# Patient Record
Sex: Female | Born: 1975 | Race: Black or African American | Hispanic: No | Smoking: Former smoker
Health system: Southern US, Community
[De-identification: ages and names within clinical notes are randomized; demographics above are authoritative.]

## PROBLEM LIST (undated history)

## (undated) ENCOUNTER — Inpatient Hospital Stay (HOSPITAL_COMMUNITY): Payer: Medicare Other

## (undated) ENCOUNTER — Emergency Department (HOSPITAL_COMMUNITY): Admission: EM | Payer: Medicare Other | Source: Home / Self Care

## (undated) DIAGNOSIS — I2510 Atherosclerotic heart disease of native coronary artery without angina pectoris: Secondary | ICD-10-CM

## (undated) DIAGNOSIS — M2550 Pain in unspecified joint: Secondary | ICD-10-CM

## (undated) DIAGNOSIS — E876 Hypokalemia: Secondary | ICD-10-CM

## (undated) DIAGNOSIS — K5900 Constipation, unspecified: Secondary | ICD-10-CM

## (undated) DIAGNOSIS — K829 Disease of gallbladder, unspecified: Secondary | ICD-10-CM

## (undated) DIAGNOSIS — M549 Dorsalgia, unspecified: Secondary | ICD-10-CM

## (undated) DIAGNOSIS — F1010 Alcohol abuse, uncomplicated: Secondary | ICD-10-CM

## (undated) DIAGNOSIS — R51 Headache: Secondary | ICD-10-CM

## (undated) DIAGNOSIS — F329 Major depressive disorder, single episode, unspecified: Secondary | ICD-10-CM

## (undated) DIAGNOSIS — G709 Myoneural disorder, unspecified: Secondary | ICD-10-CM

## (undated) DIAGNOSIS — R0602 Shortness of breath: Secondary | ICD-10-CM

## (undated) DIAGNOSIS — F32A Depression, unspecified: Secondary | ICD-10-CM

## (undated) DIAGNOSIS — R600 Localized edema: Secondary | ICD-10-CM

## (undated) DIAGNOSIS — F419 Anxiety disorder, unspecified: Secondary | ICD-10-CM

## (undated) DIAGNOSIS — Z9049 Acquired absence of other specified parts of digestive tract: Secondary | ICD-10-CM

## (undated) DIAGNOSIS — D649 Anemia, unspecified: Secondary | ICD-10-CM

## (undated) DIAGNOSIS — Z90721 Acquired absence of ovaries, unilateral: Secondary | ICD-10-CM

## (undated) HISTORY — DX: Atherosclerotic heart disease of native coronary artery without angina pectoris: I25.10

## (undated) HISTORY — DX: Constipation, unspecified: K59.00

## (undated) HISTORY — DX: Shortness of breath: R06.02

## (undated) HISTORY — DX: Pain in unspecified joint: M25.50

## (undated) HISTORY — DX: Disease of gallbladder, unspecified: K82.9

## (undated) HISTORY — DX: Localized edema: R60.0

## (undated) HISTORY — DX: Hypokalemia: E87.6

## (undated) HISTORY — DX: Dorsalgia, unspecified: M54.9

---
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
## 2001-02-02 ENCOUNTER — Encounter: Payer: Self-pay | Admitting: Internal Medicine

## 2001-02-03 ENCOUNTER — Emergency Department (HOSPITAL_COMMUNITY): Admission: EM | Admit: 2001-02-03 | Discharge: 2001-02-03 | Payer: Self-pay | Admitting: Emergency Medicine

## 2001-02-04 ENCOUNTER — Emergency Department (HOSPITAL_COMMUNITY): Admission: EM | Admit: 2001-02-04 | Discharge: 2001-02-04 | Payer: Self-pay | Admitting: Emergency Medicine

## 2001-02-20 ENCOUNTER — Emergency Department (HOSPITAL_COMMUNITY): Admission: EM | Admit: 2001-02-20 | Discharge: 2001-02-20 | Payer: Self-pay | Admitting: Emergency Medicine

## 2001-03-03 ENCOUNTER — Emergency Department (HOSPITAL_COMMUNITY): Admission: EM | Admit: 2001-03-03 | Discharge: 2001-03-03 | Payer: Self-pay | Admitting: *Deleted

## 2001-03-18 ENCOUNTER — Emergency Department (HOSPITAL_COMMUNITY): Admission: EM | Admit: 2001-03-18 | Discharge: 2001-03-18 | Payer: Self-pay | Admitting: Emergency Medicine

## 2001-03-18 ENCOUNTER — Encounter: Payer: Self-pay | Admitting: Emergency Medicine

## 2001-05-25 ENCOUNTER — Emergency Department (HOSPITAL_COMMUNITY): Admission: EM | Admit: 2001-05-25 | Discharge: 2001-05-25 | Payer: Self-pay | Admitting: Emergency Medicine

## 2001-09-15 ENCOUNTER — Emergency Department (HOSPITAL_COMMUNITY): Admission: EM | Admit: 2001-09-15 | Discharge: 2001-09-16 | Payer: Self-pay | Admitting: Unknown Physician Specialty

## 2002-07-30 ENCOUNTER — Emergency Department (HOSPITAL_COMMUNITY): Admission: EM | Admit: 2002-07-30 | Discharge: 2002-07-30 | Payer: Self-pay | Admitting: Emergency Medicine

## 2006-04-28 ENCOUNTER — Inpatient Hospital Stay (HOSPITAL_COMMUNITY): Admission: AD | Admit: 2006-04-28 | Discharge: 2006-04-28 | Payer: Self-pay | Admitting: Family Medicine

## 2006-05-01 ENCOUNTER — Inpatient Hospital Stay (HOSPITAL_COMMUNITY): Admission: AD | Admit: 2006-05-01 | Discharge: 2006-05-01 | Payer: Self-pay | Admitting: Obstetrics and Gynecology

## 2006-05-27 ENCOUNTER — Ambulatory Visit (HOSPITAL_COMMUNITY): Admission: RE | Admit: 2006-05-27 | Discharge: 2006-05-27 | Payer: Self-pay | Admitting: Obstetrics and Gynecology

## 2006-10-16 ENCOUNTER — Inpatient Hospital Stay (HOSPITAL_COMMUNITY): Admission: AD | Admit: 2006-10-16 | Discharge: 2006-10-16 | Payer: Self-pay | Admitting: Obstetrics and Gynecology

## 2006-12-15 ENCOUNTER — Inpatient Hospital Stay (HOSPITAL_COMMUNITY): Admission: AD | Admit: 2006-12-15 | Discharge: 2006-12-15 | Payer: Self-pay | Admitting: Obstetrics and Gynecology

## 2006-12-31 ENCOUNTER — Inpatient Hospital Stay (HOSPITAL_COMMUNITY): Admission: AD | Admit: 2006-12-31 | Discharge: 2007-01-03 | Payer: Self-pay | Admitting: Obstetrics and Gynecology

## 2007-01-01 ENCOUNTER — Encounter (INDEPENDENT_AMBULATORY_CARE_PROVIDER_SITE_OTHER): Payer: Self-pay | Admitting: Obstetrics and Gynecology

## 2007-05-26 ENCOUNTER — Emergency Department (HOSPITAL_COMMUNITY): Admission: EM | Admit: 2007-05-26 | Discharge: 2007-05-26 | Payer: Self-pay | Admitting: Emergency Medicine

## 2010-05-22 NOTE — Discharge Summary (Signed)
[name], Freeman         ACCOUNT NO.:  [id number]   MEDICAL RECORD NO.:  [id number]          PATIENT TYPE:  INP   LOCATION:  9148                          [facility]ILITY:  [facility]   PHYSICIAN:  Hal Morales, M.D.DATE OF BIRTH:  Apr 06, 1975   DATE OF ADMISSION:  12/31/2006  DATE OF DISCHARGE:  01/03/2007                               DISCHARGE SUMMARY   ATTENDING PHYSICIAN:  Hal Morales, M.D.   ADMITTING DIAGNOSES:  1. Intrauterine pregnancy at term.  2. Premature rupture of membranes at term.  3. Early labor.  4. Previous cesarean with desire for VBAC (vaginal birth after      cesarean).   DISCHARGE DIAGNOSES:  1. Intrauterine pregnancy at term.  2. Premature rupture of membranes at term.  3. Early labor.  4. Previous cesarean with desire for VBAC (vaginal birth after      cesarean).  5. Recurrent decelerations.   PROCEDURE:  Repeat cesarean section.   SURGEON:  Osborn Coho, M.D.   FIRST ASSISTANT:  Cam Hai, C.N.M.   HOSPITAL COURSE:  Ms. Reichenbach is a 35 year old gravida 3, para 2-0-  0-2 who was admitted at 39-2/7 weeks for evaluation of leaking vaginal  fluid.  She reported some irregular painful contractions.  She reports  positive fetal movement.  Her pregnancy has been followed by the [facility] OB/GY[facility] MD service and has been remarkable for  1. PENICILLIN allergy, 2) group B strep negative, 3) smoker, 4) father      of the baby with hemophilia, 5) Rh negative, 6) previous C-section      with a single layer closure and subsequent VBAC as well as desire      for VBAC with this pregnancy.   Upon admission her cervix was 2 cm, 75% effaced, vertex -2, speculum  exam shows positive pooling, positive Nitrazine and positive ferning.  She was afebrile.  Her vital signs were stable and fetal heart rate was  reactive upon admission.  An IUPC was placed at 9:30 p.m.  Pitocin was  started.  The patient was requesting an epidural at 10 p.m.   Vaginal  exam was unchanged at 53 cm, 80%, vertex -2.  At 11:45 p.m. the patient  was having recurrent decelerations.  Pitocin was stopped, at that point  vaginal exam was unchanged.  C-section was recommended to the patient  due to minimal cervical change combined with recurrent fetal  decelerations.  The patient agreed to cesarean section and she was  prepared for the operating room.  The infant was a viable female with  Apgars of 8 at one minute and 9 at five minutes, weight was 7 pounds 4  ounces.  Anesthesia was failed epidural and then a spinal.  Infant was  doing well and taken to the full-term nursery.  The patient tolerated  the remainder of the cesarean section well and was taken to the recovery  room.  By postop day #1 her vital signs were stable.  She was afebrile.  Hemoglobin was 10.0 and had been 12.0 preoperatively.  Her physical exam  was within normal limits.  She had a CT scan  on December 25 to evaluate  for ureteral injury which was negative so no further radiology studies  were needed in that area.  By postop day #2 she continued to do well.  Her vital signs were stable.  She was breast and bottle feeding and that  was going well.  She was desiring early discharge.  She was deemed to  have received full benefit of her hospital stay and was discharged home.   DISCHARGE INSTRUCTIONS:  Per [facility] handout.   DISCHARGE MEDICATIONS:  Motrin 600 mg one p.o. q.6 h p.r.n. pain, Tylox  one to two p.o. q.3-4 h p.r.n. pain, prenatal vitamin one p.o. daily.   DISCHARGE FOLLOWUP:  Will occur at [facility] in 6 weeks or  as needed.      Cam Hai, C.N.M.      Hal Morales, M.D.  Electronically Signed    KS/MEDQ  D:  01/03/2007  T:  01/04/2007  Job:  [id number]

## 2010-05-22 NOTE — Op Note (Signed)
Haley Freeman, Haley Freeman         ACCOUNT NO.:  [id number]   MEDICAL RECORD NO.:  [id number]          PATIENT TYPE:  INP   LOCATION:  9148                          [facility]   PHYSICIAN:  Osborn Coho, M.D.   DATE OF BIRTH:  Nov 22, 1975   DATE OF PROCEDURE:  DATE OF DISCHARGE:                               OPERATIVE REPORT   PREOPERATIVE DIAGNOSES:  1. 39-3/7 weeks.  2. Spontaneous rupture of membranes.  3. Early labor.  4. Vaginal birth after cesarean section (VBAC).  5. Recurrent decelerations.   POSTOPERATIVE DIAGNOSES:  1. 39-3/7 weeks.  2. Spontaneous rupture of membranes.  3. Early labor.  4. Vaginal birth after cesarean section (VBAC).  5. Recurrent decelerations.   PROCEDURE:  Repeat cesarean section.   ANESTHESIA:  Failed epidural with subsequent placement of a spinal.   SURGEON:  Osborn Coho, M.D.   ASSISTANT:  Cam Hai, C.N.M.   FLUIDS:  2300 mL.   URINE OUTPUT:  200 mL.   ESTIMATED BLOOD LOSS:  700 mL.   COMPLICATIONS:  None.   FINDINGS:  Live female infant with Apgars of 8 at one minute and 9 at  five minutes, weighing 7 pounds 4 ounces.   PROCEDURE:  The patient was taken to the operating room, after risks,  benefits, and alternatives were reviewed with the patient.  The patient  verbalized understanding, consent signed and witnessed.  The patient was  attempted to achieve a surgical level via the epidural which was  unsuccessful on the patient's left and anesthesiologist decided to place  a spinal with good success.   The patient was then prepped and draped in the normal sterile fashion in  the supine position.  A Pfannenstiel skin incision was made at the site  of the prior scar and carried down to the underlying layer of fascia  with the scalpel.  The fascia was excised bilaterally in the midline  with the Bovie and extended bilaterally with the Mayo scissors.  Kocher  clamps were placed on the superior aspect of the fascial  incision and  the rectus muscle excised from the fascia.  The same was done on the  inferior aspect of the fascial incision.  The rectus muscle was  separated in the midline and the peritoneum entered bluntly and extended  manually.  The bladder blade was placed and bladder flap created with  the Metzenbaum scissors.  However, multiple adhesions were noted of the  bladder to the right aspect of the uterine incision, which was then made  and extended bluntly.  The infant was delivered without difficulty.  Cord clamped and cut and clindamycin administered.  The placenta was  removed via fundal massage and uterus cleared of all clots and debris.  The uterine incision was repaired with #0 Vicryl in a running locked  fashion.  A second imbricating layer was performed.  Of note, the right  aspect of the uterine incision was noted to extend deeply down beneath  the bladder which was repaired trying to avoid the bladder as much as  possible, but secondary to the scar tissue, it was somewhat difficult.  I plan  to obtain an IVP or CT scan to evaluate ureteral patency.   The intra-abdominal cavity was copiously irrigated and uterine incision  noted to be hemostatic.  The peritoneum was then repaired with 2-0  chromic in a running fashion.  The fascia was repaired with #0 Vicryl in  a running fashion.  The subcutaneous tissue was irrigated and made  hemostatic with the Bovie.  Three interrupted stitches of 2-0 plain were  used in subcutaneous tissue to reapproximate the tissue.  The skin was  closed with 3-0 Monocryl via a subcuticular stitch..  Half-inch Steri-  Strips were applied with Benzoin.  Sponge, lap and needle counts were  correct.   The patient tolerated the procedure well and was awaiting transfer to  the recovery room in good condition.      Osborn Coho, M.D.  Electronically Signed     AR/MEDQ  D:  01/01/2007  T:  01/01/2007  Job:  [id number]

## 2010-05-22 NOTE — H&P (Signed)
Haley Freeman, AHART         ACCOUNT NO.:  [id number]   MEDICAL RECORD NO.:  [id number]          PATIENT TYPE:  INP   LOCATION:  9148                          [facility]   PHYSICIAN:  Osborn Coho, M.D.   DATE OF BIRTH:  March 13, 1975   DATE OF ADMISSION:  12/31/2006  DATE OF DISCHARGE:                              HISTORY & PHYSICAL   This is a 35 year old gravida 3, para 2-0-2 at 41 and 2/7 weeks who  presents for evaluation of leaking since 3:00 p.m.  She has some  irregular contractions that are painful.  There is positive fetal  movement.  Pregnancy has been followed by Dr. Su Hilt and remarkable  for:  1. Penicillin allergy.  2. Group B strep negative.  3. Smoker.  4. Father of the baby with hemophilia.  5. Rh negative.  6. Previous C-section with a single layer closure with subsequent      VBAC.   ALLERGIES:  PENICILLIN CAUSES HER THROAT CLOSE UP.   OBSTETRIC HISTORY:  She had a C-section in 1999 of a female infant at 57  weeks' gestation weighing 7 pounds 11 ounces for nonreassuring fetal  heart rate.  She had a VBAC in 2001 of a female infant at 4 weeks'  gestation weighing 7 pounds 9 ounces with no complications.   MEDICAL HISTORY:  Remarkable for history of anemia, Rh negative,  childhood varicella, and smoking cigarettes.   SURGICAL HISTORY:  Remarkable for C-section times 1.   FAMILY HISTORY:  Remarkable for heart disease and hypertension in  multiple members and aunt with diabetes.  Mother with thyroid  dysfunction.   GENETIC HISTORY:  Remarkable for daughters born with heart murmurs that  went away.  Father of the baby with hemophilia and family history of  twins.   SOCIAL HISTORY:  The patient is married to [employer] who is involved  and supportive.  She is of the [employer].  She denies any alcohol or  drug use, but does smoke cigarettes, half pack per day.   PRENATAL LABORATORY:  Hemoglobin 11.8, platelets 262.  Hepatitis  negative,  rubella immune, RPR nonreactive.  Blood type O negative,  antibody screen negative, sickle cell negative, HIV negative.  Pap test  normal.  Gonorrhea, Chlamydia negative.   HISTORY OF CURRENT PREGNANCY:  The patient entered care at 7 weeks.  She  had a first trimester ultrasound to confirm dates.  She had genetic  counseling done.  She declined first trimester screen and cystic  fibrosis testing.  She had an 18-week ultrasound that was normal and  other one at 22 weeks to complete the anatomy screen that was normal.  She had a death in the family and was treated with some Xanax for a  short while.  She signed a VBAC consent form.  She had an ultrasound at  33 weeks showing 67 percentile growth and she presents today in early  labor.   OBJECTIVE DATA:  VITAL SIGNS:  Stable, afebrile.  HEENT: Within normal limits.  Thyroid normal, not enlarged.  CHEST: Clear to auscultation.  HEART: Regular rate and rhythm.  ABDOMEN:  Gravid, vertex.  Leopold's, EFM shows reactive fetal heart  rate with irregular contractions.  Cervix is 2, 75 and -2, vertex.  Speculum exam was positive for ruptured membranes.  EXTREMITIES:  Within normal limits.   ASSESSMENT:  1. Intrauterine pregnancy at 69 and 2/7 weeks.  2. Premature rupture of membranes at term.  3. Early labor.   PLAN:  1. Admit per Dr. Su Hilt.  2. The patient elects to proceed with trial of labor and VBAC.      Marie L. Williams, C.N.M.      Osborn Coho, M.D.  Electronically Signed    MLW/MEDQ  D:  12/31/2006  T:  01/01/2007  Job:  [id number]

## 2010-07-03 ENCOUNTER — Emergency Department (HOSPITAL_COMMUNITY)
Admission: EM | Admit: 2010-07-03 | Discharge: 2010-07-03 | Disposition: A | Discharge status: Home / Self Care | Payer: Self-pay | Attending: Emergency Medicine | Admitting: Emergency Medicine

## 2010-07-03 DIAGNOSIS — T148 Other injury of unspecified body region: Secondary | ICD-10-CM | POA: Insufficient documentation

## 2010-07-03 DIAGNOSIS — W57XXXA Bitten or stung by nonvenomous insect and other nonvenomous arthropods, initial encounter: Secondary | ICD-10-CM | POA: Insufficient documentation

## 2010-07-03 DIAGNOSIS — Y92009 Unspecified place in unspecified non-institutional (private) residence as the place of occurrence of the external cause: Secondary | ICD-10-CM | POA: Insufficient documentation

## 2010-07-03 DIAGNOSIS — L299 Pruritus, unspecified: Secondary | ICD-10-CM | POA: Insufficient documentation

## 2010-07-03 DIAGNOSIS — Z79899 Other long term (current) drug therapy: Secondary | ICD-10-CM | POA: Insufficient documentation

## 2010-10-03 LAB — POCT URINALYSIS DIP (DEVICE)
Hgb urine dipstick: NEGATIVE
Ketones, ur: NEGATIVE
Protein, ur: NEGATIVE
Specific Gravity, Urine: 1.025
Urobilinogen, UA: 0.2
pH: 6

## 2010-10-12 LAB — RH IMMUNE GLOB WKUP(>/=20WKS)(NOT WOMEN'S HOSP): Fetal Screen: NEGATIVE

## 2010-10-12 LAB — CBC
HCT: 35.6 — ABNORMAL LOW
Hemoglobin: 10 — ABNORMAL LOW
MCHC: 33.6
MCHC: 33.7
MCV: 85.8
Platelets: 162
Platelets: 199
RDW: 14.5

## 2010-10-12 LAB — RPR: RPR Ser Ql: NONREACTIVE

## 2010-10-15 LAB — URINALYSIS, ROUTINE W REFLEX MICROSCOPIC
Bilirubin Urine: NEGATIVE
Glucose, UA: NEGATIVE
Ketones, ur: NEGATIVE
Nitrite: NEGATIVE
Specific Gravity, Urine: 1.01
pH: 6

## 2010-10-18 LAB — RH IMMUNE GLOBULIN WORKUP (NOT WOMEN'S HOSP): Antibody Screen: NEGATIVE

## 2010-11-18 ENCOUNTER — Ambulatory Visit (HOSPITAL_COMMUNITY)
Admission: EM | Admit: 2010-11-18 | Discharge: 2010-11-18 | Disposition: A | Discharge status: Home / Self Care | Payer: No Typology Code available for payment source | Source: Home / Self Care | Attending: Psychiatry | Admitting: Psychiatry

## 2010-11-18 ENCOUNTER — Encounter: Payer: Self-pay | Admitting: Emergency Medicine

## 2010-11-18 ENCOUNTER — Emergency Department (HOSPITAL_COMMUNITY)
Admission: EM | Admit: 2010-11-18 | Discharge: 2010-11-19 | Disposition: A | Discharge status: Other Acute Inpatient Hospital | Payer: No Typology Code available for payment source | Source: Home / Self Care | Attending: Emergency Medicine | Admitting: Emergency Medicine

## 2010-11-18 DIAGNOSIS — F1020 Alcohol dependence, uncomplicated: Secondary | ICD-10-CM

## 2010-11-18 HISTORY — DX: Anxiety disorder, unspecified: F41.9

## 2010-11-18 HISTORY — DX: Alcohol abuse, uncomplicated: F10.10

## 2010-11-18 LAB — DIFFERENTIAL
Basophils Absolute: 0.1 10*3/uL (ref 0.0–0.1)
Basophils Relative: 1 % (ref 0–1)
Eosinophils Absolute: 0.4 10*3/uL (ref 0.0–0.7)
Eosinophils Relative: 4 % (ref 0–5)
Lymphs Abs: 5.2 10*3/uL — ABNORMAL HIGH (ref 0.7–4.0)
Neutrophils Relative %: 44 % (ref 43–77)

## 2010-11-18 LAB — CBC
MCH: 24.9 pg — ABNORMAL LOW (ref 26.0–34.0)
MCHC: 32.7 g/dL (ref 30.0–36.0)
MCV: 76.3 fL — ABNORMAL LOW (ref 78.0–100.0)
Platelets: 301 10*3/uL (ref 150–400)
RDW: 20.1 % — ABNORMAL HIGH (ref 11.5–15.5)

## 2010-11-18 LAB — BASIC METABOLIC PANEL
CO2: 22 mEq/L (ref 19–32)
Calcium: 9.3 mg/dL (ref 8.4–10.5)
Creatinine, Ser: 0.82 mg/dL (ref 0.50–1.10)
GFR calc non Af Amer: >90 mL/min (ref >90)
Glucose, Bld: 89 mg/dL (ref 70–99)

## 2010-11-18 LAB — RAPID URINE DRUG SCREEN, HOSP PERFORMED: Amphetamines: NOT DETECTED

## 2010-11-18 LAB — ETHANOL: Alcohol, Ethyl (B): 157 mg/dL — ABNORMAL HIGH (ref 0–11)

## 2010-11-18 MED ORDER — THERA M PLUS PO TABS: 1.0000 | ORAL_TABLET | Freq: Every day | ORAL | Status: DC

## 2010-11-18 MED ORDER — THIAMINE HCL 100 MG/ML IJ SOLN: 100.0000 mg | Freq: Every day | INTRAMUSCULAR | Status: DC

## 2010-11-18 MED ORDER — FOLIC ACID 1 MG PO TABS: 1.0000 mg | ORAL_TABLET | Freq: Every day | ORAL | Status: DC

## 2010-11-18 MED ORDER — VITAMIN B-1 100 MG PO TABS: 100.0000 mg | ORAL_TABLET | Freq: Every day | ORAL | Status: DC

## 2010-11-18 MED ORDER — SODIUM CHLORIDE 0.9 % IV BOLUS (SEPSIS)
500.0000 mL | Freq: Once | INTRAVENOUS | Status: AC
  Administered 2010-11-18: 500 mL via INTRAVENOUS

## 2010-11-18 MED ORDER — LORAZEPAM 1 MG PO TABS
1.0000 mg | ORAL_TABLET | Freq: Four times a day (QID) | ORAL | Status: DC | PRN
  Administered 2010-11-18: 1 mg via ORAL
  Filled 2010-11-18: qty 1

## 2010-11-18 MED ORDER — LORAZEPAM 2 MG/ML IJ SOLN: 1.0000 mg | Freq: Four times a day (QID) | INTRAMUSCULAR | Status: DC | PRN

## 2010-11-18 MED ORDER — THIAMINE HCL 100 MG/ML IJ SOLN
100.0000 mg | Freq: Once | INTRAMUSCULAR | Status: AC
  Administered 2010-11-18: 200 mg via INTRAVENOUS
  Filled 2010-11-18 (×2): qty 2

## 2010-11-18 MED ORDER — LORAZEPAM 2 MG/ML IJ SOLN
1.0000 mg | Freq: Once | INTRAMUSCULAR | Status: AC
  Administered 2010-11-18: 1 mg via INTRAVENOUS
  Filled 2010-11-18: qty 1

## 2010-11-18 NOTE — ED Notes (Signed)
Pt provided a happy meal 
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
## 2010-11-18 NOTE — ED Provider Notes (Signed)
History     CSN: [id number] Arrival date & time: 11/18/2010  6:50 PM   First MD Initiated Contact with Patient 11/18/10 1910      Chief Complaint  Patient presents with  . Panic Attack    (Consider location/radiation/quality/duration/timing/severity/associated sxs/prior treatment) HPI  Past Medical History  Diagnosis Date  . Alcohol abuse   . Anxiety     History reviewed. No pertinent past surgical history.  History reviewed. No pertinent family history.  History  Substance Use Topics  . Smoking status: Current Everyday Smoker  . Smokeless tobacco: Not on file  . Alcohol Use: Yes     Here for detox    OB History    Grav Para Term Preterm Abortions TAB SAB Ect Mult Living                  Review of Systems  Allergies  Penicillins  Home Medications  No current outpatient prescriptions on file.  BP 125/84  Pulse 4  Temp(Src) 98 F (36.7 C) (Oral)  Resp 20  SpO2 97%  LMP 11/11/2010  Physical Exam  ED Course  Procedures (including critical care time)  Labs Reviewed  CBC - Abnormal; Notable for the following:    WBC 11.1 (*)    MCV 76.3 (*)    MCH 24.9 (*)    RDW 20.1 (*)    All other components within normal limits  DIFFERENTIAL - Abnormal; Notable for the following:    Lymphocytes Relative 47 (*)    Lymphs Abs 5.2 (*)    All other components within normal limits  ETHANOL - Abnormal; Notable for the following:    Alcohol, Ethyl (B) 157 (*)    All other components within normal limits  BASIC METABOLIC PANEL  URINE RAPID DRUG SCREEN (HOSP PERFORMED)   No results found.   No diagnosis found.    MDM  Care assumed from Dr. Radford Pax.  Labs returned and were okay.  ACT team was consulted for possible alcohol detox.  Patient resting comfortable.  Was given ativan.          Geoffery Lyons, MD 11/19/10 [phone]

## 2010-11-18 NOTE — ED Notes (Signed)
Last ETOH intake today unknown time.

## 2010-11-18 NOTE — ED Notes (Signed)
Pt with visible tremors, with hands by side. EDP Delo informed. Ativan and ciwa protocol ordered.

## 2010-11-18 NOTE — ED Provider Notes (Signed)
History     CSN: [id number] Arrival date & time: 11/18/2010  6:50 PM   First MD Initiated Contact with Patient 11/18/10 1910      Chief Complaint  Patient presents with  . Panic Attack    (Consider location/radiation/quality/duration/timing/severity/associated sxs/prior treatment) HPI Patient presents to emergency room with request for detox from alcohol.  Patient has never undergone detox before.  Patient is a heavy drinker and drinks daily.  Her last drink was this afternoon around 3 to 4:00.  Patient has no known history of DTs but has had shakes before.  Patient has underlying history of anxiety.  Patient been diagnosed in the past with depression and has had thoughts of suicide before but that's been going on for many years and there is nothing new her she has no definitive plan.  Patient has no specific physical complaints. Past Medical History  Diagnosis Date  . Alcohol abuse   . Anxiety     History reviewed. No pertinent past surgical history.  History reviewed. No pertinent family history.  History  Substance Use Topics  . Smoking status: Current Everyday Smoker  . Smokeless tobacco: Not on file  . Alcohol Use: Yes     Here for detox    OB History    Grav Para Term Preterm Abortions TAB SAB Ect Mult Living                  Review of Systems  All other systems reviewed and are negative.    Allergies  Penicillins  Home Medications  No current outpatient prescriptions on file.  BP 125/84  Pulse 4  Temp(Src) 98 F (36.7 C) (Oral)  Resp 20  SpO2 97%  LMP 11/11/2010  Physical Exam  Nursing note and vitals reviewed. Constitutional: She is oriented to person, place, and time. She appears well-developed and well-nourished. No distress.  HENT:  Head: Normocephalic and atraumatic.  Eyes: Pupils are equal, round, and reactive to light.  Neck: Normal range of motion.  Cardiovascular: Normal rate and intact distal pulses.   Pulmonary/Chest: No  respiratory distress.  Abdominal: Normal appearance. She exhibits no distension.  Musculoskeletal: Normal range of motion.  Neurological: She is alert and oriented to person, place, and time. No cranial nerve deficit.  Skin: Skin is warm and dry. No rash noted.  Psychiatric: She has a normal mood and affect. Her behavior is normal.    ED Course  Procedures (including critical care time)  Labs Reviewed  CBC - Abnormal; Notable for the following:    WBC 11.1 (*)    MCV 76.3 (*)    MCH 24.9 (*)    RDW 20.1 (*)    All other components within normal limits  DIFFERENTIAL - Abnormal; Notable for the following:    Lymphocytes Relative 47 (*)    Lymphs Abs 5.2 (*)    All other components within normal limits  ETHANOL - Abnormal; Notable for the following:    Alcohol, Ethyl (B) 157 (*)    All other components within normal limits  BASIC METABOLIC PANEL  URINE RAPID DRUG SCREEN (HOSP PERFORMED)   No results found.   1. Alcoholism /alcohol abuse       MDM       Plan is for IV fluids, Ativan, thiamine.  Will refer to behavioral health for admission for detox.  Nelia Shi, MD 11/18/10 [phone]

## 2010-11-18 NOTE — ED Notes (Signed)
Patient is requesting DETOX from ETOH, last drink was today.  Patient states that she did drink beer and liquor, amount "a lot."  Patient "states that she needs help because she has three kids at home that she needs to take care of."  Patient is restless, anxious, but very cooperative, makes eye contact.

## 2010-11-18 NOTE — ED Notes (Signed)
Pt reports here for detox from alcohol. Pt also reports having a panic attack that began the moment she decided to detox.

## 2010-11-18 NOTE — ED Notes (Signed)
Comfort measures, warm blanket provided

## 2010-11-19 ENCOUNTER — Inpatient Hospital Stay (HOSPITAL_COMMUNITY)
Admission: EM | Admit: 2010-11-19 | Discharge: 2010-11-22 | DRG: 897 | Disposition: A | Discharge status: Home / Self Care | Payer: No Typology Code available for payment source | Source: Ambulatory Visit | Attending: Psychiatry | Admitting: Psychiatry

## 2010-11-19 ENCOUNTER — Telehealth (HOSPITAL_COMMUNITY): Payer: Self-pay | Admitting: Licensed Clinical Social Worker

## 2010-11-19 ENCOUNTER — Encounter (HOSPITAL_COMMUNITY): Payer: Self-pay | Admitting: *Deleted

## 2010-11-19 DIAGNOSIS — F1994 Other psychoactive substance use, unspecified with psychoactive substance-induced mood disorder: Secondary | ICD-10-CM

## 2010-11-19 DIAGNOSIS — F410 Panic disorder [episodic paroxysmal anxiety] without agoraphobia: Secondary | ICD-10-CM

## 2010-11-19 DIAGNOSIS — G4700 Insomnia, unspecified: Secondary | ICD-10-CM

## 2010-11-19 DIAGNOSIS — R45851 Suicidal ideations: Secondary | ICD-10-CM

## 2010-11-19 DIAGNOSIS — F1020 Alcohol dependence, uncomplicated: Principal | ICD-10-CM

## 2010-11-19 DIAGNOSIS — F411 Generalized anxiety disorder: Secondary | ICD-10-CM

## 2010-11-19 DIAGNOSIS — F1014 Alcohol abuse with alcohol-induced mood disorder: Secondary | ICD-10-CM

## 2010-11-19 DIAGNOSIS — F4310 Post-traumatic stress disorder, unspecified: Secondary | ICD-10-CM

## 2010-11-19 DIAGNOSIS — G8929 Other chronic pain: Secondary | ICD-10-CM

## 2010-11-19 DIAGNOSIS — F329 Major depressive disorder, single episode, unspecified: Secondary | ICD-10-CM

## 2010-11-19 HISTORY — DX: Depression, unspecified: F32.A

## 2010-11-19 HISTORY — DX: Headache: R51

## 2010-11-19 HISTORY — DX: Myoneural disorder, unspecified: G70.9

## 2010-11-19 HISTORY — DX: Major depressive disorder, single episode, unspecified: F32.9

## 2010-11-19 MED ORDER — THERA M PLUS PO TABS
1.0000 | ORAL_TABLET | Freq: Every day | ORAL | Status: DC
  Administered 2010-11-19 – 2010-11-22 (×4): 1 via ORAL
  Filled 2010-11-19 (×5): qty 1

## 2010-11-19 MED ORDER — CHLORDIAZEPOXIDE HCL 25 MG PO CAPS
25.0000 mg | ORAL_CAPSULE | ORAL | Status: AC
  Administered 2010-11-21 (×2): 25 mg via ORAL
  Filled 2010-11-19 (×2): qty 1

## 2010-11-19 MED ORDER — MAGNESIUM HYDROXIDE 400 MG/5ML PO SUSP: 30.0000 mL | Freq: Every day | ORAL | Status: DC | PRN

## 2010-11-19 MED ORDER — ONDANSETRON 4 MG PO TBDP
4.0000 mg | ORAL_TABLET | Freq: Four times a day (QID) | ORAL | Status: AC | PRN
  Administered 2010-11-19: 4 mg via ORAL
  Filled 2010-11-19: qty 1

## 2010-11-19 MED ORDER — NICOTINE 21 MG/24HR TD PT24
21.0000 mg | MEDICATED_PATCH | Freq: Every day | TRANSDERMAL | Status: DC
  Administered 2010-11-19 – 2010-11-21 (×3): 21 mg via TRANSDERMAL
  Filled 2010-11-19 (×4): qty 1

## 2010-11-19 MED ORDER — LOPERAMIDE HCL 2 MG PO CAPS
2.0000 mg | ORAL_CAPSULE | ORAL | Status: AC | PRN
  Administered 2010-11-20: 4 mg via ORAL

## 2010-11-19 MED ORDER — CHLORDIAZEPOXIDE HCL 25 MG PO CAPS
25.0000 mg | ORAL_CAPSULE | Freq: Every day | ORAL | Status: AC
  Administered 2010-11-22: 25 mg via ORAL
  Filled 2010-11-19: qty 1

## 2010-11-19 MED ORDER — CHLORDIAZEPOXIDE HCL 25 MG PO CAPS
50.0000 mg | ORAL_CAPSULE | Freq: Once | ORAL | Status: AC
  Administered 2010-11-19: 50 mg via ORAL
  Filled 2010-11-19: qty 2

## 2010-11-19 MED ORDER — CHLORDIAZEPOXIDE HCL 25 MG PO CAPS: 25.0000 mg | ORAL_CAPSULE | Freq: Four times a day (QID) | ORAL | Status: AC | PRN

## 2010-11-19 MED ORDER — VITAMIN B-1 100 MG PO TABS
100.0000 mg | ORAL_TABLET | Freq: Every day | ORAL | Status: DC
  Administered 2010-11-20 – 2010-11-22 (×3): 100 mg via ORAL
  Filled 2010-11-19 (×5): qty 1

## 2010-11-19 MED ORDER — ALUM & MAG HYDROXIDE-SIMETH 200-200-20 MG/5ML PO SUSP
30.0000 mL | ORAL | Status: DC | PRN
  Administered 2010-11-21: 30 mL via ORAL

## 2010-11-19 MED ORDER — MIRTAZAPINE 15 MG PO TABS
7.5000 mg | ORAL_TABLET | Freq: Every day | ORAL | Status: DC
  Administered 2010-11-19 – 2010-11-20 (×2): 7.5 mg via ORAL
  Filled 2010-11-19: qty 0.5
  Filled 2010-11-19: qty 1
  Filled 2010-11-19: qty 0.5
  Filled 2010-11-19: qty 1

## 2010-11-19 MED ORDER — ACETAMINOPHEN 325 MG PO TABS: 650.0000 mg | ORAL_TABLET | Freq: Four times a day (QID) | ORAL | Status: DC | PRN

## 2010-11-19 MED ORDER — CHLORDIAZEPOXIDE HCL 25 MG PO CAPS
25.0000 mg | ORAL_CAPSULE | Freq: Four times a day (QID) | ORAL | Status: AC
  Administered 2010-11-19 (×4): 25 mg via ORAL
  Filled 2010-11-19 (×4): qty 1

## 2010-11-19 MED ORDER — CHLORPROMAZINE HCL 25 MG PO TABS
25.0000 mg | ORAL_TABLET | Freq: Three times a day (TID) | ORAL | Status: DC
  Administered 2010-11-19 – 2010-11-21 (×7): 25 mg via ORAL
  Filled 2010-11-19 (×8): qty 1

## 2010-11-19 MED ORDER — HYDROXYZINE PAMOATE 25 MG PO CAPS
25.0000 mg | ORAL_CAPSULE | Freq: Four times a day (QID) | ORAL | Status: AC | PRN
  Administered 2010-11-19: 25 mg via ORAL

## 2010-11-19 MED ORDER — CHLORDIAZEPOXIDE HCL 25 MG PO CAPS
25.0000 mg | ORAL_CAPSULE | Freq: Three times a day (TID) | ORAL | Status: AC
  Administered 2010-11-20 (×3): 25 mg via ORAL
  Filled 2010-11-19 (×3): qty 1

## 2010-11-19 NOTE — Progress Notes (Signed)
[facility] Group Notes:  (Counselor/Nursing/MHT/Case Management/Adjunct)  11/19/2010 2:26 PM  Type of Therapy:  Group Therapy  Participation Level:  Active  Participation Quality:  Appropriate, Attentive and Sharing  Affect:  Appropriate  Cognitive:  Appropriate  Insight:  Good  Engagement in Group:  Good  Engagement in Therapy:  Good  Modes of Intervention:  Problem-solving, Support and exploration   Summary of Progress/Problems: Pt was able to share that she came to the hospital for SI and alcohol abuse. Pt shared she is unhappy with life, pt stated her sister and the lifestyle in the home combined with unemployment are triggers. Pt stated she felt that the walls are coming in on her and that feeling causes her to drink, pt states she hates the cycle. Pt is fearful she will lose the love and respect of her children.   Purcell Nails 11/19/2010, 2:26 PM

## 2010-11-19 NOTE — Progress Notes (Signed)
[facility] Group Notes:  (Counselor/Nursing/MHT/Case Management/Adjunct)  11/19/2010 4:37 PM  Type of Therapy:  Group Therapy  Participation Level:  Active  Participation Quality:  Appropriate, Sharing and Supportive  Affect:  Appropriate  Cognitive:  Alert and Appropriate  Insight:  Good  Engagement in Group:  Good  Engagement in Therapy:  Good  Modes of Intervention:  Problem-solving, Role-play and Support  Summary of Progress/Problems: Haley Freeman was able to share that her motivations for change are out of the love she has for her children and how she wants them to grow up better then she did, pt feels guilt about her oldest daughter often taking care of her younger siblings because she is too drunk to do it herself. Pt expressed interest in moving out of sisters house with her three children 3,11, and 12 to a treatment center, pt says the place she lives now is full of drinking and drugs. Pt expressed motivation for turning her life around.    Purcell Nails 11/19/2010, 4:37 PM

## 2010-11-19 NOTE — Progress Notes (Signed)
SW met with pt individually on this date.  Pt was open with sharing reason for entering the hospital.  Pt states that she lives with a house full of alcoholics.  Pt states that she resides with her sister and her fiance and her three children, ages 9, 80 and 7.  Pt states that she recently moved from [place] to be closer to family and is getting unemployment for her source of income.  Pt states that she moved in with her sister from [place].  Pt states that she has depression, anxiety and insomnia and couldn't get her medication here so she resorted to coping with alcohol.  Pt states that her depression and anxiety are currently at a 9.  Pt states she is SI but contracts for safety.  Pt states that she doesn't want to drink anymore and can't go back to living at that home because she will drink again; pt states there is alcohol everywhere.  Pt requesting a shelter that will accept her and the children.  SW will assess for appropriate referrals.  Pt states that she is unable to do long term treatment because she needs to care for her children.  Pt was pleasant and appears motivated to stay sober and improve her mood.  Pt states she plans to attend all groups to get as much as she can while here.    Haley Freeman, LCSWA 11/19/2010  3:45 PM
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
## 2010-11-19 NOTE — Progress Notes (Signed)
Suicide Risk Assessment  Discharge Assessment     Demographic factors:    Current Mental Status:    Risk Reduction Factors:     CLINICAL FACTORS:   Severe Anxiety and/or Agitation Panic Attacks Depression:   Comorbid alcohol abuse/dependence Alcohol/Substance Abuse/Dependencies Chronic Pain Previous Psychiatric Diagnoses and Treatments  COGNITIVE FEATURES THAT CONTRIBUTE TO RISK:      SUICIDE RISK:   Mild:  Suicidal ideation of limited frequency, intensity, duration, and specificity.  There are no identifiable plans, no associated intent, mild dysphoria and related symptoms, good self-control (both objective and subjective assessment), few other risk factors, and identifiable protective factors, including available and accessible social support. Patient notes off and on suicidal thoughts and thoughts that everybody seems to be out to hurt her. Patient denies homicidal ideation, hallucinations, illusions, or delusions. Patient engages with good eye contact, is able to focus adequately in a one to one setting, and has clear goal directed thoughts. Patient speaks with a natural conversational volume, rate, and tone. Anxiety was reported at 7 on a scale of 1 the least and 10 the most. Depression was reported at 10 on the same scale. Patient is oriented times 4, recent and remote memory intact. Judgement: able to see that she needs help Insight: able to see that she needs help Plan: Medical detox from alcohol with low dose Librium, try Thorazine for anxiety management, and Remeron for insomnia ELOS: 3 to 5 days.    PLAN OF CARE:  Orson Aloe 11/19/2010, 4:09 PM

## 2010-11-19 NOTE — Progress Notes (Signed)
Patient is nauseated this am.  She was admitted for ETOH abuse.  Patient stated that she had been taking xanax and she was substituting the ETOH for same.  She is having withdrawal symptoms of stomach upset, shakiness, and anxiety.  Patient was given loading dose of librium 50 mg and scheduled 25 mg.  She was given prn zofran for nausea.  She denies any SI/HI/AVH.  Patient has been lying in bed this am due to her symptoms.  Continue to assess and maintain safety.  Patient is pleasant and cooperative.  She is alert and oriented; good eye contact.  Her thought process is intact.  She did report that she wanted to go home to be with her children, but she knew that she needed to be here.  Patient to remain on CIWA per librium protocol.

## 2010-11-19 NOTE — ED Notes (Signed)
Accepted by Dr. Dan Humphreys at [facility] - dispo set  Vida Roller, MD 11/19/10 [phone]

## 2010-11-19 NOTE — Progress Notes (Signed)
  Pt was in the hallway at the time of this assessment. Pt denies any SI/HI,AVH, or pain. Pt c/o having a big bowel prior to this assessment, that was diarrhea. Pt c/o insomnia often. Pt did not request any additional PRN's. Support was given, Q15 checks continue, pt remains safe on the unit.

## 2010-11-19 NOTE — ED Notes (Signed)
Pt A&Ox4, no neuro deficits

## 2010-11-20 ENCOUNTER — Encounter (HOSPITAL_COMMUNITY): Payer: Self-pay | Admitting: Psychiatry

## 2010-11-20 DIAGNOSIS — F1014 Alcohol abuse with alcohol-induced mood disorder: Secondary | ICD-10-CM | POA: Diagnosis present
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
## 2010-11-20 NOTE — Progress Notes (Signed)
Recreation Therapy Group Note  Date: 11/20/2010         Time: 1000       Group Topic/Focus: Patient invited to participate in animal assisted therapy. Pets as a coping skill and responsibility were discussed.   Participation Level: Did Not Attend  Participation Quality: Not Applicable  Affect: Not Applicable  Cognitive: Not Applicable   Additional Comments: Patient declined pet therapy.

## 2010-11-20 NOTE — Progress Notes (Signed)
[facility] Group Notes:  (Counselor/Nursing/MHT/Case Management/Adjunct)  11/20/2010 12:57 PM  Type of Therapy:  Group Therapy  Participation Level:  Active  Participation Quality:  Appropriate, Attentive, Sharing and Supportive  Affect:  Appropriate  Cognitive:  Appropriate  Insight:  Good  Engagement in Group:  Good  Engagement in Therapy:  Good  Modes of Intervention:  Problem-solving, Support and exploration  Summary of Progress/Problems: Dezire  Was able to share that she does not buy into labels as she is not just a dx but a person and feels she can beat her addiction. Pt also shared she was feeling conflicted as she wants to be here but she also worries about her children- however she feels her aunt will care for them. Pt able to provide support and positive feedback to others.   Purcell Nails 11/20/2010, 12:57 PM

## 2010-11-20 NOTE — Progress Notes (Signed)
Patient is attending groups.  She has good eye contact; thought processes intact.  Patient is interested in [facility] for support after discharge.  Patient has seven children in the home.  Patient's sister and her children live in the home also.  The patient's 35 yo daughter has been responsible for taking care of the younger children when the patient has been abusing substances.  The patient's home is not appropriate for the children as there are others living in the home that are using drugs.  Patient states that she is staying in her sister's home and she wants to move out and gain her sobriety.  Patient remains on librium protocol and has minimal withdrawal symptoms.  She rates her depression as a 7 and her hopelessness as a 5.  Continue to assess and maintain safety.

## 2010-11-20 NOTE — Tx Team (Signed)
Interdisciplinary Treatment Plan Update (Adult)  Date:  11/20/2010  Time Reviewed:  11:31 AM   Progress in Treatment: Attending groups: Yes Participating in groups:  Yes Taking medication as prescribed: Yes Tolerating medication:  Yes Family/Significant othe contact made:  Counselor assessing for appropriate contact Patient understands diagnosis:  Yes Discussing patient identified problems/goals with staff:  Yes Medical problems stabilized or resolved:  Yes Denies suicidal/homicidal ideation: Yes Issues/concerns per patient self-inventory:  None identified Other: N/A  New problem(s) identified: None Identified  Reason for Continuation of Hospitalization: Anxiety Depression Medication stabilization Suicidal ideation Withdrawal symptoms  Interventions implemented related to continuation of hospitalization: mood stabilization, medication monitoring and adjustment, group therapy and psycho education, safety checks q 15 mins  Additional comments: N/A  Estimated length of stay: 3-5 days  Discharge Plan: Pt will go to AA meetings and outpt for med management and therapy, pt requesting placement with kids due to unsafe environment  New goal(s): N/A  Review of initial/current patient goals per problem list:   1.  Goal(s): Substance Use  Met:  No  Target date: by discharge  As evidenced by: complete detox protocol and refer to treatment  2.  Goal (s): Depression  Met:  No  Target date: by discharge  As evidenced by: Reduce depression from a 10 to a 3  3.  Goal(s): Anxiety   Met:  No  Target date: by discharge  As evidenced by: Reduce anxiety from a 10 to a 3  4.  Goal(s):  Met:  No  Target date: by discharge  As evidenced by:   Attendees: Patient:     Family:     Physician:  Orson Aloe, MD 11/20/2010 11:31 AM   Nursing:   Joslyn Devon, RN 11/20/2010 11:31 AM   Case Manager:  Reyes Ivan, LCSWA 11/20/2010  11:31 AM   Counselor:  Ronda Fairly, LCSWA 11/20/2010  11:31 AM   Other:  Richardo Hanks, RN 11/20/2010 11:31 AM   Other:     Other:     Other:      Scribe for Treatment Team:   Carmina Miller, 11/20/2010 11:31 AM

## 2010-11-20 NOTE — Progress Notes (Signed)
  Pt is laying in bed resting, with eyes closed. Respirations are even and unlabored.

## 2010-11-20 NOTE — Progress Notes (Signed)
  Psychiatric Admission Assessment Adult  Patient Identification:  Haley Freeman Date of Evaluation: November 13th, 2012   History of Present Illness:: Here to get help with alcohol , drinking too much, self medicating. Having panic attacks. Began to have withdrawal symptoms, tremors, after drinking and felt she needed help. Suicidal thinking no intent. Wants to get well for her children. Drinking up to 1/2 gallon of Vodka.  Having poor sleep and no changes in appetite. Has been off Zoloft and Seroquel and Xanax off medications  since June.  Past Psychiatric History: Diagnosis: Post Partum Depression, Panic disorder  Hospitalizations:  Outpatient Care:      Suicidal Attempts: hx of cutting  Violent Behaviors: none   Past Medical History:     Past Medical History  Diagnosis Date  . Alcohol abuse   . Anxiety   . Headache   . Neuromuscular disorder   . Depression        Past Surgical History  Procedure Date  . Cesarean section '99 and '08    x 2    Allergies:  Allergies  Allergen Reactions  . Penicillins     Swells throat    Current Medications:  Prior to Admission medications   Not on File    Social History:    reports that she has been smoking.  She does not have any smokeless tobacco history on file. She reports that she drinks about 3 ounces of alcohol per week. She reports that she uses illicit drugs (Other-see comments). has 3 children ages 110, 79 and 3 that are with her sister. Currently separated and unemployed. Prior work was Lawyer.   Family History:    No family history on file.  Mental Status Examination/Evaluation: Objective:  Appearance: Disheveled  Eye Contact::  Fair  Speech:  Normal Rate  Volume:  Normal  Mood:    Affect:  Congruent  Thought Process:  Logical  Orientation:  Full    Suicidal Thoughts:  No  Homicidal Thoughts:  No  Judgement:  Intact  Insight:  Good  Psychomotor Activity:  Normal              AXIS I  Alcohol abuse. Depressive Disorder  AXIS II deferred  AXIS III Eczema, Bronchitis  AXIS IV Problems with work, financial   AXIS V 40     Treatment Plan Summary: Monitor withdrawal symptoms, assess comorbitites, assess motivation for rehab and sobriety AHLUWALIA,SHAMSHER S 11/6/201211:40 AM

## 2010-11-20 NOTE — Progress Notes (Signed)
[facility] MD Progress Note  11/20/2010 5:05 PM  Diagnosis:  Axis I: Substance Abuse  ADL's:  Intact  Sleep:  Yes,  AEB:  Appetite:  Yes,  AEB:  Suicidal Ideation:   Plan:  No  Intent:  No  Means:  No  Homicidal Ideation:   Plan:  No  Intent:  No  Means:  No  AEB (as evidenced by):  Mental Status: General Appearance Haley Freeman:  Casual Eye Contact:  Good Motor Behavior:  Normal Speech:  Normal Level of Consciousness:  Alert Mood:  Anxious Affect:  Tearful Anxiety Level:  Severe Thought Process:  Coherent Thought Content:  WNL Perception:  Normal Judgment:  Good Insight:  Present Cognition:  Orientation time, place and person Sleep:  Number of Hours: 6.25   Vital Signs:Blood pressure 103/71, pulse 93, temperature 98.3 F (36.8 C), temperature source Oral, resp. rate 16, height 5\' 8"  (1.727 m), weight 115.667 kg (255 lb), last menstrual period 11/11/2010.  Lab Results:  Results for orders placed during the hospital encounter of 11/18/10 (from the past 48 hour(s))  BASIC METABOLIC PANEL     Status: Normal   Collection Time   11/18/10  7:14 PM      Component Value Range Comment   Sodium 140  135 - 145 (mEq/L)    Potassium 3.8  3.5 - 5.1 (mEq/L)    Chloride 104  96 - 112 (mEq/L)    CO2 22  19 - 32 (mEq/L)    Glucose, Bld 89  70 - 99 (mg/dL)    BUN 9  6 - 23 (mg/dL)    Creatinine, Ser 9.56  0.50 - 1.10 (mg/dL)    Calcium 9.3  8.4 - 10.5 (mg/dL)    GFR calc non Af Amer >90  >90 (mL/min)    GFR calc Af Amer >90  >90 (mL/min)   CBC     Status: Abnormal   Collection Time   11/18/10  7:14 PM      Component Value Range Comment   WBC 11.1 (*) 4.0 - 10.5 (K/uL)    RBC 4.93  3.87 - 5.11 (MIL/uL)    Hemoglobin 12.3  12.0 - 15.0 (g/dL)    HCT 38.7  56.4 - 33.2 (%)    MCV 76.3 (*) 78.0 - 100.0 (fL)    MCH 24.9 (*) 26.0 - 34.0 (pg)    MCHC 32.7  30.0 - 36.0 (g/dL)    RDW 95.1 (*) 88.4 - 15.5 (%)    Platelets 301  150 - 400 (K/uL)   DIFFERENTIAL     Status: Abnormal     Collection Time   11/18/10  7:14 PM      Component Value Range Comment   Neutrophils Relative 44  43 - 77 (%)    Neutro Abs 4.8  1.7 - 7.7 (K/uL)    Lymphocytes Relative 47 (*) 12 - 46 (%)    Lymphs Abs 5.2 (*) 0.7 - 4.0 (K/uL)    Monocytes Relative 4  3 - 12 (%)    Monocytes Absolute 0.5  0.1 - 1.0 (K/uL)    Eosinophils Relative 4  0 - 5 (%)    Eosinophils Absolute 0.4  0.0 - 0.7 (K/uL)    Basophils Relative 1  0 - 1 (%)    Basophils Absolute 0.1  0.0 - 0.1 (K/uL)   ETHANOL     Status: Abnormal   Collection Time   11/18/10  7:14 PM      Component Value Range  Comment   Alcohol, Ethyl (B) 157 (*) 0 - 11 (mg/dL)   URINE RAPID DRUG SCREEN (HOSP PERFORMED)     Status: Normal   Collection Time   11/18/10  8:43 PM      Component Value Range Comment   Opiates NONE DETECTED  NONE DETECTED     Cocaine NONE DETECTED  NONE DETECTED     Benzodiazepines NONE DETECTED  NONE DETECTED     Amphetamines NONE DETECTED  NONE DETECTED     Tetrahydrocannabinol NONE DETECTED  NONE DETECTED     Barbiturates NONE DETECTED  NONE DETECTED      Physical Findings: AIMS:  , ,  ,  ,    CIWA:  CIWA-Ar Total: 0  COWS:     Treatment Plan Summary: Daily contact with patient to assess and evaluate symptoms and progress in treatment Medication management  Plan: Continue to monitor progress and encourage to enter residential tx.  Haley Freeman 11/20/2010, 5:05 PM

## 2010-11-21 MED ORDER — MIRTAZAPINE 15 MG PO TABS
15.0000 mg | ORAL_TABLET | Freq: Every day | ORAL | Status: DC
  Administered 2010-11-21: 15 mg via ORAL
  Filled 2010-11-21 (×2): qty 14
  Filled 2010-11-21: qty 1

## 2010-11-21 MED ORDER — QUETIAPINE FUMARATE 100 MG PO TABS
100.0000 mg | ORAL_TABLET | Freq: Every day | ORAL | Status: DC
  Administered 2010-11-21: 100 mg via ORAL
  Filled 2010-11-21 (×3): qty 1

## 2010-11-21 MED ORDER — QUETIAPINE FUMARATE 100 MG PO TABS
100.0000 mg | ORAL_TABLET | Freq: Every evening | ORAL | Status: DC | PRN
  Administered 2010-11-21: 100 mg via ORAL
  Filled 2010-11-21: qty 1

## 2010-11-21 NOTE — Progress Notes (Signed)
RN Shift 2300-0700 Pt is asleep at this time. Will interact with pt in morning prior to shift ending.
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
## 2010-11-21 NOTE — Progress Notes (Signed)
Pt attended discharge planning group this morning and actively participated. Pt stated that she was "doing better" today.  She was happy with the phone call with her brother yesterday, and reported him being a good support for her upon discharge.  Upon discharge she will live with her brother until she gets back on track.  PT rated her depression level at a two and her anxiety level at a zero.  Pt denies SI/HI.  SW will refer pt to mental health services to follow up with for treatment upon discharge. Haley Freeman Claudette Laws, [place] 11/21/2010  1:48 PM
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
## 2010-11-21 NOTE — Progress Notes (Signed)
  Patient polite and appropriate. Attended group this evening. Patient had concern this evening regarding remeron for sleep. Patient is scheduled remeron 7.5mg  PO at HS but states that night before, with taking medication, that she had a hard time falling asleep (though sleep record shows that patient slept 6.25hours). Nurse talked to patient and explained that she could call the MD on call but then patient said that she would rather take the scheduled medication tonight to see if she would have a better nights rest and then speak to the MD or NP tomorrow on the unit. Patient remains safe while on the unit.

## 2010-11-21 NOTE — Progress Notes (Signed)
[facility] Adult Inpatient Family/Significant Other Suicide Prevention Education  Suicide Prevention Education:  Education Completed; by Bekka's aunt Ms. Orpah Melter [phone] has been identified by the patient as the family member/significant other with whom the patient will be residing, and identified as the person(s) who will aid the patient in the event of a mental health crisis (suicidal ideations/suicide attempt).  With written consent from the patient, the family member/significant other has been provided the following suicide prevention education, prior to the and/or following the discharge of the patient.  The suicide prevention education provided includes the following:  Suicide risk factors  Suicide prevention and interventions  National Suicide Hotline telephone number  [facility] assessment telephone number  [facility] Emergency Assistance 911  [facility] and/or Residential Mobile Crisis Unit telephone number  Request made of family/significant other to:  Remove weapons (e.g., guns, rifles, knives), all items previously/currently identified as safety concern.    Remove drugs/medications (over-the-counter, prescriptions, illicit drugs), all items previously/currently identified as a safety concern.  The family member/significant other verbalizes understanding of the suicide prevention education information provided.  The family member/significant other agrees to remove the items of safety concern listed above. Aunt reports no weapons in home. Haley Freeman 11/21/2010, 4:20 PM

## 2010-11-21 NOTE — Progress Notes (Signed)
[facility] Group Notes:  (Counselor/Nursing/MHT/Case Management/Adjunct)  11/21/2010 5:39 PM  Type of Therapy:  Group Therapy  Participation Level:  Active  Participation Quality:  Appropriate  Affect:  Appropriate  Cognitive:  Appropriate  Insight:  Good  Engagement in Group:  Good  Engagement in Therapy:  Good  Modes of Intervention:  Problem-solving, Support and exploration  Summary of Progress/Problems: Pt states that when she is drinking she based her choices on emotions and is able to rational when sober. Pt able to provide support for others.   Haley Freeman 11/21/2010, 5:39 PM

## 2010-11-22 DIAGNOSIS — F10988 Alcohol use, unspecified with other alcohol-induced disorder: Secondary | ICD-10-CM

## 2010-11-22 MED ORDER — MIRTAZAPINE 15 MG PO TABS: 15.0000 mg | ORAL_TABLET | Freq: Every day | ORAL | Status: AC

## 2010-11-22 MED ORDER — NICOTINE 14 MG/24HR TD PT24
14.0000 mg | MEDICATED_PATCH | Freq: Every day | TRANSDERMAL | Status: DC
  Administered 2010-11-22: 14 mg via TRANSDERMAL
  Filled 2010-11-22 (×3): qty 1

## 2010-11-22 MED ORDER — QUETIAPINE FUMARATE 200 MG PO TABS: 200.0000 mg | ORAL_TABLET | Freq: Every day | ORAL | Status: AC

## 2010-11-22 NOTE — Progress Notes (Signed)
  See Discharge Suicide Assessment Note and Discharge Summary  Haley Freeman slept well last night on 200mg  of Seroquel.  She is completely detoxed.  She is in full contact with reality and plans to get home to get her 35yo daughter her immunizations so she can get her enrolled in preschool.    Feels medications are helping her.    Requests discharge today with followup as outpatient at [facility].
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
## 2010-11-22 NOTE — Progress Notes (Signed)
[facility] Group Notes:  (Counselor/Nursing/MHT/Case Management/Adjunct)  11/22/2010 3:22 PM  Type of Therapy:  Group therapy  Participation Level:  Active  Participation Quality:  Appropriate, Attentive, Sharing and Supportive  Affect:  Appropriate  Cognitive:  Appropriate  Insight:  Good  Engagement in Group:  Good  Engagement in Therapy:  Good  Modes of Intervention:  Problem-solving, Support and exploration  Summary of Progress/Problems: Pt was able to share what balance would look like in her life, stating it includes not allowing the bottom to fall out of things and taking charge of her life. Pt able to provide others with support and provide positive feedback.  Purcell Nails 11/22/2010, 3:22 PM

## 2010-11-22 NOTE — Progress Notes (Signed)
Suicide Risk Assessment  Discharge Assessment     Demographic factors:    Current Mental Status:    Risk Reduction Factors:     CLINICAL FACTORS:   Alcohol/Substance Abuse/Dependencies  COGNITIVE FEATURES THAT CONTRIBUTE TO RISK:  NONE    SUICIDE RISK:   Minimal: No identifiable suicidal ideation.  Patients presenting with no risk factors but with morbid ruminations; may be classified as minimal risk based on the severity of the depressive symptoms  Patient denies suicidal or homicidal ideation, hallucinations, illusions, or delusions.  Patient engages with good eye contact, is able to focus adequately in a one to one setting, and has clear goal directed thoughts.  Patient speaks with a natural conversational volume, rate, and tone.  Anxiety was reported at 1 on a scale of 1 the least and 10 the most.  Depression was reported also at 1 on the same scale.  Patient is oriented times 4, recent and remote memory intact.  Judgment: Improved from admission  Insight: Improved from admission  PLAN OF CARE: SEE DISCHARGE SUMMARY  Venkat Ankney S 11/22/2010, 11:11 AM
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
## 2010-11-22 NOTE — Discharge Summary (Signed)
Pt attended discharge planning group this morning and actively participated.  Pt stated that she felt great this morning and was ready to discharge today.  Pt does have transportation and will be living with her brother temporarily upon discharge.  Pt stated that she was not experiencing any symptoms related to depression or anxiety.  Pt denies SI/HI.  CM provided pt with information regarding her follow-up appt at [facility] as a walk-in tomorrow morning for medication management and the SAIOP program to help with the substance abuse issues.  CM ensured that pt was provided with a list of AA meetings in [place] and a AA book. Loraina Stauffer Claudette Laws, [place] 11/22/2010  2:39 PM

## 2010-11-22 NOTE — Progress Notes (Signed)
  Pt questioned the writer as to whether she was leaving tomorrow and whether or not she would be getting samples. Writer informed the pt that she would read the info. Informed the pt to ask the provider for needed meds. Support and encouragement  Were offered.

## 2010-11-23 NOTE — Progress Notes (Signed)
Patient Discharge Instructions:  Dictated admission note faxed, Date faxed:  11/23/2010 D/C instructions faxed, Date faxed:  11/23/2010 D/C Summary faxed, Date faxed:  11/23/2010 Med. Rec. Form faxed, Date faxed:  11/23/2010  Wandra Scot, 11/23/2010, 3:39 PM

## 2013-09-21 ENCOUNTER — Ambulatory Visit: Payer: Medicaid Other | Admitting: Obstetrics

## 2014-05-11 DIAGNOSIS — F319 Bipolar disorder, unspecified: Secondary | ICD-10-CM | POA: Diagnosis not present

## 2014-07-20 DIAGNOSIS — F319 Bipolar disorder, unspecified: Secondary | ICD-10-CM | POA: Diagnosis not present

## 2014-09-22 ENCOUNTER — Encounter (HOSPITAL_COMMUNITY): Payer: Self-pay | Admitting: General Practice

## 2014-09-22 ENCOUNTER — Emergency Department (HOSPITAL_COMMUNITY)
Admission: EM | Admit: 2014-09-22 | Discharge: 2014-09-22 | Disposition: A | Discharge status: Home / Self Care | Payer: Medicare Other | Attending: Emergency Medicine | Admitting: Emergency Medicine

## 2014-09-22 DIAGNOSIS — X58XXXA Exposure to other specified factors, initial encounter: Secondary | ICD-10-CM | POA: Insufficient documentation

## 2014-09-22 DIAGNOSIS — Z8669 Personal history of other diseases of the nervous system and sense organs: Secondary | ICD-10-CM | POA: Insufficient documentation

## 2014-09-22 DIAGNOSIS — Y929 Unspecified place or not applicable: Secondary | ICD-10-CM | POA: Diagnosis not present

## 2014-09-22 DIAGNOSIS — S39012A Strain of muscle, fascia and tendon of lower back, initial encounter: Secondary | ICD-10-CM | POA: Insufficient documentation

## 2014-09-22 DIAGNOSIS — Z8659 Personal history of other mental and behavioral disorders: Secondary | ICD-10-CM | POA: Diagnosis not present

## 2014-09-22 DIAGNOSIS — M549 Dorsalgia, unspecified: Secondary | ICD-10-CM | POA: Diagnosis present

## 2014-09-22 DIAGNOSIS — Y999 Unspecified external cause status: Secondary | ICD-10-CM | POA: Insufficient documentation

## 2014-09-22 DIAGNOSIS — M546 Pain in thoracic spine: Secondary | ICD-10-CM | POA: Diagnosis not present

## 2014-09-22 DIAGNOSIS — Y9389 Activity, other specified: Secondary | ICD-10-CM | POA: Insufficient documentation

## 2014-09-22 DIAGNOSIS — Z720 Tobacco use: Secondary | ICD-10-CM | POA: Diagnosis not present

## 2014-09-22 DIAGNOSIS — S29012A Strain of muscle and tendon of back wall of thorax, initial encounter: Secondary | ICD-10-CM | POA: Diagnosis not present

## 2014-09-22 DIAGNOSIS — Z880 Allergy status to penicillin: Secondary | ICD-10-CM | POA: Insufficient documentation

## 2014-09-22 DIAGNOSIS — Z3202 Encounter for pregnancy test, result negative: Secondary | ICD-10-CM | POA: Insufficient documentation

## 2014-09-22 DIAGNOSIS — T148XXA Other injury of unspecified body region, initial encounter: Secondary | ICD-10-CM

## 2014-09-22 LAB — URINALYSIS, ROUTINE W REFLEX MICROSCOPIC
BILIRUBIN URINE: NEGATIVE
Glucose, UA: NEGATIVE mg/dL
Hgb urine dipstick: NEGATIVE
KETONES UR: NEGATIVE mg/dL
NITRITE: NEGATIVE
Protein, ur: NEGATIVE mg/dL
SPECIFIC GRAVITY, URINE: 1.023 (ref 1.005–1.030)
UROBILINOGEN UA: 0.2 mg/dL (ref 0.0–1.0)
pH: 5 (ref 5.0–8.0)

## 2014-09-22 LAB — PREGNANCY, URINE: PREG TEST UR: NEGATIVE

## 2014-09-22 LAB — URINE MICROSCOPIC-ADD ON

## 2014-09-22 MED ORDER — CYCLOBENZAPRINE HCL 5 MG PO TABS: 5.0000 mg | ORAL_TABLET | Freq: Two times a day (BID) | ORAL | Status: DC | PRN

## 2014-09-22 MED ORDER — HYDROCODONE-ACETAMINOPHEN 5-325 MG PO TABS: 2.0000 | ORAL_TABLET | ORAL | Status: DC | PRN

## 2014-09-22 MED ORDER — CYCLOBENZAPRINE HCL 10 MG PO TABS
5.0000 mg | ORAL_TABLET | Freq: Once | ORAL | Status: AC
  Administered 2014-09-22: 5 mg via ORAL
  Filled 2014-09-22: qty 1

## 2014-09-22 NOTE — ED Notes (Signed)
Pt complaining of back pain that started this morning, after cleaning. Pt reporting that her "back locked up on the left side". Pt denies any urinary symptoms, N/V/D, fever and chills. Pt rating pain a 5/10.

## 2014-09-22 NOTE — ED Provider Notes (Signed)
CSN: [id number]     Arrival date & time 09/22/14  5364 History   First MD Initiated Contact with Patient 09/22/14 [phone]     Chief Complaint  Patient presents with  . Back Pain     (Consider location/radiation/quality/duration/timing/severity/associated sxs/prior Treatment) HPI Comments: Pt comes in with c/o left sided back pain that started this morning. She states that she was getting something out of the cabinet when the symptoms started. Denies numbness, incontinenc or weakness. No recent fall. Does have history of back injury about 4 years ago. States that it worse with movement. She took some hydrocodone with some relief.   The history is provided by the patient. No language interpreter was used.    Past Medical History  Diagnosis Date  . Alcohol abuse   . Anxiety   . Headache   . Neuromuscular disorder   . Depression    Past Surgical History  Procedure Laterality Date  . Cesarean section  '99 and '08    x 2   No family history on file. Social History  Substance Use Topics  . Smoking status: Current Every Day Smoker -- 0.50 packs/day  . Smokeless tobacco: Not on file  . Alcohol Use: 3.0 oz/week    5 Cans of beer per week     Comment: Here for detox, drinking up to 1/2 gal vodka and cognac/ day   OB History    No data available     Review of Systems  All other systems reviewed and are negative.     Allergies  Penicillins  Home Medications   Prior to Admission medications   Not on File   There were no vitals taken for this visit. Physical Exam  Constitutional: She is oriented to person, place, and time. She appears well-developed and well-nourished.  Cardiovascular: Normal rate and regular rhythm.   Pulmonary/Chest: Effort normal and breath sounds normal.  Abdominal: Soft. Bowel sounds are normal. There is no tenderness.  Musculoskeletal:  Left lumbar and thoracic paraspinal tenderness. Moving all extremities without any problem. Good sensation and  strength to all extremities  Neurological: She is alert and oriented to person, place, and time. Coordination normal.  Skin: Skin is warm and dry.  Psychiatric: She has a normal mood and affect.  Nursing note and vitals reviewed.   ED Course  Procedures (including critical care time) Labs Review Labs Reviewed  URINALYSIS, ROUTINE W REFLEX MICROSCOPIC (NOT AT [facility]) - Abnormal; Notable for the following:    APPearance CLOUDY (*)    Leukocytes, UA TRACE (*)    All other components within normal limits  URINE MICROSCOPIC-ADD ON - Abnormal; Notable for the following:    Squamous Epithelial / LPF FEW (*)    Bacteria, UA FEW (*)    All other components within normal limits  PREGNANCY, URINE    Imaging Review No results found. I have personally reviewed and evaluated these images and lab results as part of my medical decision-making.   EKG Interpretation None      MDM   Final diagnoses:  Muscle strain    No uti. No red flag symptoms neurologically intact. Will treat with hydrocodone and flexeril    Glendell Docker, NP 09/22/14 2122  Dorie Rank, MD 09/22/14 [phone]

## 2014-09-22 NOTE — Discharge Instructions (Signed)

## 2014-09-30 DIAGNOSIS — M545 Low back pain: Secondary | ICD-10-CM | POA: Diagnosis not present

## 2014-10-10 DIAGNOSIS — Z1389 Encounter for screening for other disorder: Secondary | ICD-10-CM | POA: Diagnosis not present

## 2014-10-10 DIAGNOSIS — S335XXA Sprain of ligaments of lumbar spine, initial encounter: Secondary | ICD-10-CM | POA: Diagnosis not present

## 2014-10-10 DIAGNOSIS — Z124 Encounter for screening for malignant neoplasm of cervix: Secondary | ICD-10-CM | POA: Diagnosis not present

## 2014-10-10 DIAGNOSIS — Z6841 Body Mass Index (BMI) 40.0 and over, adult: Secondary | ICD-10-CM | POA: Diagnosis not present

## 2014-10-10 DIAGNOSIS — Z131 Encounter for screening for diabetes mellitus: Secondary | ICD-10-CM | POA: Diagnosis not present

## 2014-10-10 DIAGNOSIS — Z13220 Encounter for screening for lipoid disorders: Secondary | ICD-10-CM | POA: Diagnosis not present

## 2014-10-10 DIAGNOSIS — F4312 Post-traumatic stress disorder, chronic: Secondary | ICD-10-CM | POA: Diagnosis not present

## 2014-10-11 DIAGNOSIS — F319 Bipolar disorder, unspecified: Secondary | ICD-10-CM | POA: Diagnosis not present

## 2014-10-26 DIAGNOSIS — M545 Low back pain: Secondary | ICD-10-CM | POA: Diagnosis not present

## 2014-12-15 DIAGNOSIS — F4312 Post-traumatic stress disorder, chronic: Secondary | ICD-10-CM | POA: Diagnosis not present

## 2014-12-15 DIAGNOSIS — T753XXA Motion sickness, initial encounter: Secondary | ICD-10-CM | POA: Diagnosis not present

## 2014-12-15 DIAGNOSIS — Z6841 Body Mass Index (BMI) 40.0 and over, adult: Secondary | ICD-10-CM | POA: Diagnosis not present

## 2014-12-21 DIAGNOSIS — F319 Bipolar disorder, unspecified: Secondary | ICD-10-CM | POA: Diagnosis not present

## 2015-04-12 DIAGNOSIS — F319 Bipolar disorder, unspecified: Secondary | ICD-10-CM | POA: Diagnosis not present

## 2015-06-10 DIAGNOSIS — Z0000 Encounter for general adult medical examination without abnormal findings: Secondary | ICD-10-CM | POA: Diagnosis not present

## 2015-06-10 DIAGNOSIS — E782 Mixed hyperlipidemia: Secondary | ICD-10-CM | POA: Diagnosis not present

## 2015-06-10 DIAGNOSIS — Z79899 Other long term (current) drug therapy: Secondary | ICD-10-CM | POA: Diagnosis not present

## 2015-06-10 DIAGNOSIS — R5383 Other fatigue: Secondary | ICD-10-CM | POA: Diagnosis not present

## 2015-06-10 DIAGNOSIS — R0602 Shortness of breath: Secondary | ICD-10-CM | POA: Diagnosis not present

## 2015-06-10 DIAGNOSIS — Z1389 Encounter for screening for other disorder: Secondary | ICD-10-CM | POA: Diagnosis not present

## 2015-06-10 DIAGNOSIS — E559 Vitamin D deficiency, unspecified: Secondary | ICD-10-CM | POA: Diagnosis not present

## 2015-07-12 DIAGNOSIS — R635 Abnormal weight gain: Secondary | ICD-10-CM | POA: Diagnosis not present

## 2015-07-12 DIAGNOSIS — K5909 Other constipation: Secondary | ICD-10-CM | POA: Diagnosis not present

## 2015-07-12 LAB — HM PAP SMEAR

## 2015-07-12 LAB — RESULTS CONSOLE HPV: CHL HPV: NEGATIVE

## 2015-07-26 DIAGNOSIS — F319 Bipolar disorder, unspecified: Secondary | ICD-10-CM | POA: Diagnosis not present

## 2015-07-26 DIAGNOSIS — F4310 Post-traumatic stress disorder, unspecified: Secondary | ICD-10-CM | POA: Diagnosis not present

## 2015-08-17 DIAGNOSIS — R635 Abnormal weight gain: Secondary | ICD-10-CM | POA: Diagnosis not present

## 2015-09-28 DIAGNOSIS — R635 Abnormal weight gain: Secondary | ICD-10-CM | POA: Diagnosis not present

## 2015-11-02 DIAGNOSIS — F319 Bipolar disorder, unspecified: Secondary | ICD-10-CM | POA: Diagnosis not present

## 2015-11-02 DIAGNOSIS — F4310 Post-traumatic stress disorder, unspecified: Secondary | ICD-10-CM | POA: Diagnosis not present

## 2015-12-30 ENCOUNTER — Emergency Department (HOSPITAL_COMMUNITY): Payer: Medicare Other

## 2015-12-30 ENCOUNTER — Emergency Department (HOSPITAL_COMMUNITY)
Admission: EM | Admit: 2015-12-30 | Discharge: 2015-12-30 | Disposition: A | Discharge status: Home / Self Care | Payer: Medicare Other | Attending: Emergency Medicine | Admitting: Emergency Medicine

## 2015-12-30 ENCOUNTER — Encounter (HOSPITAL_COMMUNITY): Payer: Self-pay | Admitting: Emergency Medicine

## 2015-12-30 DIAGNOSIS — Z87891 Personal history of nicotine dependence: Secondary | ICD-10-CM | POA: Diagnosis not present

## 2015-12-30 DIAGNOSIS — Y999 Unspecified external cause status: Secondary | ICD-10-CM | POA: Insufficient documentation

## 2015-12-30 DIAGNOSIS — X509XXA Other and unspecified overexertion or strenuous movements or postures, initial encounter: Secondary | ICD-10-CM | POA: Diagnosis not present

## 2015-12-30 DIAGNOSIS — Z79899 Other long term (current) drug therapy: Secondary | ICD-10-CM | POA: Diagnosis not present

## 2015-12-30 DIAGNOSIS — Y9339 Activity, other involving climbing, rappelling and jumping off: Secondary | ICD-10-CM | POA: Insufficient documentation

## 2015-12-30 DIAGNOSIS — S8991XA Unspecified injury of right lower leg, initial encounter: Secondary | ICD-10-CM

## 2015-12-30 DIAGNOSIS — Y929 Unspecified place or not applicable: Secondary | ICD-10-CM | POA: Insufficient documentation

## 2015-12-30 DIAGNOSIS — M25561 Pain in right knee: Secondary | ICD-10-CM | POA: Diagnosis not present

## 2015-12-30 MED ORDER — IBUPROFEN 800 MG PO TABS: 800.0000 mg | ORAL_TABLET | Freq: Three times a day (TID) | ORAL | 0 refills | Status: DC | PRN

## 2015-12-30 MED ORDER — TRAMADOL HCL 50 MG PO TABS: 50.0000 mg | ORAL_TABLET | Freq: Four times a day (QID) | ORAL | 0 refills | Status: DC | PRN

## 2015-12-30 NOTE — Discharge Instructions (Signed)
Follow-up with your orthopedist.  Return here as needed. 

## 2015-12-30 NOTE — ED Triage Notes (Signed)
Pt states "i was exercising on Monday and it started hurting, and Tuesday I jumped out of bed and landed on it funny and its really hurting now." C/o R knee pain and swelling, ambulatory. In NAD.

## 2015-12-30 NOTE — Progress Notes (Signed)
Orthopedic Tech Progress Note Patient Details:  Haley Freeman Aug 20, 1975 [id number]  Ortho Devices Type of Ortho Device: Knee Immobilizer, Crutches Ortho Device/Splint Interventions: Application   [place] Pink 12/30/2015, 5:10 PM

## 2015-12-30 NOTE — ED Provider Notes (Signed)
[place] [facility] DEPT Provider Note   CSN: [id number] Arrival date & time: 12/30/15  1158  By signing my name below, I, Julien Nordmann, attest that this documentation has been prepared under the direction and in the presence of [employer], PA-C.  Electronically Signed: Julien Nordmann, ED Scribe. 12/30/15. 3:46 PM.    History   Chief Complaint Chief Complaint  Patient presents with  . Knee Pain   The history is provided by the patient. No language interpreter was used.   HPI Comments: Haley Freeman is a 40 y.o. female who presents to the Emergency Department complaining of moderate, gradual worsening, right knee pain s/p an injury that occurred 5 days ago. She describes the pain as a dull, numbing sensation. Pt has mild swelling around the area. Pt notes she was exercising on Monday when he knee began to hurt and she notes jumping out of bed on Tuesday and landed on the area "funny". She reports applying biofreeze to the area to relieve her pain without any relief. Pt is ambulatory. She does not have any other complaints.  Past Medical History:  Diagnosis Date  . Alcohol abuse   . Anxiety   . Depression   . Headache(784.0)   . Neuromuscular disorder ([place])     There are no active problems to display for this patient.   Past Surgical History:  Procedure Laterality Date  . CESAREAN SECTION  '99 and '08   x 2    OB History    No data available       Home Medications    Prior to Admission medications   Medication Sig Start Date End Date Taking? Authorizing Provider  cyclobenzaprine (FLEXERIL) 5 MG tablet Take 1 tablet (5 mg total) by mouth 2 (two) times daily as needed for muscle spasms. 09/22/14   Glendell Docker, NP  HYDROcodone-acetaminophen (NORCO/VICODIN) 5-325 MG per tablet Take 2 tablets by mouth every 4 (four) hours as needed. 09/22/14   Glendell Docker, NP    Family History No family history on file.  Social History Social History  Substance  Use Topics  . Smoking status: Former Smoker    Packs/day: 0.50  . Smokeless tobacco: Not on file  . Alcohol use No     Allergies   Penicillins   Review of Systems Review of Systems  A complete 10 system review of systems was obtained and all systems are negative except as noted in the HPI and PMH.   Physical Exam Updated Vital Signs BP 116/84   Pulse 101   Temp 98.6 F (37 C) (Oral)   Resp 16   Ht 5\' 8"  (1.727 m)   Wt (!) 308 lb 7 oz (139.9 kg)   LMP 12/25/2015   SpO2 98%   BMI 46.90 kg/m   Physical Exam  Constitutional: She is oriented to person, place, and time. She appears well-developed and well-nourished.  HENT:  Head: Normocephalic.  Eyes: EOM are normal.  Neck: Normal range of motion.  Pulmonary/Chest: Effort normal.  Abdominal: She exhibits no distension.  Musculoskeletal: Normal range of motion. She exhibits tenderness.  Tenderness to anterior aspect of right knee are not immediately is difficult to assess based on size.  If she has any joint instability  Neurological: She is alert and oriented to person, place, and time.  Psychiatric: She has a normal mood and affect.  Nursing note and vitals reviewed.    ED Treatments / Results  DIAGNOSTIC STUDIES: Oxygen Saturation is 98% on RA, normal  by my interpretation.  COORDINATION OF CARE:  3:37 PM Discussed treatment plan with pt at bedside and pt agreed to plan.  Labs (all labs ordered are listed, but only abnormal results are displayed) Labs Reviewed - No data to display  EKG  EKG Interpretation None       Radiology Dg Knee Complete 4 Views Right  Result Date: 12/30/2015 CLINICAL DATA:  Landed wrong on right knee with pain. EXAM: RIGHT KNEE - COMPLETE 4+ VIEW COMPARISON:  None. FINDINGS: No evidence of fracture, dislocation, or joint effusion. No evidence of arthropathy or other focal bone abnormality. Soft tissues are unremarkable. IMPRESSION: Negative. Electronically Signed   By: Marin Olp M.D.   On: 12/30/2015 13:35    Procedures Procedures (including critical care time)  Medications Ordered in ED Medications - No data to display   Initial Impression / Assessment and Plan / ED Course  I have reviewed the triage vital signs and the nursing notes.  Pertinent labs & imaging results that were available during my care of the patient were reviewed by me and considered in my medical decision making (see chart for details).  Clinical Course     Patient will be treated for a knee injury.  She will be given orthopedic follow-up.  Crutches and knee immobilizer, ice and elevate  Final Clinical Impressions(s) / ED Diagnoses   Final diagnoses:  None   I personally performed the services described in this documentation, which was scribed in my presence. The recorded information has been reviewed and is accurate.  New Prescriptions New Prescriptions   No medications on file     Dalia Heading, PA-C 12/31/15 [place], MD 01/03/16 0001

## 2016-01-23 DIAGNOSIS — F319 Bipolar disorder, unspecified: Secondary | ICD-10-CM | POA: Diagnosis not present

## 2016-03-14 ENCOUNTER — Encounter (HOSPITAL_COMMUNITY): Payer: Self-pay | Admitting: Emergency Medicine

## 2016-03-14 ENCOUNTER — Ambulatory Visit (HOSPITAL_COMMUNITY)
Admission: EM | Admit: 2016-03-14 | Discharge: 2016-03-14 | Disposition: A | Discharge status: Home / Self Care | Payer: Medicare Other

## 2016-03-14 DIAGNOSIS — M25561 Pain in right knee: Secondary | ICD-10-CM

## 2016-03-14 DIAGNOSIS — M76899 Other specified enthesopathies of unspecified lower limb, excluding foot: Secondary | ICD-10-CM

## 2016-03-14 MED ORDER — NAPROXEN 500 MG PO TABS: 500.0000 mg | ORAL_TABLET | Freq: Two times a day (BID) | ORAL | 0 refills | Status: DC

## 2016-03-14 NOTE — ED Triage Notes (Signed)
Here for right knee pain onset 3 months +++   Reports she was seen at [facility] ED for similar sx... Xray = normal  Pain increases w/activity and at the end of the day  Reports around [place] of 2017 she jumped up and heard a pop of right knee and since then, she's been having this pain  Steady gait... A&O x4... NAD

## 2016-04-18 DIAGNOSIS — F319 Bipolar disorder, unspecified: Secondary | ICD-10-CM | POA: Diagnosis not present

## 2016-08-04 DIAGNOSIS — Z23 Encounter for immunization: Secondary | ICD-10-CM | POA: Diagnosis not present

## 2016-11-06 DIAGNOSIS — F4310 Post-traumatic stress disorder, unspecified: Secondary | ICD-10-CM | POA: Diagnosis not present

## 2016-11-06 DIAGNOSIS — F319 Bipolar disorder, unspecified: Secondary | ICD-10-CM | POA: Diagnosis not present

## 2017-01-07 DIAGNOSIS — Z9049 Acquired absence of other specified parts of digestive tract: Secondary | ICD-10-CM

## 2017-01-07 HISTORY — DX: Acquired absence of other specified parts of digestive tract: Z90.49

## 2017-01-30 ENCOUNTER — Ambulatory Visit (HOSPITAL_COMMUNITY)
Admission: EM | Admit: 2017-01-30 | Discharge: 2017-01-30 | Disposition: A | Discharge status: Home / Self Care | Payer: Medicare Other | Attending: Family Medicine | Admitting: Family Medicine

## 2017-01-30 ENCOUNTER — Encounter (HOSPITAL_COMMUNITY): Payer: Self-pay | Admitting: Family Medicine

## 2017-01-30 DIAGNOSIS — M25561 Pain in right knee: Secondary | ICD-10-CM

## 2017-01-30 DIAGNOSIS — H6691 Otitis media, unspecified, right ear: Secondary | ICD-10-CM

## 2017-01-30 MED ORDER — IBUPROFEN 800 MG PO TABS
800.0000 mg | ORAL_TABLET | Freq: Three times a day (TID) | ORAL | 0 refills | Status: DC
Start: 2017-01-30 — End: 2017-08-22

## 2017-01-30 MED ORDER — AZITHROMYCIN 250 MG PO TABS: 250.0000 mg | ORAL_TABLET | Freq: Every day | ORAL | 0 refills | Status: DC

## 2017-01-30 NOTE — ED Provider Notes (Addendum)
[place]   [id number] 01/30/17 Arrival Time: 1006   SUBJECTIVE:  Haley Freeman is a 42 y.o. female who presents to the urgent care with complaint of right ear pain and congestion for four days and right lateral knee pain for 2 days.  She has no hearing loss.  Her ear pain began after 6 days of cough and congestion.  She fell two days ago, twisting right knee.  Now having acute intense pain with weight bearing or twisting knee.  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 Alcohol abuse   . Anxiety   . Depression   . Headache(784.0)   . Neuromuscular disorder ([place])    History reviewed. No pertinent family history. Social History   Socioeconomic History  . Marital status: Married    Spouse name: Not on file  . Number of children: Not on file  . Years of education: Not on file  . Highest education level: Not on file  Social Needs  . Financial resource strain: Not on file  . Food insecurity - worry: Not on file  . Food insecurity - inability: Not on file  . Transportation needs - medical: Not on file  . Transportation needs - non-medical: Not on file  Occupational History  . Not on file  Tobacco Use  . Smoking status: Former Smoker    Packs/day: 0.50  . Smokeless tobacco: Never Used  Substance and Sexual Activity  . Alcohol use: No  . Drug use: Yes    Types: Other-see comments  . Sexual activity: Not Currently  Other Topics Concern  . Not on file  Social History Narrative  . Not on file   No outpatient medications have been marked as taking for the 01/30/17 encounter [facility] Encounter).   Allergies  Allergen Reactions  . Penicillins     Swells throat      ROS: As per HPI, remainder of ROS negative.   OBJECTIVE:   Vitals:   01/30/17 1034  BP: 123/87  Pulse: 89  Resp: 20  Temp: 98.4 F (36.9 C)  TempSrc: Oral  SpO2: 98%  Weight: (!) 305 lb (138.3 kg)  Height: 5\' 8"  (1.727 m)     General  appearance: alert; no distress Eyes: PERRL; EOMI; conjunctiva normal HENT: normocephalic; atraumatic; TMs show retraction and dullness on righ, canal normal, external ears normal without trauma; nasal mucosa normal; oral mucosa normal Neck: supple Lungs: clear to auscultation bilaterally Heart: regular rate and rhythm Back: no CVA tenderness Extremities: no cyanosis or edema; symmetrical with no gross deformities;  Tender lateral right knee joint line with palpable crepitus. Skin: warm and dry Neurologic: normal gait; grossly normal Psychological: alert and cooperative; normal mood and affect      Labs:  Results for orders placed or performed during the hospital encounter of 09/22/14  Pregnancy, urine  Result Value Ref Range   Preg Test, Ur NEGATIVE NEGATIVE  Urinalysis, Routine w reflex microscopic  Result Value Ref Range   Color, Urine YELLOW YELLOW   APPearance CLOUDY (A) CLEAR   Specific Gravity, Urine 1.023 1.005 - 1.030   pH 5.0 5.0 - 8.0   Glucose, UA NEGATIVE NEGATIVE mg/dL   Hgb urine dipstick NEGATIVE NEGATIVE   Bilirubin Urine NEGATIVE NEGATIVE   Ketones, ur NEGATIVE NEGATIVE mg/dL   Protein, ur NEGATIVE NEGATIVE mg/dL   Urobilinogen, UA 0.2 0.0 - 1.0 mg/dL   Nitrite NEGATIVE NEGATIVE   Leukocytes, UA TRACE (  A) NEGATIVE  Urine microscopic-add on  Result Value Ref Range   Squamous Epithelial / LPF FEW (A) RARE   WBC, UA 0-2 <3 WBC/hpf   RBC / HPF 0-2 <3 RBC/hpf   Bacteria, UA FEW (A) RARE   Urine-Other MUCOUS PRESENT     Labs Reviewed - No data to display  No results found.     ASSESSMENT & PLAN:  1. Acute right otitis media   2. Acute pain of right knee     Meds ordered this encounter  Medications  . ibuprofen (ADVIL,MOTRIN) 800 MG tablet    Sig: Take 1 tablet (800 mg total) by mouth 3 (three) times daily.    Dispense:  21 tablet    Refill:  0  . azithromycin (ZITHROMAX) 250 MG tablet    Sig: Take 1 tablet (250 mg total) by mouth daily. Take  first 2 tablets together, then 1 every day until finished.    Dispense:  6 tablet    Refill:  0    Reviewed expectations re: course of current medical issues. Questions answered. Outlined signs and symptoms indicating need for more acute intervention. Patient verbalized understanding. After Visit Summary given.  Referred to Dr. Mardelle Matte and given note.    Robyn Haber, MD 01/30/17 1107    Robyn Haber, MD 01/30/17 1122

## 2017-01-30 NOTE — ED Triage Notes (Signed)
Pt here for 2 days of URI symptoms. sts that she also "popped" her right knee and is having pain there.

## 2017-02-03 DIAGNOSIS — M25561 Pain in right knee: Secondary | ICD-10-CM | POA: Diagnosis not present

## 2017-02-05 DIAGNOSIS — F319 Bipolar disorder, unspecified: Secondary | ICD-10-CM | POA: Diagnosis not present

## 2017-03-03 DIAGNOSIS — F319 Bipolar disorder, unspecified: Secondary | ICD-10-CM | POA: Diagnosis not present

## 2017-04-09 DIAGNOSIS — Z0000 Encounter for general adult medical examination without abnormal findings: Secondary | ICD-10-CM | POA: Diagnosis not present

## 2017-04-09 DIAGNOSIS — M545 Low back pain: Secondary | ICD-10-CM | POA: Diagnosis not present

## 2017-04-09 DIAGNOSIS — M129 Arthropathy, unspecified: Secondary | ICD-10-CM | POA: Diagnosis not present

## 2017-04-09 DIAGNOSIS — Z23 Encounter for immunization: Secondary | ICD-10-CM | POA: Diagnosis not present

## 2017-04-09 DIAGNOSIS — E7800 Pure hypercholesterolemia, unspecified: Secondary | ICD-10-CM | POA: Diagnosis not present

## 2017-04-09 DIAGNOSIS — E559 Vitamin D deficiency, unspecified: Secondary | ICD-10-CM | POA: Diagnosis not present

## 2017-04-09 DIAGNOSIS — R5383 Other fatigue: Secondary | ICD-10-CM | POA: Diagnosis not present

## 2017-04-09 DIAGNOSIS — Z79899 Other long term (current) drug therapy: Secondary | ICD-10-CM | POA: Diagnosis not present

## 2017-05-20 DIAGNOSIS — E7800 Pure hypercholesterolemia, unspecified: Secondary | ICD-10-CM | POA: Diagnosis not present

## 2017-05-20 DIAGNOSIS — R7303 Prediabetes: Secondary | ICD-10-CM | POA: Diagnosis not present

## 2017-05-20 DIAGNOSIS — R635 Abnormal weight gain: Secondary | ICD-10-CM | POA: Diagnosis not present

## 2017-05-20 DIAGNOSIS — R0602 Shortness of breath: Secondary | ICD-10-CM | POA: Diagnosis not present

## 2017-06-04 DIAGNOSIS — F319 Bipolar disorder, unspecified: Secondary | ICD-10-CM | POA: Diagnosis not present

## 2017-06-04 DIAGNOSIS — F4310 Post-traumatic stress disorder, unspecified: Secondary | ICD-10-CM | POA: Diagnosis not present

## 2017-08-07 DIAGNOSIS — Z90721 Acquired absence of ovaries, unilateral: Secondary | ICD-10-CM

## 2017-08-07 HISTORY — DX: Acquired absence of ovaries, unilateral: Z90.721

## 2017-08-21 ENCOUNTER — Ambulatory Visit (HOSPITAL_COMMUNITY)
Admission: AD | Admit: 2017-08-21 | Discharge: 2017-08-22 | Disposition: A | Discharge status: Home / Self Care | Payer: Medicare Other | Source: Ambulatory Visit | Attending: Obstetrics & Gynecology | Admitting: Obstetrics & Gynecology

## 2017-08-21 ENCOUNTER — Emergency Department (HOSPITAL_COMMUNITY): Payer: Medicare Other

## 2017-08-21 ENCOUNTER — Encounter (HOSPITAL_COMMUNITY): Payer: Self-pay | Admitting: Emergency Medicine

## 2017-08-21 DIAGNOSIS — N83519 Torsion of ovary and ovarian pedicle, unspecified side: Secondary | ICD-10-CM

## 2017-08-21 DIAGNOSIS — Z791 Long term (current) use of non-steroidal anti-inflammatories (NSAID): Secondary | ICD-10-CM | POA: Insufficient documentation

## 2017-08-21 DIAGNOSIS — F419 Anxiety disorder, unspecified: Secondary | ICD-10-CM | POA: Diagnosis not present

## 2017-08-21 DIAGNOSIS — D270 Benign neoplasm of right ovary: Secondary | ICD-10-CM | POA: Insufficient documentation

## 2017-08-21 DIAGNOSIS — R0789 Other chest pain: Secondary | ICD-10-CM | POA: Diagnosis not present

## 2017-08-21 DIAGNOSIS — R079 Chest pain, unspecified: Secondary | ICD-10-CM | POA: Diagnosis not present

## 2017-08-21 DIAGNOSIS — D72829 Elevated white blood cell count, unspecified: Secondary | ICD-10-CM | POA: Insufficient documentation

## 2017-08-21 DIAGNOSIS — Z87891 Personal history of nicotine dependence: Secondary | ICD-10-CM | POA: Diagnosis not present

## 2017-08-21 DIAGNOSIS — R1031 Right lower quadrant pain: Secondary | ICD-10-CM

## 2017-08-21 DIAGNOSIS — Z79899 Other long term (current) drug therapy: Secondary | ICD-10-CM | POA: Diagnosis not present

## 2017-08-21 DIAGNOSIS — K7689 Other specified diseases of liver: Secondary | ICD-10-CM | POA: Diagnosis not present

## 2017-08-21 DIAGNOSIS — F329 Major depressive disorder, single episode, unspecified: Secondary | ICD-10-CM | POA: Diagnosis not present

## 2017-08-21 DIAGNOSIS — D259 Leiomyoma of uterus, unspecified: Secondary | ICD-10-CM | POA: Diagnosis not present

## 2017-08-21 DIAGNOSIS — N83511 Torsion of right ovary and ovarian pedicle: Secondary | ICD-10-CM | POA: Diagnosis not present

## 2017-08-21 DIAGNOSIS — D369 Benign neoplasm, unspecified site: Secondary | ICD-10-CM

## 2017-08-21 DIAGNOSIS — R1110 Vomiting, unspecified: Secondary | ICD-10-CM | POA: Diagnosis not present

## 2017-08-21 LAB — CBC
HEMATOCRIT: 37.4 % (ref 36.0–46.0)
Hemoglobin: 10.9 g/dL — ABNORMAL LOW (ref 12.0–15.0)
MCH: 21.5 pg — AB (ref 26.0–34.0)
MCHC: 29.1 g/dL — AB (ref 30.0–36.0)
MCV: 73.9 fL — AB (ref 78.0–100.0)
Platelets: 353 10*3/uL (ref 150–400)
RBC: 5.06 MIL/uL (ref 3.87–5.11)
RDW: 21.8 % — ABNORMAL HIGH (ref 11.5–15.5)
WBC: 15.8 10*3/uL — ABNORMAL HIGH (ref 4.0–10.5)

## 2017-08-21 LAB — I-STAT TROPONIN, ED
TROPONIN I, POC: 0 ng/mL (ref 0.00–0.08)
Troponin i, poc: 0.01 ng/mL (ref 0.00–0.08)

## 2017-08-21 LAB — URINALYSIS, ROUTINE W REFLEX MICROSCOPIC
Bilirubin Urine: NEGATIVE
Glucose, UA: NEGATIVE mg/dL
Hgb urine dipstick: NEGATIVE
Ketones, ur: 20 mg/dL — AB
Leukocytes, UA: NEGATIVE
Nitrite: NEGATIVE
Protein, ur: NEGATIVE mg/dL
Specific Gravity, Urine: 1.021 (ref 1.005–1.030)
pH: 6 (ref 5.0–8.0)

## 2017-08-21 LAB — BASIC METABOLIC PANEL WITH GFR
Anion gap: 9 (ref 5–15)
BUN: 9 mg/dL (ref 6–20)
CO2: 25 mmol/L (ref 22–32)
Calcium: 9 mg/dL (ref 8.9–10.3)
Chloride: 105 mmol/L (ref 98–111)
Creatinine, Ser: 0.93 mg/dL (ref 0.44–1.00)
GFR calc Af Amer: >60 mL/min
GFR calc non Af Amer: >60 mL/min
Glucose, Bld: 106 mg/dL — ABNORMAL HIGH (ref 70–99)
Potassium: 3.7 mmol/L (ref 3.5–5.1)
Sodium: 139 mmol/L (ref 135–145)

## 2017-08-21 LAB — HEPATIC FUNCTION PANEL
ALBUMIN: 3.5 g/dL (ref 3.5–5.0)
ALT: 19 U/L (ref 0–44)
AST: 27 U/L (ref 15–41)
Alkaline Phosphatase: 125 U/L (ref 38–126)
BILIRUBIN TOTAL: 0.2 mg/dL — AB (ref 0.3–1.2)
Bilirubin, Direct: <0.1 mg/dL (ref 0.0–0.2)
Total Protein: 7.6 g/dL (ref 6.5–8.1)

## 2017-08-21 LAB — I-STAT BETA HCG BLOOD, ED (MC, WL, AP ONLY): I-stat hCG, quantitative: <5 m[IU]/mL

## 2017-08-21 LAB — LIPASE, BLOOD: LIPASE: 21 U/L (ref 11–51)

## 2017-08-21 MED ORDER — ONDANSETRON HCL 4 MG/2ML IJ SOLN
4.0000 mg | Freq: Once | INTRAMUSCULAR | Status: AC
  Administered 2017-08-21: 4 mg via INTRAVENOUS
  Filled 2017-08-21: qty 2

## 2017-08-21 MED ORDER — IBUPROFEN 400 MG PO TABS
400.0000 mg | ORAL_TABLET | Freq: Once | ORAL | Status: AC
  Administered 2017-08-21: 400 mg via ORAL
  Filled 2017-08-21: qty 1

## 2017-08-21 MED ORDER — IOHEXOL 300 MG/ML  SOLN
100.0000 mL | Freq: Once | INTRAMUSCULAR | Status: AC | PRN
  Administered 2017-08-21: 100 mL via INTRAVENOUS

## 2017-08-21 MED ORDER — MORPHINE SULFATE (PF) 4 MG/ML IV SOLN
4.0000 mg | Freq: Once | INTRAVENOUS | Status: AC
  Administered 2017-08-21: 4 mg via INTRAVENOUS
  Filled 2017-08-21: qty 1

## 2017-08-21 NOTE — ED Provider Notes (Signed)
[facility] EMERGENCY DEPARTMENT Provider Note   CSN: [id number] Arrival date & time: 08/21/17  1239     History   Chief Complaint Chief Complaint  Patient presents with  . Chest Pain    HPI Haley Freeman is a 42 y.o. female.  HPI 42 year old female with history of obesity, anxiety and depression presents to the emergency department today for evaluation of abdominal pain and chest pain.  States that for the past 2 days she has been having abdominal pain that she describes as "something clawing my abdomen".  States that she has been unable to tolerate p.o. for 2 days.  Denies any fevers.  States that with these painful episodes, which occur randomly, she develops chest pain in the center of her chest which she describes as pressure.  States she does have some shortness of breath when the chest pain occurs.  Has had diaphoresis.  No history of cardiac disease.  No leg swelling or history of DVT or PE.  Does not take birth control or estrogen supplementation.  Denies any diarrhea.  Has had a couple of small bowel movements since symptoms began.  Emesis is nonbloody/nonbilious.  No hematochezia or melena.  Only abdominal surgeries include cesarean sections in the past.  Does not think she is pregnant now.  No hysterectomy.   Past Medical History:  Diagnosis Date  . Alcohol abuse   . Anxiety   . Depression   . Headache(784.0)   . Neuromuscular disorder ([place])     There are no active problems to display for this patient.   Past Surgical History:  Procedure Laterality Date  . CESAREAN SECTION  '99 and '08   x 2     OB History   None      Home Medications    Prior to Admission medications   Medication Sig Start Date End Date Taking? Authorizing Provider  mirtazapine (REMERON) 45 MG tablet Take 45 mg by mouth at bedtime.   Yes [provider]  traZODone (DESYREL) 100 MG tablet Take 100 mg by mouth at bedtime.   Yes [provider]    azithromycin (ZITHROMAX) 250 MG tablet Take 1 tablet (250 mg total) by mouth daily. Take first 2 tablets together, then 1 every day until finished. Patient not taking: Reported on 08/21/2017 01/30/17   Robyn Haber, MD  ibuprofen (ADVIL,MOTRIN) 800 MG tablet Take 1 tablet (800 mg total) by mouth 3 (three) times daily. Patient not taking: Reported on 08/21/2017 01/30/17   Robyn Haber, MD    Family History No family history on file.  Social History Social History   Tobacco Use  . Smoking status: Former Smoker    Packs/day: 0.50  . Smokeless tobacco: Never Used  Substance Use Topics  . Alcohol use: No  . Drug use: Yes    Types: Other-see comments     Allergies   Penicillins   Review of Systems Review of Systems  Constitutional: Positive for diaphoresis. Negative for chills and fever.  HENT: Negative for congestion and sore throat.   Eyes: Negative for visual disturbance.  Respiratory: Positive for shortness of breath. Negative for cough.   Cardiovascular: Positive for chest pain. Negative for leg swelling.  Gastrointestinal: Positive for abdominal pain, nausea and vomiting. Negative for diarrhea.  Genitourinary: Negative for dysuria and hematuria.  Musculoskeletal: Negative for back pain and neck pain.  Skin: Negative for color change and rash.  Neurological: Negative for weakness and headaches.  All other systems  reviewed and are negative.    Physical Exam Updated Vital Signs BP 104/72   Pulse 75   Temp 98.7 F (37.1 C) (Oral)   Resp 20   LMP 08/17/2017   SpO2 98%   Physical Exam  Constitutional: She appears well-developed and well-nourished. No distress.  HENT:  Head: Normocephalic and atraumatic.  Eyes: Conjunctivae are normal.  Neck: Neck supple.  Cardiovascular: Regular rhythm and intact distal pulses. Exam reveals no gallop.  No murmur heard. Pulses:      Radial pulses are 2+ on the right side, and 2+ on the left side.  Pulmonary/Chest: Effort  normal and breath sounds normal. No respiratory distress.  Abdominal: Soft. She exhibits no distension. There is tenderness (RLQ). There is no rebound and no guarding.  Musculoskeletal: She exhibits no edema.  Neurological: She is alert.  Skin: Skin is warm and dry.  Psychiatric: She has a normal mood and affect.  Nursing note and vitals reviewed.    ED Treatments / Results  Labs (all labs ordered are listed, but only abnormal results are displayed) Labs Reviewed  BASIC METABOLIC PANEL - Abnormal; Notable for the following components:      Result Value   Glucose, Bld 106 (*)    All other components within normal limits  CBC - Abnormal; Notable for the following components:   WBC 15.8 (*)    Hemoglobin 10.9 (*)    MCV 73.9 (*)    MCH 21.5 (*)    MCHC 29.1 (*)    RDW 21.8 (*)    All other components within normal limits  URINALYSIS, ROUTINE W REFLEX MICROSCOPIC - Abnormal; Notable for the following components:   APPearance HAZY (*)    Ketones, ur 20 (*)    All other components within normal limits  HEPATIC FUNCTION PANEL - Abnormal; Notable for the following components:   Total Bilirubin 0.2 (*)    All other components within normal limits  LIPASE, BLOOD  I-STAT TROPONIN, ED  I-STAT BETA HCG BLOOD, ED ([facility], [facility], [facility] ONLY)  I-STAT TROPONIN, ED    EKG EKG Interpretation  Date/Time:  Thursday August 21 2017 12:52:19 EDT Ventricular Rate:  81 PR Interval:  160 QRS Duration: 90 QT Interval:  384 QTC Calculation: 446 R Axis:   23 Text Interpretation:  Normal sinus rhythm Normal ECG Confirmed by Lacretia Leigh ([id number]) on 08/21/2017 7:22:05 PM   Radiology Dg Chest 2 View  Result Date: 08/21/2017 CLINICAL DATA:  Chest pain for 4 days EXAM: CHEST - 2 VIEW COMPARISON:  None. FINDINGS: Lungs are clear. Heart size and pulmonary vascularity are normal. No adenopathy. There is upper thoracic dextroscoliosis. No pneumothorax. No bone lesions. IMPRESSION: No edema or consolidation.  Electronically Signed   By: Lowella Grip III M.D.   On: 08/21/2017 13:18   US Transvaginal Non-ob  Result Date: 08/22/2017 CLINICAL DATA:  Abdominal pain for 2 days.  Dermoid cyst seen on CT. EXAM: TRANSABDOMINAL AND TRANSVAGINAL ULTRASOUND OF PELVIS DOPPLER ULTRASOUND OF OVARIES TECHNIQUE: Both transabdominal and transvaginal ultrasound examinations of the pelvis were performed. Transabdominal technique was performed for global imaging of the pelvis including uterus, ovaries, adnexal regions, and pelvic cul-de-sac. It was necessary to proceed with endovaginal exam following the transabdominal exam to visualize the endometrium and ovaries. Color and duplex Doppler ultrasound was utilized to evaluate blood flow to the ovaries. COMPARISON:  CT abdomen and pelvis 08/21/2017 FINDINGS: Uterus Measurements: 8.7 x 6.1 by 5.5 cm. Heterogeneous appearance of the myometrium consistent with  uterine fibroids. A posterior myometrial fibroid is measured at 1.6 cm diameter. A posterior lower uterine segment fibroid is measured at 1.1 cm diameter. Small nabothian cysts in the cervix. Endometrium Thickness: 5.7 mm.  No focal abnormality visualized. Right ovary Measurements: 7.4 x 6.9 x 6.2 cm. Hyperechoic appearance indicating fat content consistent with dermoid cyst as better seen on CT. Left ovary Measurements: 2.5 x 1.9 x 2.6 cm. Normal appearance/no adnexal mass. Pulsed Doppler evaluation of both ovaries demonstrates normal low-resistance arterial and venous waveforms. The technologist notes that it was difficult to visualize blood flow in the ovaries, likely due to patient's body habitus and pain. No definite evidence of torsion. Other findings No abnormal free fluid. IMPRESSION: 1. Several uterine fibroids. 2. Hyperechoic appearance of the right ovary consistent with dermoid cyst as better seen at CT. 3. Visualization of flow in the ovaries is limited likely due to patient's body habitus and pain during scanning.  Arterial and venous flow waveforms are identified in both ovaries, suggesting no evidence of torsion. Electronically Signed   By: Lucienne Capers M.D.   On: 08/22/2017 00:06   US Pelvis Complete  Result Date: 08/22/2017 CLINICAL DATA:  Abdominal pain for 2 days.  Dermoid cyst seen on CT. EXAM: TRANSABDOMINAL AND TRANSVAGINAL ULTRASOUND OF PELVIS DOPPLER ULTRASOUND OF OVARIES TECHNIQUE: Both transabdominal and transvaginal ultrasound examinations of the pelvis were performed. Transabdominal technique was performed for global imaging of the pelvis including uterus, ovaries, adnexal regions, and pelvic cul-de-sac. It was necessary to proceed with endovaginal exam following the transabdominal exam to visualize the endometrium and ovaries. Color and duplex Doppler ultrasound was utilized to evaluate blood flow to the ovaries. COMPARISON:  CT abdomen and pelvis 08/21/2017 FINDINGS: Uterus Measurements: 8.7 x 6.1 by 5.5 cm. Heterogeneous appearance of the myometrium consistent with uterine fibroids. A posterior myometrial fibroid is measured at 1.6 cm diameter. A posterior lower uterine segment fibroid is measured at 1.1 cm diameter. Small nabothian cysts in the cervix. Endometrium Thickness: 5.7 mm.  No focal abnormality visualized. Right ovary Measurements: 7.4 x 6.9 x 6.2 cm. Hyperechoic appearance indicating fat content consistent with dermoid cyst as better seen on CT. Left ovary Measurements: 2.5 x 1.9 x 2.6 cm. Normal appearance/no adnexal mass. Pulsed Doppler evaluation of both ovaries demonstrates normal low-resistance arterial and venous waveforms. The technologist notes that it was difficult to visualize blood flow in the ovaries, likely due to patient's body habitus and pain. No definite evidence of torsion. Other findings No abnormal free fluid. IMPRESSION: 1. Several uterine fibroids. 2. Hyperechoic appearance of the right ovary consistent with dermoid cyst as better seen at CT. 3. Visualization of flow  in the ovaries is limited likely due to patient's body habitus and pain during scanning. Arterial and venous flow waveforms are identified in both ovaries, suggesting no evidence of torsion. Electronically Signed   By: Lucienne Capers M.D.   On: 08/22/2017 00:06   Ct Abdomen Pelvis W Contrast  Addendum Date: 08/21/2017   ADDENDUM REPORT: 08/21/2017 21:08 ADDENDUM: Surgical consultation regarding the ovarian dermoid may be helpful given the potential risk for torsion and small risk of malignant degeneration. Electronically Signed   By: Inez Catalina M.D.   On: 08/21/2017 21:08   Result Date: 08/21/2017 CLINICAL DATA:  Epigastric pain and vomiting for 2 days EXAM: CT ABDOMEN AND PELVIS WITH CONTRAST TECHNIQUE: Multidetector CT imaging of the abdomen and pelvis was performed using the standard protocol following bolus administration of intravenous contrast. CONTRAST:  121mL  OMNIPAQUE IOHEXOL 300 MG/ML  SOLN COMPARISON:  Ultrasound from earlier in the same day, CT from 01/01/2007. FINDINGS: Lower chest: Lung bases are free of acute infiltrate or sizable effusion. Hepatobiliary: The gallbladder is within normal limits. The liver is well visualized and demonstrates a vague 1.6 cm hypodensity in the posterior aspect of the right lobe near the dome which corresponds to the hyperechoic focus on recent ultrasound examination. Some peripheral enhancement is seen. These changes are most consistent with a hemangioma. Pancreas: Unremarkable. No pancreatic ductal dilatation or surrounding inflammatory changes. Spleen: Normal in size without focal abnormality. Adrenals/Urinary Tract: Adrenal glands are within normal limits. The kidneys demonstrate no renal calculi or obstructive changes. The bladder is partially distended. Stomach/Bowel: The appendix is within normal limits. No obstructive or inflammatory changes of the bowel are seen. Stomach is within normal limits. Vascular/Lymphatic: No significant vascular findings are  present. No enlarged abdominal or pelvic lymph nodes. Reproductive: Uterus is within normal limits. The left ovary is unremarkable. The right ovary now demonstrates a 7 cm predominately fatty lesion with central soft tissue density and calcification consistent with an ovarian dermoid. Other: No abdominal wall hernia or abnormality. No abdominopelvic ascites. Musculoskeletal: No acute or significant osseous findings. IMPRESSION: No focal abnormality to correspond with the patient's given clinical history is noted. Hypodensity within the liver consistent with a small hemangioma. Follow-up ultrasound in 6 months is recommended for further evaluation given the patient's age in lack of neoplastic history. Predominately fatty lesion with central calcification and soft tissue density on the right consistent with an ovarian dermoid. This could be confirmed with nonemergent ultrasound. No other focal abnormality is noted. Electronically Signed: By: Inez Catalina M.D. On: 08/21/2017 21:04   US Abdomen Limited  Result Date: 08/21/2017 CLINICAL DATA:  Right upper quadrant pain for 2 days EXAM: ULTRASOUND ABDOMEN LIMITED RIGHT UPPER QUADRANT COMPARISON:  CT 01/01/2007 FINDINGS: Gallbladder: No gallstones or wall thickening visualized. No sonographic Murphy sign noted by sonographer. Common bile duct: Diameter: 3 mm Liver: Echogenicity within normal limits. Hyperechoic mass in the posterior right hepatic lobe measuring 1.9 x 1.8 x 1.6 cm. Portal vein is patent on color Doppler imaging with normal direction of blood flow towards the liver. IMPRESSION: 1. Negative for gallstones or biliary dilatation. 2. 1.9 cm hypoechoic mass in the posterior right hepatic lobe, possible hemangioma. Could be evaluated with MRI on a nonemergent basis. Electronically Signed   By: Donavan Foil M.D.   On: 08/21/2017 20:33   [name] Art/ven Flow Abd Pelv Doppler  Result Date: 08/22/2017 CLINICAL DATA:  Abdominal pain for 2 days.  Dermoid cyst seen  on CT. EXAM: TRANSABDOMINAL AND TRANSVAGINAL ULTRASOUND OF PELVIS DOPPLER ULTRASOUND OF OVARIES TECHNIQUE: Both transabdominal and transvaginal ultrasound examinations of the pelvis were performed. Transabdominal technique was performed for global imaging of the pelvis including uterus, ovaries, adnexal regions, and pelvic cul-de-sac. It was necessary to proceed with endovaginal exam following the transabdominal exam to visualize the endometrium and ovaries. Color and duplex Doppler ultrasound was utilized to evaluate blood flow to the ovaries. COMPARISON:  CT abdomen and pelvis 08/21/2017 FINDINGS: Uterus Measurements: 8.7 x 6.1 by 5.5 cm. Heterogeneous appearance of the myometrium consistent with uterine fibroids. A posterior myometrial fibroid is measured at 1.6 cm diameter. A posterior lower uterine segment fibroid is measured at 1.1 cm diameter. Small nabothian cysts in the cervix. Endometrium Thickness: 5.7 mm.  No focal abnormality visualized. Right ovary Measurements: 7.4 x 6.9 x 6.2 cm. Hyperechoic appearance indicating  fat content consistent with dermoid cyst as better seen on CT. Left ovary Measurements: 2.5 x 1.9 x 2.6 cm. Normal appearance/no adnexal mass. Pulsed Doppler evaluation of both ovaries demonstrates normal low-resistance arterial and venous waveforms. The technologist notes that it was difficult to visualize blood flow in the ovaries, likely due to patient's body habitus and pain. No definite evidence of torsion. Other findings No abnormal free fluid. IMPRESSION: 1. Several uterine fibroids. 2. Hyperechoic appearance of the right ovary consistent with dermoid cyst as better seen at CT. 3. Visualization of flow in the ovaries is limited likely due to patient's body habitus and pain during scanning. Arterial and venous flow waveforms are identified in both ovaries, suggesting no evidence of torsion. Electronically Signed   By: Lucienne Capers M.D.   On: 08/22/2017 00:06     Procedures Procedures (including critical care time)  Medications Ordered in ED Medications  metoCLOPramide (REGLAN) injection 10 mg (has no administration in time range)  diphenhydrAMINE (BENADRYL) injection 25 mg (has no administration in time range)  morphine 4 MG/ML injection 6 mg (has no administration in time range)  ibuprofen (ADVIL,MOTRIN) tablet 400 mg (400 mg Oral Given 08/21/17 1925)  ondansetron (ZOFRAN) injection 4 mg (4 mg Intravenous Given 08/21/17 1925)  iohexol (OMNIPAQUE) 300 MG/ML solution 100 mL (100 mLs Intravenous Contrast Given 08/21/17 2031)  ondansetron (ZOFRAN) injection 4 mg (4 mg Intravenous Given 08/21/17 2249)  morphine 4 MG/ML injection 4 mg (4 mg Intravenous Given 08/21/17 2249)     Initial Impression / Assessment and Plan / ED Course  I have reviewed the triage vital signs and the nursing notes.  Pertinent labs & imaging results that were available during my care of the patient were reviewed by me and considered in my medical decision making (see chart for details).    42 year old female presents for abdominal pain and chest pain for 2 days.  Patient is afebrile, hemodynamically stable, well-appearing.  No distress.  Nontoxic-appearing.  Exam as detailed above.  Pain appears to be more prominently in the abdomen.  Chest pain only started yesterday and only occurs with abdominal pain episodes.  She has no history of CAD.  No cardiac problems that she is aware of.  She is obese but carries no other risk factors.  She is low risk by Wells criteria and PERC negative for PE. ECG reviewed with rate approximately 80, NSR, normal axis, intervals within normal limits, no acute ST or T wave changes to suggest acute ischemia.  Otherwise unremarkable ECG with no prior ECGs available for comparison. HEART pathway score of 3.  Low risk for ACS.  Will obtain delta troponin. CXR with no acute cardiac or pulmonary normality.  No pneumothorax.  No widening of mediastinum.   History and exam not consistent with dissection.  No pneumonia.  No effusions. Trop x2 undetectable.   I am more concerned for her abdominal pain.  She has focal tenderness in the right lower quadrant.  Has had some anorexia and given her nausea and vomiting concern for appendicitis.  Will obtain CT abdomen/pelvis for further evaluation.  Less concerning for bowel obstruction.  Will obtain lipase for evaluation of pancreatitis as well. Pregnancy test negative. Motrin and Zofran given in ED. On reevaluation pt also with + murphy sign. [name] RUQ ordered.  Labs reviewed including CBC, CMP, Lipase, UA, and remarkable only for WBC of 16 of unclear etiology. Stable anemia.   CT revealed 7cm right ovarian dermoid cyst in area of tenderness.  RUQ [name] with no acute findings or cholecystis. Given persitsent pain and CT findings, pelvic US obtained that shows no active torsion but does reveal large dermoid cyst. Pt continues to have recurrent episodes of severe RLQ pain with diaphoresis and retching. These occur randomly and last several minutes. I am concerned for intermittent torsion. OBGYN consulted. Spoke with Dr. Ihor Dow who recommends transfer to [facility]. Pt is agreeable with this plan. She has received multiple doses IV morphine. Zofran and IV reglan given. Transportation being arranged for transport. Handed off to Dr. Zenia Resides at 0100 awaiting transport.   Case and plan of care discussed with Dr. Zenia Resides.  Final Clinical Impressions(s) / ED Diagnoses   Final diagnoses:  Atypical chest pain  RLQ abdominal pain  Dermoid cyst of right ovary    ED Discharge Orders    None       Corrie Dandy, MD 08/22/17 [id number]    Lacretia Leigh, MD 08/24/17 [phone]

## 2017-08-21 NOTE — ED Provider Notes (Signed)
I saw and evaluated the patient, reviewed the resident's note and I agree with the findings and plan.  EKG: EKG Interpretation  Date/Time:  Thursday August 21 2017 12:52:19 EDT Ventricular Rate:  81 PR Interval:  160 QRS Duration: 90 QT Interval:  384 QTC Calculation: 446 R Axis:   23 Text Interpretation:  Normal sinus rhythm Normal ECG Confirmed by Lacretia Leigh ([id number]) on 08/21/2017 7:22:05 PM   Complain of several days of epigastric abdominal pain radiating to her right upper quadrant.  Symptoms started after she ate fried fish.  She is tender in epigastric area without peritoneal signs.  Does have leukocytosis noted.  Will order right upper quadrant ultrasound to rule out gallbladder pathology.  Hepatic function panel and lipase are pending.   Lacretia Leigh, MD 08/21/17 [phone]

## 2017-08-21 NOTE — Discharge Instructions (Addendum)
The following finding was found incidentally on your ultrasound. Recommend showing this to your doctor and following up as needed. "1.9 cm hypoechoic mass in the posterior right hepatic lobe, possible hemangioma. Could be evaluated with MRI on a nonemergent basis." "Follow-up ultrasound in 6 months is recommended for further evaluation given the patient's age in lack of neoplastic history."    Diagnostic Laparoscopy, Care After Refer to this sheet in the next few weeks. These instructions provide you with information about caring for yourself after your procedure. Your health care provider may also give you more specific instructions. Your treatment has been planned according to current medical practices, but problems sometimes occur. Call your health care provider if you have any problems or questions after your procedure. What can I expect after the procedure? After your procedure, it is common to have mild discomfort in the throat and abdomen. Follow these instructions at home:  Take over-the-counter and prescription medicines only as told by your health care provider.  Do not drive for 24 hours if you received a sedative.  Return to your normal activities as told by your health care provider.  Do not take baths, swim, or use a hot tub until your health care provider approves. You may shower.  Follow instructions from your health care provider about how to take care of your incision. Make sure you: ? Wash your hands with soap and water before you change your bandage (dressing). If soap and water are not available, use hand sanitizer. ? Change your dressing as told by your health care provider. ? Leave stitches (sutures), skin glue, or adhesive strips in place. These skin closures may need to stay in place for 2 weeks or longer. If adhesive strip edges start to loosen and curl up, you may trim the loose edges. Do not remove adhesive strips completely unless your health care provider tells you  to do that.  Check your incision area every day for signs of infection. Check for: ? More redness, swelling, or pain. ? More fluid or blood. ? Warmth. ? Pus or a bad smell.  It is your responsibility to get the results of your procedure. Ask your health care provider or the department performing the procedure when your results will be ready. Contact a health care provider if:  There is new pain in your shoulders.  You feel light-headed or faint.  You are unable to pass gas or unable to have a bowel movement.  You feel nauseous or you vomit.  You develop a rash.  You have more redness, swelling, or pain around your incision.  You have more fluid or blood coming from your incision.  Your incision feels warm to the touch.  You have pus or a bad smell coming from your incision.  You have a fever or chills. Get help right away if:  Your pain is getting worse.  You have ongoing vomiting.  The edges of your incision open up.  You have trouble breathing.  You have chest pain. This information is not intended to replace advice given to you by your health care provider. Make sure you discuss any questions you have with your health care provider. Document Released: 12/05/2014 Document Revised: 06/01/2015 Document Reviewed: 09/06/2014 Elsevier Interactive Patient Education  2018 [place] [facility] Instructions  Activity: Get plenty of rest for the remainder of the day. A responsible individual must stay with you for 24 hours following the procedure.  For the next 24  hours, DO NOT: -Drive a car -Paediatric nurse -Drink alcoholic beverages -Take any medication unless instructed by your physician -Make any legal decisions or sign important papers.  Meals: Start with liquid foods such as gelatin or soup. Progress to regular foods as tolerated. Avoid greasy, spicy, heavy foods. If nausea and/or vomiting occur, drink only clear liquids until the nausea  and/or vomiting subsides. Call your physician if vomiting continues.  Special Instructions/Symptoms: Your throat may feel dry or sore from the anesthesia or the breathing tube placed in your throat during surgery. If this causes discomfort, gargle with warm salt water. The discomfort should disappear within 24 hours. Marland Kitchen

## 2017-08-21 NOTE — ED Triage Notes (Signed)
Patient complains of chest pressure x2 days. Patient thought initially it was indigestion and took alka seltzer without relief. Pressure got worse over night so came in for evaluation. Patient denies cardiac history, reports multiple family members with MI's and similar symptoms. Patient alert, oriented, and in no apparent distress at this time.

## 2017-08-21 NOTE — ED Notes (Signed)
Patient transported to [facility] 

## 2017-08-22 ENCOUNTER — Inpatient Hospital Stay (HOSPITAL_COMMUNITY): Payer: Medicare Other | Admitting: Anesthesiology

## 2017-08-22 ENCOUNTER — Encounter (HOSPITAL_COMMUNITY): Payer: Self-pay

## 2017-08-22 ENCOUNTER — Encounter (HOSPITAL_COMMUNITY)
Admission: AD | Disposition: A | Discharge status: Home / Self Care | Payer: Self-pay | Source: Ambulatory Visit | Attending: Emergency Medicine

## 2017-08-22 DIAGNOSIS — D72829 Elevated white blood cell count, unspecified: Secondary | ICD-10-CM | POA: Diagnosis not present

## 2017-08-22 DIAGNOSIS — Z87891 Personal history of nicotine dependence: Secondary | ICD-10-CM | POA: Diagnosis not present

## 2017-08-22 DIAGNOSIS — D369 Benign neoplasm, unspecified site: Secondary | ICD-10-CM

## 2017-08-22 DIAGNOSIS — D270 Benign neoplasm of right ovary: Secondary | ICD-10-CM | POA: Diagnosis not present

## 2017-08-22 DIAGNOSIS — F329 Major depressive disorder, single episode, unspecified: Secondary | ICD-10-CM | POA: Diagnosis not present

## 2017-08-22 DIAGNOSIS — Z79899 Other long term (current) drug therapy: Secondary | ICD-10-CM | POA: Diagnosis not present

## 2017-08-22 DIAGNOSIS — Z791 Long term (current) use of non-steroidal anti-inflammatories (NSAID): Secondary | ICD-10-CM | POA: Diagnosis not present

## 2017-08-22 DIAGNOSIS — F419 Anxiety disorder, unspecified: Secondary | ICD-10-CM | POA: Diagnosis not present

## 2017-08-22 HISTORY — PX: LAPAROSCOPY: SHX197

## 2017-08-22 LAB — CBC
HCT: 31.8 % — ABNORMAL LOW (ref 36.0–46.0)
Hemoglobin: 10 g/dL — ABNORMAL LOW (ref 12.0–15.0)
MCH: 22.5 pg — AB (ref 26.0–34.0)
MCHC: 31.4 g/dL (ref 30.0–36.0)
MCV: 71.6 fL — AB (ref 78.0–100.0)
PLATELETS: 318 10*3/uL (ref 150–400)
RBC: 4.44 MIL/uL (ref 3.87–5.11)
RDW: 21.1 % — AB (ref 11.5–15.5)
WBC: 18.6 10*3/uL — ABNORMAL HIGH (ref 4.0–10.5)

## 2017-08-22 LAB — TYPE AND SCREEN
ABO/RH(D): O NEG
Antibody Screen: NEGATIVE

## 2017-08-22 LAB — PREGNANCY, URINE: Preg Test, Ur: NEGATIVE

## 2017-08-22 LAB — POCT PREGNANCY, URINE: Preg Test, Ur: NEGATIVE

## 2017-08-22 SURGERY — LAPAROSCOPY OPERATIVE
Anesthesia: General | Site: Abdomen | Laterality: Right

## 2017-08-22 MED ORDER — ONDANSETRON HCL 4 MG/2ML IJ SOLN
4.0000 mg | Freq: Once | INTRAMUSCULAR | Status: AC
  Administered 2017-08-22: 4 mg via INTRAVENOUS
  Filled 2017-08-22: qty 2

## 2017-08-22 MED ORDER — SUGAMMADEX SODIUM 500 MG/5ML IV SOLN
INTRAVENOUS | Status: AC
  Filled 2017-08-22: qty 5

## 2017-08-22 MED ORDER — HYDROCODONE-ACETAMINOPHEN 7.5-325 MG PO TABS
ORAL_TABLET | ORAL | Status: AC
  Filled 2017-08-22: qty 1

## 2017-08-22 MED ORDER — HYDROMORPHONE HCL 1 MG/ML IJ SOLN: 0.2500 mg | INTRAMUSCULAR | Status: DC | PRN

## 2017-08-22 MED ORDER — PROPOFOL 10 MG/ML IV BOLUS
INTRAVENOUS | Status: DC | PRN
  Administered 2017-08-22: 250 mg via INTRAVENOUS

## 2017-08-22 MED ORDER — ROCURONIUM BROMIDE 100 MG/10ML IV SOLN
INTRAVENOUS | Status: DC | PRN
  Administered 2017-08-22: 20 mg via INTRAVENOUS
  Administered 2017-08-22: 50 mg via INTRAVENOUS

## 2017-08-22 MED ORDER — MORPHINE SULFATE (PF) 4 MG/ML IV SOLN
6.0000 mg | Freq: Once | INTRAVENOUS | Status: AC
  Administered 2017-08-22: 6 mg via INTRAVENOUS
  Filled 2017-08-22: qty 2

## 2017-08-22 MED ORDER — HYDROMORPHONE HCL 1 MG/ML IJ SOLN
2.0000 mg | INTRAMUSCULAR | Status: DC | PRN
  Administered 2017-08-22 (×2): 2 mg via INTRAVENOUS
  Filled 2017-08-22 (×2): qty 2

## 2017-08-22 MED ORDER — SODIUM CHLORIDE 0.9 % IR SOLN
Status: DC | PRN
  Administered 2017-08-22: 3000 mL

## 2017-08-22 MED ORDER — HYDROCODONE-ACETAMINOPHEN 7.5-325 MG PO TABS
1.0000 | ORAL_TABLET | Freq: Once | ORAL | Status: AC | PRN
  Administered 2017-08-22: 1 via ORAL

## 2017-08-22 MED ORDER — FENTANYL CITRATE (PF) 100 MCG/2ML IJ SOLN
INTRAMUSCULAR | Status: DC | PRN
  Administered 2017-08-22: 100 ug via INTRAVENOUS
  Administered 2017-08-22: 50 ug via INTRAVENOUS
  Administered 2017-08-22: 100 ug via INTRAVENOUS

## 2017-08-22 MED ORDER — ONDANSETRON HCL 4 MG/2ML IJ SOLN
INTRAMUSCULAR | Status: AC
  Filled 2017-08-22: qty 2

## 2017-08-22 MED ORDER — FENTANYL CITRATE (PF) 250 MCG/5ML IJ SOLN
INTRAMUSCULAR | Status: AC
  Filled 2017-08-22: qty 5

## 2017-08-22 MED ORDER — PROMETHAZINE HCL 25 MG/ML IJ SOLN
25.0000 mg | INTRAMUSCULAR | Status: DC | PRN
  Administered 2017-08-22 (×2): 25 mg via INTRAVENOUS
  Filled 2017-08-22 (×2): qty 1

## 2017-08-22 MED ORDER — SIMETHICONE 80 MG PO CHEW
80.0000 mg | CHEWABLE_TABLET | Freq: Once | ORAL | Status: AC
  Administered 2017-08-22: 80 mg via ORAL
  Filled 2017-08-22: qty 1

## 2017-08-22 MED ORDER — ONDANSETRON HCL 4 MG/2ML IJ SOLN
INTRAMUSCULAR | Status: DC | PRN
  Administered 2017-08-22: 4 mg via INTRAVENOUS

## 2017-08-22 MED ORDER — ROCURONIUM BROMIDE 100 MG/10ML IV SOLN
INTRAVENOUS | Status: AC
  Filled 2017-08-22: qty 1

## 2017-08-22 MED ORDER — METOCLOPRAMIDE HCL 5 MG/ML IJ SOLN: 10.0000 mg | Freq: Once | INTRAMUSCULAR | Status: DC | PRN

## 2017-08-22 MED ORDER — MIDAZOLAM HCL 2 MG/2ML IJ SOLN
INTRAMUSCULAR | Status: DC | PRN
  Administered 2017-08-22: 2 mg via INTRAVENOUS

## 2017-08-22 MED ORDER — DEXAMETHASONE SODIUM PHOSPHATE 10 MG/ML IJ SOLN
INTRAMUSCULAR | Status: DC | PRN
  Administered 2017-08-22: 4 mg via INTRAVENOUS

## 2017-08-22 MED ORDER — KETOROLAC TROMETHAMINE 60 MG/2ML IM SOLN
60.0000 mg | Freq: Once | INTRAMUSCULAR | Status: AC
  Administered 2017-08-22: 60 mg via INTRAMUSCULAR
  Filled 2017-08-22: qty 2

## 2017-08-22 MED ORDER — DIPHENHYDRAMINE HCL 50 MG/ML IJ SOLN
25.0000 mg | Freq: Once | INTRAMUSCULAR | Status: AC
  Administered 2017-08-22: 25 mg via INTRAVENOUS
  Filled 2017-08-22: qty 1

## 2017-08-22 MED ORDER — BUPIVACAINE HCL 0.25 % IJ SOLN
INTRAMUSCULAR | Status: DC | PRN
  Administered 2017-08-22: 15 mL

## 2017-08-22 MED ORDER — KETOROLAC TROMETHAMINE 30 MG/ML IJ SOLN
INTRAMUSCULAR | Status: AC
  Filled 2017-08-22: qty 1

## 2017-08-22 MED ORDER — OXYCODONE-ACETAMINOPHEN 5-325 MG PO TABS: 1.0000 | ORAL_TABLET | Freq: Four times a day (QID) | ORAL | 0 refills | Status: DC | PRN

## 2017-08-22 MED ORDER — LACTATED RINGERS IV SOLN: INTRAVENOUS | Status: DC

## 2017-08-22 MED ORDER — MIDAZOLAM HCL 2 MG/2ML IJ SOLN
INTRAMUSCULAR | Status: AC
  Filled 2017-08-22: qty 2

## 2017-08-22 MED ORDER — GLYCOPYRROLATE 0.2 MG/ML IJ SOLN
INTRAMUSCULAR | Status: AC
  Filled 2017-08-22: qty 3

## 2017-08-22 MED ORDER — LIDOCAINE HCL (CARDIAC) PF 100 MG/5ML IV SOSY
PREFILLED_SYRINGE | INTRAVENOUS | Status: AC
  Filled 2017-08-22: qty 5

## 2017-08-22 MED ORDER — LACTATED RINGERS IV SOLN
INTRAVENOUS | Status: DC
  Administered 2017-08-22 (×3): via INTRAVENOUS

## 2017-08-22 MED ORDER — MEPERIDINE HCL 25 MG/ML IJ SOLN: 6.2500 mg | INTRAMUSCULAR | Status: DC | PRN

## 2017-08-22 MED ORDER — LIDOCAINE HCL (CARDIAC) PF 100 MG/5ML IV SOSY
PREFILLED_SYRINGE | INTRAVENOUS | Status: DC | PRN
  Administered 2017-08-22: 100 mg via INTRAVENOUS

## 2017-08-22 MED ORDER — SUGAMMADEX SODIUM 200 MG/2ML IV SOLN
INTRAVENOUS | Status: DC | PRN
  Administered 2017-08-22: 356 mg via INTRAVENOUS

## 2017-08-22 MED ORDER — METOCLOPRAMIDE HCL 5 MG/ML IJ SOLN
10.0000 mg | Freq: Once | INTRAMUSCULAR | Status: AC
  Administered 2017-08-22: 10 mg via INTRAVENOUS
  Filled 2017-08-22: qty 2

## 2017-08-22 MED ORDER — PROPOFOL 10 MG/ML IV BOLUS
INTRAVENOUS | Status: AC
  Filled 2017-08-22: qty 20

## 2017-08-22 MED ORDER — DEXAMETHASONE SODIUM PHOSPHATE 4 MG/ML IJ SOLN
INTRAMUSCULAR | Status: AC
  Filled 2017-08-22: qty 1

## 2017-08-22 MED ORDER — BUPIVACAINE HCL (PF) 0.25 % IJ SOLN
INTRAMUSCULAR | Status: AC
  Filled 2017-08-22: qty 30

## 2017-08-22 SURGICAL SUPPLY — 39 items
APPLICATOR ARISTA FLEXITIP XL (MISCELLANEOUS) ×3 IMPLANT
DERMABOND ADVANCED (GAUZE/BANDAGES/DRESSINGS) ×2
DERMABOND ADVANCED .7 DNX12 (GAUZE/BANDAGES/DRESSINGS) ×1 IMPLANT
DRSG COVADERM PLUS 2X2 (GAUZE/BANDAGES/DRESSINGS) ×6 IMPLANT
DRSG OPSITE POSTOP 3X4 (GAUZE/BANDAGES/DRESSINGS) ×3 IMPLANT
DURAPREP 26ML APPLICATOR (WOUND CARE) ×3 IMPLANT
GLOVE BIOGEL PI IND STRL 6.5 (GLOVE) ×2 IMPLANT
GLOVE BIOGEL PI IND STRL 7.0 (GLOVE) ×2 IMPLANT
GLOVE BIOGEL PI INDICATOR 6.5 (GLOVE) ×4
GLOVE BIOGEL PI INDICATOR 7.0 (GLOVE) ×4
GLOVE ORTHOPEDIC STR SZ6.5 (GLOVE) ×3 IMPLANT
GLOVE SURG SS PI 7.0 STRL IVOR (GLOVE) ×9 IMPLANT
GOWN STRL REUS W/TWL LRG LVL3 (GOWN DISPOSABLE) ×9 IMPLANT
HEMOSTAT ARISTA ABSORB 3G PWDR (MISCELLANEOUS) ×3 IMPLANT
LIGASURE 5MM LAPAROSCOPIC (INSTRUMENTS) ×3 IMPLANT
LIGASURE VESSEL 5MM BLUNT TIP (ELECTROSURGICAL) IMPLANT
NDL INSUFF ACCESS 14 VERSASTEP (NEEDLE) IMPLANT
NS IRRIG 1000ML POUR BTL (IV SOLUTION) ×3 IMPLANT
PACK LAPAROSCOPY BASIN (CUSTOM PROCEDURE TRAY) ×3 IMPLANT
PACK TRENDGUARD 450 HYBRID PRO (MISCELLANEOUS) IMPLANT
PACK TRENDGUARD 600 HYBRD PROC (MISCELLANEOUS) ×1 IMPLANT
POUCH ENDO CATCH II 15MM (MISCELLANEOUS) ×3 IMPLANT
POUCH SPECIMEN RETRIEVAL 10MM (ENDOMECHANICALS) IMPLANT
PROTECTOR NERVE ULNAR (MISCELLANEOUS) ×6 IMPLANT
SET IRRIG TUBING LAPAROSCOPIC (IRRIGATION / IRRIGATOR) ×3 IMPLANT
SUT MNCRL AB 4-0 PS2 18 (SUTURE) ×3 IMPLANT
SUT VICRYL 0 UR6 27IN ABS (SUTURE) ×3 IMPLANT
SYSTEM CARTER THOMASON II (TROCAR) ×3 IMPLANT
TOWEL OR 17X24 6PK STRL BLUE (TOWEL DISPOSABLE) ×6 IMPLANT
TRAY FOLEY W/BAG SLVR 14FR (SET/KITS/TRAYS/PACK) ×3 IMPLANT
TRENDGUARD 450 HYBRID PRO PACK (MISCELLANEOUS)
TRENDGUARD 600 HYBRID PROC PK (MISCELLANEOUS) ×3
TROCAR 5M 150ML BLDLS (TROCAR) ×6 IMPLANT
TROCAR BLADELESS 15MM (ENDOMECHANICALS) ×3 IMPLANT
TROCAR VERSASTEP PLUS 12MM (TROCAR) IMPLANT
TROCAR VERSASTEP PLUS 5MM (TROCAR) IMPLANT
TROCAR XCEL DIL TIP R 11M (ENDOMECHANICALS) ×3 IMPLANT
TUBING INSUF HEATED (TUBING) ×3 IMPLANT
WARMER LAPAROSCOPE (MISCELLANEOUS) ×3 IMPLANT

## 2017-08-22 NOTE — Anesthesia Postprocedure Evaluation (Signed)
Anesthesia Post Note  Patient: Haley Freeman  Procedure(s) Performed: LAPAROSCOPY OPERATIVE removal of right ovary and right dermoid cyst (Right Abdomen)     Patient location during evaluation: PACU Anesthesia Type: General Level of consciousness: awake and alert Pain management: pain level controlled Vital Signs Assessment: post-procedure vital signs reviewed and stable Respiratory status: spontaneous breathing, nonlabored ventilation, respiratory function stable and patient connected to nasal cannula oxygen Cardiovascular status: blood pressure returned to baseline and stable Postop Assessment: no apparent nausea or vomiting Anesthetic complications: no    Last Vitals:  Vitals:   08/22/17 1430 08/22/17 1442  BP: 140/81 (!) 145/83  Pulse: 84 88  Resp: 20 (!) 21  Temp:    SpO2: 98% 95%    Last Pain:  Vitals:   08/22/17 1442  TempSrc:   PainSc: 2    Pain Goal:                 Chelsey L Woodrum

## 2017-08-22 NOTE — MAU Note (Signed)
Arrived from [facility] for further evaluation of abdominal pain related to cysts/fibroids.  Patient reports some spotting since her exam at [facility].  Pain started 2 days ago- comes on strong and has vomited 10-15X in the last 24 hours.

## 2017-08-22 NOTE — Anesthesia Preprocedure Evaluation (Signed)
Anesthesia Evaluation  Patient identified by MRN, date of birth, ID band Patient awake    Reviewed: Allergy & Precautions, Patient's Chart, lab work & pertinent test results  Airway Mallampati: III  TM Distance: >3 FB Neck ROM: Full    Dental no notable dental hx. (+) Teeth Intact   Pulmonary former smoker,    Pulmonary exam normal breath sounds clear to auscultation       Cardiovascular negative cardio ROS Normal cardiovascular exam Rhythm:Regular Rate:Normal     Neuro/Psych  Headaches, PSYCHIATRIC DISORDERS Anxiety Depression  Neuromuscular disease    GI/Hepatic negative GI ROS, Neg liver ROS,   Endo/Other  Morbid obesity  Renal/GU negative Renal ROS  negative genitourinary   Musculoskeletal   Abdominal (+) + obese,   Peds  Hematology   Anesthesia Other Findings   Reproductive/Obstetrics Right ovarian torsion                             Anesthesia Physical Anesthesia Plan  ASA: III and emergent  Anesthesia Plan: General   Post-op Pain Management:    Induction: Intravenous, Rapid sequence and Cricoid pressure planned  PONV Risk Score and Plan: 4 or greater and Ondansetron, Dexamethasone, Midazolam, Scopolamine patch - Pre-op and Treatment may vary due to age or medical condition  Airway Management Planned: Oral ETT  Additional Equipment:   Intra-op Plan:   Post-operative Plan: Extubation in OR  Informed Consent: I have reviewed the patients History and Physical, chart, labs and discussed the procedure including the risks, benefits and alternatives for the proposed anesthesia with the patient or authorized representative who has indicated his/her understanding and acceptance.   Dental advisory given  Plan Discussed with: CRNA and Surgeon  Anesthesia Plan Comments:         Anesthesia Quick Evaluation

## 2017-08-22 NOTE — Anesthesia Procedure Notes (Signed)
Procedure Name: Intubation Date/Time: 08/22/2017 12:10 PM Performed by: Hewitt Blade, CRNA Pre-anesthesia Checklist: Patient identified, Emergency Drugs available, Suction available and Patient being monitored Patient Re-evaluated:Patient Re-evaluated prior to induction Oxygen Delivery Method: Circle system utilized Preoxygenation: Pre-oxygenation with 100% oxygen Induction Type: IV induction Ventilation: Mask ventilation without difficulty and Oral airway inserted - appropriate to patient size Laryngoscope Size: Mac and 3 Grade View: Grade I Tube type: Oral Number of attempts: 1 Airway Equipment and Method: Stylet Placement Confirmation: ETT inserted through vocal cords under direct vision,  positive ETCO2 and breath sounds checked- equal and bilateral Secured at: 21 cm Tube secured with: Tape Dental Injury: Teeth and Oropharynx as per pre-operative assessment

## 2017-08-22 NOTE — H&P (Signed)
OB/GYN History and Physical  Haley Freeman is a 42 y.o. G3P3 presenting for several days of intermittent pain in right lower quadrant and right mid abdomen. She reports severe pain, with associated nausea. Sometimes pain improves but then gets bad again. Improves with IV dilaudid but is now constantly there. Noted to have 7 cm dermoid on CT.       Past Medical History:  Diagnosis Date  . Alcohol abuse   . Anxiety   . Depression   . Headache(784.0)   . Neuromuscular disorder [facility])     Past Surgical History:  Procedure Laterality Date  . CESAREAN SECTION  '99 and '08   x 2    OB History  Gravida Para Term Preterm AB Living  3 3          SAB TAB Ectopic Multiple Live Births               # Outcome Date GA Lbr Len/2nd Weight Sex Delivery Anes PTL Lv  3 Para      CS-Unspec     2 Para      Vag-Spont     1 Para      CS-Unspec       Social History   Socioeconomic History  . Marital status: Married    Spouse name: Not on file  . Number of children: Not on file  . Years of education: Not on file  . Highest education level: Not on file  Occupational History  . Not on file  Social Needs  . Financial resource strain: Not on file  . Food insecurity:    Worry: Not on file    Inability: Not on file  . Transportation needs:    Medical: Not on file    Non-medical: Not on file  Tobacco Use  . Smoking status: Former Smoker    Packs/day: 0.50  . Smokeless tobacco: Never Used  Substance and Sexual Activity  . Alcohol use: No  . Drug use: Yes    Types: Other-see comments, Marijuana    Comment: Occasionally smokes marijuana  . Sexual activity: Not Currently  Lifestyle  . Physical activity:    Days per week: Not on file    Minutes per session: Not on file  . Stress: Not on file  Relationships  . Social connections:    Talks on phone: Not on file    Gets together: Not on file    Attends religious service: Not on file    Active member of club or organization:  Not on file    Attends meetings of clubs or organizations: Not on file    Relationship status: Not on file  Other Topics Concern  . Not on file  Social History Narrative  . Not on file    No family history on file.  Medications Prior to Admission  Medication Sig Dispense Refill Last Dose  . aspirin-sod bicarb-citric acid (ALKA-SELTZER) 325 MG TBEF tablet Take 325 mg by mouth every 6 (six) hours as needed.   08/21/2017 at Unknown time  . ibuprofen (ADVIL,MOTRIN) 600 MG tablet Take 600 mg by mouth every 6 (six) hours as needed.   08/21/2017 at Unknown time  . mirtazapine (REMERON) 45 MG tablet Take 45 mg by mouth at bedtime.   08/20/2017 at Unknown time  . traZODone (DESYREL) 100 MG tablet Take 100 mg by mouth at bedtime.   08/20/2017 at Unknown time  . azithromycin (ZITHROMAX) 250 MG tablet Take 1 tablet (250  mg total) by mouth daily. Take first 2 tablets together, then 1 every day until finished. (Patient not taking: Reported on 08/21/2017) 6 tablet 0 Not Taking at Unknown time  . ibuprofen (ADVIL,MOTRIN) 800 MG tablet Take 1 tablet (800 mg total) by mouth 3 (three) times daily. (Patient not taking: Reported on 08/21/2017) 21 tablet 0 Not Taking at Unknown time    Allergies  Allergen Reactions  . Penicillins     Swells throat    Review of Systems: Negative except for what is mentioned in HPI.     Physical Exam: BP 119/74   Pulse 77   Temp 97.7 F (36.5 C)   Resp 18   LMP 08/10/2017   SpO2 100%  CONSTITUTIONAL: Well-developed, well-nourished female in mild distress.  HENT:  Normocephalic, atraumatic, External right and left ear normal. Oropharynx is clear and moist EYES: Conjunctivae and EOM are normal. Pupils are equal, round, and reactive to light. No scleral icterus.  NECK: Normal range of motion, supple, no masses SKIN: Skin is warm and dry. No rash noted. Not diaphoretic. No erythema. No pallor. [place]: Alert and oriented to person, place, and time. Normal reflexes,  muscle tone coordination. No cranial nerve deficit noted. PSYCHIATRIC: Normal mood and affect. Normal behavior. Normal judgment and thought content. CARDIOVASCULAR: Normal heart rate noted, regular rhythm RESPIRATORY: Effort and breath sounds normal, no problems with respiration noted ABDOMEN: Soft, significantly tender in RLQ, nondistended, gravid. well-healed Pfannenstiel incision. PELVIC: Deferred MUSCULOSKELETAL: Normal range of motion. No edema and no tenderness. 2+ distal pulses.   Pertinent Labs/Studies:   CLINICAL DATA:  Abdominal pain for 2 days.  Dermoid cyst seen on CT.  EXAM: TRANSABDOMINAL AND TRANSVAGINAL ULTRASOUND OF PELVIS  DOPPLER ULTRASOUND OF OVARIES  TECHNIQUE: Both transabdominal and transvaginal ultrasound examinations of the pelvis were performed. Transabdominal technique was performed for global imaging of the pelvis including uterus, ovaries, adnexal regions, and pelvic cul-de-sac.  It was necessary to proceed with endovaginal exam following the transabdominal exam to visualize the endometrium and ovaries. Color and duplex Doppler ultrasound was utilized to evaluate blood flow to the ovaries.  COMPARISON:  CT abdomen and pelvis 08/21/2017  FINDINGS: Uterus  Measurements: 8.7 x 6.1 by 5.5 cm. Heterogeneous appearance of the myometrium consistent with uterine fibroids. A posterior myometrial fibroid is measured at 1.6 cm diameter. A posterior lower uterine segment fibroid is measured at 1.1 cm diameter. Small nabothian cysts in the cervix.  Endometrium  Thickness: 5.7 mm.  No focal abnormality visualized.  Right ovary  Measurements: 7.4 x 6.9 x 6.2 cm. Hyperechoic appearance indicating fat content consistent with dermoid cyst as better seen on CT.  Left ovary  Measurements: 2.5 x 1.9 x 2.6 cm. Normal appearance/no adnexal mass.  Pulsed Doppler evaluation of both ovaries demonstrates normal low-resistance arterial and venous  waveforms. The technologist notes that it was difficult to visualize blood flow in the ovaries, likely due to patient's body habitus and pain. No definite evidence of torsion.  Other findings  No abnormal free fluid.  IMPRESSION: 1. Several uterine fibroids. 2. Hyperechoic appearance of the right ovary consistent with dermoid cyst as better seen at CT. 3. Visualization of flow in the ovaries is limited likely due to patient's body habitus and pain during scanning. Arterial and venous flow waveforms are identified in both ovaries, suggesting no evidence of torsion.   Electronically Signed   By: Lucienne Capers M.D.   On: 08/22/2017 00:06      Assessment and Plan :Haley Freeman is a 42 y.o. G3P3 admitted for right lower quadrant pain. Noted to have 7 cm dermoid on ultrasound. With intermittent, severe pain requiring IV pain meds and exam consistent with severe pain. CT with normal appearing appendix. TVUS suggests no evidence of torsion however patient is in significant pain and colicky nature of pain suggests intermittent torsion. Recommended for surgical management, laparoscopic right ovarian detorsion and cystectomy, possible oophorectomy, patient is agreeable. Reviewed that if cyst is encompassing ovary, may proceed with removal of ovary. Reviewed expected fertility outcomes,menopause outcomes with one ovary versus two. The risks of laparoscopic surgery were discussed with the patient including but not limited to: bleeding which may require transfusion or reoperation; infection which may require antibiotics; injury to bowel, bladder, ureters or other surrounding organs; need for additional procedures including laparotomy; thromboembolic phenomenon, incisional problems and other postoperative/anesthesia complications. Patient verbalized understanding of the above and consent signed. Answered all questions.  She is agreeable to blood transfusion in the event of emergency.     Plan for laparoscopic detorsion of ovary, right ovarian cystectomy, possible oophorectomy NPO Admission labs ordered VS Q4   K. Arvilla Meres, M.D. Attending [place], [facility] for [employer], [place]

## 2017-08-22 NOTE — ED Notes (Signed)
[facility] called for transport to [facility] 

## 2017-08-22 NOTE — Transfer of Care (Signed)
Immediate Anesthesia Transfer of Care Note  Patient: Haley Freeman  Procedure(s) Performed: LAPAROSCOPY OPERATIVE removal of right ovary and right dermoid cyst (Right Abdomen)  Patient Location: PACU  Anesthesia Type:General  Level of Consciousness: awake, alert  and oriented  Airway & Oxygen Therapy: Patient Spontanous Breathing and Patient connected to nasal cannula oxygen  Post-op Assessment: Report given to RN, Post -op Vital signs reviewed and stable and Patient moving all extremities  Post vital signs: Reviewed and stable  Last Vitals:  Vitals Value Taken Time  BP 132/86 08/22/2017  1:53 PM  Temp    Pulse 102 08/22/2017  1:54 PM  Resp 22 08/22/2017  1:54 PM  SpO2 99 % 08/22/2017  1:54 PM  Vitals shown include unvalidated device data.  Last Pain:  Vitals:   08/22/17 1022  TempSrc:   PainSc: 0-No pain         Complications: No apparent anesthesia complications

## 2017-08-22 NOTE — Op Note (Signed)
Haley Freeman PROCEDURE DATE: 08/22/2017  PREOPERATIVE DIAGNOSES: suspected right ovarian torsion with dermoid cyst POSTOPERATIVE DIAGNOSES: right dermoid cyst PROCEDURE: laparoscopic right oophorectomy with removal of dermoid cyst SURGEON:  Dr. Feliz Beam ASSISTANT: none ANESTHESIOLOGY TEAM: Anesthesiologist: Freddrick March, MD CRNA: Hewitt Blade, CRNA  INDICATIONS: 42 y.o. G3P3 with aforementioned preoperative diagnoses here today for definitive surgical management.   Risks of surgery were discussed with the patient including but not limited to: bleeding which may require transfusion or reoperation; infection which may require antibiotics; injury to bowel, bladder, ureters or other surrounding organs; need for additional procedures including laparotomy; thromboembolic phenomenon, incisional problems and other postoperative/anesthesia complications. Written informed consent was obtained.    FINDINGS:  Normal appearing abdomen with omental adhesions to anterior abdominal wall in midline below umbilicus, superior to pfannenstiel, normal appearing uterus, normal appearing left ovary and fallopian tube, normal appearing right fallopian tube, enlarged right ovary not torsed with no normal appearing ovarian stroma, dermoid cyst in right ovary present with oily tissue and hair, normal appearing appendix, normal appearing liver. Significant adipose tissue  ANESTHESIA:    General INTRAVENOUS FLUIDS: 1200 ml ESTIMATED BLOOD LOSS: 25 ml SPECIMENS:  Right ovary with dermoid cyst COMPLICATIONS: None immediate   PROCEDURE IN DETAIL:  The patient received intravenous antibiotics and had sequential compression devices applied to her lower extremities while in the preoperative area.  She was then taken to the operating room where general anesthesia was administered and was found to be adequate.  She was placed in the dorsal lithotomy position, and was prepped and draped in a sterile  manner.  A Foley catheter was inserted into her bladder and attached to constant drainage. A timeout was performed to verify patient and procedure. A uterine manipulator was then advanced into the uterus. Attention was then turned to the patient's abdomen where a 5-mm skin incision was made in the umbilical fold.    The 5 mm Optiview port was advanced under visualization with the endoscope until the peritoneum was entered. The introducer was removed and the camera was replaced within the port to confirm intraperitoneum placement. Once this was accomplished, the carbon dioxide gas was insufflated with an opening pressure of 7 mm Hg. The abdomen was visualized and survey of the abdomen showed atraumatic entry. Survey of the abdomen and pelvis were noted as above.   A 5 mm port was placed into the left lower abdomen 2 cm proximal and median from the left ASIS after incision with a scalpel. The port was advanced under direct visualization. A 15 mm port was placed under direct visualization in the right lower abdomen 2 cm proximal and medical to the right ASIS after incision with the scalpel. The patient was placed into trendelenburg and the dermoid/right ovary easily visible. No normal ovarian tissue was visualized with the dermoid appearing to envelope the entire ovary and decision made to perform oophorectomy. The 5 mm Ligasure device was introduced and the tissue just inferior to ovary/dermoid was ligated and transected in a stepwise fashion until the ovary was freed. Hemostasis noted at ligation site. The ovary/dermoid was placed into a 15 mm bag and removed from the abdomen, the dermoid had to be removed in pieces with oily tissue and hair noted. The bag appeared to be removed intact. The pelvis was irrigated and suctioned with hemostasis noted at site of transection. The left tube and ovary were inspected and noted to be normal. The abdomen was again inspected and no bleeding  or other abnormalities noted.  Arista was placed over the transection point of the right ovary for additional hemostasis. At this point the procedure was completed and the trocars were removed from the abdomen. The pneumoperitoneum was removed as much as possible. The fascia of the 15 mm port was closed using 0-Vicryl. The skin of all ports was closed using 4-0 Monocryl. The uterine manipulator was removed and minor amount of bleeding noted a tenaculum site improved with application of silver nitrate.   The patient was extubated without difficulty and taken to PACU in stable condition.   The patient will be discharged to home as per PACU criteria.  Routine postoperative instructions given.  She was prescribed Percocet, Ibuprofen.  She will follow up in the clinic in 2 weeks for postoperative evaluation.   Feliz Beam, M.D. Center for [employer]

## 2017-08-22 NOTE — MAU Provider Note (Signed)
History     CSN: [id number]  Arrival date and time: 08/21/17 1239   First Provider Initiated Contact with Patient 08/22/17 0158      Chief Complaint  Patient presents with  . Chest Pain   HPI Haley Freeman is a 42 y.o. G3P3 non pregnant female who presents from [facility] ER for further evaluation of abdominal pain. She had a full evaluation including labs, ultrasounds, and CT scan. Torsion was ruled out and a ovarian dermoid was seen. She was transferred because the pain persisted.   OB History    Gravida  3   Para  3   Term      Preterm      AB      Living        SAB      TAB      Ectopic      Multiple      Live Births              Past Medical History:  Diagnosis Date  . Alcohol abuse   . Anxiety   . Depression   . Headache(784.0)   . Neuromuscular disorder [facility])     Past Surgical History:  Procedure Laterality Date  . CESAREAN SECTION  '99 and '08   x 2    No family history on file.  Social History   Tobacco Use  . Smoking status: Former Smoker    Packs/day: 0.50  . Smokeless tobacco: Never Used  Substance Use Topics  . Alcohol use: No  . Drug use: Yes    Types: Other-see comments, Marijuana    Comment: Occasionally smokes marijuana    Allergies:  Allergies  Allergen Reactions  . Penicillins     Swells throat    Medications Prior to Admission  Medication Sig Dispense Refill Last Dose  . aspirin-sod bicarb-citric acid (ALKA-SELTZER) 325 MG TBEF tablet Take 325 mg by mouth every 6 (six) hours as needed.   08/21/2017 at Unknown time  . ibuprofen (ADVIL,MOTRIN) 600 MG tablet Take 600 mg by mouth every 6 (six) hours as needed.   08/21/2017 at Unknown time  . mirtazapine (REMERON) 45 MG tablet Take 45 mg by mouth at bedtime.   08/20/2017 at Unknown time  . traZODone (DESYREL) 100 MG tablet Take 100 mg by mouth at bedtime.   08/20/2017 at Unknown time  . azithromycin (ZITHROMAX) 250 MG tablet Take 1 tablet (250 mg total) by  mouth daily. Take first 2 tablets together, then 1 every day until finished. (Patient not taking: Reported on 08/21/2017) 6 tablet 0 Not Taking at Unknown time  . ibuprofen (ADVIL,MOTRIN) 800 MG tablet Take 1 tablet (800 mg total) by mouth 3 (three) times daily. (Patient not taking: Reported on 08/21/2017) 21 tablet 0 Not Taking at Unknown time    Review of Systems  Constitutional: Negative.  Negative for fatigue and fever.  HENT: Negative.   Respiratory: Negative.  Negative for shortness of breath.   Cardiovascular: Negative.  Negative for chest pain.  Gastrointestinal: Positive for abdominal pain. Negative for constipation, diarrhea, nausea and vomiting.  Genitourinary: Negative.  Negative for dysuria.  Neurological: Negative.  Negative for dizziness and headaches.   Physical Exam   Blood pressure (!) 155/90, pulse 77, temperature 98.7 F (37.1 C), resp. rate 20, last menstrual period 08/10/2017, SpO2 100 %.  Physical Exam  Nursing note and vitals reviewed. Constitutional: She is oriented to person, place, and time. She appears well-developed and well-nourished.  No distress.  HENT:  Head: Normocephalic.  Eyes: Pupils are equal, round, and reactive to light.  Cardiovascular: Normal rate, regular rhythm and normal heart sounds.  Respiratory: Effort normal and breath sounds normal. No respiratory distress.  GI: Soft. Bowel sounds are normal. She exhibits no distension. There is tenderness in the right upper quadrant, periumbilical area and suprapubic area. There is guarding. There is no rigidity and no rebound.  Neurological: She is alert and oriented to person, place, and time.  Skin: Skin is warm and dry.  Psychiatric: She has a normal mood and affect. Her behavior is normal. Judgment and thought content normal.    MAU Course  Procedures Results for orders placed or performed during the hospital encounter of 08/21/17 (from the past 24 hour(s))  Basic metabolic panel     Status:  Abnormal   Collection Time: 08/21/17  1:05 PM  Result Value Ref Range   Sodium 139 135 - 145 mmol/L   Potassium 3.7 3.5 - 5.1 mmol/L   Chloride 105 98 - 111 mmol/L   CO2 25 22 - 32 mmol/L   Glucose, Bld 106 (H) 70 - 99 mg/dL   BUN 9 6 - 20 mg/dL   Creatinine, Ser 0.93 0.44 - 1.00 mg/dL   Calcium 9.0 8.9 - 10.3 mg/dL   GFR calc non Af Amer >60 >60 mL/min   GFR calc Af Amer >60 >60 mL/min   Anion gap 9 5 - 15  CBC     Status: Abnormal   Collection Time: 08/21/17  1:05 PM  Result Value Ref Range   WBC 15.8 (H) 4.0 - 10.5 K/uL   RBC 5.06 3.87 - 5.11 MIL/uL   Hemoglobin 10.9 (L) 12.0 - 15.0 g/dL   HCT 37.4 36.0 - 46.0 %   MCV 73.9 (L) 78.0 - 100.0 fL   MCH 21.5 (L) 26.0 - 34.0 pg   MCHC 29.1 (L) 30.0 - 36.0 g/dL   RDW 21.8 (H) 11.5 - 15.5 %   Platelets 353 150 - 400 K/uL  I-stat troponin, ED     Status: None   Collection Time: 08/21/17  1:05 PM  Result Value Ref Range   Troponin i, poc 0.01 0.00 - 0.08 ng/mL   Comment 3          Hepatic function panel     Status: Abnormal   Collection Time: 08/21/17  1:05 PM  Result Value Ref Range   Total Protein 7.6 6.5 - 8.1 g/dL   Albumin 3.5 3.5 - 5.0 g/dL   AST 27 15 - 41 U/L   ALT 19 0 - 44 U/L   Alkaline Phosphatase 125 38 - 126 U/L   Total Bilirubin 0.2 (L) 0.3 - 1.2 mg/dL   Bilirubin, Direct <0.1 0.0 - 0.2 mg/dL   Indirect Bilirubin NOT CALCULATED 0.3 - 0.9 mg/dL  Lipase, blood     Status: None   Collection Time: 08/21/17  1:05 PM  Result Value Ref Range   Lipase 21 11 - 51 U/L  I-Stat beta hCG blood, ED     Status: None   Collection Time: 08/21/17  1:06 PM  Result Value Ref Range   I-stat hCG, quantitative <5.0 <5 mIU/mL   Comment 3          I-Stat Troponin, ED (not at [facility])     Status: None   Collection Time: 08/21/17  7:24 PM  Result Value Ref Range   Troponin i, poc 0.00 0.00 - 0.08 ng/mL  Comment 3          Urinalysis, Routine w reflex microscopic     Status: Abnormal   Collection Time: 08/21/17  7:28 PM  Result  Value Ref Range   Color, Urine YELLOW YELLOW   APPearance HAZY (A) CLEAR   Specific Gravity, Urine 1.021 1.005 - 1.030   pH 6.0 5.0 - 8.0   Glucose, UA NEGATIVE NEGATIVE mg/dL   Hgb urine dipstick NEGATIVE NEGATIVE   Bilirubin Urine NEGATIVE NEGATIVE   Ketones, ur 20 (A) NEGATIVE mg/dL   Protein, ur NEGATIVE NEGATIVE mg/dL   Nitrite NEGATIVE NEGATIVE   Leukocytes, UA NEGATIVE NEGATIVE   Dg Chest 2 View  Result Date: 08/21/2017 CLINICAL DATA:  Chest pain for 4 days EXAM: CHEST - 2 VIEW COMPARISON:  None. FINDINGS: Lungs are clear. Heart size and pulmonary vascularity are normal. No adenopathy. There is upper thoracic dextroscoliosis. No pneumothorax. No bone lesions. IMPRESSION: No edema or consolidation. Electronically Signed   By: Lowella Grip III M.D.   On: 08/21/2017 13:18   US Transvaginal Non-ob  Result Date: 08/22/2017 CLINICAL DATA:  Abdominal pain for 2 days.  Dermoid cyst seen on CT. EXAM: TRANSABDOMINAL AND TRANSVAGINAL ULTRASOUND OF PELVIS DOPPLER ULTRASOUND OF OVARIES TECHNIQUE: Both transabdominal and transvaginal ultrasound examinations of the pelvis were performed. Transabdominal technique was performed for global imaging of the pelvis including uterus, ovaries, adnexal regions, and pelvic cul-de-sac. It was necessary to proceed with endovaginal exam following the transabdominal exam to visualize the endometrium and ovaries. Color and duplex Doppler ultrasound was utilized to evaluate blood flow to the ovaries. COMPARISON:  CT abdomen and pelvis 08/21/2017 FINDINGS: Uterus Measurements: 8.7 x 6.1 by 5.5 cm. Heterogeneous appearance of the myometrium consistent with uterine fibroids. A posterior myometrial fibroid is measured at 1.6 cm diameter. A posterior lower uterine segment fibroid is measured at 1.1 cm diameter. Small nabothian cysts in the cervix. Endometrium Thickness: 5.7 mm.  No focal abnormality visualized. Right ovary Measurements: 7.4 x 6.9 x 6.2 cm. Hyperechoic  appearance indicating fat content consistent with dermoid cyst as better seen on CT. Left ovary Measurements: 2.5 x 1.9 x 2.6 cm. Normal appearance/no adnexal mass. Pulsed Doppler evaluation of both ovaries demonstrates normal low-resistance arterial and venous waveforms. The technologist notes that it was difficult to visualize blood flow in the ovaries, likely due to patient's body habitus and pain. No definite evidence of torsion. Other findings No abnormal free fluid. IMPRESSION: 1. Several uterine fibroids. 2. Hyperechoic appearance of the right ovary consistent with dermoid cyst as better seen at CT. 3. Visualization of flow in the ovaries is limited likely due to patient's body habitus and pain during scanning. Arterial and venous flow waveforms are identified in both ovaries, suggesting no evidence of torsion. Electronically Signed   By: Lucienne Capers M.D.   On: 08/22/2017 00:06   US Pelvis Complete  Result Date: 08/22/2017 CLINICAL DATA:  Abdominal pain for 2 days.  Dermoid cyst seen on CT. EXAM: TRANSABDOMINAL AND TRANSVAGINAL ULTRASOUND OF PELVIS DOPPLER ULTRASOUND OF OVARIES TECHNIQUE: Both transabdominal and transvaginal ultrasound examinations of the pelvis were performed. Transabdominal technique was performed for global imaging of the pelvis including uterus, ovaries, adnexal regions, and pelvic cul-de-sac. It was necessary to proceed with endovaginal exam following the transabdominal exam to visualize the endometrium and ovaries. Color and duplex Doppler ultrasound was utilized to evaluate blood flow to the ovaries. COMPARISON:  CT abdomen and pelvis 08/21/2017 FINDINGS: Uterus Measurements: 8.7 x 6.1 by 5.5  cm. Heterogeneous appearance of the myometrium consistent with uterine fibroids. A posterior myometrial fibroid is measured at 1.6 cm diameter. A posterior lower uterine segment fibroid is measured at 1.1 cm diameter. Small nabothian cysts in the cervix. Endometrium Thickness: 5.7 mm.  No  focal abnormality visualized. Right ovary Measurements: 7.4 x 6.9 x 6.2 cm. Hyperechoic appearance indicating fat content consistent with dermoid cyst as better seen on CT. Left ovary Measurements: 2.5 x 1.9 x 2.6 cm. Normal appearance/no adnexal mass. Pulsed Doppler evaluation of both ovaries demonstrates normal low-resistance arterial and venous waveforms. The technologist notes that it was difficult to visualize blood flow in the ovaries, likely due to patient's body habitus and pain. No definite evidence of torsion. Other findings No abnormal free fluid. IMPRESSION: 1. Several uterine fibroids. 2. Hyperechoic appearance of the right ovary consistent with dermoid cyst as better seen at CT. 3. Visualization of flow in the ovaries is limited likely due to patient's body habitus and pain during scanning. Arterial and venous flow waveforms are identified in both ovaries, suggesting no evidence of torsion. Electronically Signed   By: Lucienne Capers M.D.   On: 08/22/2017 00:06   Ct Abdomen Pelvis W Contrast  Addendum Date: 08/21/2017   ADDENDUM REPORT: 08/21/2017 21:08 ADDENDUM: Surgical consultation regarding the ovarian dermoid may be helpful given the potential risk for torsion and small risk of malignant degeneration. Electronically Signed   By: Inez Catalina M.D.   On: 08/21/2017 21:08   Result Date: 08/21/2017 CLINICAL DATA:  Epigastric pain and vomiting for 2 days EXAM: CT ABDOMEN AND PELVIS WITH CONTRAST TECHNIQUE: Multidetector CT imaging of the abdomen and pelvis was performed using the standard protocol following bolus administration of intravenous contrast. CONTRAST:  159mL OMNIPAQUE IOHEXOL 300 MG/ML  SOLN COMPARISON:  Ultrasound from earlier in the same day, CT from 01/01/2007. FINDINGS: Lower chest: Lung bases are free of acute infiltrate or sizable effusion. Hepatobiliary: The gallbladder is within normal limits. The liver is well visualized and demonstrates a vague 1.6 cm hypodensity in the  posterior aspect of the right lobe near the dome which corresponds to the hyperechoic focus on recent ultrasound examination. Some peripheral enhancement is seen. These changes are most consistent with a hemangioma. Pancreas: Unremarkable. No pancreatic ductal dilatation or surrounding inflammatory changes. Spleen: Normal in size without focal abnormality. Adrenals/Urinary Tract: Adrenal glands are within normal limits. The kidneys demonstrate no renal calculi or obstructive changes. The bladder is partially distended. Stomach/Bowel: The appendix is within normal limits. No obstructive or inflammatory changes of the bowel are seen. Stomach is within normal limits. Vascular/Lymphatic: No significant vascular findings are present. No enlarged abdominal or pelvic lymph nodes. Reproductive: Uterus is within normal limits. The left ovary is unremarkable. The right ovary now demonstrates a 7 cm predominately fatty lesion with central soft tissue density and calcification consistent with an ovarian dermoid. Other: No abdominal wall hernia or abnormality. No abdominopelvic ascites. Musculoskeletal: No acute or significant osseous findings. IMPRESSION: No focal abnormality to correspond with the patient's given clinical history is noted. Hypodensity within the liver consistent with a small hemangioma. Follow-up ultrasound in 6 months is recommended for further evaluation given the patient's age in lack of neoplastic history. Predominately fatty lesion with central calcification and soft tissue density on the right consistent with an ovarian dermoid. This could be confirmed with nonemergent ultrasound. No other focal abnormality is noted. Electronically Signed: By: Inez Catalina M.D. On: 08/21/2017 21:04   US Abdomen Limited  Result Date:  08/21/2017 CLINICAL DATA:  Right upper quadrant pain for 2 days EXAM: ULTRASOUND ABDOMEN LIMITED RIGHT UPPER QUADRANT COMPARISON:  CT 01/01/2007 FINDINGS: Gallbladder: No gallstones or  wall thickening visualized. No sonographic Murphy sign noted by sonographer. Common bile duct: Diameter: 3 mm Liver: Echogenicity within normal limits. Hyperechoic mass in the posterior right hepatic lobe measuring 1.9 x 1.8 x 1.6 cm. Portal vein is patent on color Doppler imaging with normal direction of blood flow towards the liver. IMPRESSION: 1. Negative for gallstones or biliary dilatation. 2. 1.9 cm hypoechoic mass in the posterior right hepatic lobe, possible hemangioma. Could be evaluated with MRI on a nonemergent basis. Electronically Signed   By: Donavan Foil M.D.   On: 08/21/2017 20:33   [name] Art/ven Flow Abd Pelv Doppler  Result Date: 08/22/2017 CLINICAL DATA:  Abdominal pain for 2 days.  Dermoid cyst seen on CT. EXAM: TRANSABDOMINAL AND TRANSVAGINAL ULTRASOUND OF PELVIS DOPPLER ULTRASOUND OF OVARIES TECHNIQUE: Both transabdominal and transvaginal ultrasound examinations of the pelvis were performed. Transabdominal technique was performed for global imaging of the pelvis including uterus, ovaries, adnexal regions, and pelvic cul-de-sac. It was necessary to proceed with endovaginal exam following the transabdominal exam to visualize the endometrium and ovaries. Color and duplex Doppler ultrasound was utilized to evaluate blood flow to the ovaries. COMPARISON:  CT abdomen and pelvis 08/21/2017 FINDINGS: Uterus Measurements: 8.7 x 6.1 by 5.5 cm. Heterogeneous appearance of the myometrium consistent with uterine fibroids. A posterior myometrial fibroid is measured at 1.6 cm diameter. A posterior lower uterine segment fibroid is measured at 1.1 cm diameter. Small nabothian cysts in the cervix. Endometrium Thickness: 5.7 mm.  No focal abnormality visualized. Right ovary Measurements: 7.4 x 6.9 x 6.2 cm. Hyperechoic appearance indicating fat content consistent with dermoid cyst as better seen on CT. Left ovary Measurements: 2.5 x 1.9 x 2.6 cm. Normal appearance/no adnexal mass. Pulsed Doppler evaluation of  both ovaries demonstrates normal low-resistance arterial and venous waveforms. The technologist notes that it was difficult to visualize blood flow in the ovaries, likely due to patient's body habitus and pain. No definite evidence of torsion. Other findings No abnormal free fluid. IMPRESSION: 1. Several uterine fibroids. 2. Hyperechoic appearance of the right ovary consistent with dermoid cyst as better seen at CT. 3. Visualization of flow in the ovaries is limited likely due to patient's body habitus and pain during scanning. Arterial and venous flow waveforms are identified in both ovaries, suggesting no evidence of torsion. Electronically Signed   By: Lucienne Capers M.D.   On: 08/22/2017 00:06    MDM Toradol 60mg  IM Zofran 4mg  IV LR infusion  Patient reports minimal relief from pain. After 1 hour, cyclic pain increasing causing vomiting returned.  Consulted with Dr. Ihor Dow- at bedside to see patient. Suspected ovarian torsion- will give dilaudid and phenergan PRN and plan for surgery in AM.   Dilaudid 2mg  IV q2hr Phenergan 25mg  IV q4hr  Assessment and Plan   1. Ovarian torsion   2. RLQ abdominal pain   3. Dermoid cyst of right ovary    -Surgical management of suspected ovarian torsion -NPO -Pain management PRN  Wende Mott CNM 08/22/2017, 1:58 AM

## 2017-08-23 ENCOUNTER — Encounter (HOSPITAL_COMMUNITY): Payer: Self-pay | Admitting: Obstetrics and Gynecology

## 2017-08-25 NOTE — Addendum Note (Signed)
Addendum  created 08/25/17 1842 by Asher Muir, CRNA   Charge Capture section accepted

## 2017-09-03 ENCOUNTER — Emergency Department (HOSPITAL_COMMUNITY): Payer: Medicare Other

## 2017-09-03 ENCOUNTER — Other Ambulatory Visit: Payer: Self-pay

## 2017-09-03 ENCOUNTER — Emergency Department (HOSPITAL_COMMUNITY)
Admission: EM | Admit: 2017-09-03 | Discharge: 2017-09-03 | Disposition: A | Discharge status: Home / Self Care | Payer: Medicare Other | Attending: Emergency Medicine | Admitting: Emergency Medicine

## 2017-09-03 ENCOUNTER — Encounter (HOSPITAL_COMMUNITY): Payer: Self-pay

## 2017-09-03 DIAGNOSIS — R102 Pelvic and perineal pain: Secondary | ICD-10-CM

## 2017-09-03 DIAGNOSIS — R1030 Lower abdominal pain, unspecified: Secondary | ICD-10-CM

## 2017-09-03 DIAGNOSIS — D509 Iron deficiency anemia, unspecified: Secondary | ICD-10-CM | POA: Diagnosis not present

## 2017-09-03 DIAGNOSIS — F12988 Cannabis use, unspecified with other cannabis-induced disorder: Secondary | ICD-10-CM

## 2017-09-03 DIAGNOSIS — Z87891 Personal history of nicotine dependence: Secondary | ICD-10-CM | POA: Diagnosis not present

## 2017-09-03 DIAGNOSIS — R109 Unspecified abdominal pain: Secondary | ICD-10-CM | POA: Diagnosis not present

## 2017-09-03 DIAGNOSIS — D259 Leiomyoma of uterus, unspecified: Secondary | ICD-10-CM | POA: Diagnosis not present

## 2017-09-03 DIAGNOSIS — R1013 Epigastric pain: Secondary | ICD-10-CM

## 2017-09-03 DIAGNOSIS — D72829 Elevated white blood cell count, unspecified: Secondary | ICD-10-CM | POA: Diagnosis not present

## 2017-09-03 DIAGNOSIS — Z79899 Other long term (current) drug therapy: Secondary | ICD-10-CM | POA: Insufficient documentation

## 2017-09-03 DIAGNOSIS — G8918 Other acute postprocedural pain: Secondary | ICD-10-CM | POA: Insufficient documentation

## 2017-09-03 DIAGNOSIS — R112 Nausea with vomiting, unspecified: Secondary | ICD-10-CM | POA: Insufficient documentation

## 2017-09-03 LAB — COMPREHENSIVE METABOLIC PANEL
ALT: 15 U/L (ref 0–44)
AST: 23 U/L (ref 15–41)
Albumin: 3.4 g/dL — ABNORMAL LOW (ref 3.5–5.0)
Alkaline Phosphatase: 116 U/L (ref 38–126)
Anion gap: 13 (ref 5–15)
BILIRUBIN TOTAL: 0.4 mg/dL (ref 0.3–1.2)
BUN: 8 mg/dL (ref 6–20)
CO2: 20 mmol/L — ABNORMAL LOW (ref 22–32)
Calcium: 8.9 mg/dL (ref 8.9–10.3)
Chloride: 105 mmol/L (ref 98–111)
Creatinine, Ser: 0.99 mg/dL (ref 0.44–1.00)
GFR calc Af Amer: >60 mL/min (ref >60)
GFR calc non Af Amer: >60 mL/min (ref >60)
Glucose, Bld: 146 mg/dL — ABNORMAL HIGH (ref 70–99)
POTASSIUM: 3.6 mmol/L (ref 3.5–5.1)
Sodium: 138 mmol/L (ref 135–145)
TOTAL PROTEIN: 7.2 g/dL (ref 6.5–8.1)

## 2017-09-03 LAB — RAPID URINE DRUG SCREEN, HOSP PERFORMED
Amphetamines: NOT DETECTED
Barbiturates: NOT DETECTED
Benzodiazepines: NOT DETECTED
COCAINE: NOT DETECTED
OPIATES: POSITIVE — AB
TETRAHYDROCANNABINOL: POSITIVE — AB

## 2017-09-03 LAB — CBC
HEMATOCRIT: 34.8 % — AB (ref 36.0–46.0)
Hemoglobin: 10.1 g/dL — ABNORMAL LOW (ref 12.0–15.0)
MCH: 21.4 pg — ABNORMAL LOW (ref 26.0–34.0)
MCHC: 29 g/dL — ABNORMAL LOW (ref 30.0–36.0)
MCV: 73.9 fL — ABNORMAL LOW (ref 78.0–100.0)
PLATELETS: 368 10*3/uL (ref 150–400)
RBC: 4.71 MIL/uL (ref 3.87–5.11)
RDW: 20.6 % — AB (ref 11.5–15.5)
WBC: 18.8 10*3/uL — AB (ref 4.0–10.5)

## 2017-09-03 LAB — URINALYSIS, ROUTINE W REFLEX MICROSCOPIC
Bilirubin Urine: NEGATIVE
Glucose, UA: NEGATIVE mg/dL
Hgb urine dipstick: NEGATIVE
KETONES UR: 5 mg/dL — AB
LEUKOCYTES UA: NEGATIVE
NITRITE: NEGATIVE
PH: 8 (ref 5.0–8.0)
PROTEIN: NEGATIVE mg/dL
Specific Gravity, Urine: 1.019 (ref 1.005–1.030)

## 2017-09-03 LAB — DIFFERENTIAL
Abs Immature Granulocytes: 0.1 10*3/uL (ref 0.0–0.1)
BASOS ABS: 0.1 10*3/uL (ref 0.0–0.1)
BASOS PCT: 1 %
EOS ABS: 0.3 10*3/uL (ref 0.0–0.7)
Eosinophils Relative: 1 %
Immature Granulocytes: 1 %
Lymphocytes Relative: 18 %
Lymphs Abs: 3.4 10*3/uL (ref 0.7–4.0)
MONO ABS: 0.6 10*3/uL (ref 0.1–1.0)
MONOS PCT: 3 %
NEUTROS PCT: 76 %
Neutro Abs: 14.3 10*3/uL — ABNORMAL HIGH (ref 1.7–7.7)

## 2017-09-03 LAB — HCG, QUANTITATIVE, PREGNANCY: hCG, Beta Chain, Quant, S: 1 m[IU]/mL (ref <5)

## 2017-09-03 LAB — I-STAT BETA HCG BLOOD, ED (MC, WL, AP ONLY): I-stat hCG, quantitative: 8.2 m[IU]/mL — ABNORMAL HIGH (ref <5)

## 2017-09-03 LAB — LIPASE, BLOOD: LIPASE: 34 U/L (ref 11–51)

## 2017-09-03 MED ORDER — LORAZEPAM 2 MG/ML IJ SOLN
1.0000 mg | Freq: Once | INTRAMUSCULAR | Status: AC
  Administered 2017-09-03: 1 mg via INTRAVENOUS
  Filled 2017-09-03: qty 1

## 2017-09-03 MED ORDER — SODIUM CHLORIDE 0.9 % IV BOLUS
1000.0000 mL | Freq: Once | INTRAVENOUS | Status: AC
  Administered 2017-09-03: 1000 mL via INTRAVENOUS

## 2017-09-03 MED ORDER — METOCLOPRAMIDE HCL 5 MG/ML IJ SOLN
10.0000 mg | Freq: Once | INTRAMUSCULAR | Status: AC
  Administered 2017-09-03: 10 mg via INTRAVENOUS
  Filled 2017-09-03: qty 2

## 2017-09-03 MED ORDER — IOPAMIDOL (ISOVUE-370) INJECTION 76%
INTRAVENOUS | Status: AC
  Filled 2017-09-03: qty 100

## 2017-09-03 MED ORDER — HALOPERIDOL LACTATE 5 MG/ML IJ SOLN
1.0000 mg | Freq: Once | INTRAMUSCULAR | Status: AC
  Administered 2017-09-03: 1 mg via INTRAVENOUS
  Filled 2017-09-03: qty 1

## 2017-09-03 MED ORDER — HYDROMORPHONE HCL 1 MG/ML IJ SOLN
1.0000 mg | Freq: Once | INTRAMUSCULAR | Status: AC
  Administered 2017-09-03: 1 mg via INTRAVENOUS
  Filled 2017-09-03: qty 1

## 2017-09-03 MED ORDER — CAPSAICIN 0.025 % EX CREA
TOPICAL_CREAM | Freq: Once | CUTANEOUS | Status: AC
  Administered 2017-09-03: 16:00:00 via TOPICAL
  Filled 2017-09-03: qty 60

## 2017-09-03 MED ORDER — CAPSAICIN 0.075 % EX CREA
TOPICAL_CREAM | Freq: Once | CUTANEOUS | Status: DC
  Filled 2017-09-03 (×2): qty 60

## 2017-09-03 MED ORDER — IOPAMIDOL (ISOVUE-300) INJECTION 61%
INTRAVENOUS | Status: AC
  Filled 2017-09-03: qty 30

## 2017-09-03 MED ORDER — DIPHENHYDRAMINE HCL 50 MG/ML IJ SOLN
25.0000 mg | Freq: Once | INTRAMUSCULAR | Status: AC
  Administered 2017-09-03: 25 mg via INTRAVENOUS
  Filled 2017-09-03: qty 1

## 2017-09-03 MED ORDER — PROMETHAZINE HCL 25 MG/ML IJ SOLN
25.0000 mg | Freq: Once | INTRAMUSCULAR | Status: AC
  Administered 2017-09-03: 25 mg via INTRAVENOUS
  Filled 2017-09-03: qty 1

## 2017-09-03 MED ORDER — PROCHLORPERAZINE EDISYLATE 10 MG/2ML IJ SOLN
10.0000 mg | Freq: Once | INTRAMUSCULAR | Status: AC
  Administered 2017-09-03: 10 mg via INTRAVENOUS
  Filled 2017-09-03: qty 2

## 2017-09-03 MED ORDER — FAMOTIDINE IN NACL 20-0.9 MG/50ML-% IV SOLN
20.0000 mg | Freq: Once | INTRAVENOUS | Status: AC
  Administered 2017-09-03: 20 mg via INTRAVENOUS
  Filled 2017-09-03: qty 50

## 2017-09-03 MED ORDER — IOPAMIDOL (ISOVUE-300) INJECTION 61%
INTRAVENOUS | Status: AC
  Filled 2017-09-03: qty 100

## 2017-09-03 MED ORDER — ONDANSETRON 8 MG PO TBDP: 8.0000 mg | ORAL_TABLET | Freq: Three times a day (TID) | ORAL | 0 refills | Status: DC | PRN

## 2017-09-03 MED ORDER — PANTOPRAZOLE SODIUM 40 MG PO TBEC: 40.0000 mg | DELAYED_RELEASE_TABLET | Freq: Every day | ORAL | 0 refills | Status: DC

## 2017-09-03 MED ORDER — IOPAMIDOL (ISOVUE-300) INJECTION 61%
100.0000 mL | Freq: Once | INTRAVENOUS | Status: AC
  Administered 2017-09-03: 100 mL via INTRAVENOUS

## 2017-09-03 MED ORDER — ONDANSETRON HCL 4 MG/2ML IJ SOLN
4.0000 mg | Freq: Once | INTRAMUSCULAR | Status: AC | PRN
  Administered 2017-09-03: 4 mg via INTRAVENOUS
  Filled 2017-09-03: qty 2

## 2017-09-03 MED ORDER — ONDANSETRON 4 MG PO TBDP
8.0000 mg | ORAL_TABLET | Freq: Once | ORAL | Status: AC
  Administered 2017-09-03: 8 mg via ORAL
  Filled 2017-09-03: qty 2

## 2017-09-03 NOTE — ED Provider Notes (Signed)
[place] EMERGENCY DEPARTMENT Provider Note   CSN: [id number] Arrival date & time: 09/03/17  0245     History   Chief Complaint Chief Complaint  Patient presents with  . Abdominal Pain  . Chest Pain    HPI Haley Freeman is a 42 y.o. female.  The history is provided by the patient.  She has history of anxiety and depression and is approximately 10 days post laparoscopic right oophorectomy with removal of dermoid cyst and comes in with recurrent abdominal pain.  Pain is similar to what she had prior to her surgery, but is now left-sided.  Pain started about 9 PM.  Pain is sharp and severe and she rates it at 10/10.  There is associated nausea with dry heaves.  She denies fever or chills.  She is unable to get comfortable.  She has not taken anything at home.  Past Medical History:  Diagnosis Date  . Alcohol abuse   . Anxiety   . Depression   . Headache(784.0)   . Neuromuscular disorder [facility])     Patient Active Problem List   Diagnosis Date Noted  . Dermoid 08/22/2017    Past Surgical History:  Procedure Laterality Date  . CESAREAN SECTION  '99 and '08   x 2  . LAPAROSCOPY Right 08/22/2017   Procedure: LAPAROSCOPY OPERATIVE removal of right ovary and right dermoid cyst;  Surgeon: Sloan Leiter, MD;  Location: [facility];  Service: Gynecology;  Laterality: Right;     OB History    Gravida  3   Para  3   Term      Preterm      AB      Living        SAB      TAB      Ectopic      Multiple      Live Births               Home Medications    Prior to Admission medications   Medication Sig Start Date End Date Taking? Authorizing Provider  ibuprofen (ADVIL,MOTRIN) 600 MG tablet Take 600 mg by mouth every 6 (six) hours as needed for mild pain.    Yes [provider]  oxyCODONE-acetaminophen (PERCOCET/ROXICET) 5-325 MG tablet Take 1 tablet by mouth every 6 (six) hours as needed. 08/22/17  Yes Sloan Leiter, MD    traZODone (DESYREL) 100 MG tablet Take 100 mg by mouth at bedtime.   Yes [provider]    Family History History reviewed. No pertinent family history.  Social History Social History   Tobacco Use  . Smoking status: Former Smoker    Packs/day: 0.50  . Smokeless tobacco: Never Used  Substance Use Topics  . Alcohol use: No  . Drug use: Yes    Types: Other-see comments, Marijuana    Comment: Occasionally smokes marijuana     Allergies   Penicillins   Review of Systems Review of Systems  All other systems reviewed and are negative.    Physical Exam Updated Vital Signs BP (!) 155/104   Pulse 89   Temp 98 F (36.7 C) (Oral)   Resp 18   Ht 5\' 8"  (1.727 m)   Wt (!) 145.2 kg   LMP 08/10/2017   SpO2 99%   BMI 48.66 kg/m   Physical Exam  Nursing note and vitals reviewed.  Morbidly obese 42 year old female, appears uncomfortable, but is in no acute distress.  Vital signs are significant for elevated blood pressure. Oxygen saturation is 99%, which is normal. Head is normocephalic and atraumatic. PERRLA, EOMI. Oropharynx is clear. Neck is nontender and supple without adenopathy or JVD. Back is nontender and there is no CVA tenderness. Lungs are clear without rales, wheezes, or rhonchi. Chest is nontender. Heart has regular rate and rhythm without murmur. Abdomen is soft, flat, with moderate left lower quadrant tenderness.  There is no rebound or guarding.  There are no masses or hepatosplenomegaly and peristalsis is hypoactive. Extremities have no cyanosis or edema, full range of motion is present. Skin is warm and dry without rash. Neurologic: Mental status is normal, cranial nerves are intact, there are no motor or sensory deficits.  ED Treatments / Results  Labs (all labs ordered are listed, but only abnormal results are displayed) Labs Reviewed  COMPREHENSIVE METABOLIC PANEL - Abnormal; Notable for the following components:      Result Value   CO2  20 (*)    Glucose, Bld 146 (*)    Albumin 3.4 (*)    All other components within normal limits  CBC - Abnormal; Notable for the following components:   WBC 18.8 (*)    Hemoglobin 10.1 (*)    HCT 34.8 (*)    MCV 73.9 (*)    MCH 21.4 (*)    MCHC 29.0 (*)    RDW 20.6 (*)    All other components within normal limits  URINALYSIS, ROUTINE W REFLEX MICROSCOPIC - Abnormal; Notable for the following components:   Ketones, ur 5 (*)    All other components within normal limits  DIFFERENTIAL - Abnormal; Notable for the following components:   Neutro Abs 14.3 (*)    All other components within normal limits  RAPID URINE DRUG SCREEN, HOSP PERFORMED - Abnormal; Notable for the following components:   Opiates POSITIVE (*)    Tetrahydrocannabinol POSITIVE (*)    All other components within normal limits  I-STAT BETA HCG BLOOD, ED ([facility], [facility], [facility] ONLY) - Abnormal; Notable for the following components:   I-stat hCG, quantitative 8.2 (*)    All other components within normal limits  LIPASE, BLOOD  HCG, QUANTITATIVE, PREGNANCY    EKG EKG Interpretation  Date/Time:  Wednesday September 03 2017 02:58:25 EDT Ventricular Rate:  85 PR Interval:    QRS Duration: 94 QT Interval:  378 QTC Calculation: 450 R Axis:   85 Text Interpretation:  Sinus rhythm Normal ECG When compared with ECG of 08/21/2017, No significant change was found Confirmed by Delora Fuel ([id number]) on 09/03/2017 3:05:56 AM   Radiology US Pelvic Complete W Transvaginal And Torsion R/o  Result Date: 09/03/2017 CLINICAL DATA:  Pelvic pain beginning this morning. History of right oophorectomy for dermoid cyst on August 22, 2017. EXAM: TRANSABDOMINAL AND TRANSVAGINAL ULTRASOUND OF PELVIS DOPPLER ULTRASOUND OF OVARIES TECHNIQUE: Both transabdominal and transvaginal ultrasound examinations of the pelvis were performed. Transabdominal technique was performed for global imaging of the pelvis including uterus, ovaries, adnexal regions, and pelvic  cul-de-sac. It was necessary to proceed with endovaginal exam following the transabdominal exam to visualize the endometrium and left ovary and bilateral adnexal regions. Color and duplex Doppler ultrasound was utilized to evaluate blood flow to the ovaries. COMPARISON:  Pelvic ultrasound of August 21, 2017 FINDINGS: Uterus Measurements: 8.8 x 5.1 x 6.3 cm. Two fibroids are noted posteriorly in the uterine body. 1 measures 1.8 and the other 1.2 cm in greatest dimension. Endometrium Thickness: 12.7 mm.  No focal abnormality  visualized. Right ovary Measurements: The right ovary is surgically absent. No right adnexal mass is observed. Left ovary Measurements: 2.8 x 2.1 x 3.1 cm. Vascularity of the left ovary is demonstrated with some difficulty with pulsed Doppler. It is felt to be within the limits of normal. The patient's body habitus as well as tenderness to palpation limits the study. No left ovarian mass is observed. Other findings No abnormal free fluid. IMPRESSION: Right oophorectomy for dermoid 12 days ago.  No right adnexal mass. As best as can be determined vascularity of the normal-appearing left ovary is normal. No left adnexal mass or free pelvic fluid is observed. The uterus and endometrium exhibit no acute abnormalities. There are known posterior uterine fibroids. Electronically Signed   By: Burdette Forehand  [place] M.D.   On: 09/03/2017 07:16    Procedures Procedures (including critical care time)  Medications Ordered in ED Medications  iopamidol (ISOVUE-300) 61 % injection (has no administration in time range)  ondansetron (ZOFRAN) injection 4 mg (4 mg Intravenous Given 09/03/17 0259)  metoCLOPramide (REGLAN) injection 10 mg (10 mg Intravenous Given 09/03/17 0335)  diphenhydrAMINE (BENADRYL) injection 25 mg (25 mg Intravenous Given 09/03/17 0336)  HYDROmorphone (DILAUDID) injection 1 mg (1 mg Intravenous Given 09/03/17 0510)  prochlorperazine (COMPAZINE) injection 10 mg (10 mg Intravenous Given 09/03/17  0509)  HYDROmorphone (DILAUDID) injection 1 mg (1 mg Intravenous Given 09/03/17 0738)  LORazepam (ATIVAN) injection 1 mg (1 mg Intravenous Given 09/03/17 0738)     Initial Impression / Assessment and Plan / ED Course  I have reviewed the triage vital signs and the nursing notes.  Pertinent labs & imaging results that were available during my care of the patient were reviewed by me and considered in my medical decision making (see chart for details).  Recurrent abdominal pain.  Old records reviewed, and there had been concerns that she was having intermittent ovarian torsion on the right, which is why that ovary was removed.  Thus far, she has been given ondansetron and metoclopramide without improvement in the nausea.  She will be given hydromorphone for pain and chlorpromazine for nausea.  Will get ultrasound of the pelvis with Doppler flow to rule out torsion of the left ovary.  Also, of note, CT of abdomen and pelvis obtained on August 15 showed no evidence of renal calculi.  Labs today do show a significant leukocytosis, but this is not significantly changed from August 16.  Mild anemia is present and unchanged.  Patient has required additional antiemetics.  After prochlorperazine, she continues to complain of nausea and she is given a dose of lorazepam.  She is also complaining of ongoing pain and required an additional dose of hydromorphone.  Ultrasound is unrevealing, will send for CT of abdomen and pelvis looking for any complications of recent surgery that might explain the pain.  Case is signed out to Dr. Ashok Cordia.  Final Clinical Impressions(s) / ED Diagnoses   Final diagnoses:  Pelvic pain in female  Lower abdominal pain  Intractable vomiting with nausea, unspecified vomiting type  Microcytic anemia  Leukocytosis, unspecified type    ED Discharge Orders    None       Delora Fuel, MD 83/41/96 [phone]

## 2017-09-03 NOTE — Discharge Instructions (Addendum)
It was our pleasure to provide your ER care today - we hope that you feel better.  Take zofran as need for nausea. Take acid blocker medication as prescribed.  You may also take acetaminophen as need for pain.  Increasingly, we are seeing marijuana use as the cause of a recurrent upper abdominal pain and vomiting syndrome. Avoid marijuana use as that may cause recurrence and/or worsening of your symptoms.   Follow up with primary care doctor in 1 week.   Return to ER if worse, new symptoms, fevers, persistent vomiting, other concern.   You were given pain medication in the ER - no driving for the next 6 hours.

## 2017-09-03 NOTE — ED Notes (Signed)
MD at bedside. 

## 2017-09-03 NOTE — ED Provider Notes (Signed)
Pt signed out that CT was pending, to check when resulted.  Ct is negative for acute process.  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 UDS is positive for THC.   On recheck abd soft nt. Tolerating po. Pt requests rx for home.      Lajean Saver, MD 09/03/17 1104

## 2017-09-03 NOTE — ED Notes (Signed)
Capsicum cream requested again from pharmacy.

## 2017-09-03 NOTE — ED Triage Notes (Signed)
Per ems pt had left ovary removed laparoscopic 2 weeks ago.  Tonight pt had Sudden onset LLQ pain radiating to central chest 10/10. Also c/o n&v

## 2017-09-03 NOTE — ED Notes (Signed)
Pt awake.  Requesting nausea medication.  Pt continues to move constantly in bed, pulling off all monitoring devices.  3rd IV almost pulled out.  Meds requested from MD.

## 2017-09-03 NOTE — ED Notes (Signed)
Patient vomited 200 mL ; MD made aware.

## 2017-09-03 NOTE — ED Notes (Signed)
No emesis noted since medication given .  Pt sleeping.

## 2017-09-03 NOTE — ED Notes (Signed)
Patient unable to sit still due to pain.

## 2017-09-03 NOTE — ED Notes (Signed)
Patient transported to CT 

## 2017-09-03 NOTE — ED Notes (Signed)
Roxanne Mins, MD aware of pt's continued pain, N/V, anxiety. Pt requesting pain medication, "even if it's just ibuprofen."

## 2017-09-03 NOTE — ED Notes (Signed)
PT up for discharge.  Actively vomiting.

## 2017-09-03 NOTE — ED Notes (Signed)
Pt ambulated to bathroom.  Encouraged to drink contrast.  Pt states she cannot drink b/c contrast is making her nauseated.

## 2017-09-03 NOTE — ED Notes (Signed)
Dr. Darl Householder at bedside and explained tests results plan of care to pt.

## 2017-09-03 NOTE — ED Notes (Signed)
Patient transported to [facility] 

## 2017-09-03 NOTE — ED Notes (Signed)
Patient back from [name]. Reminded of need for urine sample.

## 2017-09-06 ENCOUNTER — Inpatient Hospital Stay (HOSPITAL_COMMUNITY)
Admission: AD | Admit: 2017-09-06 | Discharge: 2017-09-07 | Disposition: A | Discharge status: Home / Self Care | Payer: Medicare Other | Attending: Obstetrics and Gynecology | Admitting: Obstetrics and Gynecology

## 2017-09-06 ENCOUNTER — Inpatient Hospital Stay (HOSPITAL_COMMUNITY): Payer: Medicare Other

## 2017-09-06 ENCOUNTER — Encounter (HOSPITAL_COMMUNITY): Payer: Self-pay

## 2017-09-06 DIAGNOSIS — R109 Unspecified abdominal pain: Secondary | ICD-10-CM

## 2017-09-06 DIAGNOSIS — Z87891 Personal history of nicotine dependence: Secondary | ICD-10-CM | POA: Insufficient documentation

## 2017-09-06 DIAGNOSIS — R112 Nausea with vomiting, unspecified: Secondary | ICD-10-CM | POA: Diagnosis not present

## 2017-09-06 DIAGNOSIS — E860 Dehydration: Secondary | ICD-10-CM | POA: Insufficient documentation

## 2017-09-06 DIAGNOSIS — F1010 Alcohol abuse, uncomplicated: Secondary | ICD-10-CM | POA: Diagnosis not present

## 2017-09-06 DIAGNOSIS — R1084 Generalized abdominal pain: Secondary | ICD-10-CM

## 2017-09-06 DIAGNOSIS — Z90721 Acquired absence of ovaries, unilateral: Secondary | ICD-10-CM | POA: Diagnosis not present

## 2017-09-06 DIAGNOSIS — Z880 Allergy status to penicillin: Secondary | ICD-10-CM | POA: Diagnosis not present

## 2017-09-06 LAB — CBC WITH DIFFERENTIAL/PLATELET
Basophils Absolute: 0 10*3/uL (ref 0.0–0.1)
Basophils Relative: 0 %
Eosinophils Absolute: 0 10*3/uL (ref 0.0–0.7)
Eosinophils Relative: 0 %
HEMATOCRIT: 35.4 % — AB (ref 36.0–46.0)
HEMOGLOBIN: 10.8 g/dL — AB (ref 12.0–15.0)
LYMPHS ABS: 2.3 10*3/uL (ref 0.7–4.0)
LYMPHS PCT: 11 %
MCH: 21.7 pg — AB (ref 26.0–34.0)
MCHC: 30.5 g/dL (ref 30.0–36.0)
MCV: 71.1 fL — AB (ref 78.0–100.0)
Monocytes Absolute: 0.1 10*3/uL (ref 0.1–1.0)
Monocytes Relative: 1 %
NEUTROS ABS: 19.5 10*3/uL — AB (ref 1.7–7.7)
NEUTROS PCT: 88 %
Platelets: 341 10*3/uL (ref 150–400)
RBC: 4.98 MIL/uL (ref 3.87–5.11)
RDW: 19.8 % — ABNORMAL HIGH (ref 11.5–15.5)
WBC: 22 10*3/uL — AB (ref 4.0–10.5)

## 2017-09-06 LAB — RAPID URINE DRUG SCREEN, HOSP PERFORMED
Amphetamines: NOT DETECTED
Barbiturates: NOT DETECTED
Benzodiazepines: NOT DETECTED
Cocaine: NOT DETECTED
OPIATES: POSITIVE — AB
Tetrahydrocannabinol: POSITIVE — AB

## 2017-09-06 LAB — COMPREHENSIVE METABOLIC PANEL
ALK PHOS: 114 U/L (ref 38–126)
ALT: 23 U/L (ref 0–44)
AST: 27 U/L (ref 15–41)
Albumin: 3.8 g/dL (ref 3.5–5.0)
Anion gap: 12 (ref 5–15)
BUN: 11 mg/dL (ref 6–20)
CO2: 23 mmol/L (ref 22–32)
CREATININE: 0.85 mg/dL (ref 0.44–1.00)
Calcium: 9.4 mg/dL (ref 8.9–10.3)
Chloride: 102 mmol/L (ref 98–111)
GFR calc Af Amer: >60 mL/min (ref >60)
GFR calc non Af Amer: >60 mL/min (ref >60)
Glucose, Bld: 113 mg/dL — ABNORMAL HIGH (ref 70–99)
Potassium: 3.9 mmol/L (ref 3.5–5.1)
Sodium: 137 mmol/L (ref 135–145)
Total Bilirubin: 0.6 mg/dL (ref 0.3–1.2)
Total Protein: 7.7 g/dL (ref 6.5–8.1)

## 2017-09-06 LAB — URINALYSIS, ROUTINE W REFLEX MICROSCOPIC
Bilirubin Urine: NEGATIVE
GLUCOSE, UA: NEGATIVE mg/dL
HGB URINE DIPSTICK: NEGATIVE
KETONES UR: 20 mg/dL — AB
Leukocytes, UA: NEGATIVE
Nitrite: NEGATIVE
Protein, ur: NEGATIVE mg/dL
Specific Gravity, Urine: 1.02 (ref 1.005–1.030)
pH: 6 (ref 5.0–8.0)

## 2017-09-06 MED ORDER — LACTATED RINGERS IV BOLUS
2000.0000 mL | Freq: Once | INTRAVENOUS | Status: AC
  Administered 2017-09-06: 2000 mL via INTRAVENOUS

## 2017-09-06 MED ORDER — FLEET ENEMA 7-19 GM/118ML RE ENEM: 1.0000 | ENEMA | Freq: Once | RECTAL | Status: DC

## 2017-09-06 MED ORDER — PROMETHAZINE HCL 25 MG PO TABS: 25.0000 mg | ORAL_TABLET | Freq: Four times a day (QID) | ORAL | 0 refills | Status: DC | PRN

## 2017-09-06 MED ORDER — BISACODYL 5 MG PO TBEC
10.0000 mg | DELAYED_RELEASE_TABLET | Freq: Once | ORAL | Status: AC
  Administered 2017-09-06: 10 mg via ORAL
  Filled 2017-09-06: qty 2

## 2017-09-06 MED ORDER — LACTATED RINGERS IV SOLN
INTRAVENOUS | Status: DC
  Administered 2017-09-06: 22:00:00 via INTRAVENOUS

## 2017-09-06 MED ORDER — OXYCODONE-ACETAMINOPHEN 5-325 MG PO TABS: 1.0000 | ORAL_TABLET | Freq: Four times a day (QID) | ORAL | 0 refills | Status: DC | PRN

## 2017-09-06 MED ORDER — HYDROXYZINE HCL 50 MG/ML IM SOLN
50.0000 mg | Freq: Four times a day (QID) | INTRAMUSCULAR | Status: DC | PRN
  Administered 2017-09-06: 50 mg via INTRAMUSCULAR
  Filled 2017-09-06 (×2): qty 1

## 2017-09-06 MED ORDER — HALOPERIDOL LACTATE 5 MG/ML IJ SOLN
2.0000 mg | Freq: Four times a day (QID) | INTRAMUSCULAR | Status: DC | PRN
  Administered 2017-09-06: 2 mg via INTRAVENOUS
  Filled 2017-09-06 (×2): qty 0.4

## 2017-09-06 MED ORDER — PROMETHAZINE HCL 25 MG/ML IJ SOLN
25.0000 mg | Freq: Once | INTRAMUSCULAR | Status: AC
  Administered 2017-09-06: 25 mg via INTRAVENOUS
  Filled 2017-09-06: qty 1

## 2017-09-06 MED ORDER — PROMETHAZINE HCL 25 MG/ML IJ SOLN
12.5000 mg | Freq: Once | INTRAMUSCULAR | Status: AC
  Administered 2017-09-06: 12.5 mg via INTRAVENOUS
  Filled 2017-09-06: qty 1

## 2017-09-06 MED ORDER — HYDROMORPHONE HCL 2 MG/ML IJ SOLN
2.0000 mg | Freq: Once | INTRAMUSCULAR | Status: AC
  Administered 2017-09-06: 2 mg via INTRAVENOUS
  Filled 2017-09-06: qty 1

## 2017-09-06 NOTE — H&P (Signed)
History    42 year old female in today with nausea vomiting and abdominal pain since 828 at approximately p.m.Marland Kitchen  She was seen in the emergency room at [facility] had a pelvic ultrasound and abdominal CT which were all normal.  At that time her urine drug screen was positive for THC and opiates and she is on Percocet for pain postop pain management she is status post nephrectomy of 816 by Dr. Rosana Hoes for dermoid cyst.  Patient states she feels like this pain is gas pains which is making her nauseated the pain is so bad.  She states she had a very small bowel movement this morning.   CSN: [id number]  Arrival date & time 09/06/17  1705   None        Chief Complaint  Patient presents with  . Abdominal Pain  . Gas  . Emesis  . Nausea    HPI      Past Medical History:  Diagnosis Date  . Alcohol abuse   . Anxiety   . Depression   . Headache(784.0)   . Neuromuscular disorder [facility])          Past Surgical History:  Procedure Laterality Date  . CESAREAN SECTION  '99 and '08   x 2  . LAPAROSCOPY Right 08/22/2017   Procedure: LAPAROSCOPY OPERATIVE removal of right ovary and right dermoid cyst;  Surgeon: Sloan Leiter, MD;  Location: [place] [facility];  Service: Gynecology;  Laterality: Right;    History reviewed. No pertinent family history.  Social History        Tobacco Use  . Smoking status: Former Smoker    Packs/day: 0.50  . Smokeless tobacco: Never Used  Substance Use Topics  . Alcohol use: No  . Drug use: Yes    Types: Other-see comments, Marijuana    Comment: Occasionally smokes marijuana            OB History    Gravida  3   Para  3   Term      Preterm      AB      Living  3     SAB      TAB      Ectopic      Multiple      Live Births              Review of Systems  Constitutional: Negative.   HENT: Negative.   Eyes: Negative.   Respiratory: Negative.   Gastrointestinal: Positive for abdominal pain,  constipation, nausea and vomiting.  Endocrine: Negative.   Genitourinary: Negative.   Musculoskeletal: Negative.   Skin: Negative.   Allergic/Immunologic: Negative.   Neurological: Negative.   Hematological: Negative.   Psychiatric/Behavioral: Negative.     Allergies  Penicillins  Home Medications    BP 126/76 (BP Location: Right Arm)   Pulse 88   Temp 98.3 F (36.8 C) (Oral)   Resp 20   LMP 08/10/2017   SpO2 100%   Physical Exam  Constitutional: She is oriented to person, place, and time. She appears well-developed and well-nourished.  HENT:  Head: Normocephalic.  Cardiovascular: Normal rate, regular rhythm and normal heart sounds.  Pulmonary/Chest: Effort normal and breath sounds normal.  Abdominal: Soft. Normal appearance and bowel sounds are normal.  Well-healed lab work scopic surgical sites  Neurological: She is alert and oriented to person, place, and time.  Skin: Skin is warm and dry.  Psychiatric: She has a normal mood and affect. Her  behavior is normal.    MAU Course  Procedures (including critical care time)       Labs Reviewed  URINALYSIS, ROUTINE W REFLEX MICROSCOPIC - Abnormal; Notable for the following components:      Result Value    APPearance HAZY (*)    Ketones, ur 20 (*)    All other components within normal limits  RAPID URINE DRUG SCREEN, HOSP PERFORMED - Abnormal; Notable for the following components:   Opiates POSITIVE (*)    Tetrahydrocannabinol POSITIVE (*)    All other components within normal limits  CBC WITH DIFFERENTIAL/PLATELET  COMPREHENSIVE METABOLIC PANEL   [id number])  No results found.     1. Nausea and vomiting, intractability of vomiting not specified, unspecified vomiting type   2. Generalized abdominal pain   3. Dehydration       MDM  VSS, abdomen soft bowel sounds are present but sluggish.  Well-healing laparoscopic incisions.  I had a lengthy discussion with the  patient in regards to her positive drug screen for THC.  We discussed the possibility of canniboid hyperemesis.  The patient informed me that she had been smoking pot for a long time and has never had a problem she said the problem started when she tried to take some pills for gas and then the nausea and the retching started and the gas pains and has not resolved since.  She admits to one small bowel movement this morning but has been multiple days since she is had a bowel movement.  Urine positive for ketones.  Will IV hydrate and give IV antiemetics. 19:26 pt states feeling no better. Labs results discussed with Dr. Glo Herring. Elevated WBC. Acute abd series per Dr. Glo Herring.  POC discussed with Dr. Glo Herring pt to be admitted.

## 2017-09-06 NOTE — MAU Provider Note (Addendum)
History    42 year old female in today with nausea vomiting and abdominal pain since 828 at approximately p.m.Marland Kitchen  She was seen in the emergency room at [facility] had a pelvic ultrasound and abdominal CT which were all normal.  At that time her urine drug screen was positive for THC and opiates and she is on Percocet for pain postop pain management she is status post oophorectomy of 816 by Dr. Rosana Hoes for dermoid cyst.  Patient states she feels like this pain is gas pains which is making her nauseated the pain is so bad.  She states she had a very small bowel movement this morning.   Jonnie Kind, MD In further discussions with the patient , the pain she has now is somewhat similar to the pains she had PRECEEDING the laparoscopic oophorectomy, and it is unclear if she is bettered by the surgery, and review of recent ED visit s would suggest there is not significant change in pt condition with the surgery, and there's no incerease in her symptoms since the surgery, but more a continuation of sx. CSN: [id number]  Arrival date & time 09/06/17  1705   None     Chief Complaint  Patient presents with  . Abdominal Pain  . Gas  . Emesis  . Nausea    HPI  Past Medical History:  Diagnosis Date  . Alcohol abuse   . Anxiety   . Depression   . Headache(784.0)   . Neuromuscular disorder ([place])   uses one Blunt a day.  Past Surgical History:  Procedure Laterality Date  . CESAREAN SECTION  '99 and '08   x 2  . LAPAROSCOPY Right 08/22/2017   Procedure: LAPAROSCOPY OPERATIVE removal of right ovary and right dermoid cyst;  Surgeon: Sloan Leiter, MD;  Location: [facility];  Service: Gynecology;  Laterality: Right;    History reviewed. No pertinent family history.  Social History   Tobacco Use  . Smoking status: Former Smoker    Packs/day: 0.50  . Smokeless tobacco: Never Used  Substance Use Topics  . Alcohol use: No  . Drug use: Yes    Types: Other-see comments, Marijuana    Comment:  Occasionally smokes marijuana    OB History    Gravida  3   Para  3   Term      Preterm      AB      Living  3     SAB      TAB      Ectopic      Multiple      Live Births              Review of Systems  Constitutional: Negative.   HENT: Negative.   Eyes: Negative.   Respiratory: Negative.   Gastrointestinal: Positive for abdominal pain, constipation, nausea and vomiting.  Endocrine: Negative.   Genitourinary: Negative.   Musculoskeletal: Negative.   Skin: Negative.   Allergic/Immunologic: Negative.   Neurological: Negative.   Hematological: Negative.   Psychiatric/Behavioral: Negative.   agitated had difficult staying still during initial w/u, relaxed completely and tolerated repeat abd exam without pain after analgesic and antiemetic. This is the same she has been for some time   Allergies  Penicillins  Home Medications    BP 126/76 (BP Location: Right Arm)   Pulse 88   Temp 98.3 F (36.8 C) (Oral)   Resp 20   LMP 08/10/2017   SpO2 100%   Physical  Exam  Constitutional: She is oriented to person, place, and time. She appears well-developed and well-nourished.  HENT:  Head: Normocephalic.  Cardiovascular: Normal rate, regular rhythm and normal heart sounds.  Pulmonary/Chest: Effort normal and breath sounds normal.  Abdominal: Soft. Normal appearance and bowel sounds are normal.  Well-healed lab work scopic surgical sites  Neurological: She is alert and oriented to person, place, and time.  Skin: Skin is warm and dry.  Psychiatric: She has a normal mood and affect. Her behavior is normal.    MAU Course  Procedures (including critical care time)  Labs Reviewed  URINALYSIS, ROUTINE W REFLEX MICROSCOPIC - Abnormal; Notable for the following components:      Result Value   APPearance HAZY (*)    Ketones, ur 20 (*)    All other components within normal limits  RAPID URINE DRUG SCREEN, HOSP PERFORMED - Abnormal; Notable for the following  components:   Opiates POSITIVE (*)    Tetrahydrocannabinol POSITIVE (*)    All other components within normal limits  CBC WITH DIFFERENTIAL/PLATELET  COMPREHENSIVE METABOLIC PANEL   No results found.   1. Nausea and vomiting, intractability of vomiting not specified, unspecified vomiting type   2. Generalized abdominal pain   3. Dehydration       MDM  VSS, abdomen soft bowel sounds are present but sluggish.  Well-healing laparoscopic incisions.  I had a lengthy discussion with the patient in regards to her positive drug screen for THC.  We discussed the possibility of canniboid hyperemesis.  The patient informed me that she had been smoking pot for a long time and has never had a problem she said the problem started when she tried to take some pills for gas and then the nausea and the retching started and the gas pains and has not resolved since.  She admits to one small bowel movement this morning but has been multiple days since she is had a bowel movement.  Urine positive for ketones.  Will IV hydrate and give IV antiemetics. 19:26 pt states feeling no better. Labs results discussed with Dr. Glo Herring. Elevated WBC. Acute abd series which is normal per Dr. Glo Herring.  POC discussed with Dr. Glo Herring pt to get u/s gall bladder GB u/s totally normal. Only abnormal finding is wbc, and pt specifically denies fever.  Plan; d/c on phenergan with a few vicodin. Repeat cbc + esr in 36 hours, Monday morning .

## 2017-09-06 NOTE — MAU Note (Signed)
Had cyst on ovary removed 2 weeks ago and since then shes had really bad gas, has tried zofran for nausea and something for gas but it isnt helping. The gas pain makes her nauseated, has passed a little bit of gas. Last BM this AM but was small

## 2017-09-07 DIAGNOSIS — R112 Nausea with vomiting, unspecified: Secondary | ICD-10-CM | POA: Diagnosis not present

## 2017-09-07 NOTE — Discharge Instructions (Signed)

## 2017-09-08 ENCOUNTER — Inpatient Hospital Stay (HOSPITAL_COMMUNITY)
Admission: AD | Admit: 2017-09-08 | Discharge: 2017-09-08 | Disposition: A | Discharge status: Home / Self Care | Payer: Medicare Other | Source: Ambulatory Visit | Attending: Obstetrics and Gynecology | Admitting: Obstetrics and Gynecology

## 2017-09-08 ENCOUNTER — Inpatient Hospital Stay (EMERGENCY_DEPARTMENT_HOSPITAL)
Admission: AD | Admit: 2017-09-08 | Discharge: 2017-09-09 | Disposition: A | Discharge status: Home / Self Care | Payer: Medicare Other | Source: Ambulatory Visit | Attending: Emergency Medicine | Admitting: Emergency Medicine

## 2017-09-08 ENCOUNTER — Encounter (HOSPITAL_COMMUNITY): Payer: Self-pay

## 2017-09-08 DIAGNOSIS — E876 Hypokalemia: Secondary | ICD-10-CM

## 2017-09-08 DIAGNOSIS — R102 Pelvic and perineal pain: Secondary | ICD-10-CM | POA: Diagnosis not present

## 2017-09-08 DIAGNOSIS — R1115 Cyclical vomiting syndrome unrelated to migraine: Secondary | ICD-10-CM

## 2017-09-08 DIAGNOSIS — R109 Unspecified abdominal pain: Secondary | ICD-10-CM | POA: Diagnosis present

## 2017-09-08 DIAGNOSIS — G43A0 Cyclical vomiting, not intractable: Secondary | ICD-10-CM | POA: Diagnosis not present

## 2017-09-08 DIAGNOSIS — G43A1 Cyclical vomiting, intractable: Secondary | ICD-10-CM | POA: Diagnosis not present

## 2017-09-08 DIAGNOSIS — F1290 Cannabis use, unspecified, uncomplicated: Secondary | ICD-10-CM | POA: Diagnosis not present

## 2017-09-08 DIAGNOSIS — R1010 Upper abdominal pain, unspecified: Secondary | ICD-10-CM

## 2017-09-08 DIAGNOSIS — R112 Nausea with vomiting, unspecified: Secondary | ICD-10-CM | POA: Diagnosis not present

## 2017-09-08 LAB — CBC WITH DIFFERENTIAL/PLATELET
BASOS ABS: 0 10*3/uL (ref 0.0–0.1)
BASOS PCT: 0 %
Basophils Absolute: 0.1 10*3/uL (ref 0.0–0.1)
Basophils Relative: 0 %
EOS ABS: 0.3 10*3/uL (ref 0.0–0.7)
Eosinophils Absolute: 0.3 10*3/uL (ref 0.0–0.7)
Eosinophils Relative: 2 %
Eosinophils Relative: 2 %
HCT: 34.3 % — ABNORMAL LOW (ref 36.0–46.0)
HEMATOCRIT: 34.8 % — AB (ref 36.0–46.0)
HEMOGLOBIN: 10.6 g/dL — AB (ref 12.0–15.0)
Hemoglobin: 10.4 g/dL — ABNORMAL LOW (ref 12.0–15.0)
LYMPHS PCT: 30 %
Lymphocytes Relative: 24 %
Lymphs Abs: 4.4 10*3/uL — ABNORMAL HIGH (ref 0.7–4.0)
Lymphs Abs: 4.6 10*3/uL — ABNORMAL HIGH (ref 0.7–4.0)
MCH: 21.7 pg — ABNORMAL LOW (ref 26.0–34.0)
MCH: 21.8 pg — ABNORMAL LOW (ref 26.0–34.0)
MCHC: 30.3 g/dL (ref 30.0–36.0)
MCHC: 30.5 g/dL (ref 30.0–36.0)
MCV: 71.3 fL — ABNORMAL LOW (ref 78.0–100.0)
MCV: 72.1 fL — AB (ref 78.0–100.0)
MONOS PCT: 2 %
MONOS PCT: 3 %
Monocytes Absolute: 0.4 10*3/uL (ref 0.1–1.0)
Monocytes Absolute: 0.5 10*3/uL (ref 0.1–1.0)
NEUTROS ABS: 13.2 10*3/uL — AB (ref 1.7–7.7)
NEUTROS PCT: 65 %
NEUTROS PCT: 72 %
Neutro Abs: 9.8 10*3/uL — ABNORMAL HIGH (ref 1.7–7.7)
OTHER: 0 %
PLATELETS: 330 10*3/uL (ref 150–400)
Platelets: 350 10*3/uL (ref 150–400)
RBC: 4.76 MIL/uL (ref 3.87–5.11)
RBC: 4.88 MIL/uL (ref 3.87–5.11)
RDW: 19.8 % — ABNORMAL HIGH (ref 11.5–15.5)
RDW: 19.9 % — ABNORMAL HIGH (ref 11.5–15.5)
SMEAR REVIEW: ADEQUATE
WBC: 15.2 10*3/uL — ABNORMAL HIGH (ref 4.0–10.5)
WBC: 18.3 10*3/uL — AB (ref 4.0–10.5)

## 2017-09-08 LAB — SEDIMENTATION RATE: Sed Rate: 37 mm/hr — ABNORMAL HIGH (ref 0–22)

## 2017-09-08 MED ORDER — HYDROMORPHONE HCL 1 MG/ML IJ SOLN
1.0000 mg | Freq: Once | INTRAMUSCULAR | Status: AC
  Administered 2017-09-08: 1 mg via INTRAVENOUS
  Filled 2017-09-08: qty 1

## 2017-09-08 MED ORDER — LACTATED RINGERS IV BOLUS
1000.0000 mL | Freq: Once | INTRAVENOUS | Status: AC
  Administered 2017-09-08: 1000 mL via INTRAVENOUS

## 2017-09-08 MED ORDER — PROMETHAZINE HCL 25 MG/ML IJ SOLN
25.0000 mg | Freq: Once | INTRAMUSCULAR | Status: AC
  Administered 2017-09-08: 25 mg via INTRAVENOUS
  Filled 2017-09-08: qty 1

## 2017-09-08 NOTE — MAU Note (Signed)
Pt had surgery 8/16. Had some gas since, but unsure if it's from the surgery. No pain. No fevers, no bleeding.

## 2017-09-08 NOTE — MAU Provider Note (Addendum)
History     CSN: [id number]  Arrival date and time: 09/08/17 2311   First Provider Initiated Contact with Patient 09/08/17 2332      Chief Complaint  Patient presents with  . Abdominal Pain  . Emesis   HPI Haley Freeman is a 42 y.o. G3P3 non pregnant female who presents with abdominal pain and vomiting that started at 2200. She states it started after she ate fried chicken and she tried oxycodone and nausea medicine with no relief. She rates the pain a 10/10. She has been seen multiple times for the same pain and vomiting. Labs this am were improved and patient was instructed to follow up with her PCP.   OB History    Gravida  3   Para  3   Term      Preterm      AB      Living  3     SAB      TAB      Ectopic      Multiple      Live Births              Past Medical History:  Diagnosis Date  . Alcohol abuse   . Anxiety   . Depression   . Headache(784.0)   . Neuromuscular disorder [facility])     Past Surgical History:  Procedure Laterality Date  . CESAREAN SECTION  '99 and '08   x 2  . LAPAROSCOPY Right 08/22/2017   Procedure: LAPAROSCOPY OPERATIVE removal of right ovary and right dermoid cyst;  Surgeon: Sloan Leiter, MD;  Location: [facility];  Service: Gynecology;  Laterality: Right;    No family history on file.  Social History   Tobacco Use  . Smoking status: Former Smoker    Packs/day: 0.50  . Smokeless tobacco: Never Used  Substance Use Topics  . Alcohol use: No  . Drug use: Yes    Types: Other-see comments, Marijuana    Comment: Occasionally smokes marijuana    Allergies:  Allergies  Allergen Reactions  . Strawberry (Diagnostic) Shortness Of Breath  . Penicillins     Swells throat    Medications Prior to Admission  Medication Sig Dispense Refill Last Dose  . ibuprofen (ADVIL,MOTRIN) 600 MG tablet Take 600 mg by mouth every 6 (six) hours as needed for mild pain.    Past Week at Unknown time  . oxyCODONE-acetaminophen  (PERCOCET/ROXICET) 5-325 MG tablet Take 1-2 tablets by mouth every 6 (six) hours as needed. 20 tablet 0 09/08/2017 at 2130  . pantoprazole (PROTONIX) 40 MG tablet Take 1 tablet (40 mg total) by mouth daily. 20 tablet 0 09/08/2017 at Unknown time  . promethazine (PHENERGAN) 25 MG tablet Take 1 tablet (25 mg total) by mouth every 6 (six) hours as needed for nausea or vomiting (nausea). 12 tablet 0 Past Week at Unknown time  . traZODone (DESYREL) 100 MG tablet Take 100 mg by mouth at bedtime.   09/07/2017 at Unknown time    Review of Systems  Constitutional: Negative.  Negative for fatigue and fever.  HENT: Negative.   Respiratory: Negative.  Negative for shortness of breath.   Cardiovascular: Negative.  Negative for chest pain.  Gastrointestinal: Positive for abdominal pain, nausea and vomiting. Negative for constipation and diarrhea.  Genitourinary: Negative.  Negative for dysuria.  Neurological: Negative.  Negative for dizziness and headaches.   Physical Exam   Blood pressure (!) 128/104, pulse 82, temperature 98.9 F (37.2 C), temperature  source Oral, resp. rate 20, last menstrual period 08/10/2017, SpO2 100 %.  Physical Exam  Nursing note and vitals reviewed. Constitutional: She is oriented to person, place, and time. She appears well-developed and well-nourished. No distress.  HENT:  Head: Normocephalic.  Eyes: Pupils are equal, round, and reactive to light.  Cardiovascular: Normal rate, regular rhythm and normal heart sounds.  Respiratory: Effort normal and breath sounds normal. No respiratory distress.  GI: Soft. Bowel sounds are normal. She exhibits no distension. There is no tenderness. There is no rebound and no guarding.  Neurological: She is alert and oriented to person, place, and time.  Skin: Skin is warm and dry.  Psychiatric: She has a normal mood and affect. Her behavior is normal. Judgment and thought content normal.   MAU Course  Procedures Results for orders placed or  performed during the hospital encounter of 09/08/17 (from the past 24 hour(s))  CBC with Differential/Platelet     Status: Abnormal   Collection Time: 09/08/17 11:49 PM  Result Value Ref Range   WBC 18.3 (H) 4.0 - 10.5 K/uL   RBC 4.88 3.87 - 5.11 MIL/uL   Hemoglobin 10.6 (L) 12.0 - 15.0 g/dL   HCT 34.8 (L) 36.0 - 46.0 %   MCV 71.3 (L) 78.0 - 100.0 fL   MCH 21.7 (L) 26.0 - 34.0 pg   MCHC 30.5 30.0 - 36.0 g/dL   RDW 19.8 (H) 11.5 - 15.5 %   Platelets 350 150 - 400 K/uL   Neutrophils Relative % 72 %   Neutro Abs 13.2 (H) 1.7 - 7.7 K/uL   Lymphocytes Relative 24 %   Lymphs Abs 4.4 (H) 0.7 - 4.0 K/uL   Monocytes Relative 2 %   Monocytes Absolute 0.4 0.1 - 1.0 K/uL   Eosinophils Relative 2 %   Eosinophils Absolute 0.3 0.0 - 0.7 K/uL   Basophils Relative 0 %   Basophils Absolute 0.1 0.0 - 0.1 K/uL   MDM Consulted with Dr. Glo Herring upon patient's arrival- exam and presentation unchanged from previous visit. Will give IV fluids with pain and nausea medication. Recheck CBC and if stable, patient to follow up with PCP as instructed for possible GI cause of pain and vomiting.  UA, UDS- patient urinated in bed multiple times  CBC with Diff- stable from this am LR Phenergan IV Dilaudid IV  Patient still complaining of nausea Zofran 4mg  IV Pepcid 20mg  IV  Patient still reporting 10/10 pain and nausea. Consulted with Dr. Glo Herring- will transfer patient to [facility] ED for further management.   Dr. Venora Maples accepted patient transfer.   Assessment and Plan   1. Intractable cyclical vomiting with nausea   2. Pain of upper abdomen    -Transfer to [facility] ED. Patient agreeable to plan of care.  Wende Mott CNM 09/08/2017, 11:32 PM

## 2017-09-08 NOTE — MAU Note (Signed)
Pt lab results reviewed with Dr. Glo Herring. Pt will follow up with PCP and has appointment with GYN. Pt verbalized understanding.

## 2017-09-08 NOTE — MAU Note (Signed)
Pt here with c/o epigastric pain and vomiting. Had GYN surgery 8/16 to remove a cyst and right ovary. Started having these symptoms a few days after.

## 2017-09-08 NOTE — MAU Note (Signed)
Urine sent to lab 

## 2017-09-09 DIAGNOSIS — G43A1 Cyclical vomiting, intractable: Secondary | ICD-10-CM | POA: Diagnosis not present

## 2017-09-09 DIAGNOSIS — R112 Nausea with vomiting, unspecified: Secondary | ICD-10-CM | POA: Diagnosis present

## 2017-09-09 DIAGNOSIS — R109 Unspecified abdominal pain: Secondary | ICD-10-CM | POA: Diagnosis present

## 2017-09-09 DIAGNOSIS — E876 Hypokalemia: Secondary | ICD-10-CM | POA: Diagnosis not present

## 2017-09-09 DIAGNOSIS — F1290 Cannabis use, unspecified, uncomplicated: Secondary | ICD-10-CM | POA: Diagnosis not present

## 2017-09-09 LAB — URINALYSIS, ROUTINE W REFLEX MICROSCOPIC
BILIRUBIN URINE: NEGATIVE
GLUCOSE, UA: NEGATIVE mg/dL
HGB URINE DIPSTICK: NEGATIVE
Ketones, ur: 5 mg/dL — AB
Leukocytes, UA: NEGATIVE
Nitrite: NEGATIVE
Protein, ur: NEGATIVE mg/dL
SPECIFIC GRAVITY, URINE: 1.011 (ref 1.005–1.030)
pH: 9 — ABNORMAL HIGH (ref 5.0–8.0)

## 2017-09-09 LAB — COMPREHENSIVE METABOLIC PANEL
ALBUMIN: 3.7 g/dL (ref 3.5–5.0)
ALT: 16 U/L (ref 0–44)
AST: 21 U/L (ref 15–41)
Alkaline Phosphatase: 101 U/L (ref 38–126)
Anion gap: 11 (ref 5–15)
BILIRUBIN TOTAL: 0.3 mg/dL (ref 0.3–1.2)
BUN: 9 mg/dL (ref 6–20)
CO2: 26 mmol/L (ref 22–32)
CREATININE: 0.9 mg/dL (ref 0.44–1.00)
Calcium: 8.9 mg/dL (ref 8.9–10.3)
Chloride: 103 mmol/L (ref 98–111)
GFR calc Af Amer: >60 mL/min (ref >60)
GLUCOSE: 123 mg/dL — AB (ref 70–99)
Potassium: 3 mmol/L — ABNORMAL LOW (ref 3.5–5.1)
SODIUM: 140 mmol/L (ref 135–145)
Total Protein: 7.6 g/dL (ref 6.5–8.1)

## 2017-09-09 LAB — RAPID URINE DRUG SCREEN, HOSP PERFORMED
AMPHETAMINES: NOT DETECTED
Barbiturates: NOT DETECTED
Benzodiazepines: NOT DETECTED
Cocaine: NOT DETECTED
Opiates: NOT DETECTED
TETRAHYDROCANNABINOL: POSITIVE — AB

## 2017-09-09 LAB — LIPASE, BLOOD: LIPASE: 27 U/L (ref 11–51)

## 2017-09-09 MED ORDER — ONDANSETRON HCL 4 MG/2ML IJ SOLN
4.0000 mg | Freq: Once | INTRAMUSCULAR | Status: AC
  Administered 2017-09-09: 4 mg via INTRAVENOUS
  Filled 2017-09-09: qty 2

## 2017-09-09 MED ORDER — RANITIDINE HCL 300 MG PO TABS: 300.0000 mg | ORAL_TABLET | Freq: Every day | ORAL | 0 refills | Status: DC

## 2017-09-09 MED ORDER — DIPHENHYDRAMINE HCL 50 MG/ML IJ SOLN
25.0000 mg | Freq: Once | INTRAMUSCULAR | Status: AC
  Administered 2017-09-09: 25 mg via INTRAVENOUS
  Filled 2017-09-09: qty 1

## 2017-09-09 MED ORDER — POTASSIUM CHLORIDE CRYS ER 20 MEQ PO TBCR: 40.0000 meq | EXTENDED_RELEASE_TABLET | Freq: Two times a day (BID) | ORAL | 0 refills | Status: DC

## 2017-09-09 MED ORDER — PROMETHAZINE HCL 25 MG PO TABS: 25.0000 mg | ORAL_TABLET | Freq: Four times a day (QID) | ORAL | 0 refills | Status: DC | PRN

## 2017-09-09 MED ORDER — PROCHLORPERAZINE EDISYLATE 10 MG/2ML IJ SOLN
10.0000 mg | Freq: Once | INTRAMUSCULAR | Status: AC
  Administered 2017-09-09: 10 mg via INTRAVENOUS
  Filled 2017-09-09: qty 2

## 2017-09-09 MED ORDER — FAMOTIDINE IN NACL 20-0.9 MG/50ML-% IV SOLN
20.0000 mg | Freq: Once | INTRAVENOUS | Status: AC
  Administered 2017-09-09: 20 mg via INTRAVENOUS
  Filled 2017-09-09: qty 50

## 2017-09-09 MED ORDER — HALOPERIDOL LACTATE 5 MG/ML IJ SOLN: 1.0000 mg | Freq: Once | INTRAMUSCULAR | Status: DC

## 2017-09-09 MED ORDER — HALOPERIDOL LACTATE 5 MG/ML IJ SOLN
2.0000 mg | Freq: Once | INTRAMUSCULAR | Status: AC
  Administered 2017-09-09: 2 mg via INTRAVENOUS
  Filled 2017-09-09: qty 1

## 2017-09-09 MED ORDER — LORAZEPAM 2 MG/ML IJ SOLN: 1.0000 mg | Freq: Once | INTRAMUSCULAR | Status: DC

## 2017-09-09 MED ORDER — OMEPRAZOLE 20 MG PO CPDR: 40.0000 mg | DELAYED_RELEASE_CAPSULE | Freq: Every day | ORAL | 0 refills | Status: DC

## 2017-09-09 MED ORDER — CAPSAICIN 0.025 % EX CREA
TOPICAL_CREAM | Freq: Two times a day (BID) | CUTANEOUS | Status: DC
  Administered 2017-09-09: 05:00:00 via TOPICAL
  Filled 2017-09-09: qty 60

## 2017-09-09 NOTE — ED Provider Notes (Signed)
[place] DEPT Provider Note   CSN: [id number] Arrival date & time: 09/08/17  2311     History   Chief Complaint Chief Complaint  Patient presents with  . Abdominal Pain  . Emesis    HPI Haley Freeman is a 42 y.o. female arrives from MAU by [facility] for evaluation of intractable nausea and vomiting.  Symptoms refractory to tx at MAU, pt transferred to ER for further management.  Nausea, vomiting and abdominal pain began 2 weeks ago and have been gradually worsening.  Symptoms acutely worsened at 9 pm last night.  Endorses more than 10 episodes of emesis, initially with bloody streaks but not anymore.  Abdominal pain is in epigastric region, constant, non radiating worse with palpation.  Has taken zofran, oxycontin and gas medicine at home without relief.  Previous h/o ETOH abuse but states now rare, had margarita at 1 pm yesterday before symptoms began.  Marijuana use every other day, last time yesterday.  H/o ovarian torsion s/p surgery and two cesarean sections. Other associated symptoms include sweats during active vomiting.   No fevers, CP or shortness of breath with exertion, diarrhea, constipation, blood in stool  HPI  Past Medical History:  Diagnosis Date  . Alcohol abuse   . Anxiety   . Depression   . Headache(784.0)   . Neuromuscular disorder [facility])     Patient Active Problem List   Diagnosis Date Noted  . Nausea and vomiting 09/06/2017  . Dermoid 08/22/2017    Past Surgical History:  Procedure Laterality Date  . CESAREAN SECTION  '99 and '08   x 2  . LAPAROSCOPY Right 08/22/2017   Procedure: LAPAROSCOPY OPERATIVE removal of right ovary and right dermoid cyst;  Surgeon: Sloan Leiter, MD;  Location: [facility];  Service: Gynecology;  Laterality: Right;     OB History    Gravida  3   Para  3   Term      Preterm      AB      Living  3     SAB      TAB      Ectopic      Multiple      Live Births               Home Medications    Prior to Admission medications   Medication Sig Start Date End Date Taking? Authorizing Provider  ibuprofen (ADVIL,MOTRIN) 600 MG tablet Take 600 mg by mouth every 6 (six) hours as needed for mild pain.    Yes [provider]  oxyCODONE-acetaminophen (PERCOCET/ROXICET) 5-325 MG tablet Take 1-2 tablets by mouth every 6 (six) hours as needed. 09/06/17  Yes Jonnie Kind, MD  pantoprazole (PROTONIX) 40 MG tablet Take 1 tablet (40 mg total) by mouth daily. 09/03/17  Yes Lajean Saver, MD  promethazine (PHENERGAN) 25 MG tablet Take 1 tablet (25 mg total) by mouth every 6 (six) hours as needed for nausea or vomiting (nausea). 09/06/17  Yes Jonnie Kind, MD  traZODone (DESYREL) 100 MG tablet Take 100 mg by mouth at bedtime.   Yes [provider]  omeprazole (PRILOSEC) 20 MG capsule Take 2 capsules (40 mg total) by mouth daily. 09/09/17 10/09/17  Kinnie Feil, PA-C  potassium chloride SA (K-DUR,KLOR-CON) 20 MEQ tablet Take 2 tablets (40 mEq total) by mouth 2 (two) times daily for 7 days. 09/09/17 09/16/17  Kinnie Feil, PA-C  promethazine (PHENERGAN) 25 MG tablet Take 1  tablet (25 mg total) by mouth every 6 (six) hours as needed for nausea or vomiting. 09/09/17   Kinnie Feil, PA-C  ranitidine (ZANTAC) 300 MG tablet Take 1 tablet (300 mg total) by mouth at bedtime. 09/09/17 10/09/17  Kinnie Feil, PA-C    Family History No family history on file.  Social History Social History   Tobacco Use  . Smoking status: Former Smoker    Packs/day: 0.50  . Smokeless tobacco: Never Used  Substance Use Topics  . Alcohol use: No  . Drug use: Yes    Types: Other-see comments, Marijuana    Comment: Occasionally smokes marijuana     Allergies   Strawberry (diagnostic) and Penicillins   Review of Systems Review of Systems  Gastrointestinal: Positive for abdominal pain, nausea and vomiting.  All other systems reviewed and are  negative.  Physical Exam Updated Vital Signs BP (!) 159/93   Pulse 90   Temp 98.9 F (37.2 C) (Oral)   Resp 16   LMP 08/10/2017   SpO2 98%   Physical Exam  Constitutional: She is oriented to person, place, and time. She appears well-developed and well-nourished.  Looks uncomfortable but non toxic.  Belching and dry heaving during exam intermittently.   HENT:  Head: Normocephalic and atraumatic.  Nose: Nose normal.  MMM  Eyes: Pupils are equal, round, and reactive to light. Conjunctivae and EOM are normal.  Neck: Normal range of motion.  Cardiovascular: Normal rate and regular rhythm.  Pulmonary/Chest: Effort normal and breath sounds normal.  Abdominal: Soft. Bowel sounds are normal. There is tenderness in the epigastric area.  No G/R/R. No suprapubic or CVA tenderness. Negative Murphy's and McBurney's.    Musculoskeletal: Normal range of motion.  Neurological: She is alert and oriented to person, place, and time.  Skin: Skin is warm and dry. Capillary refill takes less than 2 seconds.  Psychiatric: She has a normal mood and affect. Her behavior is normal.  Nursing note and vitals reviewed.    ED Treatments / Results  Labs (all labs ordered are listed, but only abnormal results are displayed) Labs Reviewed  CBC WITH DIFFERENTIAL/PLATELET - Abnormal; Notable for the following components:      Result Value   WBC 18.3 (*)    Hemoglobin 10.6 (*)    HCT 34.8 (*)    MCV 71.3 (*)    MCH 21.7 (*)    RDW 19.8 (*)    Neutro Abs 13.2 (*)    Lymphs Abs 4.4 (*)    All other components within normal limits  URINALYSIS, ROUTINE W REFLEX MICROSCOPIC - Abnormal; Notable for the following components:   Color, Urine STRAW (*)    pH 9.0 (*)    Ketones, ur 5 (*)    All other components within normal limits  RAPID URINE DRUG SCREEN, HOSP PERFORMED - Abnormal; Notable for the following components:   Tetrahydrocannabinol POSITIVE (*)    All other components within normal limits   COMPREHENSIVE METABOLIC PANEL - Abnormal; Notable for the following components:   Potassium 3.0 (*)    Glucose, Bld 123 (*)    All other components within normal limits  LIPASE, BLOOD    EKG None  Radiology No results found.  Procedures Procedures (including critical care time)  Medications Ordered in ED Medications  capsaicin (ZOSTRIX) 0.025 % cream ( Topical Provided for home use 09/09/17 0441)  lactated ringers bolus 1,000 mL (0 mLs Intravenous Stopped 09/09/17 0441)  HYDROmorphone (DILAUDID) injection 1 mg (  1 mg Intravenous Given 09/08/17 2352)  promethazine (PHENERGAN) injection 25 mg (25 mg Intravenous Given 09/08/17 2355)  ondansetron (ZOFRAN) injection 4 mg (4 mg Intravenous Given 09/09/17 0059)  famotidine (PEPCID) IVPB 20 mg premix (0 mg Intravenous Stopped 09/09/17 0241)  haloperidol lactate (HALDOL) injection 2 mg (2 mg Intravenous Given 09/09/17 0440)  prochlorperazine (COMPAZINE) injection 10 mg (10 mg Intravenous Given 09/09/17 0441)  diphenhydrAMINE (BENADRYL) injection 25 mg (25 mg Intravenous Given 09/09/17 0440)     Initial Impression / Assessment and Plan / ED Course  I have reviewed the triage vital signs and the nursing notes.  Pertinent labs & imaging results that were available during my care of the patient were reviewed by me and considered in my medical decision making (see chart for details).  Clinical Course as of Sep 10 602  Tue Sep 09, 2017  0513 Reevaluated patient after medications.  States she feels better, no emesis or dry heaving since medication.  Discussed plan to follow-up on labs and discharge, she is in agreement.   [CG]    Clinical Course User Index [CG] Kinnie Feil, PA-C    Work up at [facility] reviewed. She had CBC and received LR, phenergan IV, dilaudid IV, zofran IV, pepcid IV with no symptom relief.  On exam pt is dry heaving without significant amount of emesis noted.  Mild epigastric tenderness without peritonitis.  Looks uncomfortable but  non toxic.    0600: WBC 18 from 15 at MAU. K 3.0.  Pt symptoms significantly improved in ER with IV med. No emesis or dry heaving after med administration.  abd exam improved. Positive THC in urine.  Discussed with pt cause of symptoms is unclear however possibility of cannabinoid hyperemesis syndrome is a possibility, vs gastritis.  Recommended she stop marijuana and avoid alcohol, NSAID. Will dc with phenergan, K supplements and GI f/u. Discussed return preacutions. Pt in agreement.   Final Clinical Impressions(s) / ED Diagnoses   Final diagnoses:  Intractable cyclical vomiting with nausea  Pain of upper abdomen  Hypokalemia    ED Discharge Orders         Ordered    omeprazole (PRILOSEC) 20 MG capsule  Daily     09/09/17 0556    ranitidine (ZANTAC) 300 MG tablet  Daily at bedtime     09/09/17 0556    promethazine (PHENERGAN) 25 MG tablet  Every 6 hours PRN     09/09/17 0556    potassium chloride SA (K-DUR,KLOR-CON) 20 MEQ tablet  2 times daily     09/09/17 0602           Kinnie Feil, PA-C 09/09/17 0175    Jola Schmidt, MD 09/09/17 [phone]

## 2017-09-09 NOTE — Discharge Instructions (Addendum)
You were seen in the ER for nausea, vomiting, abdominal pain.   Your potassium is slightly low which happens with a lot of vomiting, otherwise lab work is normal.   Your symptoms improved.   Cause of your symptoms is unclear however frequent marijuana use can lead to marijuana induced nausea and vomiting (cannabinoid hyperemesis syndrome). Try to cut back and avoid marijuana and alcohol.  Take omeprazole and ranitidine as prescribed. Stay well hydrated. Phenergan every 6 hours for nausea. Take potassium supplements as prescribed.  Return for vomiting despite nausea medicine, fevers, chills, worsening abdominal pain

## 2017-09-09 NOTE — ED Notes (Signed)
Bed: MB84 Expected date:  Expected time:  Means of arrival:  Comments: MAU

## 2017-09-09 NOTE — ED Notes (Signed)
EDP notified of patient's arrival. Patient arrived via [facility] from MAU with complaints of intractable nausea and epigastric abdominal pain. Patient states she has had nausea for weeks and the pain started yesterday. Per [facility], family was worried that patient was self-inducing her vomiting. Patient ambulatory to restroom on arrival and complaining of pain.

## 2017-09-13 ENCOUNTER — Other Ambulatory Visit: Payer: Self-pay

## 2017-09-13 ENCOUNTER — Emergency Department (HOSPITAL_COMMUNITY)
Admission: EM | Admit: 2017-09-13 | Discharge: 2017-09-13 | Disposition: A | Discharge status: Home / Self Care | Payer: Medicare Other | Attending: Emergency Medicine | Admitting: Emergency Medicine

## 2017-09-13 ENCOUNTER — Encounter (HOSPITAL_COMMUNITY): Payer: Self-pay | Admitting: Emergency Medicine

## 2017-09-13 DIAGNOSIS — R1013 Epigastric pain: Secondary | ICD-10-CM

## 2017-09-13 DIAGNOSIS — R112 Nausea with vomiting, unspecified: Secondary | ICD-10-CM

## 2017-09-13 DIAGNOSIS — Z87891 Personal history of nicotine dependence: Secondary | ICD-10-CM | POA: Diagnosis not present

## 2017-09-13 DIAGNOSIS — Z79899 Other long term (current) drug therapy: Secondary | ICD-10-CM | POA: Insufficient documentation

## 2017-09-13 LAB — URINALYSIS, ROUTINE W REFLEX MICROSCOPIC
BACTERIA UA: NONE SEEN
BILIRUBIN URINE: NEGATIVE
Glucose, UA: NEGATIVE mg/dL
KETONES UR: NEGATIVE mg/dL
Leukocytes, UA: NEGATIVE
NITRITE: NEGATIVE
Protein, ur: 30 mg/dL — AB
SPECIFIC GRAVITY, URINE: 1.024 (ref 1.005–1.030)
pH: 6 (ref 5.0–8.0)

## 2017-09-13 LAB — CBC
HEMATOCRIT: 36.7 % (ref 36.0–46.0)
HEMOGLOBIN: 10.6 g/dL — AB (ref 12.0–15.0)
MCH: 21.4 pg — ABNORMAL LOW (ref 26.0–34.0)
MCHC: 28.9 g/dL — AB (ref 30.0–36.0)
MCV: 74.1 fL — AB (ref 78.0–100.0)
Platelets: 409 10*3/uL — ABNORMAL HIGH (ref 150–400)
RBC: 4.95 MIL/uL (ref 3.87–5.11)
RDW: 19.7 % — ABNORMAL HIGH (ref 11.5–15.5)
WBC: 16.4 10*3/uL — ABNORMAL HIGH (ref 4.0–10.5)

## 2017-09-13 LAB — COMPREHENSIVE METABOLIC PANEL
ALBUMIN: 3.5 g/dL (ref 3.5–5.0)
ALT: 17 U/L (ref 0–44)
ANION GAP: 12 (ref 5–15)
AST: 21 U/L (ref 15–41)
Alkaline Phosphatase: 104 U/L (ref 38–126)
BILIRUBIN TOTAL: 0.4 mg/dL (ref 0.3–1.2)
BUN: 13 mg/dL (ref 6–20)
CHLORIDE: 106 mmol/L (ref 98–111)
CO2: 21 mmol/L — AB (ref 22–32)
Calcium: 9 mg/dL (ref 8.9–10.3)
Creatinine, Ser: 1.04 mg/dL — ABNORMAL HIGH (ref 0.44–1.00)
GFR calc Af Amer: >60 mL/min (ref >60)
GFR calc non Af Amer: >60 mL/min (ref >60)
GLUCOSE: 118 mg/dL — AB (ref 70–99)
POTASSIUM: 3.3 mmol/L — AB (ref 3.5–5.1)
SODIUM: 139 mmol/L (ref 135–145)
TOTAL PROTEIN: 7.5 g/dL (ref 6.5–8.1)

## 2017-09-13 LAB — I-STAT BETA HCG BLOOD, ED (MC, WL, AP ONLY)

## 2017-09-13 LAB — LIPASE, BLOOD: LIPASE: 41 U/L (ref 11–51)

## 2017-09-13 MED ORDER — SODIUM CHLORIDE 0.9 % IV BOLUS
1000.0000 mL | Freq: Once | INTRAVENOUS | Status: AC
  Administered 2017-09-13: 1000 mL via INTRAVENOUS

## 2017-09-13 MED ORDER — FAMOTIDINE IN NACL 20-0.9 MG/50ML-% IV SOLN
20.0000 mg | Freq: Once | INTRAVENOUS | Status: AC
  Administered 2017-09-13: 20 mg via INTRAVENOUS
  Filled 2017-09-13: qty 50

## 2017-09-13 MED ORDER — PANTOPRAZOLE SODIUM 40 MG PO TBEC: 40.0000 mg | DELAYED_RELEASE_TABLET | Freq: Two times a day (BID) | ORAL | 0 refills | Status: DC

## 2017-09-13 MED ORDER — HYDROMORPHONE HCL 1 MG/ML IJ SOLN
1.0000 mg | Freq: Once | INTRAMUSCULAR | Status: AC
  Administered 2017-09-13: 1 mg via INTRAVENOUS
  Filled 2017-09-13: qty 1

## 2017-09-13 MED ORDER — ONDANSETRON 4 MG PO TBDP: 4.0000 mg | ORAL_TABLET | Freq: Three times a day (TID) | ORAL | 0 refills | Status: DC | PRN

## 2017-09-13 MED ORDER — ONDANSETRON HCL 4 MG/2ML IJ SOLN
4.0000 mg | Freq: Once | INTRAMUSCULAR | Status: AC
  Administered 2017-09-13: 4 mg via INTRAVENOUS
  Filled 2017-09-13: qty 2

## 2017-09-13 MED ORDER — METOCLOPRAMIDE HCL 5 MG/ML IJ SOLN
10.0000 mg | Freq: Once | INTRAMUSCULAR | Status: AC
  Administered 2017-09-13: 10 mg via INTRAVENOUS
  Filled 2017-09-13: qty 2

## 2017-09-13 MED ORDER — DIPHENHYDRAMINE HCL 50 MG/ML IJ SOLN
25.0000 mg | Freq: Once | INTRAMUSCULAR | Status: AC
  Administered 2017-09-13: 25 mg via INTRAVENOUS
  Filled 2017-09-13: qty 1

## 2017-09-13 MED ORDER — LORAZEPAM 2 MG/ML IJ SOLN
1.0000 mg | Freq: Once | INTRAMUSCULAR | Status: AC
  Administered 2017-09-13: 1 mg via INTRAVENOUS
  Filled 2017-09-13: qty 1

## 2017-09-13 MED ORDER — MORPHINE SULFATE (PF) 4 MG/ML IV SOLN
4.0000 mg | Freq: Once | INTRAVENOUS | Status: AC
  Administered 2017-09-13: 4 mg via INTRAVENOUS
  Filled 2017-09-13: qty 1

## 2017-09-13 MED ORDER — PROMETHAZINE HCL 25 MG/ML IJ SOLN
25.0000 mg | Freq: Once | INTRAMUSCULAR | Status: AC
  Administered 2017-09-13: 25 mg via INTRAVENOUS
  Filled 2017-09-13: qty 1

## 2017-09-13 NOTE — ED Notes (Signed)
Pt stable, ambulatory, and verbalizes understanding of d/c instructions. Pt states her friend will be here at 1545 to transport her home.

## 2017-09-13 NOTE — ED Notes (Signed)
Got patient into a gown on the monitor patient is resting with call bell in reach 

## 2017-09-13 NOTE — ED Notes (Signed)
Wasted 1mg  Ativan with Latricia Heft, RN in sharps d/t pt being out of pyxis

## 2017-09-13 NOTE — ED Notes (Signed)
Walked patient to the bathroom for urine sample

## 2017-09-13 NOTE — ED Notes (Signed)
Walked patient to the bathroom  

## 2017-09-13 NOTE — ED Provider Notes (Signed)
[place] EMERGENCY DEPARTMENT Provider Note   CSN: [id number] Arrival date & time: 09/13/17  0957     History   Chief Complaint Chief Complaint  Patient presents with  . Abdominal Pain  . Emesis  . Nausea    HPI Haley Freeman is a 42 y.o. female.  Pt presents to the ED today with abd pain and n/v.  The pt has had these sx intermittently for 1 month.  She said she feels better in the ED, but then sx start again.  Her abd pain is the epigastric region.  No f/c.  Pt has had recent normal CT abd/pelvis and RUQ [name].     Past Medical History:  Diagnosis Date  . Alcohol abuse   . Anxiety   . Depression   . Headache(784.0)   . Neuromuscular disorder [facility])     Patient Active Problem List   Diagnosis Date Noted  . Nausea and vomiting 09/06/2017  . Dermoid 08/22/2017    Past Surgical History:  Procedure Laterality Date  . CESAREAN SECTION  '99 and '08   x 2  . LAPAROSCOPY Right 08/22/2017   Procedure: LAPAROSCOPY OPERATIVE removal of right ovary and right dermoid cyst;  Surgeon: Sloan Leiter, MD;  Location: [facility];  Service: Gynecology;  Laterality: Right;     OB History    Gravida  3   Para  3   Term      Preterm      AB      Living  3     SAB      TAB      Ectopic      Multiple      Live Births               Home Medications    Prior to Admission medications   Medication Sig Start Date End Date Taking? Authorizing Provider  ibuprofen (ADVIL,MOTRIN) 600 MG tablet Take 600 mg by mouth every 6 (six) hours as needed for mild pain.     [provider]  omeprazole (PRILOSEC) 20 MG capsule Take 2 capsules (40 mg total) by mouth daily. 09/09/17 10/09/17  Kinnie Feil, PA-C  ondansetron (ZOFRAN ODT) 4 MG disintegrating tablet Take 1 tablet (4 mg total) by mouth every 8 (eight) hours as needed. 09/13/17   Isla Pence, MD  oxyCODONE-acetaminophen (PERCOCET/ROXICET) 5-325 MG tablet Take 1-2 tablets by mouth  every 6 (six) hours as needed. 09/06/17   Jonnie Kind, MD  pantoprazole (PROTONIX) 40 MG tablet Take 1 tablet (40 mg total) by mouth 2 (two) times daily. 09/13/17   Isla Pence, MD  potassium chloride SA (K-DUR,KLOR-CON) 20 MEQ tablet Take 2 tablets (40 mEq total) by mouth 2 (two) times daily for 7 days. 09/09/17 09/16/17  Kinnie Feil, PA-C  promethazine (PHENERGAN) 25 MG tablet Take 1 tablet (25 mg total) by mouth every 6 (six) hours as needed for nausea or vomiting (nausea). 09/06/17   Jonnie Kind, MD  promethazine (PHENERGAN) 25 MG tablet Take 1 tablet (25 mg total) by mouth every 6 (six) hours as needed for nausea or vomiting. 09/09/17   Kinnie Feil, PA-C  ranitidine (ZANTAC) 300 MG tablet Take 1 tablet (300 mg total) by mouth at bedtime. 09/09/17 10/09/17  Kinnie Feil, PA-C  traZODone (DESYREL) 100 MG tablet Take 100 mg by mouth at bedtime.    [provider]    Family History No family history on  file.  Social History Social History   Tobacco Use  . Smoking status: Former Smoker    Packs/day: 0.50  . Smokeless tobacco: Never Used  Substance Use Topics  . Alcohol use: No  . Drug use: Yes    Types: Other-see comments, Marijuana    Comment: Occasionally smokes marijuana     Allergies   Strawberry (diagnostic) and Penicillins   Review of Systems Review of Systems  Gastrointestinal: Positive for abdominal pain, nausea and vomiting.  All other systems reviewed and are negative.    Physical Exam Updated Vital Signs BP 103/68   Pulse 82   Temp 99.1 F (37.3 C) (Oral)   Resp 18   Ht 5\' 8"  (1.727 m)   Wt (!) 146 kg   LMP 09/11/2017   SpO2 100%   BMI 48.94 kg/m   Physical Exam  Constitutional: She is oriented to person, place, and time. She appears well-developed and well-nourished.  HENT:  Head: Normocephalic and atraumatic.  Mouth/Throat: Mucous membranes are dry.  Eyes: Pupils are equal, round, and reactive to light. EOM are  normal.  Cardiovascular: Normal rate, regular rhythm, normal heart sounds and intact distal pulses.  Pulmonary/Chest: Effort normal and breath sounds normal.  Abdominal: Normal appearance and bowel sounds are normal. There is tenderness in the epigastric area.  Neurological: She is alert and oriented to person, place, and time.  Skin: Skin is warm. Capillary refill takes less than 2 seconds.  Psychiatric: She has a normal mood and affect. Her behavior is normal.  Nursing note and vitals reviewed.    ED Treatments / Results  Labs (all labs ordered are listed, but only abnormal results are displayed) Labs Reviewed  COMPREHENSIVE METABOLIC PANEL - Abnormal; Notable for the following components:      Result Value   Potassium 3.3 (*)    CO2 21 (*)    Glucose, Bld 118 (*)    Creatinine, Ser 1.04 (*)    All other components within normal limits  CBC - Abnormal; Notable for the following components:   WBC 16.4 (*)    Hemoglobin 10.6 (*)    MCV 74.1 (*)    MCH 21.4 (*)    MCHC 28.9 (*)    RDW 19.7 (*)    Platelets 409 (*)    All other components within normal limits  URINALYSIS, ROUTINE W REFLEX MICROSCOPIC - Abnormal; Notable for the following components:   Hgb urine dipstick MODERATE (*)    Protein, ur 30 (*)    All other components within normal limits  LIPASE, BLOOD  I-STAT BETA HCG BLOOD, ED ([facility], [facility], [facility] ONLY)    EKG None  Radiology No results found.  Procedures Procedures (including critical care time)  Medications Ordered in ED Medications  sodium chloride 0.9 % bolus 1,000 mL (0 mLs Intravenous Stopped 09/13/17 1218)  morphine 4 MG/ML injection 4 mg (4 mg Intravenous Given 09/13/17 1043)  ondansetron (ZOFRAN) injection 4 mg (4 mg Intravenous Given 09/13/17 1043)  famotidine (PEPCID) IVPB 20 mg premix (0 mg Intravenous Stopped 09/13/17 1113)  promethazine (PHENERGAN) injection 25 mg (25 mg Intravenous Given 09/13/17 1132)  metoCLOPramide (REGLAN) injection 10 mg (10 mg  Intravenous Given 09/13/17 1222)  diphenhydrAMINE (BENADRYL) injection 25 mg (25 mg Intravenous Given 09/13/17 1222)  HYDROmorphone (DILAUDID) injection 1 mg (1 mg Intravenous Given 09/13/17 1329)  LORazepam (ATIVAN) injection 1 mg (1 mg Intravenous Given 09/13/17 1329)     Initial Impression / Assessment and Plan / ED Course  I have reviewed the triage vital signs and the nursing notes.  Pertinent labs & imaging results that were available during my care of the patient were reviewed by me and considered in my medical decision making (see chart for details).    Pt is feeling much better.  She has had recent ct abd/pelvis and US done.  WBC elevated, but trending down.  She will be referred to GI.  She is told to double protonix.  Return if worse.  Final Clinical Impressions(s) / ED Diagnoses   Final diagnoses:  Non-intractable vomiting with nausea, unspecified vomiting type  Epigastric abdominal pain    ED Discharge Orders         Ordered    Ambulatory referral to Gastroenterology     09/13/17 1408    pantoprazole (PROTONIX) 40 MG tablet  2 times daily     09/13/17 1448    ondansetron (ZOFRAN ODT) 4 MG disintegrating tablet  Every 8 hours PRN     09/13/17 1448           Isla Pence, MD 09/13/17 1450

## 2017-09-13 NOTE — ED Notes (Addendum)
Wasted 1mg  of Ativan with Social research officer, government in sharps due to pt being d/c from pyxis.

## 2017-09-13 NOTE — ED Triage Notes (Signed)
Pt stated, Im having stomach pain with N/V. I was just discharged from [facility] with the same symptoms. Unsure what it was. I smoke Marijuana and they told me to stop and I did and its still coming back.

## 2017-09-13 NOTE — Discharge Instructions (Addendum)
Increase protonix to twice a day.  

## 2017-09-14 ENCOUNTER — Encounter (HOSPITAL_COMMUNITY): Payer: Self-pay | Admitting: Emergency Medicine

## 2017-09-14 ENCOUNTER — Other Ambulatory Visit: Payer: Self-pay

## 2017-09-14 ENCOUNTER — Emergency Department (HOSPITAL_COMMUNITY)
Admission: EM | Admit: 2017-09-14 | Discharge: 2017-09-15 | Disposition: A | Discharge status: Home / Self Care | Payer: Medicare Other | Attending: Emergency Medicine | Admitting: Emergency Medicine

## 2017-09-14 DIAGNOSIS — R1012 Left upper quadrant pain: Secondary | ICD-10-CM | POA: Insufficient documentation

## 2017-09-14 DIAGNOSIS — Z87891 Personal history of nicotine dependence: Secondary | ICD-10-CM | POA: Insufficient documentation

## 2017-09-14 DIAGNOSIS — R112 Nausea with vomiting, unspecified: Secondary | ICD-10-CM

## 2017-09-14 DIAGNOSIS — R9431 Abnormal electrocardiogram [ECG] [EKG]: Secondary | ICD-10-CM | POA: Diagnosis not present

## 2017-09-14 DIAGNOSIS — R1084 Generalized abdominal pain: Secondary | ICD-10-CM

## 2017-09-14 LAB — URINALYSIS, ROUTINE W REFLEX MICROSCOPIC
BILIRUBIN URINE: NEGATIVE
Bacteria, UA: NONE SEEN
GLUCOSE, UA: NEGATIVE mg/dL
Ketones, ur: NEGATIVE mg/dL
LEUKOCYTES UA: NEGATIVE
Nitrite: NEGATIVE
PROTEIN: NEGATIVE mg/dL
Specific Gravity, Urine: 1.018 (ref 1.005–1.030)
pH: 6 (ref 5.0–8.0)

## 2017-09-14 LAB — CBC
HCT: 35.2 % — ABNORMAL LOW (ref 36.0–46.0)
HEMOGLOBIN: 10.2 g/dL — AB (ref 12.0–15.0)
MCH: 21.2 pg — ABNORMAL LOW (ref 26.0–34.0)
MCHC: 29 g/dL — ABNORMAL LOW (ref 30.0–36.0)
MCV: 73 fL — AB (ref 78.0–100.0)
PLATELETS: 409 10*3/uL — AB (ref 150–400)
RBC: 4.82 MIL/uL (ref 3.87–5.11)
RDW: 19.5 % — ABNORMAL HIGH (ref 11.5–15.5)
WBC: 17.8 10*3/uL — AB (ref 4.0–10.5)

## 2017-09-14 LAB — I-STAT BETA HCG BLOOD, ED (MC, WL, AP ONLY): I-stat hCG, quantitative: <5 m[IU]/mL (ref <5)

## 2017-09-14 LAB — LIPASE, BLOOD: Lipase: 37 U/L (ref 11–51)

## 2017-09-14 LAB — COMPREHENSIVE METABOLIC PANEL
ALT: 16 U/L (ref 0–44)
ANION GAP: 10 (ref 5–15)
AST: 19 U/L (ref 15–41)
Albumin: 3.5 g/dL (ref 3.5–5.0)
Alkaline Phosphatase: 94 U/L (ref 38–126)
BUN: 9 mg/dL (ref 6–20)
CHLORIDE: 107 mmol/L (ref 98–111)
CO2: 23 mmol/L (ref 22–32)
Calcium: 9.2 mg/dL (ref 8.9–10.3)
Creatinine, Ser: 1 mg/dL (ref 0.44–1.00)
Glucose, Bld: 111 mg/dL — ABNORMAL HIGH (ref 70–99)
POTASSIUM: 3.2 mmol/L — AB (ref 3.5–5.1)
Sodium: 140 mmol/L (ref 135–145)
Total Bilirubin: 0.3 mg/dL (ref 0.3–1.2)
Total Protein: 7.3 g/dL (ref 6.5–8.1)

## 2017-09-14 MED ORDER — METOCLOPRAMIDE HCL 5 MG/ML IJ SOLN
10.0000 mg | Freq: Once | INTRAMUSCULAR | Status: AC
  Administered 2017-09-14: 10 mg via INTRAVENOUS
  Filled 2017-09-14: qty 2

## 2017-09-14 MED ORDER — PROMETHAZINE HCL 25 MG RE SUPP: 25.0000 mg | Freq: Three times a day (TID) | RECTAL | 1 refills | Status: DC

## 2017-09-14 MED ORDER — POTASSIUM CHLORIDE 10 MEQ/100ML IV SOLN
10.0000 meq | Freq: Once | INTRAVENOUS | Status: AC
  Administered 2017-09-14: 10 meq via INTRAVENOUS
  Filled 2017-09-14: qty 100

## 2017-09-14 MED ORDER — DIPHENHYDRAMINE HCL 50 MG/ML IJ SOLN
25.0000 mg | Freq: Once | INTRAMUSCULAR | Status: AC
  Administered 2017-09-14: 25 mg via INTRAVENOUS
  Filled 2017-09-14: qty 1

## 2017-09-14 MED ORDER — SODIUM CHLORIDE 0.9 % IV SOLN
Freq: Once | INTRAVENOUS | Status: AC
  Administered 2017-09-14: 22:00:00 via INTRAVENOUS

## 2017-09-14 MED ORDER — SODIUM CHLORIDE 0.9 % IV BOLUS
1000.0000 mL | Freq: Once | INTRAVENOUS | Status: AC
  Administered 2017-09-14: 1000 mL via INTRAVENOUS

## 2017-09-14 MED ORDER — DICYCLOMINE HCL 20 MG PO TABS: 20.0000 mg | ORAL_TABLET | Freq: Three times a day (TID) | ORAL | 0 refills | Status: DC

## 2017-09-14 MED ORDER — FAMOTIDINE IN NACL 20-0.9 MG/50ML-% IV SOLN
20.0000 mg | Freq: Once | INTRAVENOUS | Status: AC
  Administered 2017-09-14: 20 mg via INTRAVENOUS
  Filled 2017-09-14: qty 50

## 2017-09-14 MED ORDER — MORPHINE SULFATE (PF) 2 MG/ML IV SOLN
2.0000 mg | Freq: Once | INTRAVENOUS | Status: AC
  Administered 2017-09-14: 2 mg via INTRAVENOUS
  Filled 2017-09-14: qty 1

## 2017-09-14 MED ORDER — ONDANSETRON HCL 4 MG/2ML IJ SOLN
4.0000 mg | Freq: Once | INTRAMUSCULAR | Status: AC
  Administered 2017-09-14: 4 mg via INTRAVENOUS
  Filled 2017-09-14: qty 2

## 2017-09-14 NOTE — ED Notes (Signed)
Pot chloride 10 meq  Iv slowed to one half rate  Pt cc/o burning

## 2017-09-14 NOTE — ED Notes (Signed)
abd pain for 3 weeks she has been here numerus times for the same.  She was just here last pm  Loud dramatic retching  She coughs up sputum the  Makes loud retching noises

## 2017-09-14 NOTE — ED Triage Notes (Signed)
C/o recurrent sharp LUQ pain with nausea and vomiting since 3pm.  Reports she has been here multiple times for same.

## 2017-09-14 NOTE — ED Notes (Signed)
Patient able to ambulate independently to restroom.  Gait steady and even.  Patient continues to gag and dry heave.  Any emesis is clear

## 2017-09-14 NOTE — ED Provider Notes (Addendum)
[facility] EMERGENCY DEPARTMENT Provider Note  CSN: [id number] Arrival date & time: 09/14/17  1943  History   Chief Complaint Chief Complaint  Patient presents with  . Abdominal Pain  . Emesis    HPI Haley Freeman is a 42 y.o. female with a medical history of neuromuscular disorder who presented to the ED for LUQ and epigastric abdominal pain and N/V x1.5 weeks. Patient has been to the ED 4 times in this timeframe. Pain is describe as intermittent, stabbing and aching. She states that today's pain is the same as previous presentations and has not acutely worsened or changed significantly in quality or severity. Patient unable to identify when the pain is going to come/go. There are no specific worsening factors, but states that relief is only achieved with the IV medications that she receives in the ED.   This pain began 10 days following a GU procedure. She has tried PPIs, Zofran, Phenergan (PO) outside of the hospital without any significant relief. Patient currently not established with GI, but was given a referral yesterday.  Past Medical History:  Diagnosis Date  . Alcohol abuse   . Anxiety   . Depression   . Headache(784.0)   . Neuromuscular disorder [facility])     Patient Active Problem List   Diagnosis Date Noted  . Nausea and vomiting 09/06/2017  . Dermoid 08/22/2017    Past Surgical History:  Procedure Laterality Date  . CESAREAN SECTION  '99 and '08   x 2  . LAPAROSCOPY Right 08/22/2017   Procedure: LAPAROSCOPY OPERATIVE removal of right ovary and right dermoid cyst;  Surgeon: Sloan Leiter, MD;  Location: [facility];  Service: Gynecology;  Laterality: Right;     OB History    Gravida  3   Para  3   Term      Preterm      AB      Living  3     SAB      TAB      Ectopic      Multiple      Live Births               Home Medications    Prior to Admission medications   Medication Sig Start Date End Date Taking?  Authorizing Provider  dicyclomine (BENTYL) 20 MG tablet Take 1 tablet (20 mg total) by mouth 4 (four) times daily -  before meals and at bedtime for 14 days. 09/14/17 09/28/17  Adarian Bur, Alvie Heidelberg I, PA-C  ibuprofen (ADVIL,MOTRIN) 600 MG tablet Take 600 mg by mouth every 6 (six) hours as needed for mild pain.     [provider]  omeprazole (PRILOSEC) 20 MG capsule Take 2 capsules (40 mg total) by mouth daily. 09/09/17 10/09/17  Kinnie Feil, PA-C  ondansetron (ZOFRAN ODT) 4 MG disintegrating tablet Take 1 tablet (4 mg total) by mouth every 8 (eight) hours as needed. 09/13/17   Isla Pence, MD  oxyCODONE-acetaminophen (PERCOCET/ROXICET) 5-325 MG tablet Take 1-2 tablets by mouth every 6 (six) hours as needed. 09/06/17   Jonnie Kind, MD  pantoprazole (PROTONIX) 40 MG tablet Take 1 tablet (40 mg total) by mouth 2 (two) times daily. 09/13/17   Isla Pence, MD  potassium chloride SA (K-DUR,KLOR-CON) 20 MEQ tablet Take 2 tablets (40 mEq total) by mouth 2 (two) times daily for 7 days. 09/09/17 09/16/17  Kinnie Feil, PA-C  promethazine (PHENERGAN) 25 MG suppository Place 1 suppository (25 mg total)  rectally every 8 (eight) hours for 7 days. 09/14/17 09/21/17  Maizie Garno, Alvie Heidelberg I, PA-C  ranitidine (ZANTAC) 300 MG tablet Take 1 tablet (300 mg total) by mouth at bedtime. 09/09/17 10/09/17  Kinnie Feil, PA-C  traZODone (DESYREL) 100 MG tablet Take 100 mg by mouth at bedtime.    [provider]    Family History No family history on file.  Social History Social History   Tobacco Use  . Smoking status: Former Smoker    Packs/day: 0.50  . Smokeless tobacco: Never Used  Substance Use Topics  . Alcohol use: No  . Drug use: Yes    Types: Other-see comments, Marijuana    Comment: Occasionally smokes marijuana     Allergies   Strawberry (diagnostic) and Penicillins   Review of Systems Review of Systems  Constitutional: Negative for chills and fever.  Respiratory:  Negative.   Cardiovascular: Negative.   Gastrointestinal: Positive for abdominal pain, nausea and vomiting. Negative for abdominal distention, blood in stool, constipation and diarrhea.  Genitourinary: Negative for difficulty urinating, dysuria, frequency, menstrual problem, urgency, vaginal bleeding and vaginal pain.  Musculoskeletal: Negative.   Skin: Negative.    Physical Exam Updated Vital Signs BP (!) 138/106   Pulse 92   Temp 99.1 F (37.3 C) (Oral)   Resp (!) 24   LMP 09/11/2017   SpO2 100%   Physical Exam  Constitutional:  Non-toxic appearance.  Obese. Patient found rolling around on the bed in discomfort.  HENT:  Head: Normocephalic and atraumatic.  Mouth/Throat: Uvula is midline, oropharynx is clear and moist and mucous membranes are normal. No oral lesions. Normal dentition.  Cardiovascular: Normal rate and regular rhythm.  Pulmonary/Chest: Effort normal and breath sounds normal.  Abdominal: Soft. Normal appearance and bowel sounds are normal. She exhibits no distension. There is tenderness in the epigastric area and left upper quadrant. There is no rigidity, no rebound and no guarding.  Skin: Skin is warm. Capillary refill takes less than 2 seconds. No rash noted.  Nursing note and vitals reviewed.  ED Treatments / Results  Labs (all labs ordered are listed, but only abnormal results are displayed) Labs Reviewed  COMPREHENSIVE METABOLIC PANEL - Abnormal; Notable for the following components:      Result Value   Potassium 3.2 (*)    Glucose, Bld 111 (*)    All other components within normal limits  CBC - Abnormal; Notable for the following components:   WBC 17.8 (*)    Hemoglobin 10.2 (*)    HCT 35.2 (*)    MCV 73.0 (*)    MCH 21.2 (*)    MCHC 29.0 (*)    RDW 19.5 (*)    Platelets 409 (*)    All other components within normal limits  URINALYSIS, ROUTINE W REFLEX MICROSCOPIC - Abnormal; Notable for the following components:   Hgb urine dipstick LARGE (*)     RBC / HPF >50 (*)    All other components within normal limits  LIPASE, BLOOD  I-STAT BETA HCG BLOOD, ED ([facility], [facility], [facility] ONLY)    EKG None  Radiology No results found.  Procedures Procedures (including critical care time)  Medications Ordered in ED Medications  sodium chloride 0.9 % bolus 1,000 mL (1,000 mLs Intravenous New Bag/Given 09/14/17 2207)  famotidine (PEPCID) IVPB 20 mg premix (0 mg Intravenous Stopped 09/14/17 2251)  ondansetron (ZOFRAN) injection 4 mg (4 mg Intravenous Given 09/14/17 2209)  morphine 2 MG/ML injection 2 mg (2 mg Intravenous Given  09/14/17 2210)  potassium chloride 10 mEq in 100 mL IVPB (10 mEq Intravenous New Bag/Given 09/14/17 2300)  0.9 %  sodium chloride infusion ( Intravenous Restarted 09/14/17 2259)  metoCLOPramide (REGLAN) injection 10 mg (10 mg Intravenous Given 09/14/17 2255)  diphenhydrAMINE (BENADRYL) injection 25 mg (25 mg Intravenous Given 09/14/17 2255)     Initial Impression / Assessment and Plan / ED Course  Triage vital signs and the nursing notes have been reviewed.  Pertinent labs & imaging results that were available during care of the patient were reviewed and considered in medical decision making (see chart for details).  Patient presents afebrile for abdominal pain and N/V that have been persistent for the last 1.5 weeks. It is reassuring that there has been no acute change or worsening in the patient's complaints since its onset and there is no indication by history, labs or physical exam to perform new abdominal imaging. Abdominal imaging to this date includes CT, x-ray and U/S all of which resulted with no acute or emergent findings for pain. She has not been evaluated for GI yet, but has referral sent. Patient reports constant problems with nausea which has affected her ability to keep down the prescribed medications. Will provide patient with acute symptom relief and then plans to discharge with GI follow-up.  Clinical Course as of Sep 16 39    Nancy Fetter Sep 14, 2017  2154 Labs consistent to yesterday's labs.   [GM]  2202 UA shows large Hgb, but patient reports currently on her menstrual cycle.   [GM]  2232 Hypokalemic at 3.2. No associated changes on EKG or physical complaints of hypokalemia. QT of 396.    [GM]  Mon Sep 15, 2017  0036 Nausea controlled with IV Reglan and Benadryl.   [GM]    Clinical Course User Index [GM] Romie Jumper, PA-C    Final Clinical Impressions(s) / ED Diagnoses  1. Abdominal Pain with Nausea/Vomiting. Symptom relief achieved with IV management. Rx for Phenergan suppositories and Bentyl for additional relief. Given GI referral at discharge yesterday and advised to follow-up them. Education provided on s/s that warrant return to ED vs PCP vs GI.  Dispo: Home. After thorough clinical evaluation, this patient is determined to be medically stable and can be safely discharged with the previously mentioned treatment and/or outpatient follow-up/referral(s). At this time, there are no other apparent medical conditions that require further screening, evaluation or treatment.   Final diagnoses:  Generalized abdominal pain  Non-intractable vomiting with nausea, unspecified vomiting type    ED Discharge Orders         Ordered    promethazine (PHENERGAN) 25 MG suppository  Every 8 hours     09/14/17 2151    dicyclomine (BENTYL) 20 MG tablet  3 times daily before meals & bedtime     09/14/17 2151            Maury Dus I, PA-C 09/15/17 0037    Romie Jumper, PA-C 09/15/17 0041    Noemi Chapel, MD 09/16/17 [phone]

## 2017-09-15 ENCOUNTER — Encounter (HOSPITAL_COMMUNITY): Payer: Self-pay | Admitting: *Deleted

## 2017-09-15 ENCOUNTER — Inpatient Hospital Stay (HOSPITAL_COMMUNITY)
Admission: AD | Admit: 2017-09-15 | Discharge: 2017-09-16 | Disposition: A | Discharge status: Home / Self Care | Payer: Medicare Other | Source: Ambulatory Visit | Attending: Obstetrics & Gynecology | Admitting: Obstetrics & Gynecology

## 2017-09-15 ENCOUNTER — Inpatient Hospital Stay (EMERGENCY_DEPARTMENT_HOSPITAL)
Admission: AD | Admit: 2017-09-15 | Discharge: 2017-09-15 | Disposition: A | Discharge status: Home / Self Care | Payer: Medicare Other | Source: Ambulatory Visit | Attending: Obstetrics & Gynecology | Admitting: Obstetrics & Gynecology

## 2017-09-15 ENCOUNTER — Emergency Department (HOSPITAL_COMMUNITY)
Admission: EM | Admit: 2017-09-15 | Discharge: 2017-09-15 | Disposition: A | Discharge status: Home / Self Care | Payer: Medicare Other | Source: Home / Self Care

## 2017-09-15 ENCOUNTER — Other Ambulatory Visit: Payer: Self-pay

## 2017-09-15 ENCOUNTER — Encounter (HOSPITAL_COMMUNITY): Payer: Self-pay

## 2017-09-15 ENCOUNTER — Encounter (HOSPITAL_COMMUNITY): Payer: Self-pay | Admitting: Emergency Medicine

## 2017-09-15 DIAGNOSIS — R1116 Cannabis hyperemesis syndrome: Secondary | ICD-10-CM

## 2017-09-15 DIAGNOSIS — F12988 Cannabis use, unspecified with other cannabis-induced disorder: Secondary | ICD-10-CM | POA: Diagnosis not present

## 2017-09-15 DIAGNOSIS — R079 Chest pain, unspecified: Secondary | ICD-10-CM

## 2017-09-15 DIAGNOSIS — R1012 Left upper quadrant pain: Secondary | ICD-10-CM | POA: Diagnosis not present

## 2017-09-15 DIAGNOSIS — R109 Unspecified abdominal pain: Secondary | ICD-10-CM

## 2017-09-15 DIAGNOSIS — Z5321 Procedure and treatment not carried out due to patient leaving prior to being seen by health care provider: Secondary | ICD-10-CM | POA: Insufficient documentation

## 2017-09-15 DIAGNOSIS — F12188 Cannabis abuse with other cannabis-induced disorder: Secondary | ICD-10-CM | POA: Diagnosis present

## 2017-09-15 DIAGNOSIS — R112 Nausea with vomiting, unspecified: Secondary | ICD-10-CM

## 2017-09-15 LAB — COMPREHENSIVE METABOLIC PANEL
ALK PHOS: 97 U/L (ref 38–126)
ALT: 14 U/L (ref 0–44)
ANION GAP: 12 (ref 5–15)
AST: 18 U/L (ref 15–41)
Albumin: 3.7 g/dL (ref 3.5–5.0)
BUN: 7 mg/dL (ref 6–20)
CHLORIDE: 105 mmol/L (ref 98–111)
CO2: 20 mmol/L — AB (ref 22–32)
Calcium: 8.9 mg/dL (ref 8.9–10.3)
Creatinine, Ser: 0.87 mg/dL (ref 0.44–1.00)
GFR calc non Af Amer: >60 mL/min (ref >60)
Glucose, Bld: 119 mg/dL — ABNORMAL HIGH (ref 70–99)
Potassium: 3.1 mmol/L — ABNORMAL LOW (ref 3.5–5.1)
SODIUM: 137 mmol/L (ref 135–145)
Total Bilirubin: 0.1 mg/dL — ABNORMAL LOW (ref 0.3–1.2)
Total Protein: 7.2 g/dL (ref 6.5–8.1)

## 2017-09-15 LAB — URINALYSIS, ROUTINE W REFLEX MICROSCOPIC
BILIRUBIN URINE: NEGATIVE
Bacteria, UA: NONE SEEN
Glucose, UA: NEGATIVE mg/dL
KETONES UR: 5 mg/dL — AB
LEUKOCYTES UA: NEGATIVE
Nitrite: NEGATIVE
Protein, ur: NEGATIVE mg/dL
SPECIFIC GRAVITY, URINE: 1.011 (ref 1.005–1.030)
pH: 7 (ref 5.0–8.0)

## 2017-09-15 LAB — CBC
HCT: 33.5 % — ABNORMAL LOW (ref 36.0–46.0)
HEMOGLOBIN: 10.2 g/dL — AB (ref 12.0–15.0)
MCH: 21.7 pg — AB (ref 26.0–34.0)
MCHC: 30.4 g/dL (ref 30.0–36.0)
MCV: 71.3 fL — ABNORMAL LOW (ref 78.0–100.0)
Platelets: 310 10*3/uL (ref 150–400)
RBC: 4.7 MIL/uL (ref 3.87–5.11)
RDW: 19.4 % — ABNORMAL HIGH (ref 11.5–15.5)
WBC: 15.8 10*3/uL — ABNORMAL HIGH (ref 4.0–10.5)

## 2017-09-15 MED ORDER — FAMOTIDINE IN NACL 20-0.9 MG/50ML-% IV SOLN
20.0000 mg | Freq: Once | INTRAVENOUS | Status: AC
  Administered 2017-09-15: 20 mg via INTRAVENOUS
  Filled 2017-09-15: qty 50

## 2017-09-15 MED ORDER — MORPHINE SULFATE (PF) 2 MG/ML IV SOLN
2.0000 mg | Freq: Once | INTRAVENOUS | Status: AC
  Administered 2017-09-15: 2 mg via INTRAVENOUS
  Filled 2017-09-15: qty 1

## 2017-09-15 MED ORDER — ONDANSETRON 4 MG PO TBDP
8.0000 mg | ORAL_TABLET | Freq: Once | ORAL | Status: AC
  Administered 2017-09-15: 8 mg via ORAL
  Filled 2017-09-15: qty 2

## 2017-09-15 MED ORDER — HALOPERIDOL LACTATE 5 MG/ML IJ SOLN
2.0000 mg | Freq: Once | INTRAMUSCULAR | Status: AC
  Administered 2017-09-15: 2 mg via INTRAVENOUS
  Filled 2017-09-15: qty 0.4

## 2017-09-15 MED ORDER — LACTATED RINGERS IV BOLUS
1000.0000 mL | Freq: Once | INTRAVENOUS | Status: AC
  Administered 2017-09-15: 1000 mL via INTRAVENOUS

## 2017-09-15 MED ORDER — SODIUM CHLORIDE 0.9 % IV SOLN
8.0000 mg | Freq: Once | INTRAVENOUS | Status: AC
  Administered 2017-09-15: 8 mg via INTRAVENOUS
  Filled 2017-09-15: qty 4

## 2017-09-15 MED ORDER — LORAZEPAM 2 MG/ML IJ SOLN
2.0000 mg | Freq: Once | INTRAMUSCULAR | Status: AC
  Administered 2017-09-15: 2 mg via INTRAVENOUS
  Filled 2017-09-15: qty 1

## 2017-09-15 MED ORDER — ONDANSETRON HCL 4 MG/2ML IJ SOLN
4.0000 mg | Freq: Once | INTRAMUSCULAR | Status: AC
  Administered 2017-09-15: 4 mg via INTRAVENOUS
  Filled 2017-09-15: qty 2

## 2017-09-15 NOTE — Discharge Instructions (Signed)
I have prescribed you Phenergan suppositories which you insert in your rectum for nausea. I recommend that you use these 3-4 times daily for the next 3-5 days at least to help with the nausea. This should put the nausea at rest so you can take the other medication. I have also prescribed you Bentyl which helps with abdominal pain.  It is very important that you follow-up with GI. Call the office that you were referred to in the morning to schedule an appointment.  Return to the emergency room if any of your symptoms get worse. Until you get fully evaluated by GI, you will likely continue to have abdominal pain, nausea and vomiting.  It is important for you to remain hydrated and drink plenty of water.

## 2017-09-15 NOTE — MAU Note (Signed)
Pt here with stomach pain and continued vomiting.

## 2017-09-15 NOTE — MAU Provider Note (Signed)
History     CSN: [id number]  Arrival date and time: 09/15/17 2037   First Provider Initiated Contact with Patient 09/15/17 2146      Chief Complaint  Patient presents with  . Abdominal Pain  . Emesis   HPI  Ms.  Haley Freeman is a 42 y.o. year old G63P3 female who presents to MAU reporting abdominal pain and continued N/V. She was seen here earlier today for the same sx's; in addition to 5 ED visits for the same symptoms in the last 2 weeks. She was seen in the ED on 09/14/17. She reports upper abdominal pain and vomiting bile. She has not been able to keep any food, fluids or medications down today. Her symptoms only improve when she gets in a hot shower or comes to ED for IVFs and IV medications. She has a GI appt on 09/16/17. She denies diarrhea or fever. She admits to a long-term use of marijuana; last reported use was 09/11/17.  Past Medical History:  Diagnosis Date  . Alcohol abuse   . Anxiety   . Depression   . Headache(784.0)   . Neuromuscular disorder [facility])     Past Surgical History:  Procedure Laterality Date  . CESAREAN SECTION  '99 and '08   x 2  . LAPAROSCOPY Right 08/22/2017   Procedure: LAPAROSCOPY OPERATIVE removal of right ovary and right dermoid cyst;  Surgeon: Sloan Leiter, MD;  Location: [facility];  Service: Gynecology;  Laterality: Right;    History reviewed. No pertinent family history.  Social History   Tobacco Use  . Smoking status: Former Smoker    Packs/day: 0.50  . Smokeless tobacco: Never Used  Substance Use Topics  . Alcohol use: No  . Drug use: Yes    Types: Other-see comments, Marijuana    Comment: Occasionally smokes marijuana Reports last  time 09/11/17 but at party on 09/13/17    Allergies:  Allergies  Allergen Reactions  . Strawberry (Diagnostic) Shortness Of Breath  . Penicillins Swelling    Swells throat. Has patient had a PCN reaction causing immediate rash, facial/tongue/throat swelling, SOB or lightheadedness with hypotension:  Yes Has patient had a PCN reaction causing severe rash involving mucus membranes or skin necrosis: No Has patient had a PCN reaction that required hospitalization: Yes Has patient had a PCN reaction occurring within the last 10 years: No If all of the above answers are "NO", then may proceed with Cephalosporin use.     Medications Prior to Admission  Medication Sig Dispense Refill Last Dose  . dicyclomine (BENTYL) 20 MG tablet Take 1 tablet (20 mg total) by mouth 4 (four) times daily -  before meals and at bedtime for 14 days. 56 tablet 0 09/15/2017 at 1900  . ibuprofen (ADVIL,MOTRIN) 600 MG tablet Take 600 mg by mouth every 6 (six) hours as needed for mild pain.    Past Week at Unknown time  . omeprazole (PRILOSEC) 20 MG capsule Take 2 capsules (40 mg total) by mouth daily. 60 capsule 0 09/14/2017 at Unknown time  . ondansetron (ZOFRAN ODT) 4 MG disintegrating tablet Take 1 tablet (4 mg total) by mouth every 8 (eight) hours as needed. 10 tablet 0 09/15/2017 at Unknown time  . oxyCODONE-acetaminophen (PERCOCET/ROXICET) 5-325 MG tablet Take 1-2 tablets by mouth every 6 (six) hours as needed. 20 tablet 0 Past Week at Unknown time  . pantoprazole (PROTONIX) 40 MG tablet Take 1 tablet (40 mg total) by mouth 2 (two) times daily. [place]  tablet 0 09/14/2017 at Unknown time  . potassium chloride SA (K-DUR,KLOR-CON) 20 MEQ tablet Take 2 tablets (40 mEq total) by mouth 2 (two) times daily for 7 days. 28 tablet 0 09/15/2017 at Unknown time  . promethazine (PHENERGAN) 25 MG suppository Place 1 suppository (25 mg total) rectally every 8 (eight) hours for 7 days. (Patient taking differently: Place 25 mg rectally every 8 (eight) hours as needed for nausea or vomiting. ) 21 suppository 1 09/15/2017 at 1800  . ranitidine (ZANTAC) 300 MG tablet Take 1 tablet (300 mg total) by mouth at bedtime. 30 tablet 0 09/15/2017 at Unknown time  . traZODone (DESYREL) 100 MG tablet Take 100 mg by mouth at bedtime.   More than a month at Unknown  time    Review of Systems  Constitutional: Positive for appetite change.  HENT: Negative.   Eyes: Negative.   Respiratory: Negative.   Cardiovascular: Negative.   Gastrointestinal: Positive for abdominal pain, nausea and vomiting.  Endocrine: Negative.   Genitourinary: Negative.   Musculoskeletal: Negative.   Skin: Negative.   Allergic/Immunologic: Negative.   Neurological: Positive for weakness.  Hematological: Negative.   Psychiatric/Behavioral: Negative.    Physical Exam   Blood pressure 134/75, pulse 72, temperature 98.8 F (37.1 C), temperature source Oral, resp. rate 18, last menstrual period 09/11/2017.  Physical Exam  Nursing note and vitals reviewed. Constitutional: She is oriented to person, place, and time. She appears well-developed and well-nourished.  HENT:  Head: Normocephalic.  Eyes: Pupils are equal, round, and reactive to light.  Neck: Normal range of motion.  Cardiovascular: Normal rate.  Respiratory: Effort normal.  GI: Soft. Bowel sounds are decreased.  Musculoskeletal: Normal range of motion.  Neurological: She is alert and oriented to person, place, and time.  Skin: Skin is warm and dry.  Psychiatric: She has a normal mood and affect. Her behavior is normal. Judgment and thought content normal.    MAU Course  Procedures  MDM LR bolus Ativan 2 mg IVP Zofran 4 mg IVP Pepcid 20 mg IVPB N/V stopped with IVFs and medications -- "feels much better"  Assessment and Plan  Cannabis hyperemesis syndrome concurrent with and due to cannabis abuse ([place]) - Plan: Discharge patient - Advised that [facility] is not the best facility to care for her non-OB/GYN problems - Advised to seek care a t [facility], [facility] or Urgent Care if her sx's return - Encouraged to stop the use of marijuana, because the use of it is worsening  - Discharge home - Patient verbalized an understanding of the plan of care and agrees.    Laury Deep, MSN, CNM 09/15/2017, 9:50 PM

## 2017-09-15 NOTE — ED Notes (Signed)
Pt's daughter aggressive and stating she will not allow her mother to sit here. She states she can go elsewhere. She is drumming her nails on wheelchair. RN discussed with her that waits will hopefully begin to move. Dellis Filbert to discuss and RN to get nausea meds.

## 2017-09-15 NOTE — MAU Note (Signed)
Pt reports upper abd pain off/on for 3 weeks, worsening today. Nausea and vomiting.

## 2017-09-15 NOTE — ED Triage Notes (Signed)
Pt reports abd pain and chest pain, seen here last night and discharged home, has been here multiple times for the same, states she was diagnosed with a gastric issue and given meds but they are not working

## 2017-09-15 NOTE — ED Notes (Addendum)
Pt states she is leaving- daughter called [facility] and they said they can see her. Rn notified charge.

## 2017-09-15 NOTE — Discharge Instructions (Signed)
Cannabinoid Hyperemesis Syndrome Cannabinoid hyperemesis syndrome (CHS) is a condition that causes repeated nausea, vomiting, and abdominal pain after long-term (chronic) use of marijuana (cannabis). People with CHS typically use marijuana 3-5 times a day for many years before they have symptoms, although it is possible to develop CHS with as little as 1 use per day. Symptoms of CHS may be mild at first but can get worse and more frequent. In some cases, CHS may cause vomiting many times a day, which can lead to weight loss and dehydration. CHS may go away and come back many times (recur). People may not have symptoms or may otherwise be healthy in between [facility] attacks. What are the causes? The exact cause of this condition is not known. Long-term use of marijuana may over-stimulate certain proteins in the brain that react with chemicals in marijuana (cannabinoid receptors). This over-stimulation may cause CHS. What are the signs or symptoms? Symptoms of this condition are often mild during the first few attacks, but they can get worse over time. Symptoms may include:  Frequent nausea, especially early in the morning.  Vomiting.  Abdominal pain.  Taking several hot showers throughout the day can also be a sign of this condition. People with CHS may do this because it relieves symptoms. How is this diagnosed? This condition may be diagnosed based on:  Your symptoms and medical history, including any drug use.  A physical exam.  You may have tests done to rule out other problems. These tests may include:  Blood tests.  Urine tests.  Imaging tests, such as an X-ray or CT scan.  How is this treated? Treatment for this condition involves stopping marijuana use. Your health care provider may recommend:  A drug rehabilitation program, if you have trouble stopping marijuana use.  Medicines for nausea.  Hot showers to help relieve symptoms.  Certain creams that contain a substance called  capsaicin may improve symptoms when applied to the abdomen. Ask your health care provider before starting any medicines or other treatments. Severe nausea and vomiting may require you to stay at the hospital. You may need IV fluids to prevent or treat dehydration. You may also need certain medicines that must be given at the hospital. Follow these instructions at home: During an attack  Stay in bed and rest in a dark, quiet room.  Take anti-nausea medicine as told by your health care provider.  Try taking hot showers to relieve your symptoms. After an attack  Drink small amounts of clear fluids slowly. Gradually add more.  Once you are able to eat without vomiting, eat soft foods in small amounts every 3-4 hours. General instructions  Do not use any products that contain marijuana.If you need help quitting, ask your health care provider for resources and treatment options.  Drink enough fluid to keep your urine pale yellow. Avoid drinking fluids that have a lot of sugar or caffeine, such as coffee and soda.  Take and apply over-the-counter and prescription medicines only as told by your health care provider. Ask your health care provider before starting any new medicines or treatments.  Keep all follow-up visits as told by your health care provider. This is important. Contact a health care provider if:  Your symptoms get worse.  You cannot drink fluids without vomiting.  You have pain and trouble swallowing after an attack. Get help right away if:  You cannot stop vomiting.  You have blood in your vomit or your vomit looks like coffee grounds.  You have severe abdominal pain.  You have stools that are bloody or black, or stools that look like tar.  You have symptoms of dehydration, such as: ? Sunken eyes. ? Inability to make tears. ? Cracked lips. ? Dry mouth. ? Decreased urine production. ? Weakness. ? Sleepiness. ? Fainting. Summary  Cannabinoid hyperemesis  syndrome (CHS) is a condition that causes repeated nausea, vomiting, and abdominal pain after long-term use of marijuana.  People with CHS typically use marijuana 3-5 times a day for many years before they have symptoms, although it is possible to develop CHS with as little as 1 use per day.  Treatment for this condition involves stopping marijuana use. Hot showers and capsaicin creams may also help relieve symptoms. Ask your health care provider before starting any medicines or other treatments.  Your health care provider may prescribe medicines to help with nausea.  Get help right away if you have signs of dehydration, such as dry mouth, decreased urine production, or weakness. This information is not intended to replace advice given to you by your health care provider. Make sure you discuss any questions you have with your health care provider. Document Released: 04/03/2016 Document Revised: 04/03/2016 Document Reviewed: 04/03/2016 Elsevier Interactive Patient Education  [employer].

## 2017-09-15 NOTE — MAU Provider Note (Signed)
Chief Complaint: Abdominal Pain; Emesis; and Nausea   First Provider Initiated Contact with Patient 09/15/17 1154     SUBJECTIVE HPI: Haley Freeman is a 42 y.o. G3P3 at Unknown who presents to Maternity Admissions reporting abdominal pain & n/v. Has had 5 ED visits for these symptoms in the last 2 weeks. Last seen in ED yesterday.  Reports epigastric pain & n/v. Vomited 10+ times today. Has trouble keeping down her meds, food, or fluids. Symptoms improved with IV meds she gets in the ED. Has had ultrasounds & 2 CTs that were negative.  Long history of marijuana use. Last used on Thursday. Symptoms only improve when in the shower.  Has referral to GI, is waiting to hear from them for appointment.  No fever or diarrhea.   Location: epigastrum Quality: sharp Severity: 10/10 on pain scale Duration: 2 weeks Timing: intermittent Modifying factors: improved with IV meds & showers Associated signs and symptoms: n/v  Past Medical History:  Diagnosis Date  . Alcohol abuse   . Anxiety   . Depression   . Headache(784.0)   . Neuromuscular disorder ([place])    OB History  Gravida Para Term Preterm AB Living  3 3       3   SAB TAB Ectopic Multiple Live Births               # Outcome Date GA Lbr Len/2nd Weight Sex Delivery Anes PTL Lv  3 Para      CS-Unspec     2 Para      Vag-Spont     1 Para      CS-Unspec      Past Surgical History:  Procedure Laterality Date  . CESAREAN SECTION  '99 and '08   x 2  . LAPAROSCOPY Right 08/22/2017   Procedure: LAPAROSCOPY OPERATIVE removal of right ovary and right dermoid cyst;  Surgeon: Sloan Leiter, MD;  Location: [facility];  Service: Gynecology;  Laterality: Right;   Social History   Socioeconomic History  . Marital status: Divorced    Spouse name: Not on file  . Number of children: Not on file  . Years of education: Not on file  . Highest education level: Not on file  Occupational History  . Not on file  Social Needs  . Financial  resource strain: Not on file  . Food insecurity:    Worry: Not on file    Inability: Not on file  . Transportation needs:    Medical: Not on file    Non-medical: Not on file  Tobacco Use  . Smoking status: Former Smoker    Packs/day: 0.50  . Smokeless tobacco: Never Used  Substance and Sexual Activity  . Alcohol use: No  . Drug use: Yes    Types: Other-see comments, Marijuana    Comment: Occasionally smokes marijuana  . Sexual activity: Not Currently  Lifestyle  . Physical activity:    Days per week: Not on file    Minutes per session: Not on file  . Stress: Not on file  Relationships  . Social connections:    Talks on phone: Not on file    Gets together: Not on file    Attends religious service: Not on file    Active member of club or organization: Not on file    Attends meetings of clubs or organizations: Not on file    Relationship status: Not on file  . Intimate partner violence:    Fear of current or  ex partner: Not on file    Emotionally abused: Not on file    Physically abused: Not on file    Forced sexual activity: Not on file  Other Topics Concern  . Not on file  Social History Narrative  . Not on file   History reviewed. No pertinent family history. No current facility-administered medications on file prior to encounter.    Current Outpatient Medications on File Prior to Encounter  Medication Sig Dispense Refill  . dicyclomine (BENTYL) 20 MG tablet Take 1 tablet (20 mg total) by mouth 4 (four) times daily -  before meals and at bedtime for 14 days. 56 tablet 0  . ibuprofen (ADVIL,MOTRIN) 600 MG tablet Take 600 mg by mouth every 6 (six) hours as needed for mild pain.     Marland Kitchen omeprazole (PRILOSEC) 20 MG capsule Take 2 capsules (40 mg total) by mouth daily. 60 capsule 0  . ondansetron (ZOFRAN ODT) 4 MG disintegrating tablet Take 1 tablet (4 mg total) by mouth every 8 (eight) hours as needed. 10 tablet 0  . oxyCODONE-acetaminophen (PERCOCET/ROXICET) 5-325 MG  tablet Take 1-2 tablets by mouth every 6 (six) hours as needed. 20 tablet 0  . pantoprazole (PROTONIX) 40 MG tablet Take 1 tablet (40 mg total) by mouth 2 (two) times daily. 20 tablet 0  . potassium chloride SA (K-DUR,KLOR-CON) 20 MEQ tablet Take 2 tablets (40 mEq total) by mouth 2 (two) times daily for 7 days. 28 tablet 0  . promethazine (PHENERGAN) 25 MG suppository Place 1 suppository (25 mg total) rectally every 8 (eight) hours for 7 days. (Patient taking differently: Place 25 mg rectally every 8 (eight) hours as needed for nausea or vomiting. ) 21 suppository 1  . ranitidine (ZANTAC) 300 MG tablet Take 1 tablet (300 mg total) by mouth at bedtime. 30 tablet 0  . traZODone (DESYREL) 100 MG tablet Take 100 mg by mouth at bedtime.     Allergies  Allergen Reactions  . Strawberry (Diagnostic) Shortness Of Breath  . Penicillins Swelling    Swells throat. Has patient had a PCN reaction causing immediate rash, facial/tongue/throat swelling, SOB or lightheadedness with hypotension: Yes Has patient had a PCN reaction causing severe rash involving mucus membranes or skin necrosis: No Has patient had a PCN reaction that required hospitalization: Yes Has patient had a PCN reaction occurring within the last 10 years: No If all of the above answers are "NO", then may proceed with Cephalosporin use.     I have reviewed patient's Past Medical Hx, Surgical Hx, Family Hx, Social Hx, medications and allergies.   Review of Systems  Constitutional: Negative.   Gastrointestinal: Positive for abdominal pain, nausea and vomiting. Negative for constipation and diarrhea.  Genitourinary: Negative.     OBJECTIVE Patient Vitals for the past 24 hrs:  BP Temp Temp src Pulse Resp SpO2 Height Weight  09/15/17 1152 (!) 147/83 - - 72 - - - -  09/15/17 1125 (!) 158/102 98.7 F (37.1 C) Oral 79 18 100 % 5\' 8"  (1.727 m) (!) 143.3 kg   Constitutional: Well-developed, well-nourished female in no acute distress.   Cardiovascular: normal rate & rhythm, no murmur Respiratory: normal rate and effort. Lung sounds clear throughout GI: TTP in mid-epigastrum & RUQ, negative murphy's sign. Abd soft, Pos BS x 4. No  guarding or rebound tenderness MS: Extremities nontender, no edema, normal ROM Neurologic: Alert and oriented x 4.   LAB RESULTS Results for orders placed or performed during the hospital encounter  of 09/15/17 (from the past 24 hour(s))  Urinalysis, Routine w reflex microscopic     Status: Abnormal   Collection Time: 09/15/17 11:51 AM  Result Value Ref Range   Color, Urine YELLOW YELLOW   APPearance CLEAR CLEAR   Specific Gravity, Urine 1.011 1.005 - 1.030   pH 7.0 5.0 - 8.0   Glucose, UA NEGATIVE NEGATIVE mg/dL   Hgb urine dipstick LARGE (A) NEGATIVE   Bilirubin Urine NEGATIVE NEGATIVE   Ketones, ur 5 (A) NEGATIVE mg/dL   Protein, ur NEGATIVE NEGATIVE mg/dL   Nitrite NEGATIVE NEGATIVE   Leukocytes, UA NEGATIVE NEGATIVE   RBC / HPF 21-50 0 - 5 RBC/hpf   WBC, UA 0-5 0 - 5 WBC/hpf   Bacteria, UA NONE SEEN NONE SEEN   Squamous Epithelial / LPF 0-5 0 - 5   Mucus PRESENT   CBC     Status: Abnormal   Collection Time: 09/15/17 12:18 PM  Result Value Ref Range   WBC 15.8 (H) 4.0 - 10.5 K/uL   RBC 4.70 3.87 - 5.11 MIL/uL   Hemoglobin 10.2 (L) 12.0 - 15.0 g/dL   HCT 33.5 (L) 36.0 - 46.0 %   MCV 71.3 (L) 78.0 - 100.0 fL   MCH 21.7 (L) 26.0 - 34.0 pg   MCHC 30.4 30.0 - 36.0 g/dL   RDW 19.4 (H) 11.5 - 15.5 %   Platelets 310 150 - 400 K/uL  Comprehensive metabolic panel     Status: Abnormal   Collection Time: 09/15/17 12:18 PM  Result Value Ref Range   Sodium 137 135 - 145 mmol/L   Potassium 3.1 (L) 3.5 - 5.1 mmol/L   Chloride 105 98 - 111 mmol/L   CO2 20 (L) 22 - 32 mmol/L   Glucose, Bld 119 (H) 70 - 99 mg/dL   BUN 7 6 - 20 mg/dL   Creatinine, Ser 0.87 0.44 - 1.00 mg/dL   Calcium 8.9 8.9 - 10.3 mg/dL   Total Protein 7.2 6.5 - 8.1 g/dL   Albumin 3.7 3.5 - 5.0 g/dL   AST 18 15 - 41  U/L   ALT 14 0 - 44 U/L   Alkaline Phosphatase 97 38 - 126 U/L   Total Bilirubin 0.1 (L) 0.3 - 1.2 mg/dL   GFR calc non Af Amer >60 >60 mL/min   GFR calc Af Amer >60 >60 mL/min   Anion gap 12 5 - 15    IMAGING No results found.  MAU COURSE Orders Placed This Encounter  Procedures  . Urinalysis, Routine w reflex microscopic  . CBC  . Comprehensive metabolic panel  . Discharge patient   Meds ordered this encounter  Medications  . lactated ringers bolus 1,000 mL  . famotidine (PEPCID) IVPB 20 mg premix  . ondansetron (ZOFRAN) 8 mg in sodium chloride 0.9 % 50 mL IVPB  . haloperidol lactate (HALDOL) injection 2 mg    MDM IV fluids, haldol 2 mg, pepcid 20 mg, & zofran 8 mg Labs stable Long discussion with patient regarding CHS diagnosis. Strongly encouraged her to quit smoking. Also needs to f/u with GI.   ASSESSMENT 1. Cannabinoid hyperemesis syndrome ([place])     PLAN Discharge home in stable condition.  [place] Follow up.   Contact information: [place] [phone] [phone]          Allergies as of 09/15/2017      Reactions   Strawberry (diagnostic) Shortness Of Breath  Penicillins Swelling   Swells throat. Has patient had a PCN reaction causing immediate rash, facial/tongue/throat swelling, SOB or lightheadedness with hypotension: Yes Has patient had a PCN reaction causing severe rash involving mucus membranes or skin necrosis: No Has patient had a PCN reaction that required hospitalization: Yes Has patient had a PCN reaction occurring within the last 10 years: No If all of the above answers are "NO", then may proceed with Cephalosporin use.      Medication List    TAKE these medications   dicyclomine 20 MG tablet Commonly known as:  BENTYL Take 1 tablet (20 mg total) by mouth 4 (four) times daily -  before meals and at bedtime for 14 days.   ibuprofen 600 MG tablet Commonly known as:   ADVIL,MOTRIN Take 600 mg by mouth every 6 (six) hours as needed for mild pain.   omeprazole 20 MG capsule Commonly known as:  PRILOSEC Take 2 capsules (40 mg total) by mouth daily.   ondansetron 4 MG disintegrating tablet Commonly known as:  ZOFRAN-ODT Take 1 tablet (4 mg total) by mouth every 8 (eight) hours as needed.   oxyCODONE-acetaminophen 5-325 MG tablet Commonly known as:  PERCOCET/ROXICET Take 1-2 tablets by mouth every 6 (six) hours as needed.   pantoprazole 40 MG tablet Commonly known as:  PROTONIX Take 1 tablet (40 mg total) by mouth 2 (two) times daily.   potassium chloride SA 20 MEQ tablet Commonly known as:  K-DUR,KLOR-CON Take 2 tablets (40 mEq total) by mouth 2 (two) times daily for 7 days.   promethazine 25 MG suppository Commonly known as:  PHENERGAN Place 1 suppository (25 mg total) rectally every 8 (eight) hours for 7 days. What changed:    when to take this  reasons to take this   ranitidine 300 MG tablet Commonly known as:  ZANTAC Take 1 tablet (300 mg total) by mouth at bedtime.   traZODone 100 MG tablet Commonly known as:  DESYREL Take 100 mg by mouth at bedtime.        Jorje Guild, NP 09/15/2017  1:15 PM

## 2017-09-15 NOTE — ED Notes (Signed)
Pt has disconnected herself from all her lines

## 2017-09-16 ENCOUNTER — Ambulatory Visit (HOSPITAL_COMMUNITY)
Admission: AD | Admit: 2017-09-16 | Discharge: 2017-09-16 | Disposition: A | Discharge status: Home / Self Care | Payer: Medicare Other | Source: Ambulatory Visit | Attending: Gastroenterology | Admitting: Gastroenterology

## 2017-09-16 ENCOUNTER — Ambulatory Visit (HOSPITAL_COMMUNITY): Payer: Medicare Other | Admitting: Certified Registered Nurse Anesthetist

## 2017-09-16 ENCOUNTER — Encounter (HOSPITAL_COMMUNITY): Payer: Self-pay | Admitting: *Deleted

## 2017-09-16 ENCOUNTER — Telehealth: Payer: Self-pay

## 2017-09-16 ENCOUNTER — Other Ambulatory Visit: Payer: Self-pay | Admitting: Gastroenterology

## 2017-09-16 ENCOUNTER — Encounter (HOSPITAL_COMMUNITY)
Admission: AD | Disposition: A | Discharge status: Home / Self Care | Payer: Self-pay | Source: Ambulatory Visit | Attending: Gastroenterology

## 2017-09-16 DIAGNOSIS — F4310 Post-traumatic stress disorder, unspecified: Secondary | ICD-10-CM | POA: Diagnosis not present

## 2017-09-16 DIAGNOSIS — Z720 Tobacco use: Secondary | ICD-10-CM | POA: Diagnosis not present

## 2017-09-16 DIAGNOSIS — F329 Major depressive disorder, single episode, unspecified: Secondary | ICD-10-CM | POA: Diagnosis not present

## 2017-09-16 DIAGNOSIS — K449 Diaphragmatic hernia without obstruction or gangrene: Secondary | ICD-10-CM | POA: Insufficient documentation

## 2017-09-16 DIAGNOSIS — F419 Anxiety disorder, unspecified: Secondary | ICD-10-CM | POA: Diagnosis not present

## 2017-09-16 DIAGNOSIS — Z880 Allergy status to penicillin: Secondary | ICD-10-CM | POA: Diagnosis not present

## 2017-09-16 DIAGNOSIS — K3189 Other diseases of stomach and duodenum: Secondary | ICD-10-CM | POA: Diagnosis not present

## 2017-09-16 DIAGNOSIS — R51 Headache: Secondary | ICD-10-CM | POA: Insufficient documentation

## 2017-09-16 DIAGNOSIS — K219 Gastro-esophageal reflux disease without esophagitis: Secondary | ICD-10-CM | POA: Insufficient documentation

## 2017-09-16 DIAGNOSIS — F12188 Cannabis abuse with other cannabis-induced disorder: Secondary | ICD-10-CM | POA: Diagnosis present

## 2017-09-16 DIAGNOSIS — Z8659 Personal history of other mental and behavioral disorders: Secondary | ICD-10-CM | POA: Diagnosis not present

## 2017-09-16 DIAGNOSIS — Z90721 Acquired absence of ovaries, unilateral: Secondary | ICD-10-CM | POA: Diagnosis not present

## 2017-09-16 DIAGNOSIS — K2980 Duodenitis without bleeding: Secondary | ICD-10-CM | POA: Diagnosis not present

## 2017-09-16 DIAGNOSIS — K2970 Gastritis, unspecified, without bleeding: Secondary | ICD-10-CM | POA: Diagnosis not present

## 2017-09-16 DIAGNOSIS — R112 Nausea with vomiting, unspecified: Secondary | ICD-10-CM | POA: Diagnosis not present

## 2017-09-16 DIAGNOSIS — F1210 Cannabis abuse, uncomplicated: Secondary | ICD-10-CM | POA: Diagnosis present

## 2017-09-16 HISTORY — PX: ESOPHAGOGASTRODUODENOSCOPY: SHX5428

## 2017-09-16 SURGERY — EGD (ESOPHAGOGASTRODUODENOSCOPY)
Anesthesia: Monitor Anesthesia Care

## 2017-09-16 MED ORDER — FENTANYL CITRATE (PF) 100 MCG/2ML IJ SOLN
INTRAMUSCULAR | Status: DC | PRN
  Administered 2017-09-16: 50 ug via INTRAVENOUS

## 2017-09-16 MED ORDER — LIDOCAINE 2% (20 MG/ML) 5 ML SYRINGE
INTRAMUSCULAR | Status: DC | PRN
  Administered 2017-09-16: 40 mg via INTRAVENOUS

## 2017-09-16 MED ORDER — LACTATED RINGERS IV SOLN
INTRAVENOUS | Status: DC | PRN
  Administered 2017-09-16: 12:00:00 via INTRAVENOUS

## 2017-09-16 MED ORDER — ONDANSETRON HCL 4 MG/2ML IJ SOLN
INTRAMUSCULAR | Status: AC
  Filled 2017-09-16: qty 2

## 2017-09-16 MED ORDER — PROPOFOL 10 MG/ML IV BOLUS
INTRAVENOUS | Status: DC | PRN
  Administered 2017-09-16: 30 mg via INTRAVENOUS

## 2017-09-16 MED ORDER — ONDANSETRON HCL 4 MG/2ML IJ SOLN
4.0000 mg | Freq: Once | INTRAMUSCULAR | Status: AC
  Administered 2017-09-16: 4 mg via INTRAVENOUS

## 2017-09-16 MED ORDER — FENTANYL CITRATE (PF) 100 MCG/2ML IJ SOLN
INTRAMUSCULAR | Status: AC
  Filled 2017-09-16: qty 2

## 2017-09-16 MED ORDER — PROPOFOL 500 MG/50ML IV EMUL
INTRAVENOUS | Status: DC | PRN
  Administered 2017-09-16: 125 ug/kg/min via INTRAVENOUS

## 2017-09-16 NOTE — Transfer of Care (Signed)
Immediate Anesthesia Transfer of Care Note  Patient: Haley Freeman  Procedure(s) Performed: ESOPHAGOGASTRODUODENOSCOPY (EGD) (N/A )  Patient Location: Endoscopy Unit  Anesthesia Type:MAC  Level of Consciousness: awake, alert  and oriented  Airway & Oxygen Therapy: Patient Spontanous Breathing  Post-op Assessment: Report given to RN and Post -op Vital signs reviewed and stable  Post vital signs: Reviewed and stable  Last Vitals:  Vitals Value Taken Time  BP 144/82 09/16/2017 12:08 PM  Temp 37.1 C 09/16/2017 12:07 PM  Pulse 92 09/16/2017 12:13 PM  Resp 19 09/16/2017 12:13 PM  SpO2 99 % 09/16/2017 12:13 PM  Vitals shown include unvalidated device data.  Last Pain:  Vitals:   09/16/17 1207  TempSrc: Oral  PainSc: 5          Complications: No apparent anesthesia complications

## 2017-09-16 NOTE — Discharge Instructions (Signed)
In late February 2020, the [facility] will be moving to the [facility] At that time, the MAU (Maternity Admissions Unit), where you are being seen today, will no longer take care of non-pregnant patients. We strongly encourage you to find a doctor's office before that time, so that you can be seen with any GYN concerns, like vaginal discharge, urinary tract infection, etc.. in a timely manner.  In order to make an office visit more convenient, the [facility] for [place] [facility] will be offering evening hours with same-day appointments, walk-in appointments and scheduled appointments available during this time.  [facility] Hours: Monday - 8am - 7:30 pm with walk-in between 4pm- 7:30 pm Tuesday - 8 am - 5 pm open late and accepting walk-ins from 4pm - 7:30pm Wednesday - 8 am - 5 pm open late and accepting walk-ins from 4pm - 7:30pm Thursday 8 am - 5 pm (starting 10/09/17 we will be open late and accepting walk-ins from 4pm - 7:30pm) Friday 8 am - 5 pm  For an appointment please call the Center for [place] @ [facility] at [phone]  For urgent needs, [facility] is also available for management of urgent GYN complaints such as vaginal discharge or urinary tract infections.

## 2017-09-16 NOTE — Discharge Instructions (Signed)
YOU HAD AN ENDOSCOPIC PROCEDURE TODAY: Refer to the procedure report and other information in the discharge instructions given to you for any specific questions about what was found during the examination. If this information does not answer your questions, please call [facility] office at [phone] to clarify.   YOU SHOULD EXPECT: Some feelings of bloating in the abdomen. Passage of more gas than usual. Walking can help get rid of the air that was put into your GI tract during the procedure and reduce the bloating. If you had a lower endoscopy (such as a colonoscopy or flexible sigmoidoscopy) you may notice spotting of blood in your stool or on the toilet paper. Some abdominal soreness may be present for a day or two, also.  DIET: Your first meal following the procedure should be a light meal and then it is ok to progress to your normal diet. A half-sandwich or bowl of soup is an example of a good first meal. Heavy or fried foods are harder to digest and may make you feel nauseous or bloated. Drink plenty of fluids but you should avoid alcoholic beverages for 24 hours. If you had a esophageal dilation, please see attached instructions for diet.   ACTIVITY: Your care partner should take you home directly after the procedure. You should plan to take it easy, moving slowly for the rest of the day. You can resume normal activity the day after the procedure however YOU SHOULD NOT DRIVE, use power tools, machinery or perform tasks that involve climbing or major physical exertion for 24 hours (because of the sedation medicines used during the test).   SYMPTOMS TO REPORT IMMEDIATELY: A gastroenterologist can be reached at any hour. Please call [phone]  for any of the following symptoms:   Following lower endoscopy (colonoscopy, flexible sigmoidoscopy) Excessive amounts of blood in the stool  Significant tenderness, worsening of abdominal pains  Swelling of the abdomen that is new, acute  Fever of 100  or higher   Following upper endoscopy (EGD, EUS, ERCP, esophageal dilation) Vomiting of blood or coffee ground material  New, significant abdominal pain  New, significant chest pain or pain under the shoulder blades  Painful or persistently difficult swallowing  New shortness of breath  Black, tarry-looking or red, bloody stools  FOLLOW UP:  If any biopsies were taken you will be contacted by phone or by letter within the next 1-3 weeks. Call [phone]  if you have not heard about the biopsies in 3 weeks.  Please also call with any specific questions about appointments or follow up tests. Slowly advance diet as tolerated stop smoking or products including marijuana trying medicine Reglan 5 mg 30 minutes before meals and she will call this information and follow-up with Dr. Oletta Lamas in 1 week and get in to see your gynecologist asap and avoid aspirin and nonsteroidals including ibuprofen Goody's PCT's etc. And take your protonix twice  a day

## 2017-09-16 NOTE — Progress Notes (Signed)
Haley Freeman 11:48 AM  Subjective: Patient seen and examined and discussed with my partner Dr. Oletta Lamas who saw her in the office today and her problem has started after her surgery workup to date has been negative including multiple CTs and labs profiles except for a slight elevated white count  Objective: Vital signs stable afebrile patient and constant motion will not lay still exam please see preassessment evaluation to not duplicate the pain Assessment: Abdominal pain questionable etiology  Plan: Okay to proceed with endoscopy with the risks and procedure was discussed with the patient and her daughter  [facility]  Pager [phone] After 5PM or if no answer call [phone]

## 2017-09-16 NOTE — Anesthesia Preprocedure Evaluation (Signed)
Anesthesia Evaluation  Patient identified by MRN, date of birth, ID band Patient awake    Reviewed: Allergy & Precautions, NPO status , Patient's Chart, lab work & pertinent test results  History of Anesthesia Complications Negative for: history of anesthetic complications  Airway Mallampati: I  TM Distance: >3 FB Neck ROM: Full    Dental  (+) Dental Advisory Given   Pulmonary former smoker,    breath sounds clear to auscultation       Cardiovascular (-) anginanegative cardio ROS   Rhythm:Regular Rate:Normal     Neuro/Psych  Headaches, Anxiety Depression    GI/Hepatic Neg liver ROS, GERD  Medicated,Nausea and vomiting for 3 weeks   Endo/Other  Morbid obesity  Renal/GU negative Renal ROS     Musculoskeletal   Abdominal (+) + obese,   Peds  Hematology negative hematology ROS (+)   Anesthesia Other Findings   Reproductive/Obstetrics LMP currently                             Anesthesia Physical Anesthesia Plan  ASA: III  Anesthesia Plan: MAC   Post-op Pain Management:    Induction:   PONV Risk Score and Plan: 2 and Treatment may vary due to age or medical condition, Ondansetron and Diphenhydramine  Airway Management Planned: Natural Airway and Nasal Cannula  Additional Equipment:   Intra-op Plan:   Post-operative Plan:   Informed Consent: I have reviewed the patients History and Physical, chart, labs and discussed the procedure including the risks, benefits and alternatives for the proposed anesthesia with the patient or authorized representative who has indicated his/her understanding and acceptance.   Dental advisory given  Plan Discussed with: CRNA and Surgeon  Anesthesia Plan Comments: (Plan routine monitors, MAC)        Anesthesia Quick Evaluation

## 2017-09-16 NOTE — Anesthesia Procedure Notes (Signed)
Procedure Name: MAC Performed by: Zephyr Ridley B, CRNA Oxygen Delivery Method: Nasal cannula Airway Equipment and Method: Bite block Placement Confirmation: positive ETCO2 Dental Injury: Teeth and Oropharynx as per pre-operative assessment        

## 2017-09-16 NOTE — Op Note (Signed)
[facility] Patient Name: Haley Freeman Procedure Date : 09/16/2017 MRN: [id number] Attending MD: Clarene Essex , MD Date of Birth: 01-20-1975 CSN: [id number] Age: 42 Admit Type: Inpatient Procedure:                Upper GI endoscopy Indications:              Epigastric abdominal pain, Nausea with vomiting Providers:                Clarene Essex, MD, Baird Cancer, RN, Charolette Child,                            Technician, Edmonia James, CRNA Referring MD:              Medicines:                Propofol total dose 130 mg IV, Fentanyl 50                            micrograms IV, Ondansetron 4 mg IV,40 mg IV                            lidocaine Complications:            No immediate complications. Estimated Blood Loss:     Estimated blood loss: none. Procedure:                Pre-Anesthesia Assessment:                           - Prior to the procedure, a History and Physical                            was performed, and patient medications and                            allergies were reviewed. The patient's tolerance of                            previous anesthesia was also reviewed. The risks                            and benefits of the procedure and the sedation                            options and risks were discussed with the patient.                            All questions were answered, and informed consent                            was obtained. Prior Anticoagulants: The patient has                            taken ibuprofen, last dose was 1 day prior to  procedure. ASA Grade Assessment: III - A patient                            with severe systemic disease. After reviewing the                            risks and benefits, the patient was deemed in                            satisfactory condition to undergo the procedure.                           After obtaining informed consent, the endoscope was   passed under direct vision. Throughout the                            procedure, the patient's blood pressure, pulse, and                            oxygen saturations were monitored continuously. The                            GIF-H190 ([id number]) Olympus adult EGD was introduced                            through the mouth, and advanced to the fourth part                            of duodenum. The upper GI endoscopy was                            accomplished without difficulty. The patient                            tolerated the procedure well. Scope In: Scope Out: Findings:      The larynx was normal.      A medium-sized hiatal hernia was present.      The entire examined stomach was normal.      Patchy mildly erythematous mucosa without active bleeding and with no       stigmata of bleeding was found in the duodenal bulb.      The second portion of the duodenum, third portion of the duodenum and       fourth portion of the duodenum were normal.      The exam was otherwise without abnormality. Impression:               - Normal larynx.                           - Medium-sized hiatal hernia.                           - Normal stomach.                           - Erythematous duodenopathy.                           -  Normal second portion of the duodenum, third                            portion of the duodenum and fourth portion of the                            duodenum.                           - The examination was otherwise normal.                           - No specimens collected. Recommendation:           - Patient has a contact number available for                            emergencies. The signs and symptoms of potential                            delayed complications were discussed with the                            patient. Return to normal activities tomorrow.                            Written discharge instructions were provided to the                             patient.                           - Clear liquid diet today.                           - Continue present medications.increase Protonix to                            twice a day trial of Reglan before meals and no                            aspirin or nonsteroidals and stop marijuana and                            tobacco and draping products                           - Return to GI clinic in 1 week. follow-up with her                            gynecologist ASAP                           - Telephone GI clinic if symptomatic PRN. Procedure Code(s):        --- Professional ---  43838, Esophagogastroduodenoscopy, flexible,                            transoral; diagnostic, including collection of                            specimen(s) by brushing or washing, when performed                            (separate procedure) Diagnosis Code(s):        --- Professional ---                           K44.9, Diaphragmatic hernia without obstruction or                            gangrene                           K31.89, Other diseases of stomach and duodenum                           R10.13, Epigastric pain                           R11.2, Nausea with vomiting, unspecified CPT copyright 2017 [employer] Association. All rights reserved. The codes documented in this report are preliminary and upon coder review may  be revised to meet current compliance requirements. Clarene Essex, MD 09/16/2017 12:11:44 PM This report has been signed electronically. Number of Addenda: 0

## 2017-09-16 NOTE — Anesthesia Postprocedure Evaluation (Signed)
Anesthesia Post Note  Patient: Jheri C Prentiss-Epps  Procedure(s) Performed: ESOPHAGOGASTRODUODENOSCOPY (EGD) (N/A )     Patient location during evaluation: Endoscopy Anesthesia Type: MAC Level of consciousness: awake and alert, oriented and patient cooperative Pain management: pain level controlled Vital Signs Assessment: post-procedure vital signs reviewed and stable Respiratory status: spontaneous breathing, nonlabored ventilation and respiratory function stable Cardiovascular status: blood pressure returned to baseline and stable Postop Assessment: no apparent nausea or vomiting Anesthetic complications: no    Last Vitals:  Vitals:   09/16/17 1210 09/16/17 1220  BP: (!) 144/82 (!) 170/95  Pulse: 85 83  Resp: 18 14  Temp:    SpO2: 100% 100%    Last Pain:  Vitals:   09/16/17 1220  TempSrc:   PainSc: 5                  Jones Viviani,E. Dontavious Emily

## 2017-09-17 ENCOUNTER — Encounter (HOSPITAL_COMMUNITY): Payer: Self-pay | Admitting: Gastroenterology

## 2017-09-19 NOTE — Telephone Encounter (Signed)
error 

## 2017-09-21 ENCOUNTER — Other Ambulatory Visit: Payer: Self-pay

## 2017-09-21 ENCOUNTER — Emergency Department (HOSPITAL_COMMUNITY)
Admission: EM | Admit: 2017-09-21 | Discharge: 2017-09-21 | Disposition: A | Discharge status: Home / Self Care | Payer: Medicare Other | Attending: Emergency Medicine | Admitting: Emergency Medicine

## 2017-09-21 ENCOUNTER — Encounter (HOSPITAL_COMMUNITY): Payer: Self-pay

## 2017-09-21 ENCOUNTER — Inpatient Hospital Stay (HOSPITAL_COMMUNITY)
Admission: AD | Admit: 2017-09-21 | Discharge: 2017-09-21 | Discharge status: Against Medical Advice | Payer: Medicare Other | Source: Ambulatory Visit | Attending: Family Medicine | Admitting: Family Medicine

## 2017-09-21 DIAGNOSIS — R112 Nausea with vomiting, unspecified: Secondary | ICD-10-CM | POA: Insufficient documentation

## 2017-09-21 DIAGNOSIS — Z79899 Other long term (current) drug therapy: Secondary | ICD-10-CM | POA: Diagnosis not present

## 2017-09-21 DIAGNOSIS — Z87891 Personal history of nicotine dependence: Secondary | ICD-10-CM | POA: Diagnosis not present

## 2017-09-21 DIAGNOSIS — R1084 Generalized abdominal pain: Secondary | ICD-10-CM | POA: Diagnosis not present

## 2017-09-21 LAB — COMPREHENSIVE METABOLIC PANEL
ALT: 19 U/L (ref 0–44)
ANION GAP: 12 (ref 5–15)
AST: 22 U/L (ref 15–41)
Albumin: 3.6 g/dL (ref 3.5–5.0)
Alkaline Phosphatase: 96 U/L (ref 38–126)
BUN: 8 mg/dL (ref 6–20)
CO2: 21 mmol/L — ABNORMAL LOW (ref 22–32)
Calcium: 9.4 mg/dL (ref 8.9–10.3)
Chloride: 108 mmol/L (ref 98–111)
Creatinine, Ser: 1.03 mg/dL — ABNORMAL HIGH (ref 0.44–1.00)
GFR calc Af Amer: >60 mL/min (ref >60)
GFR calc non Af Amer: >60 mL/min (ref >60)
GLUCOSE: 128 mg/dL — AB (ref 70–99)
Potassium: 3.2 mmol/L — ABNORMAL LOW (ref 3.5–5.1)
Sodium: 141 mmol/L (ref 135–145)
Total Bilirubin: 0.4 mg/dL (ref 0.3–1.2)
Total Protein: 7.6 g/dL (ref 6.5–8.1)

## 2017-09-21 LAB — LIPASE, BLOOD: Lipase: 39 U/L (ref 11–51)

## 2017-09-21 LAB — CBC WITH DIFFERENTIAL/PLATELET
Basophils Absolute: 0 10*3/uL (ref 0.0–0.1)
Basophils Relative: 0 %
Eosinophils Absolute: 0 10*3/uL (ref 0.0–0.7)
Eosinophils Relative: 0 %
HCT: 34.6 % — ABNORMAL LOW (ref 36.0–46.0)
Hemoglobin: 10.6 g/dL — ABNORMAL LOW (ref 12.0–15.0)
Lymphocytes Relative: 15 %
Lymphs Abs: 2.4 10*3/uL (ref 0.7–4.0)
MCH: 21.6 pg — ABNORMAL LOW (ref 26.0–34.0)
MCHC: 30.6 g/dL (ref 30.0–36.0)
MCV: 70.6 fL — ABNORMAL LOW (ref 78.0–100.0)
Monocytes Absolute: 0.3 10*3/uL (ref 0.1–1.0)
Monocytes Relative: 2 %
Neutro Abs: 13.3 10*3/uL — ABNORMAL HIGH (ref 1.7–7.7)
Neutrophils Relative %: 83 %
Platelets: 352 10*3/uL (ref 150–400)
RBC: 4.9 MIL/uL (ref 3.87–5.11)
RDW: 18.5 % — ABNORMAL HIGH (ref 11.5–15.5)
WBC: 16 10*3/uL — ABNORMAL HIGH (ref 4.0–10.5)

## 2017-09-21 LAB — URINALYSIS, ROUTINE W REFLEX MICROSCOPIC
Bacteria, UA: NONE SEEN
Bilirubin Urine: NEGATIVE
Glucose, UA: NEGATIVE mg/dL
Hgb urine dipstick: NEGATIVE
Ketones, ur: NEGATIVE mg/dL
Nitrite: NEGATIVE
Protein, ur: 100 mg/dL — AB
Specific Gravity, Urine: 1.026 (ref 1.005–1.030)
pH: 6 (ref 5.0–8.0)

## 2017-09-21 LAB — POCT PREGNANCY, URINE: Preg Test, Ur: NEGATIVE

## 2017-09-21 LAB — I-STAT BETA HCG BLOOD, ED (MC, WL, AP ONLY): I-stat hCG, quantitative: <5 m[IU]/mL (ref <5)

## 2017-09-21 MED ORDER — SODIUM CHLORIDE 0.9 % IV BOLUS
1000.0000 mL | Freq: Once | INTRAVENOUS | Status: AC
  Administered 2017-09-21: 1000 mL via INTRAVENOUS

## 2017-09-21 MED ORDER — DIPHENHYDRAMINE HCL 50 MG/ML IJ SOLN
50.0000 mg | Freq: Once | INTRAMUSCULAR | Status: AC
  Administered 2017-09-21: 50 mg via INTRAVENOUS
  Filled 2017-09-21: qty 1

## 2017-09-21 MED ORDER — CAPSAICIN 0.025 % EX CREA
TOPICAL_CREAM | Freq: Once | CUTANEOUS | Status: DC
  Filled 2017-09-21: qty 60

## 2017-09-21 MED ORDER — GI COCKTAIL ~~LOC~~
30.0000 mL | Freq: Once | ORAL | Status: AC
  Administered 2017-09-21: 30 mL via ORAL
  Filled 2017-09-21: qty 30

## 2017-09-21 MED ORDER — METOCLOPRAMIDE HCL 5 MG/ML IJ SOLN
10.0000 mg | Freq: Once | INTRAMUSCULAR | Status: AC
  Administered 2017-09-21: 10 mg via INTRAVENOUS
  Filled 2017-09-21: qty 2

## 2017-09-21 MED ORDER — PROCHLORPERAZINE EDISYLATE 10 MG/2ML IJ SOLN
10.0000 mg | Freq: Once | INTRAMUSCULAR | Status: AC
  Administered 2017-09-21: 10 mg via INTRAVENOUS
  Filled 2017-09-21: qty 2

## 2017-09-21 MED ORDER — SCOPOLAMINE 1 MG/3DAYS TD PT72
1.0000 | MEDICATED_PATCH | TRANSDERMAL | Status: DC
  Administered 2017-09-21: 1.5 mg via TRANSDERMAL
  Filled 2017-09-21: qty 1

## 2017-09-21 MED ORDER — FAMOTIDINE IN NACL 20-0.9 MG/50ML-% IV SOLN
20.0000 mg | Freq: Once | INTRAVENOUS | Status: AC
  Administered 2017-09-21: 20 mg via INTRAVENOUS
  Filled 2017-09-21: qty 50

## 2017-09-21 MED ORDER — HALOPERIDOL LACTATE 5 MG/ML IJ SOLN
5.0000 mg | Freq: Once | INTRAMUSCULAR | Status: AC
  Administered 2017-09-21: 5 mg via INTRAVENOUS
  Filled 2017-09-21: qty 1

## 2017-09-21 NOTE — ED Notes (Signed)
EKG given to EDP,Yelverton,MD., for review. 

## 2017-09-21 NOTE — ED Triage Notes (Signed)
She reports n/v frequently x 3 weeks. She also c/o some generalized abd. Pain.

## 2017-09-21 NOTE — MAU Note (Signed)
Pt required wheelchair from the bathroom. Put patient in the lobby after triage and then she got up from the wheelchair several times to come back and use the bathroom again. Pt waited another 15 minutes and then stated she couldn't wait any longer and was going to [facility] because she was in too much pain. Had pt sign AMA form.

## 2017-09-21 NOTE — ED Notes (Signed)
Bed: FB37 Expected date:  Expected time:  Means of arrival:  Comments: 42yo abd pain

## 2017-09-21 NOTE — ED Provider Notes (Addendum)
[facility] DEPT Provider Note   CSN: [id number] Arrival date & time: 09/21/17  1535     History   Chief Complaint Chief Complaint  Patient presents with  . Emesis    HPI Haley Freeman is a 42 y.o. female.  42 yo F with a chief complaint of nausea and vomiting.  This is been a recurrent issue for her.  She thinks it feels the same as the previous.  Denies fevers or chills.  Denies focal abdominal tenderness.  Diffuse crampy.  Yellowish emesis.  Not able to keep anything down.  The history is provided by the patient.  Emesis   This is a recurrent problem. The current episode started 2 days ago. The problem occurs continuously. The problem has not changed since onset.There has been no fever. Associated symptoms include abdominal pain. Pertinent negatives include no arthralgias, no chills, no fever, no headaches and no myalgias.    Past Medical History:  Diagnosis Date  . Alcohol abuse   . Anxiety   . Depression   . Headache(784.0)   . Neuromuscular disorder [facility])     Patient Active Problem List   Diagnosis Date Noted  . Cannabis hyperemesis syndrome concurrent with and due to cannabis abuse ([place]) 09/16/2017  . Nausea and vomiting 09/06/2017  . Dermoid 08/22/2017    Past Surgical History:  Procedure Laterality Date  . CESAREAN SECTION  '99 and '08   x 2  . ESOPHAGOGASTRODUODENOSCOPY N/A 09/16/2017   Procedure: ESOPHAGOGASTRODUODENOSCOPY (EGD) WITH PROPOFOL;  Surgeon: Clarene Essex, MD;  Location: [facility];  Service: Endoscopy;  Laterality: N/A;  . LAPAROSCOPY Right 08/22/2017   Procedure: LAPAROSCOPY OPERATIVE removal of right ovary and right dermoid cyst;  Surgeon: Sloan Leiter, MD;  Location: [facility];  Service: Gynecology;  Laterality: Right;     OB History    Gravida  3   Para  3   Term      Preterm      AB      Living  3     SAB      TAB      Ectopic      Multiple      Live Births                Home Medications    Prior to Admission medications   Medication Sig Start Date End Date Taking? Authorizing Provider  dicyclomine (BENTYL) 20 MG tablet Take 1 tablet (20 mg total) by mouth 4 (four) times daily -  before meals and at bedtime for 14 days. 09/14/17 09/28/17 Yes Mortis, Jonelle Sports, PA-C  omeprazole (PRILOSEC) 20 MG capsule Take 2 capsules (40 mg total) by mouth daily. 09/09/17 10/09/17 Yes Carmon Sails J, PA-C  ondansetron (ZOFRAN ODT) 4 MG disintegrating tablet Take 1 tablet (4 mg total) by mouth every 8 (eight) hours as needed. 09/13/17  Yes Isla Pence, MD  pantoprazole (PROTONIX) 40 MG tablet Take 1 tablet (40 mg total) by mouth 2 (two) times daily. 09/13/17  Yes Isla Pence, MD  ranitidine (ZANTAC) 300 MG tablet Take 1 tablet (300 mg total) by mouth at bedtime. 09/09/17 10/09/17 Yes Kinnie Feil, PA-C  traZODone (DESYREL) 100 MG tablet Take 300 mg by mouth at bedtime.    Yes [provider]  oxyCODONE-acetaminophen (PERCOCET/ROXICET) 5-325 MG tablet Take 1-2 tablets by mouth every 6 (six) hours as needed. Patient not taking: Reported on 09/21/2017 09/06/17   Jonnie Kind, MD  potassium  chloride SA (K-DUR,KLOR-CON) 20 MEQ tablet Take 2 tablets (40 mEq total) by mouth 2 (two) times daily for 7 days. 09/09/17 09/16/17  Kinnie Feil, PA-C  promethazine (PHENERGAN) 25 MG suppository Place 1 suppository (25 mg total) rectally every 8 (eight) hours for 7 days. Patient not taking: Reported on 09/21/2017 09/14/17 09/21/17  Romie Jumper, PA-C    Family History No family history on file.  Social History Social History   Tobacco Use  . Smoking status: Former Smoker    Packs/day: 0.50  . Smokeless tobacco: Never Used  Substance Use Topics  . Alcohol use: No  . Drug use: Yes    Types: Other-see comments, Marijuana    Comment: Occasionally smokes marijuana Reports last  time 09/11/17 but at party on 09/13/17     Allergies   Strawberry (diagnostic) and  Penicillins   Review of Systems Review of Systems  Constitutional: Negative for chills and fever.  HENT: Negative for congestion and rhinorrhea.   Eyes: Negative for redness and visual disturbance.  Respiratory: Negative for shortness of breath and wheezing.   Cardiovascular: Negative for chest pain and palpitations.  Gastrointestinal: Positive for abdominal pain, nausea and vomiting.  Genitourinary: Negative for dysuria and urgency.  Musculoskeletal: Negative for arthralgias and myalgias.  Skin: Negative for pallor and wound.  Neurological: Negative for dizziness and headaches.     Physical Exam Updated Vital Signs BP (!) 151/94 (BP Location: Left Arm)   Pulse 94   Temp 98.8 F (37.1 C) (Oral)   Resp 18   LMP 09/11/2017   SpO2 100%   Physical Exam  Constitutional: She is oriented to person, place, and time. She appears well-developed and well-nourished. No distress.  HENT:  Head: Normocephalic and atraumatic.  Eyes: Pupils are equal, round, and reactive to light. EOM are normal.  Neck: Normal range of motion. Neck supple.  Cardiovascular: Normal rate and regular rhythm. Exam reveals no gallop and no friction rub.  No murmur heard. Pulmonary/Chest: Effort normal. She has no wheezes. She has no rales.  Abdominal: Soft. She exhibits no distension and no mass. There is tenderness (mild diffuse). There is no guarding.  Musculoskeletal: She exhibits no edema or tenderness.  Neurological: She is alert and oriented to person, place, and time.  Skin: Skin is warm and dry. She is not diaphoretic.  Psychiatric: She has a normal mood and affect. Her behavior is normal.  Nursing note and vitals reviewed.    ED Treatments / Results  Labs (all labs ordered are listed, but only abnormal results are displayed) Labs Reviewed  CBC WITH DIFFERENTIAL/PLATELET - Abnormal; Notable for the following components:      Result Value   WBC 16.0 (*)    Hemoglobin 10.6 (*)    HCT 34.6 (*)     MCV 70.6 (*)    MCH 21.6 (*)    RDW 18.5 (*)    Neutro Abs 13.3 (*)    All other components within normal limits  COMPREHENSIVE METABOLIC PANEL - Abnormal; Notable for the following components:   Potassium 3.2 (*)    CO2 21 (*)    Glucose, Bld 128 (*)    Creatinine, Ser 1.03 (*)    All other components within normal limits  LIPASE, BLOOD  I-STAT BETA HCG BLOOD, ED ([facility], [facility], [facility] ONLY)    EKG EKG Interpretation  Date/Time:  Sunday September 21 2017 20:46:02 EDT Ventricular Rate:  76 PR Interval:    QRS Duration: 92 QT Interval:  398 QTC Calculation: 448 R Axis:   53 Text Interpretation:  Sinus rhythm Probable left atrial enlargement Confirmed by Noemi Chapel [id number]) on 09/24/2017 10:48:50 AM   Radiology No results found.  Procedures Procedures (including critical care time)  Medications Ordered in ED Medications  capsaicin (ZOSTRIX) 0.025 % cream ( Topical Refused 09/21/17 1622)  scopolamine (TRANSDERM-SCOP) 1 MG/3DAYS 1.5 mg (1.5 mg Transdermal Patch Applied 09/21/17 1901)  haloperidol lactate (HALDOL) injection 5 mg (5 mg Intravenous Given 09/21/17 1618)  sodium chloride 0.9 % bolus 1,000 mL (0 mLs Intravenous Stopped 09/21/17 1930)  prochlorperazine (COMPAZINE) injection 10 mg (10 mg Intravenous Given 09/21/17 1828)  diphenhydrAMINE (BENADRYL) injection 50 mg (50 mg Intravenous Given 09/21/17 1829)  famotidine (PEPCID) IVPB 20 mg premix (0 mg Intravenous Stopped 09/21/17 1858)  gi cocktail (Maalox,Lidocaine,Donnatal) (30 mLs Oral Given 09/21/17 1937)  metoCLOPramide (REGLAN) injection 10 mg (10 mg Intravenous Given 09/21/17 2039)     Initial Impression / Assessment and Plan / ED Course  I have reviewed the triage vital signs and the nursing notes.  Pertinent labs & imaging results that were available during my care of the patient were reviewed by me and considered in my medical decision making (see chart for details).  Clinical Course as of Sep 21 2104  Nancy Fetter Sep 21, 2017    1837 Patient was reassessed and was feeling better from the nausea standpoint but still felt like she had terrible abdominal pain.  As I was talking to her about doing an oral trial the patient suddenly stated that she felt she needed to vomit again.  We will give a dose of Compazine Pepcid and reassess.   [DF]    Clinical Course User Index [DF] Deno Etienne, DO    42 yo F with a chief complaint of nausea and vomiting.  This is the patient's 11th visit to a emergency care facility in the past month and a half.  She is presumed to have cannabinoid hyperemesis syndrome.  She had a recent endoscopy 5 days ago that was negative for any gastric pathology.  Actively vomiting on my initial exam.  Mild diffuse tenderness.  We will give fluids Haldol capsaicin cream reassess.   The patient is feeling much better after multiple rounds of antiemetics.  She is requesting to go home.  She is been able to tolerate p.o. well in the ED.  We will have her follow-up with her gastroneurologist.  9:06 PM:  I have discussed the diagnosis/risks/treatment options with the patient and family and believe the pt to be eligible for discharge home to follow-up with PCP. We also discussed returning to the ED immediately if new or worsening sx occur. We discussed the sx which are most concerning (e.g., sudden worsening pain, fever, inability to tolerate by mouth ) that necessitate immediate return. Medications administered to the patient during their visit and any new prescriptions provided to the patient are listed below.  Medications given during this visit Medications  capsaicin (ZOSTRIX) 0.025 % cream ( Topical Refused 09/21/17 1622)  scopolamine (TRANSDERM-SCOP) 1 MG/3DAYS 1.5 mg (1.5 mg Transdermal Patch Applied 09/21/17 1901)  haloperidol lactate (HALDOL) injection 5 mg (5 mg Intravenous Given 09/21/17 1618)  sodium chloride 0.9 % bolus 1,000 mL (0 mLs Intravenous Stopped 09/21/17 1930)  prochlorperazine (COMPAZINE)  injection 10 mg (10 mg Intravenous Given 09/21/17 1828)  diphenhydrAMINE (BENADRYL) injection 50 mg (50 mg Intravenous Given 09/21/17 1829)  famotidine (PEPCID) IVPB 20 mg premix (0 mg Intravenous Stopped 09/21/17 1858)  gi cocktail (Maalox,Lidocaine,Donnatal) (30 mLs Oral Given 09/21/17 1937)  metoCLOPramide (REGLAN) injection 10 mg (10 mg Intravenous Given 09/21/17 2039)      The patient appears reasonably screen and/or stabilized for discharge and I doubt any other medical condition or other [facility] requiring further screening, evaluation, or treatment in the ED at this time prior to discharge.    Final Clinical Impressions(s) / ED Diagnoses   Final diagnoses:  Nausea and vomiting in adult    ED Discharge Orders    None       Deno Etienne, DO 09/21/17 2106    Deno Etienne, DO 09/30/17 1642

## 2017-09-21 NOTE — MAU Note (Signed)
Pt reports they told her she had a hernia when she had her endoscopy. Pt states she has vomiting and diarrhea and daughter had vomiting about 2 days ago. Pt feels dizzy.

## 2017-09-21 NOTE — Discharge Instructions (Signed)
Please follow-up with your family physician.  Even if you do not think that marijuana has caused you to have this problem please try to refrain from it.  Please follow-up with your gastroenterologist.  If you are unable to keep anything down please return to the emergency department.  They had prescribed you Zofran and Phenergan previously.  You can certainly try these prior to returning.

## 2017-09-23 ENCOUNTER — Other Ambulatory Visit (HOSPITAL_COMMUNITY): Payer: Self-pay | Admitting: Gastroenterology

## 2017-09-23 DIAGNOSIS — R109 Unspecified abdominal pain: Secondary | ICD-10-CM

## 2017-09-23 DIAGNOSIS — R1013 Epigastric pain: Secondary | ICD-10-CM | POA: Diagnosis not present

## 2017-09-23 DIAGNOSIS — R112 Nausea with vomiting, unspecified: Secondary | ICD-10-CM

## 2017-09-23 DIAGNOSIS — Z90721 Acquired absence of ovaries, unilateral: Secondary | ICD-10-CM | POA: Diagnosis not present

## 2017-09-24 ENCOUNTER — Emergency Department (HOSPITAL_COMMUNITY)
Admission: EM | Admit: 2017-09-24 | Discharge: 2017-09-24 | Discharge status: Absent without leave | Payer: Medicare Other

## 2017-09-24 NOTE — ED Notes (Signed)
Called for triage x1.

## 2017-09-25 ENCOUNTER — Encounter (HOSPITAL_COMMUNITY): Payer: Self-pay

## 2017-09-25 ENCOUNTER — Emergency Department (HOSPITAL_COMMUNITY)
Admission: EM | Admit: 2017-09-25 | Discharge: 2017-09-25 | Disposition: A | Discharge status: Home / Self Care | Payer: Medicare Other | Source: Home / Self Care | Attending: Emergency Medicine | Admitting: Emergency Medicine

## 2017-09-25 ENCOUNTER — Other Ambulatory Visit: Payer: Self-pay

## 2017-09-25 DIAGNOSIS — Z87891 Personal history of nicotine dependence: Secondary | ICD-10-CM

## 2017-09-25 DIAGNOSIS — R110 Nausea: Secondary | ICD-10-CM | POA: Diagnosis not present

## 2017-09-25 DIAGNOSIS — Z79899 Other long term (current) drug therapy: Secondary | ICD-10-CM

## 2017-09-25 DIAGNOSIS — R1013 Epigastric pain: Secondary | ICD-10-CM | POA: Insufficient documentation

## 2017-09-25 DIAGNOSIS — R1114 Bilious vomiting: Secondary | ICD-10-CM

## 2017-09-25 DIAGNOSIS — R112 Nausea with vomiting, unspecified: Secondary | ICD-10-CM

## 2017-09-25 DIAGNOSIS — R740 Nonspecific elevation of levels of transaminase and lactic acid dehydrogenase [LDH]: Secondary | ICD-10-CM | POA: Diagnosis not present

## 2017-09-25 DIAGNOSIS — K811 Chronic cholecystitis: Secondary | ICD-10-CM | POA: Diagnosis not present

## 2017-09-25 LAB — CBC WITH DIFFERENTIAL/PLATELET
BASOS ABS: 0.1 10*3/uL (ref 0.0–0.1)
BASOS PCT: 0 %
EOS ABS: 0.1 10*3/uL (ref 0.0–0.7)
EOS PCT: 1 %
HCT: 38.8 % (ref 36.0–46.0)
Hemoglobin: 11.9 g/dL — ABNORMAL LOW (ref 12.0–15.0)
LYMPHS PCT: 25 %
Lymphs Abs: 4.2 10*3/uL — ABNORMAL HIGH (ref 0.7–4.0)
MCH: 21.6 pg — ABNORMAL LOW (ref 26.0–34.0)
MCHC: 30.7 g/dL (ref 30.0–36.0)
MCV: 70.5 fL — ABNORMAL LOW (ref 78.0–100.0)
MONO ABS: 1 10*3/uL (ref 0.1–1.0)
Monocytes Relative: 6 %
Neutro Abs: 11.9 10*3/uL — ABNORMAL HIGH (ref 1.7–7.7)
Neutrophils Relative %: 68 %
PLATELETS: 370 10*3/uL (ref 150–400)
RBC: 5.5 MIL/uL — AB (ref 3.87–5.11)
RDW: 18.1 % — AB (ref 11.5–15.5)
WBC: 17.3 10*3/uL — AB (ref 4.0–10.5)

## 2017-09-25 LAB — BASIC METABOLIC PANEL
Anion gap: 12 (ref 5–15)
BUN: 12 mg/dL (ref 6–20)
CALCIUM: 9.7 mg/dL (ref 8.9–10.3)
CO2: 29 mmol/L (ref 22–32)
CREATININE: 1.28 mg/dL — AB (ref 0.44–1.00)
Chloride: 97 mmol/L — ABNORMAL LOW (ref 98–111)
GFR calc Af Amer: 59 mL/min — ABNORMAL LOW (ref >60)
GFR, EST NON AFRICAN AMERICAN: 51 mL/min — AB (ref >60)
GLUCOSE: 125 mg/dL — AB (ref 70–99)
POTASSIUM: 3.2 mmol/L — AB (ref 3.5–5.1)
SODIUM: 138 mmol/L (ref 135–145)

## 2017-09-25 MED ORDER — PROMETHAZINE HCL 25 MG PO TABS: 25.0000 mg | ORAL_TABLET | Freq: Four times a day (QID) | ORAL | 0 refills | Status: DC | PRN

## 2017-09-25 MED ORDER — SODIUM CHLORIDE 0.9 % IV BOLUS
1000.0000 mL | Freq: Once | INTRAVENOUS | Status: AC
  Administered 2017-09-25: 1000 mL via INTRAVENOUS

## 2017-09-25 MED ORDER — MORPHINE SULFATE (PF) 4 MG/ML IV SOLN
4.0000 mg | Freq: Once | INTRAVENOUS | Status: AC
  Administered 2017-09-25: 4 mg via INTRAVENOUS
  Filled 2017-09-25: qty 1

## 2017-09-25 MED ORDER — PROCHLORPERAZINE EDISYLATE 10 MG/2ML IJ SOLN
10.0000 mg | Freq: Once | INTRAMUSCULAR | Status: AC
  Administered 2017-09-25: 10 mg via INTRAVENOUS
  Filled 2017-09-25: qty 2

## 2017-09-25 NOTE — ED Notes (Signed)
Attempted to draw back off of pts IV. Unable to draw back.

## 2017-09-25 NOTE — ED Triage Notes (Signed)
EMS reports from home, c/o severe abdominal pain and vomiting since Sunday, Saw GI Dr. Oletta Lamas Tuesday with DX of Hiatal Hernia. Confirmed Hernia with endoscopy, Pt unable to keep down food. This morning had syncopal episode, unknown fall, no obvious injuries, denies additional pain.   BP 127/82 HR 98 RR 18 Sp02 100 RA CBG 146  20 L hand

## 2017-09-25 NOTE — Discharge Instructions (Signed)
As discussed, your evaluation today has been largely reassuring.  But, it is important that you monitor your condition carefully, and do not hesitate to return to the ED if you develop new, or concerning changes in your condition. ? ?Otherwise, please follow-up with your physician for appropriate ongoing care. ? ?

## 2017-09-25 NOTE — ED Notes (Signed)
ED Provider at bedside. 

## 2017-09-25 NOTE — ED Notes (Signed)
Bed: W[facility]42 Expected date: 09/25/17 Expected time: 8:01 AM Means of arrival: Ambulance Comments: EMS-fall

## 2017-09-25 NOTE — ED Provider Notes (Signed)
[facility] DEPT Provider Note   CSN: [id number] Arrival date & time: 09/25/17  0801     History   Chief Complaint Chief Complaint  Patient presents with  . Abdominal Pain  . Emesis  . Loss of Consciousness    HPI Haley Freeman is a 42 y.o. female.  HPI Patient presents for the second time in 1 week, seventh time in 6 months, now with concern for abdominal pain, p.o. intolerance. She notes a history of recurrent nausea, vomiting. Since endoscopy 1 week ago she notes that she has been unable to tolerate any oral intake. With variable timing she notes that after eating or drinking she subsequently developed epigastric pain, has episodes of vomiting. She has lost approximately 20 pounds over the past week. She has diminished bowel movements and urination during this timeframe, no fever, no chills. No relief with any medication, and she is intolerant of oral meds.  Past Medical History:  Diagnosis Date  . Alcohol abuse   . Anxiety   . Depression   . Headache(784.0)   . Neuromuscular disorder [facility])     Patient Active Problem List   Diagnosis Date Noted  . Cannabis hyperemesis syndrome concurrent with and due to cannabis abuse ([place]) 09/16/2017  . Nausea and vomiting 09/06/2017  . Dermoid 08/22/2017    Past Surgical History:  Procedure Laterality Date  . CESAREAN SECTION  '99 and '08   x 2  . ESOPHAGOGASTRODUODENOSCOPY N/A 09/16/2017   Procedure: ESOPHAGOGASTRODUODENOSCOPY (EGD) WITH PROPOFOL;  Surgeon: Clarene Essex, MD;  Location: [facility];  Service: Endoscopy;  Laterality: N/A;  . LAPAROSCOPY Right 08/22/2017   Procedure: LAPAROSCOPY OPERATIVE removal of right ovary and right dermoid cyst;  Surgeon: Sloan Leiter, MD;  Location: [facility];  Service: Gynecology;  Laterality: Right;     OB History    Gravida  3   Para  3   Term      Preterm      AB      Living  3     SAB      TAB      Ectopic      Multiple        Live Births               Home Medications    Prior to Admission medications   Medication Sig Start Date End Date Taking? Authorizing Provider  dicyclomine (BENTYL) 20 MG tablet Take 1 tablet (20 mg total) by mouth 4 (four) times daily -  before meals and at bedtime for 14 days. 09/14/17 09/28/17 Yes Mortis, Alvie Heidelberg I, PA-C  hydrOXYzine (ATARAX/VISTARIL) 25 MG tablet Take 50 mg by mouth at bedtime as needed for nausea or vomiting.  09/20/17  Yes [provider]  metoCLOPramide (REGLAN) 5 MG tablet Take 5 mg by mouth 3 (three) times daily before meals. 09/16/17  Yes [provider]  omeprazole (PRILOSEC) 20 MG capsule Take 2 capsules (40 mg total) by mouth daily. 09/09/17 10/09/17 Yes Carmon Sails J, PA-C  ondansetron (ZOFRAN ODT) 4 MG disintegrating tablet Take 1 tablet (4 mg total) by mouth every 8 (eight) hours as needed. Patient taking differently: Take 4 mg by mouth every 8 (eight) hours as needed for nausea or vomiting.  09/13/17  Yes Isla Pence, MD  pantoprazole (PROTONIX) 40 MG tablet Take 1 tablet (40 mg total) by mouth 2 (two) times daily. 09/13/17  Yes Isla Pence, MD  ranitidine (ZANTAC) 300 MG tablet Take  1 tablet (300 mg total) by mouth at bedtime. 09/09/17 10/09/17 Yes Kinnie Feil, PA-C  traZODone (DESYREL) 100 MG tablet Take 300 mg by mouth at bedtime.    Yes [provider]  oxyCODONE-acetaminophen (PERCOCET/ROXICET) 5-325 MG tablet Take 1-2 tablets by mouth every 6 (six) hours as needed. Patient not taking: Reported on 09/21/2017 09/06/17   Jonnie Kind, MD    Family History History reviewed. No pertinent family history.  Social History Social History   Tobacco Use  . Smoking status: Former Smoker    Packs/day: 0.50  . Smokeless tobacco: Never Used  Substance Use Topics  . Alcohol use: No  . Drug use: Yes    Types: Other-see comments, Marijuana    Comment: Occasionally smokes marijuana Reports last  time 09/11/17 but at  party on 09/13/17     Allergies   Strawberry (diagnostic) and Penicillins   Review of Systems Review of Systems  Constitutional:       Per HPI, otherwise negative  HENT:       Per HPI, otherwise negative  Respiratory:       Per HPI, otherwise negative  Cardiovascular:       Per HPI, otherwise negative  Gastrointestinal: Positive for abdominal pain, nausea and vomiting.  Endocrine:       Negative aside from HPI  Genitourinary:       Neg aside from HPI   Musculoskeletal:       Per HPI, otherwise negative  Skin: Negative.   Neurological: Negative for syncope.     Physical Exam Updated Vital Signs BP (!) 141/93   Pulse 82   Temp 98.1 F (36.7 C) (Oral)   Resp 18   LMP 09/11/2017   SpO2 97%   Physical Exam  Constitutional: She is oriented to person, place, and time. She appears well-developed and well-nourished. No distress.  HENT:  Head: Normocephalic and atraumatic.  Eyes: Conjunctivae and EOM are normal.  Cardiovascular: Normal rate and regular rhythm.  Pulmonary/Chest: Effort normal and breath sounds normal. No stridor. No respiratory distress.  Abdominal: She exhibits no distension. There is tenderness in the epigastric area. There is guarding.  Musculoskeletal: She exhibits no edema.  Neurological: She is alert and oriented to person, place, and time. No cranial nerve deficit.  Skin: Skin is warm and dry.  Psychiatric: She has a normal mood and affect.  Nursing note and vitals reviewed.    ED Treatments / Results  Labs (all labs ordered are listed, but only abnormal results are displayed) Labs Reviewed  BASIC METABOLIC PANEL - Abnormal; Notable for the following components:      Result Value   Potassium 3.2 (*)    Chloride 97 (*)    Glucose, Bld 125 (*)    Creatinine, Ser 1.28 (*)    GFR calc non Af Amer 51 (*)    GFR calc Af Amer 59 (*)    All other components within normal limits  CBC WITH DIFFERENTIAL/PLATELET - Abnormal; Notable for the  following components:   WBC 17.3 (*)    RBC 5.50 (*)    Hemoglobin 11.9 (*)    MCV 70.5 (*)    MCH 21.6 (*)    RDW 18.1 (*)    Neutro Abs 11.9 (*)    Lymphs Abs 4.2 (*)    All other components within normal limits    EKG None  Radiology No results found.  Procedures Procedures (including critical care time)  Medications Ordered in ED  Medications  sodium chloride 0.9 % bolus 1,000 mL (0 mLs Intravenous Stopped 09/25/17 0955)  prochlorperazine (COMPAZINE) injection 10 mg (10 mg Intravenous Given 09/25/17 0855)  morphine 4 MG/ML injection 4 mg (4 mg Intravenous Given 09/25/17 0948)  sodium chloride 0.9 % bolus 1,000 mL (1,000 mLs Intravenous New Bag/Given 09/25/17 1030)     Initial Impression / Assessment and Plan / ED Course  I have reviewed the triage vital signs and the nursing notes.  Pertinent labs & imaging results that were available during my care of the patient were reviewed by me and considered in my medical decision making (see chart for details).    After the initial evaluation notable for multiple ED visits, including one earlier this week for similar concern. Patient also had endoscopy on the 10th of this month, results below:  - Normal larynx. - Medium-sized hiatal hernia. - Normal stomach. - Erythematous duodenopathy. - Normal second portion of the duodenum, third portion of the duodenum and fourth portion of the duodenum. - The examination was otherwise normal.   11:35 AM I discussed patient's case with her gastroenterology team. Now on repeat exam after fluid resuscitation, Compazine, she feels better.  We discussed need to follow-up with GI clinic given this improvement. We discussed appropriate home medication use, and with generally reassuring findings, aside from mild evidence for dehydration, substantial improvement here she was discharged to follow-up in the clinic.   Final Clinical Impressions(s) / ED Diagnoses  Nausea and vomiting Epigastric  pain   Carmin Muskrat, MD 09/25/17 1136

## 2017-09-26 ENCOUNTER — Encounter (HOSPITAL_COMMUNITY): Payer: Self-pay

## 2017-09-26 ENCOUNTER — Observation Stay (HOSPITAL_COMMUNITY): Payer: Medicare Other

## 2017-09-26 ENCOUNTER — Other Ambulatory Visit: Payer: Self-pay

## 2017-09-26 ENCOUNTER — Inpatient Hospital Stay (HOSPITAL_COMMUNITY)
Admission: EM | Admit: 2017-09-26 | Discharge: 2017-10-02 | DRG: 418 | Disposition: A | Discharge status: Home / Self Care | Payer: Medicare Other | Attending: Internal Medicine | Admitting: Internal Medicine

## 2017-09-26 DIAGNOSIS — R740 Nonspecific elevation of levels of transaminase and lactic acid dehydrogenase [LDH]: Secondary | ICD-10-CM | POA: Diagnosis not present

## 2017-09-26 DIAGNOSIS — F418 Other specified anxiety disorders: Secondary | ICD-10-CM | POA: Diagnosis present

## 2017-09-26 DIAGNOSIS — K219 Gastro-esophageal reflux disease without esophagitis: Secondary | ICD-10-CM | POA: Diagnosis present

## 2017-09-26 DIAGNOSIS — Z79899 Other long term (current) drug therapy: Secondary | ICD-10-CM

## 2017-09-26 DIAGNOSIS — Z90722 Acquired absence of ovaries, bilateral: Secondary | ICD-10-CM

## 2017-09-26 DIAGNOSIS — Z880 Allergy status to penicillin: Secondary | ICD-10-CM

## 2017-09-26 DIAGNOSIS — K828 Other specified diseases of gallbladder: Secondary | ICD-10-CM | POA: Diagnosis not present

## 2017-09-26 DIAGNOSIS — R945 Abnormal results of liver function studies: Secondary | ICD-10-CM

## 2017-09-26 DIAGNOSIS — R112 Nausea with vomiting, unspecified: Secondary | ICD-10-CM | POA: Diagnosis not present

## 2017-09-26 DIAGNOSIS — F1210 Cannabis abuse, uncomplicated: Secondary | ICD-10-CM

## 2017-09-26 DIAGNOSIS — F1010 Alcohol abuse, uncomplicated: Secondary | ICD-10-CM | POA: Diagnosis present

## 2017-09-26 DIAGNOSIS — K449 Diaphragmatic hernia without obstruction or gangrene: Secondary | ICD-10-CM | POA: Diagnosis present

## 2017-09-26 DIAGNOSIS — E860 Dehydration: Secondary | ICD-10-CM | POA: Diagnosis present

## 2017-09-26 DIAGNOSIS — E876 Hypokalemia: Secondary | ICD-10-CM

## 2017-09-26 DIAGNOSIS — Z91018 Allergy to other foods: Secondary | ICD-10-CM

## 2017-09-26 DIAGNOSIS — K811 Chronic cholecystitis: Principal | ICD-10-CM | POA: Diagnosis present

## 2017-09-26 DIAGNOSIS — Z7151 Drug abuse counseling and surveillance of drug abuser: Secondary | ICD-10-CM

## 2017-09-26 DIAGNOSIS — D638 Anemia in other chronic diseases classified elsewhere: Secondary | ICD-10-CM | POA: Diagnosis present

## 2017-09-26 DIAGNOSIS — Z6841 Body Mass Index (BMI) 40.0 and over, adult: Secondary | ICD-10-CM

## 2017-09-26 DIAGNOSIS — R7401 Elevation of levels of liver transaminase levels: Secondary | ICD-10-CM

## 2017-09-26 DIAGNOSIS — R948 Abnormal results of function studies of other organs and systems: Secondary | ICD-10-CM

## 2017-09-26 DIAGNOSIS — Z87891 Personal history of nicotine dependence: Secondary | ICD-10-CM

## 2017-09-26 DIAGNOSIS — F419 Anxiety disorder, unspecified: Secondary | ICD-10-CM | POA: Diagnosis present

## 2017-09-26 DIAGNOSIS — F329 Major depressive disorder, single episode, unspecified: Secondary | ICD-10-CM | POA: Diagnosis present

## 2017-09-26 DIAGNOSIS — R7989 Other specified abnormal findings of blood chemistry: Secondary | ICD-10-CM

## 2017-09-26 LAB — COMPREHENSIVE METABOLIC PANEL
ALBUMIN: 4 g/dL (ref 3.5–5.0)
ALT: 122 U/L — ABNORMAL HIGH (ref 0–44)
AST: 90 U/L — AB (ref 15–41)
Alkaline Phosphatase: 107 U/L (ref 38–126)
Anion gap: 11 (ref 5–15)
BUN: 10 mg/dL (ref 6–20)
CALCIUM: 9.5 mg/dL (ref 8.9–10.3)
CO2: 26 mmol/L (ref 22–32)
Chloride: 102 mmol/L (ref 98–111)
Creatinine, Ser: 1.29 mg/dL — ABNORMAL HIGH (ref 0.44–1.00)
GFR calc non Af Amer: 50 mL/min — ABNORMAL LOW (ref >60)
GFR, EST AFRICAN AMERICAN: 58 mL/min — AB (ref >60)
Glucose, Bld: 113 mg/dL — ABNORMAL HIGH (ref 70–99)
POTASSIUM: 3.4 mmol/L — AB (ref 3.5–5.1)
Sodium: 139 mmol/L (ref 135–145)
Total Bilirubin: 0.6 mg/dL (ref 0.3–1.2)
Total Protein: 7.8 g/dL (ref 6.5–8.1)

## 2017-09-26 LAB — URINALYSIS, ROUTINE W REFLEX MICROSCOPIC
BILIRUBIN URINE: NEGATIVE
Glucose, UA: NEGATIVE mg/dL
KETONES UR: NEGATIVE mg/dL
Nitrite: NEGATIVE
Protein, ur: 100 mg/dL — AB
SPECIFIC GRAVITY, URINE: 1.016 (ref 1.005–1.030)
WBC, UA: >50 WBC/hpf — ABNORMAL HIGH (ref 0–5)
pH: 6 (ref 5.0–8.0)

## 2017-09-26 LAB — CBC
HCT: 35 % — ABNORMAL LOW (ref 36.0–46.0)
Hemoglobin: 10.7 g/dL — ABNORMAL LOW (ref 12.0–15.0)
MCH: 21.5 pg — AB (ref 26.0–34.0)
MCHC: 30.6 g/dL (ref 30.0–36.0)
MCV: 70.3 fL — AB (ref 78.0–100.0)
Platelets: 343 10*3/uL (ref 150–400)
RBC: 4.98 MIL/uL (ref 3.87–5.11)
RDW: 18.2 % — ABNORMAL HIGH (ref 11.5–15.5)
WBC: 16.2 10*3/uL — ABNORMAL HIGH (ref 4.0–10.5)

## 2017-09-26 LAB — RAPID URINE DRUG SCREEN, HOSP PERFORMED
Amphetamines: POSITIVE — AB
Barbiturates: POSITIVE — AB
Benzodiazepines: NOT DETECTED
Cocaine: NOT DETECTED
Opiates: POSITIVE — AB
Tetrahydrocannabinol: POSITIVE — AB

## 2017-09-26 LAB — LIPASE, BLOOD: LIPASE: 28 U/L (ref 11–51)

## 2017-09-26 LAB — HCG, QUANTITATIVE, PREGNANCY: hCG, Beta Chain, Quant, S: <1 m[IU]/mL

## 2017-09-26 LAB — I-STAT BETA HCG BLOOD, ED (MC, WL, AP ONLY): HCG, QUANTITATIVE: 5.2 m[IU]/mL — AB (ref <5)

## 2017-09-26 MED ORDER — METOCLOPRAMIDE HCL 5 MG/ML IJ SOLN
5.0000 mg | Freq: Three times a day (TID) | INTRAMUSCULAR | Status: DC
  Administered 2017-09-26 – 2017-10-02 (×18): 5 mg via INTRAVENOUS
  Filled 2017-09-26 (×18): qty 2

## 2017-09-26 MED ORDER — SODIUM CHLORIDE 0.9 % IV SOLN
Freq: Once | INTRAVENOUS | Status: AC
  Administered 2017-09-26: 12:00:00 via INTRAVENOUS

## 2017-09-26 MED ORDER — DIPHENHYDRAMINE HCL 50 MG/ML IJ SOLN
25.0000 mg | Freq: Once | INTRAMUSCULAR | Status: AC
  Administered 2017-09-26: 25 mg via INTRAVENOUS
  Filled 2017-09-26: qty 1

## 2017-09-26 MED ORDER — MORPHINE SULFATE (PF) 2 MG/ML IV SOLN
2.0000 mg | INTRAVENOUS | Status: DC | PRN
  Administered 2017-09-26 – 2017-09-28 (×9): 2 mg via INTRAVENOUS
  Filled 2017-09-26 (×9): qty 1

## 2017-09-26 MED ORDER — TRAZODONE HCL 100 MG PO TABS
300.0000 mg | ORAL_TABLET | Freq: Every day | ORAL | Status: DC
  Administered 2017-09-26 – 2017-10-01 (×6): 300 mg via ORAL
  Filled 2017-09-26 (×6): qty 3

## 2017-09-26 MED ORDER — ENOXAPARIN SODIUM 40 MG/0.4ML ~~LOC~~ SOLN
40.0000 mg | SUBCUTANEOUS | Status: DC
  Administered 2017-09-26 – 2017-09-29 (×4): 40 mg via SUBCUTANEOUS
  Filled 2017-09-26 (×4): qty 0.4

## 2017-09-26 MED ORDER — PANTOPRAZOLE SODIUM 40 MG PO TBEC
40.0000 mg | DELAYED_RELEASE_TABLET | Freq: Every day | ORAL | Status: DC
  Administered 2017-09-26 – 2017-10-02 (×5): 40 mg via ORAL
  Filled 2017-09-26 (×5): qty 1

## 2017-09-26 MED ORDER — TECHNETIUM TC 99M MEBROFENIN IV KIT
5.3000 | PACK | Freq: Once | INTRAVENOUS | Status: AC | PRN
  Administered 2017-09-26: 5.3 via INTRAVENOUS

## 2017-09-26 MED ORDER — METOCLOPRAMIDE HCL 5 MG/ML IJ SOLN
10.0000 mg | Freq: Once | INTRAMUSCULAR | Status: AC
  Administered 2017-09-26: 10 mg via INTRAVENOUS
  Filled 2017-09-26: qty 2

## 2017-09-26 MED ORDER — POTASSIUM CHLORIDE CRYS ER 20 MEQ PO TBCR
40.0000 meq | EXTENDED_RELEASE_TABLET | Freq: Once | ORAL | Status: AC
  Administered 2017-09-26: 40 meq via ORAL
  Filled 2017-09-26: qty 2

## 2017-09-26 MED ORDER — SODIUM CHLORIDE 0.9 % IV SOLN
INTRAVENOUS | Status: AC
  Administered 2017-09-26 – 2017-09-27 (×3): via INTRAVENOUS

## 2017-09-26 MED ORDER — SODIUM CHLORIDE 0.9 % IV BOLUS
1000.0000 mL | Freq: Once | INTRAVENOUS | Status: AC
  Administered 2017-09-26: 1000 mL via INTRAVENOUS

## 2017-09-26 MED ORDER — PROMETHAZINE HCL 25 MG/ML IJ SOLN
6.2500 mg | Freq: Four times a day (QID) | INTRAMUSCULAR | Status: DC | PRN
  Administered 2017-09-27 – 2017-10-02 (×6): 6.25 mg via INTRAVENOUS
  Filled 2017-09-26 (×6): qty 1

## 2017-09-26 NOTE — H&P (Signed)
History and Physical    Haley Freeman RXV:[id number] DOB: 19-Sep-1975 DOA: 09/26/2017  I have briefly reviewed the patient's prior medical records in [facility]  PCP: [facility], [facility]  Patient coming from: Home  Chief Complaint: Intractable nausea and vomiting  HPI: Haley Freeman is a 42 y.o. female with medical history significant of prior history of alcohol abuse but nothing recently, marijuana use, anxiety, presents to the hospital with intractable nausea and vomiting.  Patient tells me that this has been going on for the past month.  She was evaluated several times in the emergency room, and also evaluated by gastroenterology as an outpatient.  She never required admission.  She tells me that over the past month, her symptoms have progressed, and now she is barely able to eat and drink anything.  She underwent an upper endoscopy about 10 days ago which was relatively unremarkable.  She comes today and tells me that she has not been able to eat and drink anything in several days.  She states that she has lost about 20 pounds in the last month.  She denies any fevers or chills, she denies any blood in her emesis but did see some red speckles, she denies any blood in her stool.  She denies diarrhea.  She also has been complaining of abdominal pain which she is midepigastric, sometimes radiates into her back.  ED Course: In the emergency room she is afebrile, vital signs are stable, blood work revealed mild hypokalemia and a slightly elevated creatinine to 1.29.  It also showed elevated liver enzymes with ALT of 122 and AST 90, which is new from her prior labs.  She has a leukocytosis with a white count of 16.  She is mildly anemic at 10.7.  UA shows rare bacteria.  Stat hCG was 5.2, however patient is quite clear that she cannot be pregnant  Review of Systems: As per HPI otherwise 10 point review of systems negative.   Past Medical History:  Diagnosis Date  . Alcohol  abuse   . Anxiety   . Depression   . Headache(784.0)   . Neuromuscular disorder [facility])     Past Surgical History:  Procedure Laterality Date  . CESAREAN SECTION  '99 and '08   x 2  . ESOPHAGOGASTRODUODENOSCOPY N/A 09/16/2017   Procedure: ESOPHAGOGASTRODUODENOSCOPY (EGD) WITH PROPOFOL;  Surgeon: Clarene Essex, MD;  Location: [facility];  Service: Endoscopy;  Laterality: N/A;  . LAPAROSCOPY Right 08/22/2017   Procedure: LAPAROSCOPY OPERATIVE removal of right ovary and right dermoid cyst;  Surgeon: Sloan Leiter, MD;  Location: [facility];  Service: Gynecology;  Laterality: Right;     reports that she has quit smoking. She smoked 0.50 packs per day. She has never used smokeless tobacco. She reports that she has current or past drug history. Drugs: Other-see comments and Marijuana. She reports that she does not drink alcohol.  Allergies  Allergen Reactions  . Strawberry (Diagnostic) Shortness Of Breath  . Penicillins Swelling    Swells throat. Has patient had a PCN reaction causing immediate rash, facial/tongue/throat swelling, SOB or lightheadedness with hypotension: Yes Has patient had a PCN reaction causing severe rash involving mucus membranes or skin necrosis: No Has patient had a PCN reaction that required hospitalization: Yes Has patient had a PCN reaction occurring within the last 10 years: No If all of the above answers are "NO", then may proceed with Cephalosporin use.     History reviewed. No pertinent family history.  Prior to Admission medications   Medication Sig Start Date End Date Taking? Authorizing Provider  dicyclomine (BENTYL) 20 MG tablet Take 1 tablet (20 mg total) by mouth 4 (four) times daily -  before meals and at bedtime for 14 days. 09/14/17 09/28/17 Yes Mortis, Alvie Heidelberg I, PA-C  hydrOXYzine (ATARAX/VISTARIL) 25 MG tablet Take 50 mg by mouth at bedtime as needed for nausea or vomiting.  09/20/17  Yes [provider]  metoCLOPramide (REGLAN) 5 MG tablet Take  5 mg by mouth 3 (three) times daily before meals. 09/16/17  Yes [provider]  omeprazole (PRILOSEC) 20 MG capsule Take 2 capsules (40 mg total) by mouth daily. 09/09/17 10/09/17 Yes Carmon Sails J, PA-C  ondansetron (ZOFRAN ODT) 4 MG disintegrating tablet Take 1 tablet (4 mg total) by mouth every 8 (eight) hours as needed. Patient taking differently: Take 4 mg by mouth every 8 (eight) hours as needed for nausea or vomiting.  09/13/17  Yes Isla Pence, MD  pantoprazole (PROTONIX) 40 MG tablet Take 1 tablet (40 mg total) by mouth 2 (two) times daily. 09/13/17  Yes Isla Pence, MD  promethazine (PHENERGAN) 25 MG tablet Take 1 tablet (25 mg total) by mouth every 6 (six) hours as needed for nausea or vomiting. 09/25/17  Yes Carmin Muskrat, MD  ranitidine (ZANTAC) 300 MG tablet Take 1 tablet (300 mg total) by mouth at bedtime. 09/09/17 10/09/17 Yes Kinnie Feil, PA-C  traZODone (DESYREL) 100 MG tablet Take 300 mg by mouth at bedtime.    Yes [provider]  oxyCODONE-acetaminophen (PERCOCET/ROXICET) 5-325 MG tablet Take 1-2 tablets by mouth every 6 (six) hours as needed. Patient not taking: Reported on 09/21/2017 09/06/17   Jonnie Kind, MD    Physical Exam: Vitals:   09/26/17 1030 09/26/17 1100 09/26/17 1133 09/26/17 1200  BP: 138/84 (!) 155/57 128/77 127/74  Pulse: 96 83 88 91  Resp: 17 18 19  (!) 21  Temp:      TempSrc:      SpO2: 100% 100% 100% 100%  Weight:      Height:        Constitutional: NAD, calm, comfortable Eyes: PERRL, lids and conjunctivae normal ENMT: Mucous membranes are dry Neck: normal, supple Respiratory: clear to auscultation bilaterally, no wheezing, no crackles. Normal respiratory effort. No accessory muscle use.  Cardiovascular: Regular rate and rhythm, no murmurs / rubs / gallops. No extremity edema.  Abdomen: Mild tenderness to palpation of the left upper quadrant as well as epigastrium, significant tenderness with positive Murphy sign  on the right upper quadrant.  No guarding or rebound. Musculoskeletal: no clubbing / cyanosis.  Skin: no rashes, Neurologic: CN 2-12 grossly intact. Strength 5/5 in all 4.  Psychiatric: Normal judgment and insight. Alert and oriented x 3. Normal mood.   Labs on Admission: I have personally reviewed following labs and imaging studies  CBC: Recent Labs  Lab 09/21/17 1622 09/25/17 0909 09/26/17 1119  WBC 16.0* 17.3* 16.2*  NEUTROABS 13.3* 11.9*  --   HGB 10.6* 11.9* 10.7*  HCT 34.6* 38.8 35.0*  MCV 70.6* 70.5* 70.3*  PLT 352 370 341   Basic Metabolic Panel: Recent Labs  Lab 09/21/17 1622 09/25/17 0909 09/26/17 1119  NA 141 138 139  K 3.2* 3.2* 3.4*  CL 108 97* 102  CO2 21* 29 26  GLUCOSE 128* 125* 113*  BUN 8 12 10   CREATININE 1.03* 1.28* 1.29*  CALCIUM 9.4 9.7 9.5   GFR: Estimated Creatinine Clearance: 82.4 mL/min (A) (  by C-G formula based on SCr of 1.29 mg/dL (H)). Liver Function Tests: Recent Labs  Lab 09/21/17 1622 09/26/17 1119  AST 22 90*  ALT 19 122*  ALKPHOS 96 107  BILITOT 0.4 0.6  PROT 7.6 7.8  ALBUMIN 3.6 4.0   Recent Labs  Lab 09/21/17 1622 09/26/17 1119  LIPASE 39 28   No results for input(s): AMMONIA in the last 168 hours. Coagulation Profile: No results for input(s): INR, PROTIME in the last 168 hours. Cardiac Enzymes: No results for input(s): CKTOTAL, CKMB, CKMBINDEX, TROPONINI in the last 168 hours. BNP (last 3 results) No results for input(s): PROBNP in the last 8760 hours. HbA1C: No results for input(s): HGBA1C in the last 72 hours. CBG: No results for input(s): GLUCAP in the last 168 hours. Lipid Profile: No results for input(s): CHOL, HDL, LDLCALC, TRIG, CHOLHDL, LDLDIRECT in the last 72 hours. Thyroid Function Tests: No results for input(s): TSH, T4TOTAL, FREET4, T3FREE, THYROIDAB in the last 72 hours. Anemia Panel: No results for input(s): VITAMINB12, FOLATE, FERRITIN, TIBC, IRON, RETICCTPCT in the last 72 hours. Urine  analysis:    Component Value Date/Time   COLORURINE YELLOW 09/26/2017 1021   APPEARANCEUR CLOUDY (A) 09/26/2017 1021   LABSPEC 1.016 09/26/2017 1021   PHURINE 6.0 09/26/2017 1021   GLUCOSEU NEGATIVE 09/26/2017 1021   HGBUR SMALL (A) 09/26/2017 1021   BILIRUBINUR NEGATIVE 09/26/2017 1021   KETONESUR NEGATIVE 09/26/2017 1021   PROTEINUR 100 (A) 09/26/2017 1021   UROBILINOGEN 0.2 09/22/2014 0730   NITRITE NEGATIVE 09/26/2017 1021   LEUKOCYTESUR LARGE (A) 09/26/2017 1021     Radiological Exams on Admission: No results found.   Assessment/Plan Active Problems:   Intractable nausea and vomiting    Intractable nausea and vomiting -This apparently has been ongoing for a month, if tolerated by gastroneurology as an outpatient.  EDP did discuss with the on-call gastroenterology Dr. Paulita Fujita who does not think he has much to offer at this point. -Her previous right upper car ultrasound was negative for gallstones, however she did have a significant Murphy sign on physical exam.  Will obtain a HIDA scan as planned by GI as an outpatient -Possibly related to her marijuana use, however has been using marijuana for years and never had any similar symptoms. -Supportive treatment with n.p.o., IV fluids, scheduled Reglan as well as PRN antiemetics and pain control. -Other considerations would be gastroparesis (however she is not diabetic -A1c pending), versus more rare endocrine causes vs other  Hypokalemia -Likely in the setting of poor p.o. intake, potassium repleted in the emergency room, will recheck tomorrow morning  Elevated LFTs -HIDA scan pending, she did have tenderness on exam, obtain acute hepatitis panel and monitor LFT trend  Marijuana use -Counseled for cessation  Slight creatinine elevation -Probably due to dehydration, provide IV fluids and repeat BMP in the morning   DVT prophylaxis: Lovenox  Code Status: Full code  Family Communication: no family at  bedside Disposition Plan: admit to [place], [place] home in stable Consults called: None    Admission status: Observation  At the point of initial evaluation, it is my clinical opinion that admission for OBSERVATION is reasonable and necessary because the patient's presenting complaints in the context of their chronic conditions represent sufficient risk of deterioration or significant morbidity to constitute reasonable grounds for close observation in the hospital setting, but that the patient may be medically stable for discharge from the hospital within 24 to 48 hours.    Marzetta Board, MD [facility] Pager [phone]  If 7PM-7AM, please contact night-coverage [email or web address] Password [facility]  09/26/2017, 12:55 PM

## 2017-09-26 NOTE — ED Notes (Signed)
ED TO INPATIENT HANDOFF REPORT  Name/Age/Gender Haley Freeman 42 y.o. female  Code Status   Home/SNF/Other Home  Chief Complaint abdominal pain/dizziness  Level of Care/Admitting Diagnosis ED Disposition    ED Disposition Condition [facility] Area: [place] [100102]  Level of Care: Med-Surg [16]  Diagnosis: Intractable nausea and vomiting [060045]  Admitting Physician: Caren Griffins [phone]  Attending Physician: Caren Griffins [[id number]]  PT Class (Do Not Modify): Observation [104]  PT Acc Code (Do Not Modify): Observation [10022]       Medical History Past Medical History:  Diagnosis Date  . Alcohol abuse   . Anxiety   . Depression   . Headache(784.0)   . Neuromuscular disorder (HCC)     Allergies Allergies  Allergen Reactions  . Strawberry (Diagnostic) Shortness Of Breath  . Penicillins Swelling    Swells throat. Has patient had a PCN reaction causing immediate rash, facial/tongue/throat swelling, SOB or lightheadedness with hypotension: Yes Has patient had a PCN reaction causing severe rash involving mucus membranes or skin necrosis: No Has patient had a PCN reaction that required hospitalization: Yes Has patient had a PCN reaction occurring within the last 10 years: No If all of the above answers are "NO", then may proceed with Cephalosporin use.     IV Location/Drains/Wounds Patient Lines/Drains/Airways Status   Active Line/Drains/Airways    Name:   Placement date:   Placement time:   Site:   Days:   Peripheral IV 09/26/17 Right;Upper Arm   09/26/17    1118    Arm   less than 1          Labs/Imaging Results for orders placed or performed during the hospital encounter of 09/26/17 (from the past 48 hour(s))  Urinalysis, Routine w reflex microscopic     Status: Abnormal   Collection Time: 09/26/17 10:21 AM  Result Value Ref Range   Color, Urine YELLOW YELLOW   APPearance CLOUDY (A) CLEAR   Specific  Gravity, Urine 1.016 1.005 - 1.030   pH 6.0 5.0 - 8.0   Glucose, UA NEGATIVE NEGATIVE mg/dL   Hgb urine dipstick SMALL (A) NEGATIVE   Bilirubin Urine NEGATIVE NEGATIVE   Ketones, ur NEGATIVE NEGATIVE mg/dL   Protein, ur 100 (A) NEGATIVE mg/dL   Nitrite NEGATIVE NEGATIVE   Leukocytes, UA LARGE (A) NEGATIVE   RBC / HPF 21-50 0 - 5 RBC/hpf   WBC, UA >50 (H) 0 - 5 WBC/hpf   Bacteria, UA RARE (A) NONE SEEN   Squamous Epithelial / LPF 0-5 0 - 5   Mucus PRESENT    Hyaline Casts, UA PRESENT    Non Squamous Epithelial 6-10 (A) NONE SEEN    Comment: Performed at [facility], [place] [address], [place], [place] [address]  Rapid urine drug screen (hospital performed)     Status: Abnormal   Collection Time: 09/26/17 10:21 AM  Result Value Ref Range   Opiates POSITIVE (A) NONE DETECTED   Cocaine NONE DETECTED NONE DETECTED   Benzodiazepines NONE DETECTED NONE DETECTED   Amphetamines POSITIVE (A) NONE DETECTED   Tetrahydrocannabinol POSITIVE (A) NONE DETECTED   Barbiturates POSITIVE (A) NONE DETECTED    Comment: (NOTE) DRUG SCREEN FOR MEDICAL PURPOSES ONLY.  IF CONFIRMATION IS NEEDED FOR ANY PURPOSE, NOTIFY LAB WITHIN 5 DAYS. LOWEST DETECTABLE LIMITS FOR URINE DRUG SCREEN Drug Class  Cutoff (ng/mL) Amphetamine and metabolites    1000 Barbiturate and metabolites    200 Benzodiazepine                 785 Tricyclics and metabolites     300 Opiates and metabolites        300 Cocaine and metabolites        300 THC                            50 Performed at [facility], [place] [address], [place], [place] [address]   hCG, quantitative, pregnancy     Status: None   Collection Time: 09/26/17 10:21 AM  Result Value Ref Range   hCG, Beta Chain, Quant, S <1 <5 mIU/mL    Comment:          GEST. AGE      CONC.  (mIU/mL)   <=1 WEEK        5 - 50     2 WEEKS       50 - 500     3 WEEKS       100 - 10,000     4 WEEKS     1,000 - 30,000     5  WEEKS     3,500 - 115,000   6-8 WEEKS     12,000 - 270,000    12 WEEKS     15,000 - 220,000        FEMALE AND NON-PREGNANT FEMALE:     LESS THAN 5 mIU/mL Performed at [facility], [place] [address], [place], [place] [address]   I-Stat beta hCG blood, ED     Status: Abnormal   Collection Time: 09/26/17 10:55 AM  Result Value Ref Range   I-stat hCG, quantitative 5.2 (H) <5 mIU/mL   Comment 3            Comment:   GEST. AGE      CONC.  (mIU/mL)   <=1 WEEK        5 - 50     2 WEEKS       50 - 500     3 WEEKS       100 - 10,000     4 WEEKS     1,000 - 30,000        FEMALE AND NON-PREGNANT FEMALE:     LESS THAN 5 mIU/mL   Lipase, blood     Status: None   Collection Time: 09/26/17 11:19 AM  Result Value Ref Range   Lipase 28 11 - 51 U/L    Comment: Performed at [facility], [place] [address] [place], [place] [address]  Comprehensive metabolic panel     Status: Abnormal   Collection Time: 09/26/17 11:19 AM  Result Value Ref Range   Sodium 139 135 - 145 mmol/L   Potassium 3.4 (L) 3.5 - 5.1 mmol/L   Chloride 102 98 - 111 mmol/L   CO2 26 22 - 32 mmol/L   Glucose, Bld 113 (H) 70 - 99 mg/dL   BUN 10 6 - 20 mg/dL   Creatinine, Ser 1.29 (H) 0.44 - 1.00 mg/dL   Calcium 9.5 8.9 - 10.3 mg/dL   Total Protein 7.8 6.5 - 8.1 g/dL   Albumin 4.0 3.5 - 5.0 g/dL   AST 90 (H) 15 - 41 U/L   ALT 122 (H) 0 - 44 U/L  Alkaline Phosphatase 107 38 - 126 U/L   Total Bilirubin 0.6 0.3 - 1.2 mg/dL   GFR calc non Af Amer 50 (L) >60 mL/min   GFR calc Af Amer 58 (L) >60 mL/min    Comment: (NOTE) The eGFR has been calculated using the CKD EPI equation. This calculation has not been validated in all clinical situations. eGFR's persistently <60 mL/min signify possible Chronic Kidney Disease.    Anion gap 11 5 - 15    Comment: Performed at [facility], [place] [address], [place], [place] [address]  CBC     Status: Abnormal   Collection Time: 09/26/17 11:19  AM  Result Value Ref Range   WBC 16.2 (H) 4.0 - 10.5 K/uL   RBC 4.98 3.87 - 5.11 MIL/uL   Hemoglobin 10.7 (L) 12.0 - 15.0 g/dL   HCT 35.0 (L) 36.0 - 46.0 %   MCV 70.3 (L) 78.0 - 100.0 fL   MCH 21.5 (L) 26.0 - 34.0 pg   MCHC 30.6 30.0 - 36.0 g/dL   RDW 18.2 (H) 11.5 - 15.5 %   Platelets 343 150 - 400 K/uL    Comment: Performed at [facility], [place] [address], [place], [place] [address]   No results found.  Pending Labs Unresulted Labs (From admission, onward)    Start     Ordered   09/26/17 1255  Hepatitis panel, acute  Once,   R     09/26/17 1254   Signed and Held  HIV antibody (Routine Testing)  Once,   R     Signed and Held   Signed and Held  Comprehensive metabolic panel  Tomorrow morning,   R     Signed and Held   Signed and Held  CBC  Tomorrow morning,   R     Signed and Held          Vitals/Pain Today's Vitals   09/26/17 1100 09/26/17 1133 09/26/17 1200 09/26/17 1232  BP: (!) 155/57 128/77 127/74 105/88  Pulse: 83 88 91 94  Resp: 18 19 (!) 21 20  Temp:      TempSrc:      SpO2: 100% 100% 100% 100%  Weight:      Height:      PainSc:        Isolation Precautions No active isolations  Medications Medications  potassium chloride SA (K-DUR,KLOR-CON) CR tablet 40 mEq (0 mEq Oral Hold 09/26/17 1225)  sodium chloride 0.9 % bolus 1,000 mL (0 mLs Intravenous Stopped 09/26/17 1242)  metoCLOPramide (REGLAN) injection 10 mg (10 mg Intravenous Given 09/26/17 1137)  diphenhydrAMINE (BENADRYL) injection 25 mg (25 mg Intravenous Given 09/26/17 1137)  0.9 %  sodium chloride infusion ( Intravenous New Bag/Given 09/26/17 1225)    Mobility walks

## 2017-09-26 NOTE — ED Notes (Signed)
Report called to [facility] for [place], [place]. Imaging will transport to room 1507 once complete with imaging.

## 2017-09-26 NOTE — ED Notes (Signed)
Bed: TR71 Expected date:  Expected time:  Means of arrival:  Comments: EMS-elevated wbc

## 2017-09-26 NOTE — ED Notes (Signed)
Spoke with Imaging. Imaging stated that test will take roughly about 2 hours.

## 2017-09-26 NOTE — ED Triage Notes (Signed)
Patient was seen in the ED yesterday for c/o mid upper abdominal pain, N/V. Patient states she has continued to vomit and have mid upper abdominal pain throughout the night and Am.

## 2017-09-26 NOTE — ED Provider Notes (Signed)
[facility] DEPT Provider Note   CSN: [id number] Arrival date & time: 09/26/17  5638     History   Chief Complaint Chief Complaint  Patient presents with  . Abdominal Pain  . Emesis  . Dizziness    HPI Haley Freeman is a 42 y.o. female.  HPI 42 year old female with history of reported cannabis hyperemesis here with ongoing nausea, vomiting.  Patient has seen GI and recently underwent EGD which showed no significant abnormality.  Patient was just seen yesterday for similar symptoms.  She felt improved with symptomatic control and was sent home with outpatient antiemetics and antacids.  She states that since then, she has been unable to eat or drink.  She is had persistent, worsening nausea, vomiting, and severe epigastric abdominal pain.  She describes the pain is an aching, gnawing, cramp-like sensation is worse with any attempted eating or drinking.  She said associated nonbloody emesis.  She is had some diarrhea.  Symptoms are significantly worse with any attempted eating and she is now unable to even drink water.  She is tried Ensure and boost without success.  She strongly denies any recent marijuana use.  Denies any other recent medication changes.  Past Medical History:  Diagnosis Date  . Alcohol abuse   . Anxiety   . Depression   . Headache(784.0)   . Neuromuscular disorder [facility])     Patient Active Problem List   Diagnosis Date Noted  . Intractable nausea and vomiting 09/26/2017  . Cannabis hyperemesis syndrome concurrent with and due to cannabis abuse ([place]) 09/16/2017  . Nausea and vomiting 09/06/2017  . Dermoid 08/22/2017    Past Surgical History:  Procedure Laterality Date  . CESAREAN SECTION  '99 and '08   x 2  . ESOPHAGOGASTRODUODENOSCOPY N/A 09/16/2017   Procedure: ESOPHAGOGASTRODUODENOSCOPY (EGD) WITH PROPOFOL;  Surgeon: Clarene Essex, MD;  Location: [facility];  Service: Endoscopy;  Laterality: N/A;  . LAPAROSCOPY Right  08/22/2017   Procedure: LAPAROSCOPY OPERATIVE removal of right ovary and right dermoid cyst;  Surgeon: Sloan Leiter, MD;  Location: [facility];  Service: Gynecology;  Laterality: Right;     OB History    Gravida  3   Para  3   Term      Preterm      AB      Living  3     SAB      TAB      Ectopic      Multiple      Live Births               Home Medications    Prior to Admission medications   Medication Sig Start Date End Date Taking? Authorizing Provider  dicyclomine (BENTYL) 20 MG tablet Take 1 tablet (20 mg total) by mouth 4 (four) times daily -  before meals and at bedtime for 14 days. 09/14/17 09/28/17 Yes Mortis, Alvie Heidelberg I, PA-C  hydrOXYzine (ATARAX/VISTARIL) 25 MG tablet Take 50 mg by mouth at bedtime as needed for nausea or vomiting.  09/20/17  Yes [provider]  metoCLOPramide (REGLAN) 5 MG tablet Take 5 mg by mouth 3 (three) times daily before meals. 09/16/17  Yes [provider]  omeprazole (PRILOSEC) 20 MG capsule Take 2 capsules (40 mg total) by mouth daily. 09/09/17 10/09/17 Yes Carmon Sails J, PA-C  ondansetron (ZOFRAN ODT) 4 MG disintegrating tablet Take 1 tablet (4 mg total) by mouth every 8 (eight) hours as needed. Patient taking  differently: Take 4 mg by mouth every 8 (eight) hours as needed for nausea or vomiting.  09/13/17  Yes Isla Pence, MD  pantoprazole (PROTONIX) 40 MG tablet Take 1 tablet (40 mg total) by mouth 2 (two) times daily. 09/13/17  Yes Isla Pence, MD  promethazine (PHENERGAN) 25 MG tablet Take 1 tablet (25 mg total) by mouth every 6 (six) hours as needed for nausea or vomiting. 09/25/17  Yes Carmin Muskrat, MD  ranitidine (ZANTAC) 300 MG tablet Take 1 tablet (300 mg total) by mouth at bedtime. 09/09/17 10/09/17 Yes Kinnie Feil, PA-C  traZODone (DESYREL) 100 MG tablet Take 300 mg by mouth at bedtime.    Yes [provider]  oxyCODONE-acetaminophen (PERCOCET/ROXICET) 5-325 MG tablet Take 1-2 tablets  by mouth every 6 (six) hours as needed. Patient not taking: Reported on 09/21/2017 09/06/17   Jonnie Kind, MD    Family History History reviewed. No pertinent family history.  Social History Social History   Tobacco Use  . Smoking status: Former Smoker    Packs/day: 0.50  . Smokeless tobacco: Never Used  Substance Use Topics  . Alcohol use: No  . Drug use: Yes    Types: Other-see comments, Marijuana    Comment: Occasionally smokes marijuana Reports last  time 09/11/17 but at party on 09/13/17     Allergies   Strawberry (diagnostic) and Penicillins   Review of Systems Review of Systems  Constitutional: Positive for fatigue. Negative for chills and fever.  HENT: Negative for congestion and rhinorrhea.   Eyes: Negative for visual disturbance.  Respiratory: Negative for cough, shortness of breath and wheezing.   Cardiovascular: Negative for chest pain and leg swelling.  Gastrointestinal: Positive for abdominal pain, nausea and vomiting. Negative for diarrhea.  Genitourinary: Negative for dysuria and flank pain.  Musculoskeletal: Negative for neck pain and neck stiffness.  Skin: Negative for rash and wound.  Allergic/Immunologic: Negative for immunocompromised state.  Neurological: Positive for weakness. Negative for syncope and headaches.  All other systems reviewed and are negative.    Physical Exam Updated Vital Signs BP 105/88   Pulse 94   Temp 98.8 F (37.1 C) (Oral)   Resp 20   Ht 5\' 8"  (1.727 m)   Wt 133.8 kg   LMP 09/11/2017   SpO2 100%   BMI 44.85 kg/m   Physical Exam  Constitutional: She is oriented to person, place, and time. She appears well-developed and well-nourished. No distress.  HENT:  Head: Normocephalic and atraumatic.  Eyes: Conjunctivae are normal.  Neck: Neck supple.  Cardiovascular: Normal rate, regular rhythm and normal heart sounds. Exam reveals no friction rub.  No murmur heard. Pulmonary/Chest: Effort normal and breath sounds  normal. No respiratory distress. She has no wheezes. She has no rales.  Abdominal: She exhibits no distension. There is tenderness in the epigastric area. There is no rigidity, no rebound and no guarding.  Musculoskeletal: She exhibits no edema.  Neurological: She is alert and oriented to person, place, and time. She exhibits normal muscle tone.  Skin: Skin is warm. Capillary refill takes less than 2 seconds.  Psychiatric: She has a normal mood and affect.  Nursing note and vitals reviewed.    ED Treatments / Results  Labs (all labs ordered are listed, but only abnormal results are displayed) Labs Reviewed  COMPREHENSIVE METABOLIC PANEL - Abnormal; Notable for the following components:      Result Value   Potassium 3.4 (*)    Glucose, Bld 113 (*)  Creatinine, Ser 1.29 (*)    AST 90 (*)    ALT 122 (*)    GFR calc non Af Amer 50 (*)    GFR calc Af Amer 58 (*)    All other components within normal limits  CBC - Abnormal; Notable for the following components:   WBC 16.2 (*)    Hemoglobin 10.7 (*)    HCT 35.0 (*)    MCV 70.3 (*)    MCH 21.5 (*)    RDW 18.2 (*)    All other components within normal limits  URINALYSIS, ROUTINE W REFLEX MICROSCOPIC - Abnormal; Notable for the following components:   APPearance CLOUDY (*)    Hgb urine dipstick SMALL (*)    Protein, ur 100 (*)    Leukocytes, UA LARGE (*)    WBC, UA >50 (*)    Bacteria, UA RARE (*)    Non Squamous Epithelial 6-10 (*)    All other components within normal limits  RAPID URINE DRUG SCREEN, HOSP PERFORMED - Abnormal; Notable for the following components:   Opiates POSITIVE (*)    Amphetamines POSITIVE (*)    Tetrahydrocannabinol POSITIVE (*)    Barbiturates POSITIVE (*)    All other components within normal limits  I-STAT BETA HCG BLOOD, ED (MC, [facility], AP ONLY) - Abnormal; Notable for the following components:   I-stat hCG, quantitative 5.2 (*)    All other components within normal limits  LIPASE, BLOOD  HCG,  QUANTITATIVE, PREGNANCY  HEPATITIS PANEL, ACUTE    EKG None  Radiology No results found.  Procedures Procedures (including critical care time)  Medications Ordered in ED Medications  potassium chloride SA (K-DUR,KLOR-CON) CR tablet 40 mEq (0 mEq Oral Hold 09/26/17 1225)  sodium chloride 0.9 % bolus 1,000 mL (0 mLs Intravenous Stopped 09/26/17 1242)  metoCLOPramide (REGLAN) injection 10 mg (10 mg Intravenous Given 09/26/17 1137)  diphenhydrAMINE (BENADRYL) injection 25 mg (25 mg Intravenous Given 09/26/17 1137)  0.9 %  sodium chloride infusion ( Intravenous New Bag/Given 09/26/17 1225)  technetium TC 22M mebrofenin (CHOLETEC) injection 5.3 millicurie (5.3 millicuries Intravenous Contrast Given 09/26/17 1320)     Initial Impression / Assessment and Plan / ED Course  I have reviewed the triage vital signs and the nursing notes.  Pertinent labs & imaging results that were available during my care of the patient were reviewed by me and considered in my medical decision making (see chart for details).     42 yo F here with intractable n/v despite outpt management. H/o similar sx and has been worked up by GI in past with recent neg EGD. U/S neg as well on 8/31 without gall stones. Of note, LFTs elevated today, had HIDA scan ordered by Dr. Oletta Lamas for this week. Pt unable to tolerate p.o. despite antiemetics and outpatient management.  Discussed with Dr. Paulita Fujita of GI.  No acute GI intervention needed, but given inability to eat or drink, reasonable to admit.  Given her elevated LFTs, may benefit from inpatient HIDA.  Patient updated in agreement.  Final Clinical Impressions(s) / ED Diagnoses   Final diagnoses:  Intractable vomiting with nausea, unspecified vomiting type  Transaminitis    ED Discharge Orders    None       Duffy Bruce, MD 09/26/17 [phone]

## 2017-09-27 ENCOUNTER — Observation Stay (HOSPITAL_COMMUNITY): Payer: Medicare Other

## 2017-09-27 DIAGNOSIS — K811 Chronic cholecystitis: Secondary | ICD-10-CM | POA: Diagnosis not present

## 2017-09-27 DIAGNOSIS — F1210 Cannabis abuse, uncomplicated: Secondary | ICD-10-CM | POA: Diagnosis present

## 2017-09-27 DIAGNOSIS — Z6841 Body Mass Index (BMI) 40.0 and over, adult: Secondary | ICD-10-CM | POA: Diagnosis not present

## 2017-09-27 DIAGNOSIS — G9389 Other specified disorders of brain: Secondary | ICD-10-CM | POA: Diagnosis not present

## 2017-09-27 DIAGNOSIS — R740 Nonspecific elevation of levels of transaminase and lactic acid dehydrogenase [LDH]: Secondary | ICD-10-CM | POA: Diagnosis present

## 2017-09-27 DIAGNOSIS — E876 Hypokalemia: Secondary | ICD-10-CM | POA: Diagnosis not present

## 2017-09-27 DIAGNOSIS — F1010 Alcohol abuse, uncomplicated: Secondary | ICD-10-CM | POA: Diagnosis present

## 2017-09-27 DIAGNOSIS — Z880 Allergy status to penicillin: Secondary | ICD-10-CM | POA: Diagnosis not present

## 2017-09-27 DIAGNOSIS — R948 Abnormal results of function studies of other organs and systems: Secondary | ICD-10-CM | POA: Diagnosis not present

## 2017-09-27 DIAGNOSIS — F418 Other specified anxiety disorders: Secondary | ICD-10-CM | POA: Diagnosis present

## 2017-09-27 DIAGNOSIS — F419 Anxiety disorder, unspecified: Secondary | ICD-10-CM | POA: Diagnosis present

## 2017-09-27 DIAGNOSIS — R945 Abnormal results of liver function studies: Secondary | ICD-10-CM | POA: Diagnosis not present

## 2017-09-27 DIAGNOSIS — K219 Gastro-esophageal reflux disease without esophagitis: Secondary | ICD-10-CM | POA: Diagnosis not present

## 2017-09-27 DIAGNOSIS — R112 Nausea with vomiting, unspecified: Secondary | ICD-10-CM | POA: Diagnosis not present

## 2017-09-27 DIAGNOSIS — E860 Dehydration: Secondary | ICD-10-CM | POA: Diagnosis present

## 2017-09-27 DIAGNOSIS — Z79899 Other long term (current) drug therapy: Secondary | ICD-10-CM | POA: Diagnosis not present

## 2017-09-27 DIAGNOSIS — K828 Other specified diseases of gallbladder: Secondary | ICD-10-CM | POA: Diagnosis present

## 2017-09-27 DIAGNOSIS — R1013 Epigastric pain: Secondary | ICD-10-CM | POA: Diagnosis not present

## 2017-09-27 DIAGNOSIS — K920 Hematemesis: Secondary | ICD-10-CM | POA: Diagnosis not present

## 2017-09-27 DIAGNOSIS — R7989 Other specified abnormal findings of blood chemistry: Secondary | ICD-10-CM

## 2017-09-27 DIAGNOSIS — Z90722 Acquired absence of ovaries, bilateral: Secondary | ICD-10-CM | POA: Diagnosis not present

## 2017-09-27 DIAGNOSIS — K449 Diaphragmatic hernia without obstruction or gangrene: Secondary | ICD-10-CM | POA: Diagnosis present

## 2017-09-27 DIAGNOSIS — Z91018 Allergy to other foods: Secondary | ICD-10-CM | POA: Diagnosis not present

## 2017-09-27 DIAGNOSIS — R634 Abnormal weight loss: Secondary | ICD-10-CM | POA: Diagnosis not present

## 2017-09-27 DIAGNOSIS — Z87891 Personal history of nicotine dependence: Secondary | ICD-10-CM | POA: Diagnosis not present

## 2017-09-27 DIAGNOSIS — D638 Anemia in other chronic diseases classified elsewhere: Secondary | ICD-10-CM | POA: Diagnosis present

## 2017-09-27 DIAGNOSIS — F329 Major depressive disorder, single episode, unspecified: Secondary | ICD-10-CM | POA: Diagnosis present

## 2017-09-27 DIAGNOSIS — Z7151 Drug abuse counseling and surveillance of drug abuser: Secondary | ICD-10-CM | POA: Diagnosis not present

## 2017-09-27 DIAGNOSIS — R1011 Right upper quadrant pain: Secondary | ICD-10-CM | POA: Diagnosis not present

## 2017-09-27 LAB — RAPID URINE DRUG SCREEN, HOSP PERFORMED
Amphetamines: NOT DETECTED
Barbiturates: NOT DETECTED
Benzodiazepines: NOT DETECTED
Cocaine: NOT DETECTED
Opiates: POSITIVE — AB
Tetrahydrocannabinol: POSITIVE — AB

## 2017-09-27 LAB — COMPREHENSIVE METABOLIC PANEL
ALK PHOS: 92 U/L (ref 38–126)
ALT: 82 U/L — ABNORMAL HIGH (ref 0–44)
AST: 43 U/L — AB (ref 15–41)
Albumin: 3.1 g/dL — ABNORMAL LOW (ref 3.5–5.0)
Anion gap: 6 (ref 5–15)
BILIRUBIN TOTAL: 0.5 mg/dL (ref 0.3–1.2)
BUN: 7 mg/dL (ref 6–20)
CALCIUM: 8.7 mg/dL — AB (ref 8.9–10.3)
CO2: 28 mmol/L (ref 22–32)
Chloride: 106 mmol/L (ref 98–111)
Creatinine, Ser: 1.12 mg/dL — ABNORMAL HIGH (ref 0.44–1.00)
GFR calc Af Amer: >60 mL/min (ref >60)
GFR, EST NON AFRICAN AMERICAN: 60 mL/min — AB (ref >60)
Glucose, Bld: 103 mg/dL — ABNORMAL HIGH (ref 70–99)
POTASSIUM: 3.9 mmol/L (ref 3.5–5.1)
Sodium: 140 mmol/L (ref 135–145)
TOTAL PROTEIN: 6.4 g/dL — AB (ref 6.5–8.1)

## 2017-09-27 LAB — HEPATITIS PANEL, ACUTE
HCV Ab: <0.1 s/co ratio (ref 0.0–0.9)
HEP A IGM: NEGATIVE
HEP B C IGM: NEGATIVE
HEP B S AG: NEGATIVE

## 2017-09-27 LAB — CBC
HEMATOCRIT: 33.7 % — AB (ref 36.0–46.0)
Hemoglobin: 10.1 g/dL — ABNORMAL LOW (ref 12.0–15.0)
MCH: 21.8 pg — ABNORMAL LOW (ref 26.0–34.0)
MCHC: 30 g/dL (ref 30.0–36.0)
MCV: 72.8 fL — ABNORMAL LOW (ref 78.0–100.0)
PLATELETS: 265 10*3/uL (ref 150–400)
RBC: 4.63 MIL/uL (ref 3.87–5.11)
RDW: 18.7 % — ABNORMAL HIGH (ref 11.5–15.5)
WBC: 13.7 10*3/uL — AB (ref 4.0–10.5)

## 2017-09-27 LAB — HIV ANTIBODY (ROUTINE TESTING W REFLEX): HIV Screen 4th Generation wRfx: NONREACTIVE

## 2017-09-27 MED ORDER — IOHEXOL 300 MG/ML  SOLN
100.0000 mL | Freq: Once | INTRAMUSCULAR | Status: AC | PRN
  Administered 2017-09-27: 100 mL via INTRAVENOUS

## 2017-09-27 MED ORDER — BARIUM SULFATE 0.1 % PO SUSP: 450.0000 mL | Freq: Once | ORAL | Status: DC

## 2017-09-27 MED ORDER — POTASSIUM CHLORIDE IN NACL 40-0.9 MEQ/L-% IV SOLN
INTRAVENOUS | Status: DC
  Administered 2017-09-27 – 2017-09-28 (×3): 100 mL/h via INTRAVENOUS
  Filled 2017-09-27 (×6): qty 1000

## 2017-09-27 MED ORDER — BARIUM SULFATE 0.1 % PO SUSP
ORAL | Status: AC
  Administered 2017-09-27: 1350 mL via ORAL
  Filled 2017-09-27: qty 3

## 2017-09-27 MED ORDER — IOPAMIDOL (ISOVUE-300) INJECTION 61%: 100.0000 mL | Freq: Once | INTRAVENOUS | Status: DC | PRN

## 2017-09-27 NOTE — Progress Notes (Signed)
SBAR report given to Vera, RN.   

## 2017-09-27 NOTE — Progress Notes (Signed)
PROGRESS NOTE    Haley Freeman   OEU:[id number]  DOB: [birth year]  DOA: 09/26/2017 PCP: [facility]   Brief Narrative:  Haley Freeman is a 42 y.o. female with medical history significant of prior history of alcohol abuse but nothing recently, marijuana use, anxiety, presents to the hospital with intractable nausea and vomiting for about 1 month now with a 20 lb weight loss. Recent EGD unrevealing.    Subjective: Feels upper abdominal discomfort and mild nausea. No vomiting today.     Assessment & Plan:   Principal Problem:   Intractable nausea and vomiting with leukocytosis - ongoing work up by GI and Gen surgery- she will have a CT enterogram today - NPO for now- cont IVF  Active Problems:   Hypokalemia - replacing    Elevated LFTs   Abnormal biliary HIDA scan - gen sugery consulted - further work up as above-  - Hepatitis panel negative    Marijuana abuse  - per history and noted on UDS  DVT prophylaxis: Lovenox Code Status: Full code Family Communication:  Disposition Plan: home when stable Consultants:   GI  Gen sugery Procedures:  Antimicrobials:  Anti-infectives (From admission, onward)   None       Objective: Vitals:   09/26/17 1232 09/26/17 1552 09/26/17 2034 09/27/17 0439  BP: 105/88 119/73 (!) 156/98 (!) 154/82  Pulse: 94 92 85 90  Resp: 20 16 20 18   Temp:  98.4 F (36.9 C) 99.1 F (37.3 C) 98.5 F (36.9 C)  TempSrc:  Oral Oral Oral  SpO2: 100% 97% 98% 97%  Weight:      Height:        Intake/Output Summary (Last 24 hours) at 09/27/2017 1244 Last data filed at 09/27/2017 0900 Gross per 24 hour  Intake 366.52 ml  Output -  Net 366.52 ml   Filed Weights   09/26/17 1002  Weight: 133.8 kg    Examination: General exam: Appears comfortable  HEENT: PERRLA, oral mucosa moist, no sclera icterus or thrush Respiratory system: Clear to auscultation. Respiratory effort normal. Cardiovascular system: S1 & S2  heard, RRR.   Gastrointestinal system: Abdomen soft,  Tender across upper abdomen, nondistended. Normal bowel sounds No organomegaly Central nervous system: Alert and oriented. No focal neurological deficits. Extremities: No cyanosis, clubbing or edema Skin: No rashes or ulcers Psychiatry:  Mood & affect appropriate.     Data Reviewed: I have personally reviewed following labs and imaging studies  CBC: Recent Labs  Lab 09/21/17 1622 09/25/17 0909 09/26/17 1119 09/27/17 0614  WBC 16.0* 17.3* 16.2* 13.7*  NEUTROABS 13.3* 11.9*  --   --   HGB 10.6* 11.9* 10.7* 10.1*  HCT 34.6* 38.8 35.0* 33.7*  MCV 70.6* 70.5* 70.3* 72.8*  PLT 352 370 343 154   Basic Metabolic Panel: Recent Labs  Lab 09/21/17 1622 09/25/17 0909 09/26/17 1119 09/27/17 0614  NA 141 138 139 140  K 3.2* 3.2* 3.4* 3.9  CL 108 97* 102 106  CO2 21* 29 26 28   GLUCOSE 128* 125* 113* 103*  BUN 8 12 10 7   CREATININE 1.03* 1.28* 1.29* 1.12*  CALCIUM 9.4 9.7 9.5 8.7*   GFR: Estimated Creatinine Clearance: 94.9 mL/min (A) (by C-G formula based on SCr of 1.12 mg/dL (H)). Liver Function Tests: Recent Labs  Lab 09/21/17 1622 09/26/17 1119 09/27/17 0614  AST 22 90* 43*  ALT 19 122* 82*  ALKPHOS 96 107 92  BILITOT 0.4 0.6 0.5  PROT 7.6 7.8 6.4*  ALBUMIN 3.6 4.0 3.1*   Recent Labs  Lab 09/21/17 1622 09/26/17 1119  LIPASE 39 28   No results for input(s): AMMONIA in the last 168 hours. Coagulation Profile: No results for input(s): INR, PROTIME in the last 168 hours. Cardiac Enzymes: No results for input(s): CKTOTAL, CKMB, CKMBINDEX, TROPONINI in the last 168 hours. BNP (last 3 results) No results for input(s): PROBNP in the last 8760 hours. HbA1C: No results for input(s): HGBA1C in the last 72 hours. CBG: No results for input(s): GLUCAP in the last 168 hours. Lipid Profile: No results for input(s): CHOL, HDL, LDLCALC, TRIG, CHOLHDL, LDLDIRECT in the last 72 hours. Thyroid Function Tests: No results  for input(s): TSH, T4TOTAL, FREET4, T3FREE, THYROIDAB in the last 72 hours. Anemia Panel: No results for input(s): VITAMINB12, FOLATE, FERRITIN, TIBC, IRON, RETICCTPCT in the last 72 hours. Urine analysis:    Component Value Date/Time   COLORURINE YELLOW 09/26/2017 1021   APPEARANCEUR CLOUDY (A) 09/26/2017 1021   LABSPEC 1.016 09/26/2017 1021   PHURINE 6.0 09/26/2017 1021   GLUCOSEU NEGATIVE 09/26/2017 1021   HGBUR SMALL (A) 09/26/2017 1021   BILIRUBINUR NEGATIVE 09/26/2017 1021   KETONESUR NEGATIVE 09/26/2017 1021   PROTEINUR 100 (A) 09/26/2017 1021   UROBILINOGEN 0.2 09/22/2014 0730   NITRITE NEGATIVE 09/26/2017 1021   LEUKOCYTESUR LARGE (A) 09/26/2017 1021   Sepsis Labs: @LABRCNTIP (procalcitonin:4,lacticidven:4) )No results found for this or any previous visit (from the past 240 hour(s)).       Radiology Studies: Nm Hepato W/eject Fract  Result Date: 09/26/2017 CLINICAL DATA:  Nausea, vomiting EXAM: NUCLEAR MEDICINE HEPATOBILIARY IMAGING WITH GALLBLADDER EF TECHNIQUE: Sequential images of the abdomen were obtained out to 60 minutes following intravenous administration of radiopharmaceutical. After oral ingestion of Ensure, gallbladder ejection fraction was determined. At 60 min, normal ejection fraction is greater than 33%. RADIOPHARMACEUTICALS:  5.3 mCi Tc-36m  Choletec IV COMPARISON:  None. FINDINGS: Prompt uptake and biliary excretion of activity by the liver is seen. Gallbladder activity is visualized, consistent with patency of cystic duct. Biliary activity passes into small bowel, consistent with patent common bile duct. Calculated gallbladder ejection fraction is 9.2%. (Normal gallbladder ejection fraction with Ensure is greater than 33%.) IMPRESSION: No evidence of cystic duct or common bile duct obstruction. Low gallbladder ejection fraction, 9%. Electronically Signed   By: Rolm Baptise M.D.   On: 09/26/2017 16:06      Scheduled Meds: . barium  450 mL Oral Once  .  enoxaparin (LOVENOX) injection  40 mg Subcutaneous Q24H  . metoCLOPramide (REGLAN) injection  5 mg Intravenous Q8H  . pantoprazole  40 mg Oral Daily  . traZODone  300 mg Oral QHS   Continuous Infusions: . sodium chloride 100 mL/hr at 09/27/17 0813     LOS: 0 days    Time spent in minutes: 35    Debbe Odea, MD [facility] Pager: [email or web address] Password [facility] 09/27/2017, 12:44 PM

## 2017-09-27 NOTE — Consult Note (Signed)
Chief Complaint:  persistant nausea and vomiting-began after laparoscopic pelvic surgery 6 weeks ago  History of Present Illness:  Haley Freeman is an 42 y.o. female who I was asked to evaluate because of a HIDA scan showing a 9% ejection fraction.  Ultrasound was unremarkable.  Has had upper endoscopy that showed a hiatal hernia.  She has had a CT scan about 3 weeks ago.    She is complaining of emesis even with water.  She has documented 20 lb weight loss in Dr. Oletta Lamas office but remains clinically obese.     Past Medical History:  Diagnosis Date  . Alcohol abuse   . Anxiety   . Depression   . Headache(784.0)   . Neuromuscular disorder [facility])     Past Surgical History:  Procedure Laterality Date  . CESAREAN SECTION  '99 and '08   x 2  . ESOPHAGOGASTRODUODENOSCOPY N/A 09/16/2017   Procedure: ESOPHAGOGASTRODUODENOSCOPY (EGD) WITH PROPOFOL;  Surgeon: Clarene Essex, MD;  Location: [facility];  Service: Endoscopy;  Laterality: N/A;  . LAPAROSCOPY Right 08/22/2017   Procedure: LAPAROSCOPY OPERATIVE removal of right ovary and right dermoid cyst;  Surgeon: Sloan Leiter, MD;  Location: [facility];  Service: Gynecology;  Laterality: Right;    Current Facility-Administered Medications  Medication Dose Route Frequency Provider Last Rate Last Dose  . 0.9 %  sodium chloride infusion   Intravenous Continuous Caren Griffins, MD 100 mL/hr at 09/27/17 0813    . enoxaparin (LOVENOX) injection 40 mg  40 mg Subcutaneous Q24H Caren Griffins, MD   40 mg at 09/26/17 2115  . metoCLOPramide (REGLAN) injection 5 mg  5 mg Intravenous Q8H Caren Griffins, MD   5 mg at 09/27/17 2035  . morphine 2 MG/ML injection 2 mg  2 mg Intravenous Q4H PRN Caren Griffins, MD   2 mg at 09/27/17 5974  . pantoprazole (PROTONIX) EC tablet 40 mg  40 mg Oral Daily Caren Griffins, MD   40 mg at 09/26/17 2117  . promethazine (PHENERGAN) injection 6.25 mg  6.25 mg Intravenous Q6H PRN Caren Griffins, MD   6.25 mg  at 09/27/17 0102  . traZODone (DESYREL) tablet 300 mg  300 mg Oral QHS Caren Griffins, MD   300 mg at 09/26/17 2116   Strawberry (diagnostic) and Penicillins History reviewed. No pertinent family history. Social History:   reports that she has quit smoking. She smoked 0.50 packs per day. She has never used smokeless tobacco. She reports that she has current or past drug history. Drugs: Other-see comments and Marijuana. She reports that she does not drink alcohol.   REVIEW OF SYSTEMS : Negative except for + marijuana use  Physical Exam:   Blood pressure (!) 154/82, pulse 90, temperature 98.5 F (36.9 C), temperature source Oral, resp. rate 18, height '5\' 8"'$  (1.727 m), weight 133.8 kg, last menstrual period 09/11/2017, SpO2 97 %. Body mass index is 44.85 kg/m.  Gen:  WDobese AAF NAD  Neurological: Alert and oriented to person, place, and time. Motor and sensory function is grossly intact  Head: Normocephalic and atraumatic.  Eyes: Conjunctivae are normal. Pupils are equal, round, and reactive to light. No scleral icterus.  Neck: Normal range of motion. Neck supple. No tracheal deviation or thyromegaly present.  Cardiovascular:  SR without murmurs or gallops.  No carotid bruits Breast:  Not examined Respiratory: Effort normal.  No respiratory distress. No chest wall tenderness. Breath sounds normal.  No wheezes, rales or rhonchi.  Abdomen:  Some tenderness to palpation across the upper abdomen  GU:  Not examined Musculoskeletal: Normal range of motion. Extremities are nontender. No cyanosis, edema or clubbing noted Lymphadenopathy: No cervical, preauricular, postauricular or axillary adenopathy is present Skin: Skin is warm and dry. No rash noted. No diaphoresis. No erythema. No pallor. Pscyh: Normal mood and affect. Behavior is normal. Judgment and thought content normal.   LABORATORY RESULTS: Results for orders placed or performed during the hospital encounter of 09/26/17 (from the  past 48 hour(s))  Urinalysis, Routine w reflex microscopic     Status: Abnormal   Collection Time: 09/26/17 10:21 AM  Result Value Ref Range   Color, Urine YELLOW YELLOW   APPearance CLOUDY (A) CLEAR   Specific Gravity, Urine 1.016 1.005 - 1.030   pH 6.0 5.0 - 8.0   Glucose, UA NEGATIVE NEGATIVE mg/dL   Hgb urine dipstick SMALL (A) NEGATIVE   Bilirubin Urine NEGATIVE NEGATIVE   Ketones, ur NEGATIVE NEGATIVE mg/dL   Protein, ur 100 (A) NEGATIVE mg/dL   Nitrite NEGATIVE NEGATIVE   Leukocytes, UA LARGE (A) NEGATIVE   RBC / HPF 21-50 0 - 5 RBC/hpf   WBC, UA >50 (H) 0 - 5 WBC/hpf   Bacteria, UA RARE (A) NONE SEEN   Squamous Epithelial / LPF 0-5 0 - 5   Mucus PRESENT    Hyaline Casts, UA PRESENT    Non Squamous Epithelial 6-10 (A) NONE SEEN    Comment: Performed at [facility], [place] [address] [place], [place] [address]  Rapid urine drug screen (hospital performed)     Status: Abnormal   Collection Time: 09/26/17 10:21 AM  Result Value Ref Range   Opiates POSITIVE (A) NONE DETECTED   Cocaine NONE DETECTED NONE DETECTED   Benzodiazepines NONE DETECTED NONE DETECTED   Amphetamines POSITIVE (A) NONE DETECTED   Tetrahydrocannabinol POSITIVE (A) NONE DETECTED   Barbiturates POSITIVE (A) NONE DETECTED    Comment: (NOTE) DRUG SCREEN FOR MEDICAL PURPOSES ONLY.  IF CONFIRMATION IS NEEDED FOR ANY PURPOSE, NOTIFY LAB WITHIN 5 DAYS. LOWEST DETECTABLE LIMITS FOR URINE DRUG SCREEN Drug Class                     Cutoff (ng/mL) Amphetamine and metabolites    1000 Barbiturate and metabolites    200 Benzodiazepine                 626 Tricyclics and metabolites     300 Opiates and metabolites        300 Cocaine and metabolites        300 THC                            50 Performed at [facility], [place] [address], [place],  [address]   hCG, quantitative, pregnancy     Status: None   Collection Time: 09/26/17 10:21 AM  Result Value Ref Range    hCG, Beta Chain, Quant, S <1 <5 mIU/mL    Comment:          GEST. AGE      CONC.  (mIU/mL)   <=1 WEEK        5 - 50     2 WEEKS       50 - 500     3 WEEKS       100 - 10,000     4 WEEKS  1,000 - 30,000     5 WEEKS     3,500 - 115,000   6-8 WEEKS     12,000 - 270,000    12 WEEKS     15,000 - 220,000        FEMALE AND NON-PREGNANT FEMALE:     LESS THAN 5 mIU/mL Performed at [facility], [place] [address], [place], [place] [address]   I-Stat beta hCG blood, ED     Status: Abnormal   Collection Time: 09/26/17 10:55 AM  Result Value Ref Range   I-stat hCG, quantitative 5.2 (H) <5 mIU/mL   Comment 3            Comment:   GEST. AGE      CONC.  (mIU/mL)   <=1 WEEK        5 - 50     2 WEEKS       50 - 500     3 WEEKS       100 - 10,000     4 WEEKS     1,000 - 30,000        FEMALE AND NON-PREGNANT FEMALE:     LESS THAN 5 mIU/mL   Lipase, blood     Status: None   Collection Time: 09/26/17 11:19 AM  Result Value Ref Range   Lipase 28 11 - 51 U/L    Comment: Performed at [facility],  [address], [place], [place] [address]  Comprehensive metabolic panel     Status: Abnormal   Collection Time: 09/26/17 11:19 AM  Result Value Ref Range   Sodium 139 135 - 145 mmol/L   Potassium 3.4 (L) 3.5 - 5.1 mmol/L   Chloride 102 98 - 111 mmol/L   CO2 26 22 - 32 mmol/L   Glucose, Bld 113 (H) 70 - 99 mg/dL   BUN 10 6 - 20 mg/dL   Creatinine, Ser 1.29 (H) 0.44 - 1.00 mg/dL   Calcium 9.5 8.9 - 10.3 mg/dL   Total Protein 7.8 6.5 - 8.1 g/dL   Albumin 4.0 3.5 - 5.0 g/dL   AST 90 (H) 15 - 41 U/L   ALT 122 (H) 0 - 44 U/L   Alkaline Phosphatase 107 38 - 126 U/L   Total Bilirubin 0.6 0.3 - 1.2 mg/dL   GFR calc non Af Amer 50 (L) >60 mL/min   GFR calc Af Amer 58 (L) >60 mL/min    Comment: (NOTE) The eGFR has been calculated using the CKD EPI equation. This calculation has not been validated in all clinical situations. eGFR's persistently <60 mL/min signify  possible Chronic Kidney Disease.    Anion gap 11 5 - 15    Comment: Performed at [facility], [place] [address] [place], [place] [address]  CBC     Status: Abnormal   Collection Time: 09/26/17 11:19 AM  Result Value Ref Range   WBC 16.2 (H) 4.0 - 10.5 K/uL   RBC 4.98 3.87 - 5.11 MIL/uL   Hemoglobin 10.7 (L) 12.0 - 15.0 g/dL   HCT 35.0 (L) 36.0 - 46.0 %   MCV 70.3 (L) 78.0 - 100.0 fL   MCH 21.5 (L) 26.0 - 34.0 pg   MCHC 30.6 30.0 - 36.0 g/dL   RDW 18.2 (H) 11.5 - 15.5 %   Platelets 343 150 - 400 K/uL    Comment: Performed at [facility], [place] [address], [place], [place] [address]  Hepatitis panel,  acute     Status: None   Collection Time: 09/26/17  4:03 PM  Result Value Ref Range   Hepatitis B Surface Ag Negative Negative   HCV Ab <0.1 0.0 - 0.9 s/co ratio    Comment: (NOTE)                                  Negative:     < 0.8                             Indeterminate: 0.8 - 0.9                                  Positive:     > 0.9 The CDC recommends that a positive HCV antibody result be followed up with a HCV Nucleic Acid Amplification test ([id number]). Performed At: [facility] [place], [place] [id number] Rush Farmer MD [id number]    Hep A IgM Negative Negative   Hep B C IgM Negative Negative  HIV antibody (Routine Testing)     Status: None   Collection Time: 09/26/17  4:03 PM  Result Value Ref Range   HIV Screen 4th Generation wRfx Non Reactive Non Reactive    Comment: (NOTE) Performed At: [facility] [place], [place] [id number] Rush Farmer MD LG:[id number]   Comprehensive metabolic panel     Status: Abnormal   Collection Time: 09/27/17  6:14 AM  Result Value Ref Range   Sodium 140 135 - 145 mmol/L   Potassium 3.9 3.5 - 5.1 mmol/L   Chloride 106 98 - 111 mmol/L   CO2 28 22 - 32 mmol/L   Glucose, Bld 103 (H) 70 - 99 mg/dL   BUN 7 6 - 20 mg/dL   Creatinine, Ser 1.12 (H)  0.44 - 1.00 mg/dL   Calcium 8.7 (L) 8.9 - 10.3 mg/dL   Total Protein 6.4 (L) 6.5 - 8.1 g/dL   Albumin 3.1 (L) 3.5 - 5.0 g/dL   AST 43 (H) 15 - 41 U/L   ALT 82 (H) 0 - 44 U/L   Alkaline Phosphatase 92 38 - 126 U/L   Total Bilirubin 0.5 0.3 - 1.2 mg/dL   GFR calc non Af Amer 60 (L) >60 mL/min   GFR calc Af Amer >60 >60 mL/min    Comment: (NOTE) The eGFR has been calculated using the CKD EPI equation. This calculation has not been validated in all clinical situations. eGFR's persistently <60 mL/min signify possible Chronic Kidney Disease.    Anion gap 6 5 - 15    Comment: Performed at [facility], [place] [address] [place], [place] [address]  CBC     Status: Abnormal   Collection Time: 09/27/17  6:14 AM  Result Value Ref Range   WBC 13.7 (H) 4.0 - 10.5 K/uL   RBC 4.63 3.87 - 5.11 MIL/uL   Hemoglobin 10.1 (L) 12.0 - 15.0 g/dL   HCT 33.7 (L) 36.0 - 46.0 %   MCV 72.8 (L) 78.0 - 100.0 fL   MCH 21.8 (L) 26.0 - 34.0 pg   MCHC 30.0 30.0 - 36.0 g/dL   RDW 18.7 (H) 11.5 - 15.5 %   Platelets 265 150 - 400 K/uL    Comment: SPECIMEN CHECKED FOR CLOTS REPEATED TO VERIFY  Performed at [facility], [place] [address] [place], [place], [place] [address]      RADIOLOGY RESULTS: Nm Hepato W/eject Fract  Result Date: 09/26/2017 CLINICAL DATA:  Nausea, vomiting EXAM: NUCLEAR MEDICINE HEPATOBILIARY IMAGING WITH GALLBLADDER EF TECHNIQUE: Sequential images of the abdomen were obtained out to 60 minutes following intravenous administration of radiopharmaceutical. After oral ingestion of Ensure, gallbladder ejection fraction was determined. At 60 min, normal ejection fraction is greater than 33%. RADIOPHARMACEUTICALS:  5.3 mCi Tc-64m Choletec IV COMPARISON:  None. FINDINGS: Prompt uptake and biliary excretion of activity by the liver is seen. Gallbladder activity is visualized, consistent with patency of cystic duct. Biliary activity passes into small bowel, consistent with  patent common bile duct. Calculated gallbladder ejection fraction is 9.2%. (Normal gallbladder ejection fraction with Ensure is greater than 33%.) IMPRESSION: No evidence of cystic duct or common bile duct obstruction. Low gallbladder ejection fraction, 9%. Electronically Signed   By: KRolm BaptiseM.D.   On: 09/26/2017 16:06    Problem List: Patient Active Problem List   Diagnosis Date Noted  . Intractable nausea and vomiting 09/26/2017  . Cannabis hyperemesis syndrome concurrent with and due to cannabis abuse (HPurvis 09/16/2017  . Nausea and vomiting 09/06/2017  . Dermoid 08/22/2017    Assessment & Plan: I was asked to see about appropriateness of cholecystectomy in the face of a low ejection fraction on HIDA.  The onset of her nausea dates back to her pelvic surgery.  I discussed with Dr. EOletta Lamasand we thought that CT enterography to look for a pelvic adhesion for a source for a partial SBO.  This has been ordered.      Matt B. MHassell Done MD, [facility]Put[facility], [facility] [phone] [phone] 09/27/2017 9:53 AM

## 2017-09-27 NOTE — Progress Notes (Signed)
Our practices been following her for the past 2 weeks or so for severe vomiting even clear liquids. She said multiple ER visits. She had a recent EGDthat was negative. All this began afternoon oopherectomy about 1 month ago by Dr. Rosana Hoes. She's continued to vomit and has a documented weight loss in the past 2 weeks of 12 pounds. Work up to date includes EGD, CT scan of the abdomen, ultrasound times 2. Although these studies failed to show any etiology of her pain. She's had several ER visit since been multitude medications empirically without much improvement. She was due to see Dr. Rosana Hoes back at the end of this month. Patient notes prior to her oopherectomy she would not have any problems with vomiting. The reason for the oopherectomywas a dermoid cyst. For the 1st couple weeks following the procedure she did not have problems. She continued to complain of vomiting up water and has had some electrolyte abnormalities was sent back to the emergency room and admitted.we had scheduled her for hepatobiliary skin with ejection fraction which was 9%. Patient has been using marijuana and taking multitude of medicines but none of these it helped her nausea. She is being followed by surgery. Have discussed with Dr. Hassell Done will continue to follow her suggestive formal small bowel study such a small bowel enterography or small bowel follow-through to evaluate for partial small bowel obstruction. We will follow.

## 2017-09-28 ENCOUNTER — Inpatient Hospital Stay (HOSPITAL_COMMUNITY): Payer: Medicare Other

## 2017-09-28 ENCOUNTER — Encounter (HOSPITAL_COMMUNITY): Payer: Self-pay

## 2017-09-28 LAB — COMPREHENSIVE METABOLIC PANEL
ALT: 54 U/L — AB (ref 0–44)
ANION GAP: 8 (ref 5–15)
AST: 24 U/L (ref 15–41)
Albumin: 3.3 g/dL — ABNORMAL LOW (ref 3.5–5.0)
Alkaline Phosphatase: 93 U/L (ref 38–126)
CO2: 23 mmol/L (ref 22–32)
CREATININE: 1.02 mg/dL — AB (ref 0.44–1.00)
Calcium: 9 mg/dL (ref 8.9–10.3)
Chloride: 110 mmol/L (ref 98–111)
GFR calc Af Amer: >60 mL/min (ref >60)
GFR calc non Af Amer: >60 mL/min (ref >60)
Glucose, Bld: 107 mg/dL — ABNORMAL HIGH (ref 70–99)
Potassium: 4.2 mmol/L (ref 3.5–5.1)
Sodium: 141 mmol/L (ref 135–145)
Total Bilirubin: 0.4 mg/dL (ref 0.3–1.2)
Total Protein: 6.8 g/dL (ref 6.5–8.1)

## 2017-09-28 LAB — BASIC METABOLIC PANEL
Anion gap: 8 (ref 5–15)
BUN: 5 mg/dL — ABNORMAL LOW (ref 6–20)
CHLORIDE: 106 mmol/L (ref 98–111)
CO2: 25 mmol/L (ref 22–32)
CREATININE: 1.05 mg/dL — AB (ref 0.44–1.00)
Calcium: 8.7 mg/dL — ABNORMAL LOW (ref 8.9–10.3)
GFR calc non Af Amer: >60 mL/min (ref >60)
Glucose, Bld: 123 mg/dL — ABNORMAL HIGH (ref 70–99)
POTASSIUM: 3.6 mmol/L (ref 3.5–5.1)
SODIUM: 139 mmol/L (ref 135–145)

## 2017-09-28 MED ORDER — IOHEXOL 300 MG/ML  SOLN
100.0000 mL | Freq: Once | INTRAMUSCULAR | Status: AC | PRN
  Administered 2017-09-28: 100 mL via INTRAVENOUS

## 2017-09-28 NOTE — Progress Notes (Signed)
PROGRESS NOTE    Haley Freeman   TMH:[id number]  DOB: 1975/08/20  DOA: 09/26/2017 PCP: [facility]   Brief Narrative:  Haley Freeman is a 42 y.o. female with medical history significant of prior history of alcohol abuse but nothing recently, marijuana use, anxiety, presents to the hospital with intractable nausea and vomiting for about 1 month now with a 20 lb weight loss. Recent EGD unrevealing.    Subjective: Tolerating clears without abd pain or vomiting today.     Assessment & Plan:   Principal Problem:   Intractable nausea and vomiting with leukocytosis   Elevated LFTs   Abnormal biliary HIDA scan - gen sugery consulted - further work up as above-  - Hepatitis panel negative - ongoing work up by GI and Gen surgery - CT enterogram negative - head CT to r/o mass negative - clear liquids- cont IVF  Active Problems:   Hypokalemia - replacing    Marijuana abuse  - per history and noted on UDS  DVT prophylaxis: Lovenox Code Status: Full code Family Communication:  Disposition Plan: home when stable Consultants:   GI  Gen sugery Procedures:  Antimicrobials:  Anti-infectives (From admission, onward)   None       Objective: Vitals:   09/27/17 0439 09/27/17 1409 09/27/17 2022 09/28/17 0500  BP: (!) 154/82 121/87 109/82 117/72  Pulse: 90 76 82 85  Resp: 18 19 18 15   Temp: 98.5 F (36.9 C) 97.9 F (36.6 C) 98.2 F (36.8 C) 98.4 F (36.9 C)  TempSrc: Oral Oral Oral Oral  SpO2: 97% 100% 100% 100%  Weight:      Height:        Intake/Output Summary (Last 24 hours) at 09/28/2017 1154 Last data filed at 09/28/2017 1000 Gross per 24 hour  Intake 2624.21 ml  Output -  Net 2624.21 ml   Filed Weights   09/26/17 1002  Weight: 133.8 kg    Examination: General exam: Appears comfortable  HEENT: PERRLA, oral mucosa moist, no sclera icterus or thrush Respiratory system: Clear to auscultation. Respiratory effort  normal. Cardiovascular system: S1 & S2 heard, RRR.   Gastrointestinal system: Abdomen soft,  Mildly tender across upper abdomen, nondistended. Normal bowel sounds No organomegaly Central nervous system: Alert and oriented. No focal neurological deficits. Extremities: No cyanosis, clubbing or edema Skin: No rashes or ulcers Psychiatry:  Mood & affect appropriate.     Data Reviewed: I have personally reviewed following labs and imaging studies  CBC: Recent Labs  Lab 09/21/17 1622 09/25/17 0909 09/26/17 1119 09/27/17 0614  WBC 16.0* 17.3* 16.2* 13.7*  NEUTROABS 13.3* 11.9*  --   --   HGB 10.6* 11.9* 10.7* 10.1*  HCT 34.6* 38.8 35.0* 33.7*  MCV 70.6* 70.5* 70.3* 72.8*  PLT 352 370 343 921   Basic Metabolic Panel: Recent Labs  Lab 09/21/17 1622 09/25/17 0909 09/26/17 1119 09/27/17 0614 09/28/17 0534  NA 141 138 139 140 139  K 3.2* 3.2* 3.4* 3.9 3.6  CL 108 97* 102 106 106  CO2 21* 29 26 28 25   GLUCOSE 128* 125* 113* 103* 123*  BUN 8 12 10 7  5*  CREATININE 1.03* 1.28* 1.29* 1.12* 1.05*  CALCIUM 9.4 9.7 9.5 8.7* 8.7*   GFR: Estimated Creatinine Clearance: 101.3 mL/min (A) (by C-G formula based on SCr of 1.05 mg/dL (H)). Liver Function Tests: Recent Labs  Lab 09/21/17 1622 09/26/17 1119 09/27/17 0614  AST 22 90* 43*  ALT 19 122* 82*  ALKPHOS  96 107 92  BILITOT 0.4 0.6 0.5  PROT 7.6 7.8 6.4*  ALBUMIN 3.6 4.0 3.1*   Recent Labs  Lab 09/21/17 1622 09/26/17 1119  LIPASE 39 28   No results for input(s): AMMONIA in the last 168 hours. Coagulation Profile: No results for input(s): INR, PROTIME in the last 168 hours. Cardiac Enzymes: No results for input(s): CKTOTAL, CKMB, CKMBINDEX, TROPONINI in the last 168 hours. BNP (last 3 results) No results for input(s): PROBNP in the last 8760 hours. HbA1C: No results for input(s): HGBA1C in the last 72 hours. CBG: No results for input(s): GLUCAP in the last 168 hours. Lipid Profile: No results for input(s): CHOL,  HDL, LDLCALC, TRIG, CHOLHDL, LDLDIRECT in the last 72 hours. Thyroid Function Tests: No results for input(s): TSH, T4TOTAL, FREET4, T3FREE, THYROIDAB in the last 72 hours. Anemia Panel: No results for input(s): VITAMINB12, FOLATE, FERRITIN, TIBC, IRON, RETICCTPCT in the last 72 hours. Urine analysis:    Component Value Date/Time   COLORURINE YELLOW 09/26/2017 1021   APPEARANCEUR CLOUDY (A) 09/26/2017 1021   LABSPEC 1.016 09/26/2017 1021   PHURINE 6.0 09/26/2017 1021   GLUCOSEU NEGATIVE 09/26/2017 1021   HGBUR SMALL (A) 09/26/2017 1021   BILIRUBINUR NEGATIVE 09/26/2017 1021   KETONESUR NEGATIVE 09/26/2017 1021   PROTEINUR 100 (A) 09/26/2017 1021   UROBILINOGEN 0.2 09/22/2014 0730   NITRITE NEGATIVE 09/26/2017 1021   LEUKOCYTESUR LARGE (A) 09/26/2017 1021   Sepsis Labs: @LABRCNTIP (procalcitonin:4,lacticidven:4) )No results found for this or any previous visit (from the past 240 hour(s)).       Radiology Studies: Ct Head W & Wo Contrast  Result Date: 09/28/2017 CLINICAL DATA:  42 year old female with unexplained nausea vomiting. EXAM: CT HEAD WITHOUT AND WITH CONTRAST TECHNIQUE: Contiguous axial images were obtained from the base of the skull through the vertex without and with intravenous contrast CONTRAST:  160mL OMNIPAQUE IOHEXOL 300 MG/ML  SOLN COMPARISON:  Report of neck CT 03/18/2001 (no images available). FINDINGS: Brain: Normal cerebral volume. Possible partially empty sella. No midline shift, ventriculomegaly, mass effect, evidence of mass lesion, intracranial hemorrhage or evidence of cortically based acute infarction. Gray-white matter differentiation is within normal limits throughout the brain. No abnormal enhancement identified. Vascular: The major intracranial vascular structures appear to be enhancing and patent. Skull: Negative. Sinuses/Orbits: Partially visible maxillary mucoperiosteal thickening. Other paranasal sinuses and mastoids are well pneumatized. Other:  Visualized orbit soft tissues are within normal limits. Visualized scalp soft tissues are within normal limits. IMPRESSION: Normal CT appearance of the brain aside from a possible partially empty sella, which is often a normal anatomic variant but can be associated with idiopathic intracranial hypertension (pseudotumor cerebri) Electronically Signed   By: Genevie Ann M.D.   On: 09/28/2017 10:45   Nm Hepato W/eject Fract  Result Date: 09/26/2017 CLINICAL DATA:  Nausea, vomiting EXAM: NUCLEAR MEDICINE HEPATOBILIARY IMAGING WITH GALLBLADDER EF TECHNIQUE: Sequential images of the abdomen were obtained out to 60 minutes following intravenous administration of radiopharmaceutical. After oral ingestion of Ensure, gallbladder ejection fraction was determined. At 60 min, normal ejection fraction is greater than 33%. RADIOPHARMACEUTICALS:  5.3 mCi Tc-33m  Choletec IV COMPARISON:  None. FINDINGS: Prompt uptake and biliary excretion of activity by the liver is seen. Gallbladder activity is visualized, consistent with patency of cystic duct. Biliary activity passes into small bowel, consistent with patent common bile duct. Calculated gallbladder ejection fraction is 9.2%. (Normal gallbladder ejection fraction with Ensure is greater than 33%.) IMPRESSION: No evidence of cystic duct or common bile  duct obstruction. Low gallbladder ejection fraction, 9%. Electronically Signed   By: Rolm Baptise M.D.   On: 09/26/2017 16:06   Ct Entero Abd/pelvis W Contast  Result Date: 09/27/2017 CLINICAL DATA:  Upper abdominal pain, nausea, weight loss EXAM: CT ABDOMEN AND PELVIS WITH CONTRAST (ENTEROGRAPHY) TECHNIQUE: Multidetector CT of the abdomen and pelvis during bolus administration of intravenous contrast. Negative oral contrast was given. CONTRAST:  13mL OMNIPAQUE IOHEXOL 300 MG/ML  SOLN COMPARISON:  Multiple priors, most recently CT abdomen/pelvis dated 09/03/2017 FINDINGS: Lower chest:  Lung bases are clear. Hepatobiliary: Liver is  notable for a 1.8 cm hemangioma in segment 7 (series 3/image 15). Gallbladder is unremarkable. No intrahepatic or extrahepatic ductal dilatation. Pancreas: Within normal limits. Spleen: Within normal limits. Adrenals/Urinary Tract: Adrenal glands are within normal limits. Kidneys are within normal limits. Partially duplicated left renal collecting system (series 9/image 30). No hydronephrosis. Bladder is within normal limits. Stomach/Bowel: Stomach is within normal limits. No evidence of bowel obstruction. Normal appendix (series 3/image 69). Vascular/Lymphatic: No evidence of abdominal aortic aneurysm. Circumaortic left renal vein. No suspicious abdominopelvic lymphadenopathy. Reproductive: Uterus is mildly heterogeneous, likely reflecting uterine fibroids, better evaluated ultrasound. Bilateral ovaries are within normal limits. Again noted is prior right ovarian dermoid resection. Other: No abdominopelvic ascites. Tiny fat containing periumbilical hernia. Musculoskeletal: Visualized osseous structures are within normal limits. IMPRESSION: No CT findings to account for the patient's abdominal pain. Stable ancillary findings as above. Electronically Signed   By: Julian Hy M.D.   On: 09/27/2017 13:28      Scheduled Meds: . barium  450 mL Oral Once  . enoxaparin (LOVENOX) injection  40 mg Subcutaneous Q24H  . metoCLOPramide (REGLAN) injection  5 mg Intravenous Q8H  . pantoprazole  40 mg Oral Daily  . traZODone  300 mg Oral QHS   Continuous Infusions: . 0.9 % NaCl with KCl 40 mEq / L 100 mL/hr (09/28/17 1139)     LOS: 1 day    Time spent in minutes: [place], MD [facility] Pager: [email or web address] Password [facility] 09/28/2017, 11:54 AM

## 2017-09-28 NOTE — Progress Notes (Signed)
[facility] PROGRESS NOTE Subjective Patient feels better is tolerating clear liquids.  She still nauseated and describes whenever she eats she feels chest pain and pressure seems to be doing better with that now that she is receiving pain medications.  Small bowel enterography showed no signs of SBO.The patient denies headaches visual problems or any other neurological symptoms.  Objective: Vital signs in last 24 hours: Temp:  [97.9 F (36.6 C)-98.4 F (36.9 C)] 98.4 F (36.9 C) (09/22 0500) Pulse Rate:  [76-85] 85 (09/22 0500) Resp:  [15-19] 15 (09/22 0500) BP: (109-121)/(72-87) 117/72 (09/22 0500) SpO2:  [100 %] 100 % (09/22 0500) Last BM Date: 09/27/17  Intake/Output from previous day: 09/21 0701 - 09/22 0700 In: 2264.2 [P.O.:480; I.V.:1784.2] Out: -  Intake/Output this shift: No intake/output data recorded.  PE: General--no acute distress  Abdomen--nontender  Lab Results: Recent Labs    09/26/17 1119 09/27/17 0614  WBC 16.2* 13.7*  HGB 10.7* 10.1*  HCT 35.0* 33.7*  PLT 343 265   BMET Recent Labs    09/26/17 1119 09/27/17 0614 09/28/17 0534  NA 139 140 139  K 3.4* 3.9 3.6  CL 102 106 106  CO2 26 28 25   CREATININE 1.29* 1.12* 1.05*   LFT Recent Labs    09/26/17 1119 09/27/17 0614  PROT 7.8 6.4*  AST 90* 43*  ALT 122* 82*  ALKPHOS 107 92  BILITOT 0.6 0.5   PT/INR No results for input(s): LABPROT, INR in the last 72 hours. PANCREAS Recent Labs    09/26/17 1119  LIPASE 28         Studies/Results: Nm Hepato W/eject Fract  Result Date: 09/26/2017 CLINICAL DATA:  Nausea, vomiting EXAM: NUCLEAR MEDICINE HEPATOBILIARY IMAGING WITH GALLBLADDER EF TECHNIQUE: Sequential images of the abdomen were obtained out to 60 minutes following intravenous administration of radiopharmaceutical. After oral ingestion of Ensure, gallbladder ejection fraction was determined. At 60 min, normal ejection fraction is greater than 33%. RADIOPHARMACEUTICALS:   5.3 mCi Tc-21m  Choletec IV COMPARISON:  None. FINDINGS: Prompt uptake and biliary excretion of activity by the liver is seen. Gallbladder activity is visualized, consistent with patency of cystic duct. Biliary activity passes into small bowel, consistent with patent common bile duct. Calculated gallbladder ejection fraction is 9.2%. (Normal gallbladder ejection fraction with Ensure is greater than 33%.) IMPRESSION: No evidence of cystic duct or common bile duct obstruction. Low gallbladder ejection fraction, 9%. Electronically Signed   By: Rolm Baptise M.D.   On: 09/26/2017 16:06   Ct Entero Abd/pelvis W Contast  Result Date: 09/27/2017 CLINICAL DATA:  Upper abdominal pain, nausea, weight loss EXAM: CT ABDOMEN AND PELVIS WITH CONTRAST (ENTEROGRAPHY) TECHNIQUE: Multidetector CT of the abdomen and pelvis during bolus administration of intravenous contrast. Negative oral contrast was given. CONTRAST:  130mL OMNIPAQUE IOHEXOL 300 MG/ML  SOLN COMPARISON:  Multiple priors, most recently CT abdomen/pelvis dated 09/03/2017 FINDINGS: Lower chest:  Lung bases are clear. Hepatobiliary: Liver is notable for a 1.8 cm hemangioma in segment 7 (series 3/image 15). Gallbladder is unremarkable. No intrahepatic or extrahepatic ductal dilatation. Pancreas: Within normal limits. Spleen: Within normal limits. Adrenals/Urinary Tract: Adrenal glands are within normal limits. Kidneys are within normal limits. Partially duplicated left renal collecting system (series 9/image 30). No hydronephrosis. Bladder is within normal limits. Stomach/Bowel: Stomach is within normal limits. No evidence of bowel obstruction. Normal appendix (series 3/image 69). Vascular/Lymphatic: No evidence of abdominal aortic aneurysm. Circumaortic left renal vein. No suspicious abdominopelvic lymphadenopathy. Reproductive: Uterus is mildly heterogeneous, likely  reflecting uterine fibroids, better evaluated ultrasound. Bilateral ovaries are within normal limits.  Again noted is prior right ovarian dermoid resection. Other: No abdominopelvic ascites. Tiny fat containing periumbilical hernia. Musculoskeletal: Visualized osseous structures are within normal limits. IMPRESSION: No CT findings to account for the patient's abdominal pain. Stable ancillary findings as above. Electronically Signed   By: Julian Hy M.D.   On: 09/27/2017 13:28    Medications: I have reviewed the patient's current medications.  Assessment:   1.N+V.  The only abnormality on multiple studies has been the low ejection fraction of the gallbladder.  She also gets chest pain with eating which is now better with morphine.  This could go along with gallbladder dysfunction.  The unusual thing is that all this started after her GYN surgery for the dermoid cyst but this could have been unrelated.  Occult brain tumors and other neurological problems can cause severe nausea which she has no headaches, neurological symptoms etc.   Plan: 1.  Surgery is following.  I think we will need to make a decision whether or not to just go ahead with a laparoscopic cholecystectomy or not.  Since this all started after GYN surgery this would also allow evaluation in that area.   Nancy Fetter 09/28/2017, 9:29 AM  This note was created using voice recognition software. Minor errors may Have occurred unintentionally.  Pager: [phone] If no answer or after hours call [phone]

## 2017-09-28 NOTE — Progress Notes (Signed)
Patient ID: Haley Freeman, female   DOB: [birth year], 42 y.o.   MRN: [id number] [facility] Surgery Progress Note:   * No surgery found *  Subjective: Mental status is pleasant;  No complaints today.  We discussed her symptoms. Objective: Vital signs in last 24 hours: Temp:  [97.9 F (36.6 C)-98.4 F (36.9 C)] 98.4 F (36.9 C) (09/22 0500) Pulse Rate:  [76-85] 85 (09/22 0500) Resp:  [15-19] 15 (09/22 0500) BP: (109-121)/(72-87) 117/72 (09/22 0500) SpO2:  [100 %] 100 % (09/22 0500)  Intake/Output from previous day: 09/21 0701 - 09/22 0700 In: 2264.2 [P.O.:480; I.V.:1784.2] Out: -  Intake/Output this shift: No intake/output data recorded.  Physical Exam: Work of breathing is OK.  No abdominal pain   Lab Results:  Results for orders placed or performed during the hospital encounter of 09/26/17 (from the past 48 hour(s))  Urinalysis, Routine w reflex microscopic     Status: Abnormal   Collection Time: 09/26/17 10:21 AM  Result Value Ref Range   Color, Urine YELLOW YELLOW   APPearance CLOUDY (A) CLEAR   Specific Gravity, Urine 1.016 1.005 - 1.030   pH 6.0 5.0 - 8.0   Glucose, UA NEGATIVE NEGATIVE mg/dL   Hgb urine dipstick SMALL (A) NEGATIVE   Bilirubin Urine NEGATIVE NEGATIVE   Ketones, ur NEGATIVE NEGATIVE mg/dL   Protein, ur 100 (A) NEGATIVE mg/dL   Nitrite NEGATIVE NEGATIVE   Leukocytes, UA LARGE (A) NEGATIVE   RBC / HPF 21-50 0 - 5 RBC/hpf   WBC, UA >50 (H) 0 - 5 WBC/hpf   Bacteria, UA RARE (A) NONE SEEN   Squamous Epithelial / LPF 0-5 0 - 5   Mucus PRESENT    Hyaline Casts, UA PRESENT    Non Squamous Epithelial 6-10 (A) NONE SEEN    Comment: Performed at [facility], [place] [address] [place], [place] [address]  Rapid urine drug screen (hospital performed)     Status: Abnormal   Collection Time: 09/26/17 10:21 AM  Result Value Ref Range   Opiates POSITIVE (A) NONE DETECTED   Cocaine NONE DETECTED NONE DETECTED   Benzodiazepines NONE  DETECTED NONE DETECTED   Amphetamines POSITIVE (A) NONE DETECTED   Tetrahydrocannabinol POSITIVE (A) NONE DETECTED   Barbiturates POSITIVE (A) NONE DETECTED    Comment: (NOTE) DRUG SCREEN FOR MEDICAL PURPOSES ONLY.  IF CONFIRMATION IS NEEDED FOR ANY PURPOSE, NOTIFY LAB WITHIN 5 DAYS. LOWEST DETECTABLE LIMITS FOR URINE DRUG SCREEN Drug Class                     Cutoff (ng/mL) Amphetamine and metabolites    1000 Barbiturate and metabolites    200 Benzodiazepine                 497 Tricyclics and metabolites     300 Opiates and metabolites        300 Cocaine and metabolites        300 THC                            50 Performed at [facility], [place] [address], [place], [place] [address]   hCG, quantitative, pregnancy     Status: None   Collection Time: 09/26/17 10:21 AM  Result Value Ref Range   hCG, Beta Chain, Quant, S <1 <5 mIU/mL    Comment:          GEST. AGE  CONC.  (mIU/mL)   <=1 WEEK        5 - 50     2 WEEKS       50 - 500     3 WEEKS       100 - 10,000     4 WEEKS     1,000 - 30,000     5 WEEKS     3,500 - 115,000   6-8 WEEKS     12,000 - 270,000    12 WEEKS     15,000 - 220,000        FEMALE AND NON-PREGNANT FEMALE:     LESS THAN 5 mIU/mL Performed at [facility], [place] [address] [place], [place] [address]   I-Stat beta hCG blood, ED     Status: Abnormal   Collection Time: 09/26/17 10:55 AM  Result Value Ref Range   I-stat hCG, quantitative 5.2 (H) <5 mIU/mL   Comment 3            Comment:   GEST. AGE      CONC.  (mIU/mL)   <=1 WEEK        5 - 50     2 WEEKS       50 - 500     3 WEEKS       100 - 10,000     4 WEEKS     1,000 - 30,000        FEMALE AND NON-PREGNANT FEMALE:     LESS THAN 5 mIU/mL   Lipase, blood     Status: None   Collection Time: 09/26/17 11:19 AM  Result Value Ref Range   Lipase 28 11 - 51 U/L    Comment: Performed at [facility], [place] [address], [place], [place] [address]   Comprehensive metabolic panel     Status: Abnormal   Collection Time: 09/26/17 11:19 AM  Result Value Ref Range   Sodium 139 135 - 145 mmol/L   Potassium 3.4 (L) 3.5 - 5.1 mmol/L   Chloride 102 98 - 111 mmol/L   CO2 26 22 - 32 mmol/L   Glucose, Bld 113 (H) 70 - 99 mg/dL   BUN 10 6 - 20 mg/dL   Creatinine, Ser 1.29 (H) 0.44 - 1.00 mg/dL   Calcium 9.5 8.9 - 10.3 mg/dL   Total Protein 7.8 6.5 - 8.1 g/dL   Albumin 4.0 3.5 - 5.0 g/dL   AST 90 (H) 15 - 41 U/L   ALT 122 (H) 0 - 44 U/L   Alkaline Phosphatase 107 38 - 126 U/L   Total Bilirubin 0.6 0.3 - 1.2 mg/dL   GFR calc non Af Amer 50 (L) >60 mL/min   GFR calc Af Amer 58 (L) >60 mL/min    Comment: (NOTE) The eGFR has been calculated using the CKD EPI equation. This calculation has not been validated in all clinical situations. eGFR's persistently <60 mL/min signify possible Chronic Kidney Disease.    Anion gap 11 5 - 15    Comment: Performed at [facility], [place] [address] [place], [place] [address]  CBC     Status: Abnormal   Collection Time: 09/26/17 11:19 AM  Result Value Ref Range   WBC 16.2 (H) 4.0 - 10.5 K/uL   RBC 4.98 3.87 - 5.11 MIL/uL   Hemoglobin 10.7 (L) 12.0 - 15.0 g/dL   HCT 35.0 (L) 36.0 - 46.0 %   MCV 70.3 (L) 78.0 -  100.0 fL   MCH 21.5 (L) 26.0 - 34.0 pg   MCHC 30.6 30.0 - 36.0 g/dL   RDW 18.2 (H) 11.5 - 15.5 %   Platelets 343 150 - 400 K/uL    Comment: Performed at [facility], [place] [address] [place], [place] [address]  Hepatitis panel, acute     Status: None   Collection Time: 09/26/17  4:03 PM  Result Value Ref Range   Hepatitis B Surface Ag Negative Negative   HCV Ab <0.1 0.0 - 0.9 s/co ratio    Comment: (NOTE)                                  Negative:     < 0.8                             Indeterminate: 0.8 - 0.9                                  Positive:     > 0.9 The CDC recommends that a positive HCV antibody result be followed up with a HCV Nucleic Acid  Amplification test (606301). Performed At: [facility] [place], [place] [id number] Rush Farmer MD [id number]    Hep A IgM Negative Negative   Hep B C IgM Negative Negative  HIV antibody (Routine Testing)     Status: None   Collection Time: 09/26/17  4:03 PM  Result Value Ref Range   HIV Screen 4th Generation wRfx Non Reactive Non Reactive    Comment: (NOTE) Performed At: [facility] [place], [place] [id number] Rush Farmer MD [id number]   Comprehensive metabolic panel     Status: Abnormal   Collection Time: 09/27/17  6:14 AM  Result Value Ref Range   Sodium 140 135 - 145 mmol/L   Potassium 3.9 3.5 - 5.1 mmol/L   Chloride 106 98 - 111 mmol/L   CO2 28 22 - 32 mmol/L   Glucose, Bld 103 (H) 70 - 99 mg/dL   BUN 7 6 - 20 mg/dL   Creatinine, Ser 1.12 (H) 0.44 - 1.00 mg/dL   Calcium 8.7 (L) 8.9 - 10.3 mg/dL   Total Protein 6.4 (L) 6.5 - 8.1 g/dL   Albumin 3.1 (L) 3.5 - 5.0 g/dL   AST 43 (H) 15 - 41 U/L   ALT 82 (H) 0 - 44 U/L   Alkaline Phosphatase 92 38 - 126 U/L   Total Bilirubin 0.5 0.3 - 1.2 mg/dL   GFR calc non Af Amer 60 (L) >60 mL/min   GFR calc Af Amer >60 >60 mL/min    Comment: (NOTE) The eGFR has been calculated using the CKD EPI equation. This calculation has not been validated in all clinical situations. eGFR's persistently <60 mL/min signify possible Chronic Kidney Disease.    Anion gap 6 5 - 15    Comment: Performed at [facility], [place] [address] [place], [place] [address]  CBC     Status: Abnormal   Collection Time: 09/27/17  6:14 AM  Result Value Ref Range   WBC 13.7 (H) 4.0 - 10.5 K/uL   RBC 4.63 3.87 - 5.11 MIL/uL   Hemoglobin 10.1 (L) 12.0 - 15.0 g/dL   HCT 33.7 (L) 36.0 -  46.0 %   MCV 72.8 (L) 78.0 - 100.0 fL   MCH 21.8 (L) 26.0 - 34.0 pg   MCHC 30.0 30.0 - 36.0 g/dL   RDW 18.7 (H) 11.5 - 15.5 %   Platelets 265 150 - 400 K/uL    Comment: SPECIMEN CHECKED FOR  CLOTS REPEATED TO VERIFY Performed at [facility], [place] [address] [place], [place] [address]   Urine rapid drug screen (hosp performed)     Status: Abnormal   Collection Time: 09/27/17  9:56 AM  Result Value Ref Range   Opiates POSITIVE (A) NONE DETECTED   Cocaine NONE DETECTED NONE DETECTED   Benzodiazepines NONE DETECTED NONE DETECTED   Amphetamines NONE DETECTED NONE DETECTED   Tetrahydrocannabinol POSITIVE (A) NONE DETECTED   Barbiturates NONE DETECTED NONE DETECTED    Comment: (NOTE) DRUG SCREEN FOR MEDICAL PURPOSES ONLY.  IF CONFIRMATION IS NEEDED FOR ANY PURPOSE, NOTIFY LAB WITHIN 5 DAYS. LOWEST DETECTABLE LIMITS FOR URINE DRUG SCREEN Drug Class                     Cutoff (ng/mL) Amphetamine and metabolites    1000 Barbiturate and metabolites    200 Benzodiazepine                 948 Tricyclics and metabolites     300 Opiates and metabolites        300 Cocaine and metabolites        300 THC                            50 Performed at [facility], [place] [address] [place], [place] [address]   Basic metabolic panel     Status: Abnormal   Collection Time: 09/28/17  5:34 AM  Result Value Ref Range   Sodium 139 135 - 145 mmol/L   Potassium 3.6 3.5 - 5.1 mmol/L   Chloride 106 98 - 111 mmol/L   CO2 25 22 - 32 mmol/L   Glucose, Bld 123 (H) 70 - 99 mg/dL   BUN 5 (L) 6 - 20 mg/dL   Creatinine, Ser 1.05 (H) 0.44 - 1.00 mg/dL   Calcium 8.7 (L) 8.9 - 10.3 mg/dL   GFR calc non Af Amer >60 >60 mL/min   GFR calc Af Amer >60 >60 mL/min    Comment: (NOTE) The eGFR has been calculated using the CKD EPI equation. This calculation has not been validated in all clinical situations. eGFR's persistently <60 mL/min signify possible Chronic Kidney Disease.    Anion gap 8 5 - 15    Comment: Performed at [facility], [place] [address], [place], [place] [address]    Radiology/Results: Nm Hepato W/eject Fract  Result Date:  09/26/2017 CLINICAL DATA:  Nausea, vomiting EXAM: NUCLEAR MEDICINE HEPATOBILIARY IMAGING WITH GALLBLADDER EF TECHNIQUE: Sequential images of the abdomen were obtained out to 60 minutes following intravenous administration of radiopharmaceutical. After oral ingestion of Ensure, gallbladder ejection fraction was determined. At 60 min, normal ejection fraction is greater than 33%. RADIOPHARMACEUTICALS:  5.3 mCi Tc-96m Choletec IV COMPARISON:  None. FINDINGS: Prompt uptake and biliary excretion of activity by the liver is seen. Gallbladder activity is visualized, consistent with patency of cystic duct. Biliary activity passes into small bowel, consistent with patent common bile duct. Calculated gallbladder ejection fraction is 9.2%. (Normal gallbladder ejection fraction with Ensure is greater than 33%.) IMPRESSION: No evidence of  cystic duct or common bile duct obstruction. Low gallbladder ejection fraction, 9%. Electronically Signed   By: Rolm Baptise M.D.   On: 09/26/2017 16:06   Ct Entero Abd/pelvis W Contast  Result Date: 09/27/2017 CLINICAL DATA:  Upper abdominal pain, nausea, weight loss EXAM: CT ABDOMEN AND PELVIS WITH CONTRAST (ENTEROGRAPHY) TECHNIQUE: Multidetector CT of the abdomen and pelvis during bolus administration of intravenous contrast. Negative oral contrast was given. CONTRAST:  135m OMNIPAQUE IOHEXOL 300 MG/ML  SOLN COMPARISON:  Multiple priors, most recently CT abdomen/pelvis dated 09/03/2017 FINDINGS: Lower chest:  Lung bases are clear. Hepatobiliary: Liver is notable for a 1.8 cm hemangioma in segment 7 (series 3/image 15). Gallbladder is unremarkable. No intrahepatic or extrahepatic ductal dilatation. Pancreas: Within normal limits. Spleen: Within normal limits. Adrenals/Urinary Tract: Adrenal glands are within normal limits. Kidneys are within normal limits. Partially duplicated left renal collecting system (series 9/image 30). No hydronephrosis. Bladder is within normal limits.  Stomach/Bowel: Stomach is within normal limits. No evidence of bowel obstruction. Normal appendix (series 3/image 69). Vascular/Lymphatic: No evidence of abdominal aortic aneurysm. Circumaortic left renal vein. No suspicious abdominopelvic lymphadenopathy. Reproductive: Uterus is mildly heterogeneous, likely reflecting uterine fibroids, better evaluated ultrasound. Bilateral ovaries are within normal limits. Again noted is prior right ovarian dermoid resection. Other: No abdominopelvic ascites. Tiny fat containing periumbilical hernia. Musculoskeletal: Visualized osseous structures are within normal limits. IMPRESSION: No CT findings to account for the patient's abdominal pain. Stable ancillary findings as above. Electronically Signed   By: SJulian HyM.D.   On: 09/27/2017 13:28    Anti-infectives: Anti-infectives (From admission, onward)   None      Assessment/Plan: Problem List: Patient Active Problem List   Diagnosis Date Noted  . Hypokalemia 09/27/2017  . Elevated LFTs 09/27/2017  . Abnormal biliary HIDA scan 09/27/2017  . Marijuana abuse 09/27/2017  . Intractable nausea and vomiting 09/26/2017  . Cannabis hyperemesis syndrome concurrent with and due to cannabis abuse ([facility] 09/16/2017  . Nausea and vomiting 09/06/2017  . 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  No partial SBO in the pelvis.  Her symptoms sound like motility problems--she eats and feels like a "brick" is sitting up in her upper abdomen and then she has pain and vomits.  Motility would explain her 9% ejection fraction on the HIDA.  It may come to cholecystectomy and will let Dr. BBarry Dienessort through this.   * No surgery found *    LOS: 1 day   Matt B. MHassell Done MD, [facility] [phone] [phone] 09/28/2017 8:45 AM

## 2017-09-29 LAB — CBC
HEMATOCRIT: 32.3 % — AB (ref 36.0–46.0)
Hemoglobin: 9.8 g/dL — ABNORMAL LOW (ref 12.0–15.0)
MCH: 21.9 pg — ABNORMAL LOW (ref 26.0–34.0)
MCHC: 30.3 g/dL (ref 30.0–36.0)
MCV: 72.3 fL — AB (ref 78.0–100.0)
PLATELETS: 279 10*3/uL (ref 150–400)
RBC: 4.47 MIL/uL (ref 3.87–5.11)
RDW: 18.5 % — AB (ref 11.5–15.5)
WBC: 11.7 10*3/uL — ABNORMAL HIGH (ref 4.0–10.5)

## 2017-09-29 MED ORDER — ACETAMINOPHEN 325 MG PO TABS: 650.0000 mg | ORAL_TABLET | Freq: Four times a day (QID) | ORAL | Status: DC | PRN

## 2017-09-29 MED ORDER — HYDROCODONE-ACETAMINOPHEN 5-325 MG PO TABS
1.0000 | ORAL_TABLET | ORAL | Status: DC | PRN
  Administered 2017-09-30: 1 via ORAL
  Filled 2017-09-29: qty 1

## 2017-09-29 MED ORDER — MORPHINE SULFATE (PF) 2 MG/ML IV SOLN
2.0000 mg | INTRAVENOUS | Status: DC | PRN
  Administered 2017-09-29 – 2017-09-30 (×5): 2 mg via INTRAVENOUS
  Filled 2017-09-29 (×5): qty 1

## 2017-09-29 MED ORDER — SODIUM CHLORIDE 0.9 % IV SOLN
INTRAVENOUS | Status: DC
  Administered 2017-09-29 – 2017-10-01 (×5): via INTRAVENOUS

## 2017-09-29 NOTE — Progress Notes (Signed)
[facility]/Trauma Progress Note      Assessment/Plan Principal Problem:   Intractable nausea and vomiting Active Problems:   Hypokalemia   Elevated LFTs   Abnormal biliary HIDA scan   Marijuana abuse   Abdominal pain/nausea/vomiting - HIDA showed low gallbladder ejection fraction 9% - pt has not had pain since admission and on clears - she may benefit from a cholecystectomy although no guarentee it will resolve all of her symptoms  - Tbili and AST WNL, ALT slightly elevated at, alk phos WNL - WBC 11.7  FEN: CLD VTE: SCD's, lovenox ID: none Foley: none Follow up: TBD  DISPO: patient may benefit from cholecystectomy will discuss with M.D.    LOS: 2 days    Subjective: CC: abdominal pain, nausea, vomiting  Patient states 3 months ago she began eating meat. Prior to that she was vegan. She only eats chicken and fish. She states one month ago she began having intermittent abdominal pain with nausea and vomiting after eating. She was able to tolerate liquids initially. Her pain progressed to where she had constant abdominal pain and was unable to even keep liquids down. Patient is currently pain on a clear liquid diet. She's not had any nausea vomiting since admission. She understands that a cholecystectomy will not guaranteed resolution of her symptoms  Objective: Vital signs in last 24 hours: Temp:  [98.2 F (36.8 C)-98.4 F (36.9 C)] 98.4 F (36.9 C) (09/23 0426) Pulse Rate:  [81-95] 92 (09/23 0426) Resp:  [18-20] 18 (09/23 0426) BP: (98-115)/(49-70) 98/49 (09/23 0426) SpO2:  [98 %-100 %] 98 % (09/23 0426) Last BM Date: 09/27/17  Intake/Output from previous day: 09/22 0701 - 09/23 0700 In: 1800 [P.O.:600; I.V.:1200] Out: 850 [Urine:850] Intake/Output this shift: No intake/output data recorded.  PE: Gen:  Alert, NAD, pleasant, cooperative Pulm:  Rate and  effort normal Abd: Soft, ND, obese, +BS, no HSM, TTP of epigastric and RUQ with guarding, no  peritonitis  Skin: no rashes noted, warm and dry   Anti-infectives: Anti-infectives (From admission, onward)   None      Lab Results:  Recent Labs    09/27/17 0614 09/29/17 0544  WBC 13.7* 11.7*  HGB 10.1* 9.8*  HCT 33.7* 32.3*  PLT 265 279   BMET Recent Labs    09/28/17 0534 09/28/17 1413  NA 139 141  K 3.6 4.2  CL 106 110  CO2 25 23  GLUCOSE 123* 107*  BUN 5* <5*  CREATININE 1.05* 1.02*  CALCIUM 8.7* 9.0   PT/INR No results for input(s): LABPROT, INR in the last 72 hours. CMP     Component Value Date/Time   NA 141 09/28/2017 1413   K 4.2 09/28/2017 1413   CL 110 09/28/2017 1413   CO2 23 09/28/2017 1413   GLUCOSE 107 (H) 09/28/2017 1413   BUN <5 (L) 09/28/2017 1413   CREATININE 1.02 (H) 09/28/2017 1413   CALCIUM 9.0 09/28/2017 1413   PROT 6.8 09/28/2017 1413   ALBUMIN 3.3 (L) 09/28/2017 1413   AST 24 09/28/2017 1413   ALT 54 (H) 09/28/2017 1413   ALKPHOS 93 09/28/2017 1413   BILITOT 0.4 09/28/2017 1413   GFRNONAA >60 09/28/2017 1413   GFRAA >60 09/28/2017 1413   Lipase     Component Value Date/Time   LIPASE 28 09/26/2017 1119    Studies/Results: Ct Head W & Wo Contrast  Result Date: 09/28/2017 CLINICAL DATA:  42 year old female with unexplained nausea vomiting. EXAM: CT HEAD WITHOUT AND WITH CONTRAST  TECHNIQUE: Contiguous axial images were obtained from the base of the skull through the vertex without and with intravenous contrast CONTRAST:  12m OMNIPAQUE IOHEXOL 300 MG/ML  SOLN COMPARISON:  Report of neck CT 03/18/2001 (no images available). FINDINGS: Brain: Normal cerebral volume. Possible partially empty sella. No midline shift, ventriculomegaly, mass effect, evidence of mass lesion, intracranial hemorrhage or evidence of cortically based acute infarction. Gray-white matter differentiation is within normal limits throughout the brain. No abnormal enhancement identified. Vascular: The major intracranial vascular structures appear to be enhancing  and patent. Skull: Negative. Sinuses/Orbits: Partially visible maxillary mucoperiosteal thickening. Other paranasal sinuses and mastoids are well pneumatized. Other: Visualized orbit soft tissues are within normal limits. Visualized scalp soft tissues are within normal limits. IMPRESSION: Normal CT appearance of the brain aside from a possible partially empty sella, which is often a normal anatomic variant but can be associated with idiopathic intracranial hypertension (pseudotumor cerebri) Electronically Signed   By: HGenevie AnnM.D.   On: 09/28/2017 10:45   Ct Entero Abd/pelvis W Contast  Result Date: 09/27/2017 CLINICAL DATA:  Upper abdominal pain, nausea, weight loss EXAM: CT ABDOMEN AND PELVIS WITH CONTRAST (ENTEROGRAPHY) TECHNIQUE: Multidetector CT of the abdomen and pelvis during bolus administration of intravenous contrast. Negative oral contrast was given. CONTRAST:  1027mOMNIPAQUE IOHEXOL 300 MG/ML  SOLN COMPARISON:  Multiple priors, most recently CT abdomen/pelvis dated 09/03/2017 FINDINGS: Lower chest:  Lung bases are clear. Hepatobiliary: Liver is notable for a 1.8 cm hemangioma in segment 7 (series 3/image 15). Gallbladder is unremarkable. No intrahepatic or extrahepatic ductal dilatation. Pancreas: Within normal limits. Spleen: Within normal limits. Adrenals/Urinary Tract: Adrenal glands are within normal limits. Kidneys are within normal limits. Partially duplicated left renal collecting system (series 9/image 30). No hydronephrosis. Bladder is within normal limits. Stomach/Bowel: Stomach is within normal limits. No evidence of bowel obstruction. Normal appendix (series 3/image 69). Vascular/Lymphatic: No evidence of abdominal aortic aneurysm. Circumaortic left renal vein. No suspicious abdominopelvic lymphadenopathy. Reproductive: Uterus is mildly heterogeneous, likely reflecting uterine fibroids, better evaluated ultrasound. Bilateral ovaries are within normal limits. Again noted is prior right  ovarian dermoid resection. Other: No abdominopelvic ascites. Tiny fat containing periumbilical hernia. Musculoskeletal: Visualized osseous structures are within normal limits. IMPRESSION: No CT findings to account for the patient's abdominal pain. Stable ancillary findings as above. Electronically Signed   By: SrJulian Hy.D.   On: 09/27/2017 13:28      JeKalman Drape PAInstitute Of Orthopaedic Surgery LLCurgery 09/29/2017, 9:16 AM  Pager: [phone]-Wed, Friday 7:00am-4:30pm Thurs 7am-11:30am  Consults: [phone]

## 2017-09-29 NOTE — Progress Notes (Signed)
PROGRESS NOTE    Haley Freeman   UXN:[id number]  DOB: April 27, 1975  DOA: 09/26/2017 [facility]CP: [facility]   Brief Narrative:  Haley Freeman is a 42 y.o. female with medical history significant of prior history of alcohol abuse but nothing recently, marijuana use, anxiety, presents to the hospital with intractable nausea and vomiting for about 1 month now with a 20 lb weight loss. Recent EGD unrevealing.    Subjective: Continues to tolerate clears without abd pain or vomiting today. She has no new complaints.    Assessment & Plan:   Principal Problem:   Intractable nausea and vomiting with leukocytosis   Elevated LFTs   Abnormal biliary HIDA scan - gen sugery consulted - further work up as above-  - Hepatitis panel negative - ongoing work up by GI and Gen surgery - CT enterogram negative - head CT to r/o mass negative - clear liquids- cont IVF - gen surgery to decide if and when to proceed with cholecystectomy  Active Problems:  Leukocytosis - ? Stress response?- improving    Hypokalemia - replacing    Marijuana abuse  - per history and noted on UDS  AOCD - stable- following  DVT prophylaxis: Lovenox Code Status: Full code Family Communication:  Disposition Plan: home when stable Consultants:   GI  Gen sugery Procedures:  Antimicrobials:  Anti-infectives (From admission, onward)   None       Objective: Vitals:   09/28/17 0500 09/28/17 1500 09/28/17 2042 09/29/17 0426  BP: 117/72 113/70 115/66 (!) 98/49  Pulse: 85 81 95 92  Resp: 15 18 20 18   Temp: 98.4 F (36.9 C) 98.2 F (36.8 C) 98.4 F (36.9 C) 98.4 F (36.9 C)  TempSrc: Oral Oral Oral Oral  SpO2: 100% 100% 99% 98%  Weight:      Height:        Intake/Output Summary (Last 24 hours) at 09/29/2017 1157 Last data filed at 09/28/2017 1800 Gross per 24 hour  Intake 1040 ml  Output 850 ml  Net 190 ml   Filed Weights   09/26/17 1002  Weight: 133.8 kg     Examination:  General exam: Appears comfortable  HEENT: PERRLA, oral mucosa moist, no sclera icterus or thrush Respiratory system: Clear to auscultation. Respiratory effort normal. Cardiovascular system: S1 & S2 heard,  No murmurs  Gastrointestinal system: Abdomen soft, mildly tender in upper abdomen, nondistended. Normal bowel sound. No organomegaly Central nervous system: Alert and oriented. No focal neurological deficits. Extremities: No cyanosis, clubbing or edema Skin: No rashes or ulcers Psychiatry:  Mood & affect appropriate.     Data Reviewed: I have personally reviewed following labs and imaging studies  CBC: Recent Labs  Lab 09/25/17 0909 09/26/17 1119 09/27/17 0614 09/29/17 0544  WBC 17.3* 16.2* 13.7* 11.7*  NEUTROABS 11.9*  --   --   --   HGB 11.9* 10.7* 10.1* 9.8*  HCT 38.8 35.0* 33.7* 32.3*  MCV 70.5* 70.3* 72.8* 72.3*  PLT 370 343 265 254   Basic Metabolic Panel: Recent Labs  Lab 09/25/17 0909 09/26/17 1119 09/27/17 0614 09/28/17 0534 09/28/17 1413  NA 138 139 140 139 141  K 3.2* 3.4* 3.9 3.6 4.2  CL 97* 102 106 106 110  CO2 29 26 28 25 23   GLUCOSE 125* 113* 103* 123* 107*  BUN 12 10 7  5* <5*  CREATININE 1.28* 1.29* 1.12* 1.05* 1.02*  CALCIUM 9.7 9.5 8.7* 8.7* 9.0   GFR: Estimated Creatinine Clearance: 104.2 mL/min (A) (  by C-G formula based on SCr of 1.02 mg/dL (H)). Liver Function Tests: Recent Labs  Lab 09/26/17 1119 09/27/17 0614 09/28/17 1413  AST 90* 43* 24  ALT 122* 82* 54*  ALKPHOS 107 92 93  BILITOT 0.6 0.5 0.4  PROT 7.8 6.4* 6.8  ALBUMIN 4.0 3.1* 3.3*   Recent Labs  Lab 09/26/17 1119  LIPASE 28   No results for input(s): AMMONIA in the last 168 hours. Coagulation Profile: No results for input(s): INR, PROTIME in the last 168 hours. Cardiac Enzymes: No results for input(s): CKTOTAL, CKMB, CKMBINDEX, TROPONINI in the last 168 hours. BNP (last 3 results) No results for input(s): PROBNP in the last 8760  hours. HbA1C: No results for input(s): HGBA1C in the last 72 hours. CBG: No results for input(s): GLUCAP in the last 168 hours. Lipid Profile: No results for input(s): CHOL, HDL, LDLCALC, TRIG, CHOLHDL, LDLDIRECT in the last 72 hours. Thyroid Function Tests: No results for input(s): TSH, T4TOTAL, FREET4, T3FREE, THYROIDAB in the last 72 hours. Anemia Panel: No results for input(s): VITAMINB12, FOLATE, FERRITIN, TIBC, IRON, RETICCTPCT in the last 72 hours. Urine analysis:    Component Value Date/Time   COLORURINE YELLOW 09/26/2017 1021   APPEARANCEUR CLOUDY (A) 09/26/2017 1021   LABSPEC 1.016 09/26/2017 1021   PHURINE 6.0 09/26/2017 1021   GLUCOSEU NEGATIVE 09/26/2017 1021   HGBUR SMALL (A) 09/26/2017 1021   BILIRUBINUR NEGATIVE 09/26/2017 1021   KETONESUR NEGATIVE 09/26/2017 1021   PROTEINUR 100 (A) 09/26/2017 1021   UROBILINOGEN 0.2 09/22/2014 0730   NITRITE NEGATIVE 09/26/2017 1021   LEUKOCYTESUR LARGE (A) 09/26/2017 1021   Sepsis Labs: @LABRCNTIP (procalcitonin:4,lacticidven:4) )No results found for this or any previous visit (from the past 240 hour(s)).       Radiology Studies: Ct Head W & Wo Contrast  Result Date: 09/28/2017 CLINICAL DATA:  42 year old female with unexplained nausea vomiting. EXAM: CT HEAD WITHOUT AND WITH CONTRAST TECHNIQUE: Contiguous axial images were obtained from the base of the skull through the vertex without and with intravenous contrast CONTRAST:  177mL OMNIPAQUE IOHEXOL 300 MG/ML  SOLN COMPARISON:  Report of neck CT 03/18/2001 (no images available). FINDINGS: Brain: Normal cerebral volume. Possible partially empty sella. No midline shift, ventriculomegaly, mass effect, evidence of mass lesion, intracranial hemorrhage or evidence of cortically based acute infarction. Gray-white matter differentiation is within normal limits throughout the brain. No abnormal enhancement identified. Vascular: The major intracranial vascular structures appear to be  enhancing and patent. Skull: Negative. Sinuses/Orbits: Partially visible maxillary mucoperiosteal thickening. Other paranasal sinuses and mastoids are well pneumatized. Other: Visualized orbit soft tissues are within normal limits. Visualized scalp soft tissues are within normal limits. IMPRESSION: Normal CT appearance of the brain aside from a possible partially empty sella, which is often a normal anatomic variant but can be associated with idiopathic intracranial hypertension (pseudotumor cerebri) Electronically Signed   By: Genevie Ann M.D.   On: 09/28/2017 10:45   Ct Entero Abd/pelvis W Contast  Result Date: 09/27/2017 CLINICAL DATA:  Upper abdominal pain, nausea, weight loss EXAM: CT ABDOMEN AND PELVIS WITH CONTRAST (ENTEROGRAPHY) TECHNIQUE: Multidetector CT of the abdomen and pelvis during bolus administration of intravenous contrast. Negative oral contrast was given. CONTRAST:  146mL OMNIPAQUE IOHEXOL 300 MG/ML  SOLN COMPARISON:  Multiple priors, most recently CT abdomen/pelvis dated 09/03/2017 FINDINGS: Lower chest:  Lung bases are clear. Hepatobiliary: Liver is notable for a 1.8 cm hemangioma in segment 7 (series 3/image 15). Gallbladder is unremarkable. No intrahepatic or extrahepatic ductal dilatation. Pancreas:  Within normal limits. Spleen: Within normal limits. Adrenals/Urinary Tract: Adrenal glands are within normal limits. Kidneys are within normal limits. Partially duplicated left renal collecting system (series 9/image 30). No hydronephrosis. Bladder is within normal limits. Stomach/Bowel: Stomach is within normal limits. No evidence of bowel obstruction. Normal appendix (series 3/image 69). Vascular/Lymphatic: No evidence of abdominal aortic aneurysm. Circumaortic left renal vein. No suspicious abdominopelvic lymphadenopathy. Reproductive: Uterus is mildly heterogeneous, likely reflecting uterine fibroids, better evaluated ultrasound. Bilateral ovaries are within normal limits. Again noted is  prior right ovarian dermoid resection. Other: No abdominopelvic ascites. Tiny fat containing periumbilical hernia. Musculoskeletal: Visualized osseous structures are within normal limits. IMPRESSION: No CT findings to account for the patient's abdominal pain. Stable ancillary findings as above. Electronically Signed   By: Julian Hy M.D.   On: 09/27/2017 13:28      Scheduled Meds: . barium  450 mL Oral Once  . enoxaparin (LOVENOX) injection  40 mg Subcutaneous Q24H  . metoCLOPramide (REGLAN) injection  5 mg Intravenous Q8H  . pantoprazole  40 mg Oral Daily  . traZODone  300 mg Oral QHS   Continuous Infusions: . 0.9 % NaCl with KCl 40 mEq / L 100 mL/hr (09/28/17 1139)     LOS: 2 days    Time spent in minutes: 35    Debbe Odea, MD [facility] Pager: [email or web address] Password [facility] 09/29/2017, 11:57 AM

## 2017-09-29 NOTE — Progress Notes (Signed)
Subjective:  The patient was seen and examined at bedside. She reports resolution of nausea and vomiting for day and a half.  Objective: Vital signs in last 24 hours: Temp:  [98.2 F (36.8 C)-98.4 F (36.9 C)] 98.4 F (36.9 C) (09/23 0426) Pulse Rate:  [81-95] 92 (09/23 0426) Resp:  [18-20] 18 (09/23 0426) BP: (98-115)/(49-70) 98/49 (09/23 0426) SpO2:  [98 %-100 %] 98 % (09/23 0426) Weight change:  Last BM Date: 09/27/17  LG:XQJJHERDEY GENERAL:not in acute distress, mild pallor, no icterus ABDOMEN:epigastric and right upper quadrant tenderness EXTREMITIES:no deformity  Lab Results: Results for orders placed or performed during the hospital encounter of 09/26/17 (from the past 48 hour(s))  Urine rapid drug screen (hosp performed)     Status: Abnormal   Collection Time: 09/27/17  9:56 AM  Result Value Ref Range   Opiates POSITIVE (A) NONE DETECTED   Cocaine NONE DETECTED NONE DETECTED   Benzodiazepines NONE DETECTED NONE DETECTED   Amphetamines NONE DETECTED NONE DETECTED   Tetrahydrocannabinol POSITIVE (A) NONE DETECTED   Barbiturates NONE DETECTED NONE DETECTED    Comment: (NOTE) DRUG SCREEN FOR MEDICAL PURPOSES ONLY.  IF CONFIRMATION IS NEEDED FOR ANY PURPOSE, NOTIFY LAB WITHIN 5 DAYS. LOWEST DETECTABLE LIMITS FOR URINE DRUG SCREEN Drug Class                     Cutoff (ng/mL) Amphetamine and metabolites    1000 Barbiturate and metabolites    200 Benzodiazepine                 814 Tricyclics and metabolites     300 Opiates and metabolites        300 Cocaine and metabolites        300 THC                            50 Performed at [facility], [place] [address] [place], [place] [address]   Basic metabolic panel     Status: Abnormal   Collection Time: 09/28/17  5:34 AM  Result Value Ref Range   Sodium 139 135 - 145 mmol/L   Potassium 3.6 3.5 - 5.1 mmol/L   Chloride 106 98 - 111 mmol/L   CO2 25 22 - 32 mmol/L   Glucose, Bld 123 (H) 70 - 99  mg/dL   BUN 5 (L) 6 - 20 mg/dL   Creatinine, Ser 1.05 (H) 0.44 - 1.00 mg/dL   Calcium 8.7 (L) 8.9 - 10.3 mg/dL   GFR calc non Af Amer >60 >60 mL/min   GFR calc Af Amer >60 >60 mL/min    Comment: (NOTE) The eGFR has been calculated using the CKD EPI equation. This calculation has not been validated in all clinical situations. eGFR's persistently <60 mL/min signify possible Chronic Kidney Disease.    Anion gap 8 5 - 15    Comment: Performed at [facility], [place] [address], [place],  [address]  Comprehensive metabolic panel     Status: Abnormal   Collection Time: 09/28/17  2:13 PM  Result Value Ref Range   Sodium 141 135 - 145 mmol/L   Potassium 4.2 3.5 - 5.1 mmol/L   Chloride 110 98 - 111 mmol/L   CO2 23 22 - 32 mmol/L   Glucose, Bld 107 (H) 70 - 99 mg/dL   BUN <5 (L) 6 - 20 mg/dL   Creatinine, Ser 1.02 (H) 0.44 -  1.00 mg/dL   Calcium 9.0 8.9 - 10.3 mg/dL   Total Protein 6.8 6.5 - 8.1 g/dL   Albumin 3.3 (L) 3.5 - 5.0 g/dL   AST 24 15 - 41 U/L   ALT 54 (H) 0 - 44 U/L   Alkaline Phosphatase 93 38 - 126 U/L   Total Bilirubin 0.4 0.3 - 1.2 mg/dL   GFR calc non Af Amer >60 >60 mL/min   GFR calc Af Amer >60 >60 mL/min    Comment: (NOTE) The eGFR has been calculated using the CKD EPI equation. This calculation has not been validated in all clinical situations. eGFR's persistently <60 mL/min signify possible Chronic Kidney Disease.    Anion gap 8 5 - 15    Comment: Performed at [facility], [place] [address] [place], [place] [address]  CBC     Status: Abnormal   Collection Time: 09/29/17  5:44 AM  Result Value Ref Range   WBC 11.7 (H) 4.0 - 10.5 K/uL   RBC 4.47 3.87 - 5.11 MIL/uL   Hemoglobin 9.8 (L) 12.0 - 15.0 g/dL   HCT 32.3 (L) 36.0 - 46.0 %   MCV 72.3 (L) 78.0 - 100.0 fL   MCH 21.9 (L) 26.0 - 34.0 pg   MCHC 30.3 30.0 - 36.0 g/dL   RDW 18.5 (H) 11.5 - 15.5 %   Platelets 279 150 - 400 K/uL    Comment: Performed at [facility], [place] [address] [place], [place] [address]    Studies/Results: Ct Head W & Wo Contrast  Result Date: 09/28/2017 CLINICAL DATA:  42 year old female with unexplained nausea vomiting. EXAM: CT HEAD WITHOUT AND WITH CONTRAST TECHNIQUE: Contiguous axial images were obtained from the base of the skull through the vertex without and with intravenous contrast CONTRAST:  148m OMNIPAQUE IOHEXOL 300 MG/ML  SOLN COMPARISON:  Report of neck CT 03/18/2001 (no images available). FINDINGS: Brain: Normal cerebral volume. Possible partially empty sella. No midline shift, ventriculomegaly, mass effect, evidence of mass lesion, intracranial hemorrhage or evidence of cortically based acute infarction. Gray-white matter differentiation is within normal limits throughout the brain. No abnormal enhancement identified. Vascular: The major intracranial vascular structures appear to be enhancing and patent. Skull: Negative. Sinuses/Orbits: Partially visible maxillary mucoperiosteal thickening. Other paranasal sinuses and mastoids are well pneumatized. Other: Visualized orbit soft tissues are within normal limits. Visualized scalp soft tissues are within normal limits. IMPRESSION: Normal CT appearance of the brain aside from a possible partially empty sella, which is often a normal anatomic variant but can be associated with idiopathic intracranial hypertension (pseudotumor cerebri) Electronically Signed   By: HGenevie AnnM.D.   On: 09/28/2017 10:45   Ct Entero Abd/pelvis W Contast  Result Date: 09/27/2017 CLINICAL DATA:  Upper abdominal pain, nausea, weight loss EXAM: CT ABDOMEN AND PELVIS WITH CONTRAST (ENTEROGRAPHY) TECHNIQUE: Multidetector CT of the abdomen and pelvis during bolus administration of intravenous contrast. Negative oral contrast was given. CONTRAST:  1032mOMNIPAQUE IOHEXOL 300 MG/ML  SOLN COMPARISON:  Multiple priors, most recently CT abdomen/pelvis dated 09/03/2017 FINDINGS: Lower chest:  Lung  bases are clear. Hepatobiliary: Liver is notable for a 1.8 cm hemangioma in segment 7 (series 3/image 15). Gallbladder is unremarkable. No intrahepatic or extrahepatic ductal dilatation. Pancreas: Within normal limits. Spleen: Within normal limits. Adrenals/Urinary Tract: Adrenal glands are within normal limits. Kidneys are within normal limits. Partially duplicated left renal collecting system (series 9/image 30). No hydronephrosis. Bladder is within normal limits. Stomach/Bowel: Stomach is within  normal limits. No evidence of bowel obstruction. Normal appendix (series 3/image 69). Vascular/Lymphatic: No evidence of abdominal aortic aneurysm. Circumaortic left renal vein. No suspicious abdominopelvic lymphadenopathy. Reproductive: Uterus is mildly heterogeneous, likely reflecting uterine fibroids, better evaluated ultrasound. Bilateral ovaries are within normal limits. Again noted is prior right ovarian dermoid resection. Other: No abdominopelvic ascites. Tiny fat containing periumbilical hernia. Musculoskeletal: Visualized osseous structures are within normal limits. IMPRESSION: No CT findings to account for the patient's abdominal pain. Stable ancillary findings as above. Electronically Signed   By: Julian Hy M.D.   On: 09/27/2017 13:28    Medications: I have reviewed the patient's current medications.  Assessment: 1.Nausea, vomiting, upper abdominal pain for about a month 2.Weight loss of 20 pounds due to decreased by mouth intake 3. Microcytic anemia, hemoglobin 9.8, MCV 72.3 HIDA scan showed gallbladder EF of 9% Unremarkable endoscopy on 8/19 Unremarkable CT enterography from 09/27/17 Unremarkable CT head from 09/28/88  4.U tox positive for THC and opioids.  Plan: Being evaluated for possible cholecystectomy by surgical team. Discussed about cyclical vomiting syndrome with use of marijuana and recommended to quit smoking marijuana. Currently on Phenergan and Reglan as needed along with  normal saline with 40 mEq of KCL at 100 mL an hour.  Ronnette Juniper 09/29/2017, 9:44 AM   Pager [phone] If no answer or after 5 PM call [phone]

## 2017-09-30 ENCOUNTER — Ambulatory Visit (HOSPITAL_COMMUNITY)
Admission: RE | Admit: 2017-09-30 | Discharge: 2017-09-30 | Disposition: A | Discharge status: Home / Self Care | Payer: Medicare Other | Source: Ambulatory Visit | Attending: Gastroenterology | Admitting: Gastroenterology

## 2017-09-30 ENCOUNTER — Encounter (HOSPITAL_COMMUNITY): Payer: Self-pay

## 2017-09-30 LAB — CBC
HEMATOCRIT: 35.1 % — AB (ref 36.0–46.0)
Hemoglobin: 10.6 g/dL — ABNORMAL LOW (ref 12.0–15.0)
MCH: 21.5 pg — ABNORMAL LOW (ref 26.0–34.0)
MCHC: 30.2 g/dL (ref 30.0–36.0)
MCV: 71.2 fL — ABNORMAL LOW (ref 78.0–100.0)
Platelets: 303 10*3/uL (ref 150–400)
RBC: 4.93 MIL/uL (ref 3.87–5.11)
RDW: 18.4 % — AB (ref 11.5–15.5)
WBC: 12.3 10*3/uL — ABNORMAL HIGH (ref 4.0–10.5)

## 2017-09-30 LAB — PROTIME-INR
INR: 1.13
PROTHROMBIN TIME: 14.4 s (ref 11.4–15.2)

## 2017-09-30 LAB — BASIC METABOLIC PANEL
Anion gap: 8 (ref 5–15)
BUN: 6 mg/dL (ref 6–20)
CALCIUM: 9 mg/dL (ref 8.9–10.3)
CO2: 25 mmol/L (ref 22–32)
Chloride: 107 mmol/L (ref 98–111)
Creatinine, Ser: 1.17 mg/dL — ABNORMAL HIGH (ref 0.44–1.00)
GFR calc Af Amer: >60 mL/min (ref >60)
GFR, EST NON AFRICAN AMERICAN: 57 mL/min — AB (ref >60)
GLUCOSE: 108 mg/dL — AB (ref 70–99)
Potassium: 4.2 mmol/L (ref 3.5–5.1)
Sodium: 140 mmol/L (ref 135–145)

## 2017-09-30 MED ORDER — MORPHINE SULFATE (PF) 2 MG/ML IV SOLN
2.0000 mg | INTRAVENOUS | Status: DC | PRN
  Administered 2017-09-30 – 2017-10-01 (×2): 2 mg via INTRAVENOUS
  Filled 2017-09-30 (×2): qty 1

## 2017-09-30 MED ORDER — CIPROFLOXACIN IN D5W 400 MG/200ML IV SOLN
400.0000 mg | INTRAVENOUS | Status: DC
  Filled 2017-09-30: qty 200

## 2017-09-30 MED ORDER — ENOXAPARIN SODIUM 40 MG/0.4ML ~~LOC~~ SOLN: 40.0000 mg | SUBCUTANEOUS | Status: DC

## 2017-09-30 NOTE — Progress Notes (Signed)
[facility]/Trauma Progress Note      Assessment/Plan Principal Problem:   Intractable nausea and vomiting Active Problems:   Hypokalemia   Elevated LFTs   Abnormal biliary HIDA scan   Marijuana abuse   Abdominal pain/nausea/vomiting - HIDA showed low gallbladder ejection fraction 9% - pt has not had pain since admission and on clears - she may benefit from a cholecystectomy although no guarentee it will resolve all of her symptoms  - Tbili and AST WNL, ALT slightly elevated at, alk phos WNL - WBC up slightly to 12.3  FEN: CLD VTE: SCD's, lovenox ID: none Foley: none Follow up: TBD  DISPO: pt failed PO trial. Will do lap chole this admission likely tomorrow pending any emergent surgeries.    LOS: 3 days    Subjective: CC: abdominal pain, nausea and vomiting  Pt was tolerating diet until early this am when she started having the same abdominal pain with nausea and vomiting.   Objective: Vital signs in last 24 hours: Temp:  [98.1 F (36.7 C)-98.7 F (37.1 C)] 98.3 F (36.8 C) (09/24 0547) Pulse Rate:  [93-99] 93 (09/24 0547) Resp:  [18-21] 21 (09/24 0547) BP: (96-117)/(64-72) 117/64 (09/24 0547) SpO2:  [96 %-100 %] 96 % (09/24 0547) Last BM Date: 09/27/17  Intake/Output from previous day: 09/23 0701 - 09/24 0700 In: 2565 [I.V.:2565] Out: -  Intake/Output this shift: No intake/output data recorded.  PE: Gen:  Alert, NAD, pleasant, cooperative Pulm:  Rate and  effort normal Abd: Soft, ND, obese, +BS, TTP of epigastric and RUQ with guarding, no peritonitis  Skin: no rashes noted, warm and dry  Anti-infectives: Anti-infectives (From admission, onward)   None      Lab Results:  Recent Labs    09/29/17 0544 09/30/17 0602  WBC 11.7* 12.3*  HGB 9.8* 10.6*  HCT 32.3* 35.1*  PLT 279 303   BMET Recent Labs    09/28/17 1413 09/30/17 0602  NA 141 140  K 4.2 4.2  CL 110 107  CO2 23 25  GLUCOSE 107* 108*  BUN <5* 6  CREATININE  1.02* 1.17*  CALCIUM 9.0 9.0   PT/INR No results for input(s): LABPROT, INR in the last 72 hours. CMP     Component Value Date/Time   NA 140 09/30/2017 0602   K 4.2 09/30/2017 0602   CL 107 09/30/2017 0602   CO2 25 09/30/2017 0602   GLUCOSE 108 (H) 09/30/2017 0602   BUN 6 09/30/2017 0602   CREATININE 1.17 (H) 09/30/2017 0602   CALCIUM 9.0 09/30/2017 0602   PROT 6.8 09/28/2017 1413   ALBUMIN 3.3 (L) 09/28/2017 1413   AST 24 09/28/2017 1413   ALT 54 (H) 09/28/2017 1413   ALKPHOS 93 09/28/2017 1413   BILITOT 0.4 09/28/2017 1413   GFRNONAA 57 (L) 09/30/2017 0602   GFRAA >60 09/30/2017 0602   Lipase     Component Value Date/Time   LIPASE 28 09/26/2017 1119    Studies/Results: Ct Head W & Wo Contrast  Result Date: 09/28/2017 CLINICAL DATA:  42 year old female with unexplained nausea vomiting. EXAM: CT HEAD WITHOUT AND WITH CONTRAST TECHNIQUE: Contiguous axial images were obtained from the base of the skull through the vertex without and with intravenous contrast CONTRAST:  1100m OMNIPAQUE IOHEXOL 300 MG/ML  SOLN COMPARISON:  Report of neck CT 03/18/2001 (no images available). FINDINGS: Brain: Normal cerebral volume. Possible partially empty sella. No midline shift, ventriculomegaly, mass effect, evidence of mass lesion, intracranial hemorrhage or evidence of cortically  based acute infarction. Gray-white matter differentiation is within normal limits throughout the brain. No abnormal enhancement identified. Vascular: The major intracranial vascular structures appear to be enhancing and patent. Skull: Negative. Sinuses/Orbits: Partially visible maxillary mucoperiosteal thickening. Other paranasal sinuses and mastoids are well pneumatized. Other: Visualized orbit soft tissues are within normal limits. Visualized scalp soft tissues are within normal limits. IMPRESSION: Normal CT appearance of the brain aside from a possible partially empty sella, which is often a normal anatomic variant but  can be associated with idiopathic intracranial hypertension (pseudotumor cerebri) Electronically Signed   By: Genevie Ann M.D.   On: 09/28/2017 10:45      Kalman Drape , [facility] 09/30/2017, 8:51 AM  Pager: [phone] Mon-Wed, Friday 7:00am-4:30pm Thurs 7am-11:30am  Consults: [phone]

## 2017-09-30 NOTE — Progress Notes (Signed)
Subjective: Patient seen at bedside. Did not want to be examined as she complains of severe nausea. She tried eating meat loaf yesterday and had 4-5 episodes of vomiting. She has been reevaluated for possible cholecystectomy to be done as an inpatient as per surgical evaluation.  Objective: Vital signs in last 24 hours: Temp:  [98.1 F (36.7 C)-98.7 F (37.1 C)] 98.3 F (36.8 C) (09/24 0547) Pulse Rate:  [93-99] 93 (09/24 0547) Resp:  [18-21] 21 (09/24 0547) BP: (96-117)/(64-72) 117/64 (09/24 0547) SpO2:  [96 %-100 %] 96 % (09/24 0547) Weight change:  Last BM Date: 09/27/17  PE:in mild distress from upper abdominal pain and ongoing nausea GENERAL:mild pallor, no icterus ABDOMEN:nondistended EXTREMITIES:no deformity  Lab Results: Results for orders placed or performed during the hospital encounter of 09/26/17 (from the past 48 hour(s))  Comprehensive metabolic panel     Status: Abnormal   Collection Time: 09/28/17  2:13 PM  Result Value Ref Range   Sodium 141 135 - 145 mmol/L   Potassium 4.2 3.5 - 5.1 mmol/L   Chloride 110 98 - 111 mmol/L   CO2 23 22 - 32 mmol/L   Glucose, Bld 107 (H) 70 - 99 mg/dL   BUN <5 (L) 6 - 20 mg/dL   Creatinine, Ser 1.02 (H) 0.44 - 1.00 mg/dL   Calcium 9.0 8.9 - 10.3 mg/dL   Total Protein 6.8 6.5 - 8.1 g/dL   Albumin 3.3 (L) 3.5 - 5.0 g/dL   AST 24 15 - 41 U/L   ALT 54 (H) 0 - 44 U/L   Alkaline Phosphatase 93 38 - 126 U/L   Total Bilirubin 0.4 0.3 - 1.2 mg/dL   GFR calc non Af Amer >60 >60 mL/min   GFR calc Af Amer >60 >60 mL/min    Comment: (NOTE) The eGFR has been calculated using the CKD EPI equation. This calculation has not been validated in all clinical situations. eGFR's persistently <60 mL/min signify possible Chronic Kidney Disease.    Anion gap 8 5 - 15    Comment: Performed at [facility], [place] [address] [place], [place] [address]  CBC     Status: Abnormal   Collection Time: 09/29/17  5:44 AM  Result Value  Ref Range   WBC 11.7 (H) 4.0 - 10.5 K/uL   RBC 4.47 3.87 - 5.11 MIL/uL   Hemoglobin 9.8 (L) 12.0 - 15.0 g/dL   HCT 32.3 (L) 36.0 - 46.0 %   MCV 72.3 (L) 78.0 - 100.0 fL   MCH 21.9 (L) 26.0 - 34.0 pg   MCHC 30.3 30.0 - 36.0 g/dL   RDW 18.5 (H) 11.5 - 15.5 %   Platelets 279 150 - 400 K/uL    Comment: Performed at [facility], [place] [address], [place], [place] [address]  Basic metabolic panel     Status: Abnormal   Collection Time: 09/30/17  6:02 AM  Result Value Ref Range   Sodium 140 135 - 145 mmol/L   Potassium 4.2 3.5 - 5.1 mmol/L   Chloride 107 98 - 111 mmol/L   CO2 25 22 - 32 mmol/L   Glucose, Bld 108 (H) 70 - 99 mg/dL   BUN 6 6 - 20 mg/dL   Creatinine, Ser 1.17 (H) 0.44 - 1.00 mg/dL   Calcium 9.0 8.9 - 10.3 mg/dL   GFR calc non Af Amer 57 (L) >60 mL/min   GFR calc Af Amer >60 >60 mL/min    Comment: (NOTE) The eGFR  has been calculated using the CKD EPI equation. This calculation has not been validated in all clinical situations. eGFR's persistently <60 mL/min signify possible Chronic Kidney Disease.    Anion gap 8 5 - 15    Comment: Performed at [facility], [place] [address], [place], [place] [address]  CBC     Status: Abnormal   Collection Time: 09/30/17  6:02 AM  Result Value Ref Range   WBC 12.3 (H) 4.0 - 10.5 K/uL   RBC 4.93 3.87 - 5.11 MIL/uL   Hemoglobin 10.6 (L) 12.0 - 15.0 g/dL   HCT 35.1 (L) 36.0 - 46.0 %   MCV 71.2 (L) 78.0 - 100.0 fL   MCH 21.5 (L) 26.0 - 34.0 pg   MCHC 30.2 30.0 - 36.0 g/dL   RDW 18.4 (H) 11.5 - 15.5 %   Platelets 303 150 - 400 K/uL    Comment: Performed at [facility], [place] [address], [place], [place] [address]    Studies/Results: No results found.  Medications: I have reviewed the patient's current medications.  Assessment: Ongoing nausea, frequent episodes of vomiting and upper abdominal pain in a patient with low ejection fraction noted on HIDA  scan  Plan: Cholecystectomy likely tomorrow as per surgical evaluation. Please reconsult GI if needed.   Ronnette Juniper 09/30/2017, 1:04 PM   Pager [phone] If no answer or after 5 PM call [phone]

## 2017-09-30 NOTE — Progress Notes (Signed)
PROGRESS NOTE    Haley Freeman   WCB:[id number]  DOB: May 22, 1975  DOA: 09/26/2017 PCP: [facility]   Brief Narrative:  Haley Freeman is a 42 y.o. female with medical history significant of prior history of alcohol abuse but nothing recently, marijuana use, anxiety, presents to the hospital with intractable nausea and vomiting for about 1 month now with a 20 lb weight loss. Recent EGD unrevealing.    Subjective: Tolerated clear liquids but after advancing to solids, did not tolerate dinner and began to have abdominal pain in upper abdomen once again. She states it is severe.    Assessment & Plan:   Principal Problem:   Intractable nausea and vomiting with leukocytosis   Elevated LFTs   Abnormal biliary HIDA scan - gen sugery consulted - further work up as above-  - Hepatitis panel negative - having work up by GI and Gen surgery - CT enterogram negative - head CT to r/o mass negative  - cont IVF - unable to tolerate solid food- gen surgery will proceed with cholecystectomy tomorrow - increase Morphine frequency to every 3 hr PRN  Active Problems:  Leukocytosis - ? Stress response - follow    Hypokalemia - replacing    Marijuana abuse  - per history and noted on UDS  AOCD - stable- following  DVT prophylaxis: Lovenox Code Status: Full code Family Communication:  Disposition Plan: home when stable Consultants:   GI  Gen sugery Procedures:  Antimicrobials:  Anti-infectives (From admission, onward)   None       Objective: Vitals:   09/29/17 0426 09/29/17 1550 09/29/17 2024 09/30/17 0547  BP: (!) 98/49 111/72 96/65 117/64  Pulse: 92 97 99 93  Resp: 18 18 18  (!) 21  Temp: 98.4 F (36.9 C) 98.1 F (36.7 C) 98.7 F (37.1 C) 98.3 F (36.8 C)  TempSrc: Oral  Oral Oral  SpO2: 98% 100% 98% 96%  Weight:      Height:        Intake/Output Summary (Last 24 hours) at 09/30/2017 1443 Last data filed at 09/30/2017 0546 Gross per  24 hour  Intake 2565.03 ml  Output -  Net 2565.03 ml   Filed Weights   09/26/17 1002  Weight: 133.8 kg    Examination:  General exam: Appears comfortable  HEENT: PERRLA, oral mucosa moist, no sclera icterus or thrush Respiratory system: Clear to auscultation. Respiratory effort normal. Cardiovascular system: S1 & S2 heard,  No murmurs  Gastrointestinal system: Abdomen soft, very tender in upper abdomen, nondistended. Normal bowel sound. No organomegaly Central nervous system: Alert and oriented. No focal neurological deficits. Extremities: No cyanosis, clubbing or edema Skin: No rashes or ulcers Psychiatry:  Mood & affect appropriate.     Data Reviewed: I have personally reviewed following labs and imaging studies  CBC: Recent Labs  Lab 09/25/17 0909 09/26/17 1119 09/27/17 0614 09/29/17 0544 09/30/17 0602  WBC 17.3* 16.2* 13.7* 11.7* 12.3*  NEUTROABS 11.9*  --   --   --   --   HGB 11.9* 10.7* 10.1* 9.8* 10.6*  HCT 38.8 35.0* 33.7* 32.3* 35.1*  MCV 70.5* 70.3* 72.8* 72.3* 71.2*  PLT 370 343 265 279 616   Basic Metabolic Panel: Recent Labs  Lab 09/26/17 1119 09/27/17 0614 09/28/17 0534 09/28/17 1413 09/30/17 0602  NA 139 140 139 141 140  K 3.4* 3.9 3.6 4.2 4.2  CL 102 106 106 110 107  CO2 26 28 25 23 25   GLUCOSE 113* 103*  123* 107* 108*  BUN 10 7 5* <5* 6  CREATININE 1.29* 1.12* 1.05* 1.02* 1.17*  CALCIUM 9.5 8.7* 8.7* 9.0 9.0   GFR: Estimated Creatinine Clearance: 90.9 mL/min (A) (by C-G formula based on SCr of 1.17 mg/dL (H)). Liver Function Tests: Recent Labs  Lab 09/26/17 1119 09/27/17 0614 09/28/17 1413  AST 90* 43* 24  ALT 122* 82* 54*  ALKPHOS 107 92 93  BILITOT 0.6 0.5 0.4  PROT 7.8 6.4* 6.8  ALBUMIN 4.0 3.1* 3.3*   Recent Labs  Lab 09/26/17 1119  LIPASE 28   No results for input(s): AMMONIA in the last 168 hours. Coagulation Profile: No results for input(s): INR, PROTIME in the last 168 hours. Cardiac Enzymes: No results for  input(s): CKTOTAL, CKMB, CKMBINDEX, TROPONINI in the last 168 hours. BNP (last 3 results) No results for input(s): PROBNP in the last 8760 hours. HbA1C: No results for input(s): HGBA1C in the last 72 hours. CBG: No results for input(s): GLUCAP in the last 168 hours. Lipid Profile: No results for input(s): CHOL, HDL, LDLCALC, TRIG, CHOLHDL, LDLDIRECT in the last 72 hours. Thyroid Function Tests: No results for input(s): TSH, T4TOTAL, FREET4, T3FREE, THYROIDAB in the last 72 hours. Anemia Panel: No results for input(s): VITAMINB12, FOLATE, FERRITIN, TIBC, IRON, RETICCTPCT in the last 72 hours. Urine analysis:    Component Value Date/Time   COLORURINE YELLOW 09/26/2017 1021   APPEARANCEUR CLOUDY (A) 09/26/2017 1021   LABSPEC 1.016 09/26/2017 1021   PHURINE 6.0 09/26/2017 1021   GLUCOSEU NEGATIVE 09/26/2017 1021   HGBUR SMALL (A) 09/26/2017 1021   BILIRUBINUR NEGATIVE 09/26/2017 1021   KETONESUR NEGATIVE 09/26/2017 1021   PROTEINUR 100 (A) 09/26/2017 1021   UROBILINOGEN 0.2 09/22/2014 0730   NITRITE NEGATIVE 09/26/2017 1021   LEUKOCYTESUR LARGE (A) 09/26/2017 1021   Sepsis Labs: @LABRCNTIP (procalcitonin:4,lacticidven:4) )No results found for this or any previous visit (from the past 240 hour(s)).       Radiology Studies: No results found.    Scheduled Meds: . barium  450 mL Oral Once  . enoxaparin (LOVENOX) injection  40 mg Subcutaneous Q24H  . metoCLOPramide (REGLAN) injection  5 mg Intravenous Q8H  . pantoprazole  40 mg Oral Daily  . traZODone  300 mg Oral QHS   Continuous Infusions: . sodium chloride 100 mL/hr at 09/30/17 0825     LOS: 3 days    Time spent in minutes: 35    Debbe Odea, MD [facility] Pager: [email or web address] Password [facility] 09/30/2017, 2:43 PM

## 2017-10-01 ENCOUNTER — Encounter (HOSPITAL_COMMUNITY)
Admission: EM | Disposition: A | Discharge status: Home / Self Care | Payer: Self-pay | Source: Home / Self Care | Attending: Internal Medicine

## 2017-10-01 ENCOUNTER — Inpatient Hospital Stay (HOSPITAL_COMMUNITY): Payer: Medicare Other | Admitting: Anesthesiology

## 2017-10-01 ENCOUNTER — Encounter: Payer: Medicare Other | Admitting: Obstetrics and Gynecology

## 2017-10-01 HISTORY — PX: CHOLECYSTECTOMY: SHX55

## 2017-10-01 LAB — SURGICAL PCR SCREEN
MRSA, PCR: NEGATIVE
STAPHYLOCOCCUS AUREUS: POSITIVE — AB

## 2017-10-01 SURGERY — LAPAROSCOPIC CHOLECYSTECTOMY WITH INTRAOPERATIVE CHOLANGIOGRAM
Anesthesia: General

## 2017-10-01 MED ORDER — LIDOCAINE HCL (PF) 1 % IJ SOLN
INTRAMUSCULAR | Status: AC
  Filled 2017-10-01: qty 30

## 2017-10-01 MED ORDER — SUGAMMADEX SODIUM 500 MG/5ML IV SOLN
INTRAVENOUS | Status: DC | PRN
  Administered 2017-10-01: 400 mg via INTRAVENOUS

## 2017-10-01 MED ORDER — DIPHENHYDRAMINE HCL 50 MG/ML IJ SOLN
INTRAMUSCULAR | Status: AC
  Filled 2017-10-01: qty 1

## 2017-10-01 MED ORDER — FENTANYL CITRATE (PF) 100 MCG/2ML IJ SOLN
INTRAMUSCULAR | Status: AC
  Filled 2017-10-01: qty 2

## 2017-10-01 MED ORDER — PROPOFOL 10 MG/ML IV BOLUS
INTRAVENOUS | Status: DC | PRN
  Administered 2017-10-01: 200 mg via INTRAVENOUS

## 2017-10-01 MED ORDER — TRAMADOL HCL 50 MG PO TABS
50.0000 mg | ORAL_TABLET | Freq: Four times a day (QID) | ORAL | Status: DC | PRN
  Administered 2017-10-01: 50 mg via ORAL
  Filled 2017-10-01: qty 1

## 2017-10-01 MED ORDER — LIDOCAINE 2% (20 MG/ML) 5 ML SYRINGE
INTRAMUSCULAR | Status: DC | PRN
  Administered 2017-10-01: 80 mg via INTRAVENOUS

## 2017-10-01 MED ORDER — CELECOXIB 200 MG PO CAPS
200.0000 mg | ORAL_CAPSULE | Freq: Once | ORAL | Status: DC
  Filled 2017-10-01: qty 1

## 2017-10-01 MED ORDER — IOPAMIDOL (ISOVUE-300) INJECTION 61%
INTRAVENOUS | Status: AC
  Filled 2017-10-01: qty 50

## 2017-10-01 MED ORDER — LACTATED RINGERS IR SOLN
Status: DC | PRN
  Administered 2017-10-01: 1000 mL

## 2017-10-01 MED ORDER — PROMETHAZINE HCL 25 MG/ML IJ SOLN: 6.2500 mg | INTRAMUSCULAR | Status: DC | PRN

## 2017-10-01 MED ORDER — ACETAMINOPHEN 500 MG PO TABS
1000.0000 mg | ORAL_TABLET | Freq: Three times a day (TID) | ORAL | Status: DC
  Administered 2017-10-01 (×2): 1000 mg via ORAL
  Filled 2017-10-01 (×3): qty 2

## 2017-10-01 MED ORDER — FENTANYL CITRATE (PF) 100 MCG/2ML IJ SOLN
25.0000 ug | INTRAMUSCULAR | Status: DC | PRN
  Administered 2017-10-01 (×2): 50 ug via INTRAVENOUS

## 2017-10-01 MED ORDER — FENTANYL CITRATE (PF) 100 MCG/2ML IJ SOLN
INTRAMUSCULAR | Status: DC | PRN
  Administered 2017-10-01 (×2): 50 ug via INTRAVENOUS
  Administered 2017-10-01: 100 ug via INTRAVENOUS

## 2017-10-01 MED ORDER — SUGAMMADEX SODIUM 500 MG/5ML IV SOLN
INTRAVENOUS | Status: AC
  Filled 2017-10-01: qty 5

## 2017-10-01 MED ORDER — LACTATED RINGERS IV SOLN
INTRAVENOUS | Status: DC | PRN
  Administered 2017-10-01: 10:00:00 via INTRAVENOUS

## 2017-10-01 MED ORDER — ENOXAPARIN SODIUM 40 MG/0.4ML ~~LOC~~ SOLN: 40.0000 mg | SUBCUTANEOUS | Status: DC

## 2017-10-01 MED ORDER — SCOPOLAMINE 1 MG/3DAYS TD PT72: 1.0000 | MEDICATED_PATCH | Freq: Once | TRANSDERMAL | Status: DC

## 2017-10-01 MED ORDER — HYDROMORPHONE HCL 1 MG/ML IJ SOLN
INTRAMUSCULAR | Status: AC
  Filled 2017-10-01: qty 1

## 2017-10-01 MED ORDER — MIDAZOLAM HCL 5 MG/5ML IJ SOLN
INTRAMUSCULAR | Status: DC | PRN
  Administered 2017-10-01: 2 mg via INTRAVENOUS

## 2017-10-01 MED ORDER — ONDANSETRON HCL 4 MG/2ML IJ SOLN
INTRAMUSCULAR | Status: DC | PRN
  Administered 2017-10-01: 4 mg via INTRAVENOUS

## 2017-10-01 MED ORDER — SCOPOLAMINE 1 MG/3DAYS TD PT72
MEDICATED_PATCH | TRANSDERMAL | Status: AC
  Filled 2017-10-01: qty 1

## 2017-10-01 MED ORDER — PHENYLEPHRINE 40 MCG/ML (10ML) SYRINGE FOR IV PUSH (FOR BLOOD PRESSURE SUPPORT)
PREFILLED_SYRINGE | INTRAVENOUS | Status: AC
  Filled 2017-10-01: qty 20

## 2017-10-01 MED ORDER — PHENYLEPHRINE 40 MCG/ML (10ML) SYRINGE FOR IV PUSH (FOR BLOOD PRESSURE SUPPORT)
PREFILLED_SYRINGE | INTRAVENOUS | Status: DC | PRN
  Administered 2017-10-01 (×7): 80 ug via INTRAVENOUS

## 2017-10-01 MED ORDER — MEPERIDINE HCL 50 MG/ML IJ SOLN: 6.2500 mg | INTRAMUSCULAR | Status: DC | PRN

## 2017-10-01 MED ORDER — PROMETHAZINE HCL 25 MG/ML IJ SOLN
INTRAMUSCULAR | Status: AC
  Filled 2017-10-01: qty 1

## 2017-10-01 MED ORDER — DEXAMETHASONE SODIUM PHOSPHATE 10 MG/ML IJ SOLN
INTRAMUSCULAR | Status: DC | PRN
  Administered 2017-10-01: 10 mg via INTRAVENOUS

## 2017-10-01 MED ORDER — LIDOCAINE HCL (PF) 1 % IJ SOLN
INTRAMUSCULAR | Status: DC | PRN
  Administered 2017-10-01: 30 mL

## 2017-10-01 MED ORDER — MORPHINE SULFATE (PF) 2 MG/ML IV SOLN
2.0000 mg | INTRAVENOUS | Status: DC | PRN
  Administered 2017-10-02: 2 mg via INTRAVENOUS
  Filled 2017-10-01: qty 1

## 2017-10-01 MED ORDER — DIPHENHYDRAMINE HCL 50 MG/ML IJ SOLN
INTRAMUSCULAR | Status: DC | PRN
  Administered 2017-10-01: 12.5 mg via INTRAVENOUS

## 2017-10-01 MED ORDER — BUPIVACAINE-EPINEPHRINE (PF) 0.25% -1:200000 IJ SOLN
INTRAMUSCULAR | Status: DC | PRN
  Administered 2017-10-01: 30 mL via PERINEURAL

## 2017-10-01 MED ORDER — ROCURONIUM BROMIDE 10 MG/ML (PF) SYRINGE
PREFILLED_SYRINGE | INTRAVENOUS | Status: DC | PRN
  Administered 2017-10-01: 20 mg via INTRAVENOUS
  Administered 2017-10-01: 50 mg via INTRAVENOUS

## 2017-10-01 MED ORDER — SUCCINYLCHOLINE CHLORIDE 200 MG/10ML IV SOSY
PREFILLED_SYRINGE | INTRAVENOUS | Status: DC | PRN
  Administered 2017-10-01: 140 mg via INTRAVENOUS

## 2017-10-01 MED ORDER — BUPIVACAINE-EPINEPHRINE (PF) 0.25% -1:200000 IJ SOLN
INTRAMUSCULAR | Status: AC
  Filled 2017-10-01: qty 30

## 2017-10-01 MED ORDER — MIDAZOLAM HCL 2 MG/2ML IJ SOLN
INTRAMUSCULAR | Status: AC
  Filled 2017-10-01: qty 2

## 2017-10-01 MED ORDER — GABAPENTIN 300 MG PO CAPS
300.0000 mg | ORAL_CAPSULE | Freq: Once | ORAL | Status: DC
  Filled 2017-10-01: qty 1

## 2017-10-01 MED ORDER — MIDAZOLAM HCL 2 MG/2ML IJ SOLN: 0.5000 mg | Freq: Once | INTRAMUSCULAR | Status: DC | PRN

## 2017-10-01 MED ORDER — HYDROMORPHONE HCL 1 MG/ML IJ SOLN
0.2500 mg | INTRAMUSCULAR | Status: DC | PRN
  Administered 2017-10-01 (×4): 0.5 mg via INTRAVENOUS

## 2017-10-01 MED ORDER — OXYCODONE HCL 5 MG PO TABS
5.0000 mg | ORAL_TABLET | ORAL | Status: DC | PRN
  Administered 2017-10-01: 5 mg via ORAL
  Administered 2017-10-02: 10 mg via ORAL
  Filled 2017-10-01: qty 1
  Filled 2017-10-01 (×2): qty 2

## 2017-10-01 MED ORDER — ACETAMINOPHEN 500 MG PO TABS
1000.0000 mg | ORAL_TABLET | Freq: Once | ORAL | Status: DC
  Filled 2017-10-01: qty 2

## 2017-10-01 MED ORDER — SCOPOLAMINE 1 MG/3DAYS TD PT72
1.0000 | MEDICATED_PATCH | Freq: Once | TRANSDERMAL | Status: AC
  Administered 2017-10-01: 1 via TRANSDERMAL
  Filled 2017-10-01: qty 1

## 2017-10-01 SURGICAL SUPPLY — 38 items
APPLIER CLIP ROT 10 11.4 M/L (STAPLE) ×3
CABLE HIGH FREQUENCY MONO STRZ (ELECTRODE) ×3 IMPLANT
CHLORAPREP W/TINT 26ML (MISCELLANEOUS) ×3 IMPLANT
CLIP APPLIE ROT 10 11.4 M/L (STAPLE) ×1 IMPLANT
CLIP VESOLOCK MED LG 6/CT (CLIP) IMPLANT
COVER MAYO STAND STRL (DRAPES) IMPLANT
COVER SURGICAL LIGHT HANDLE (MISCELLANEOUS) ×3 IMPLANT
DECANTER SPIKE VIAL GLASS SM (MISCELLANEOUS) ×3 IMPLANT
DERMABOND ADVANCED (GAUZE/BANDAGES/DRESSINGS) ×2
DERMABOND ADVANCED .7 DNX12 (GAUZE/BANDAGES/DRESSINGS) ×1 IMPLANT
DRAPE C-ARM 42X120 X-RAY (DRAPES) IMPLANT
ELECT L-HOOK LAP 45CM DISP (ELECTROSURGICAL)
ELECT PENCIL ROCKER SW 15FT (MISCELLANEOUS) ×3 IMPLANT
ELECT REM PT RETURN 15FT ADLT (MISCELLANEOUS) ×3 IMPLANT
ELECTRODE L-HOOK LAP 45CM DISP (ELECTROSURGICAL) IMPLANT
GLOVE BIO SURGEON STRL SZ 6 (GLOVE) ×3 IMPLANT
GLOVE INDICATOR 6.5 STRL GRN (GLOVE) ×3 IMPLANT
GOWN STRL REUS W/TWL 2XL LVL3 (GOWN DISPOSABLE) ×3 IMPLANT
GOWN STRL REUS W/TWL XL LVL3 (GOWN DISPOSABLE) ×6 IMPLANT
HEMOSTAT SNOW SURGICEL 2X4 (HEMOSTASIS) IMPLANT
KIT BASIN OR (CUSTOM PROCEDURE TRAY) ×3 IMPLANT
L-HOOK LAP DISP 36CM (ELECTROSURGICAL) ×3
LHOOK LAP DISP 36CM (ELECTROSURGICAL) ×1 IMPLANT
POSITIONER SURGICAL ARM (MISCELLANEOUS) IMPLANT
POUCH SPECIMEN RETRIEVAL 10MM (ENDOMECHANICALS) ×3 IMPLANT
SCISSORS LAP 5X35 DISP (ENDOMECHANICALS) ×3 IMPLANT
SET CHOLANGIOGRAPH MIX (MISCELLANEOUS) IMPLANT
SET IRRIG TUBING LAPAROSCOPIC (IRRIGATION / IRRIGATOR) ×3 IMPLANT
SLEEVE XCEL OPT CAN 5 100 (ENDOMECHANICALS) ×3 IMPLANT
SUT MNCRL AB 4-0 PS2 18 (SUTURE) ×3 IMPLANT
TAPE CLOTH 4X10 WHT NS (GAUZE/BANDAGES/DRESSINGS) IMPLANT
TOWEL OR 17X26 10 PK STRL BLUE (TOWEL DISPOSABLE) ×3 IMPLANT
TOWEL OR NON WOVEN STRL DISP B (DISPOSABLE) ×3 IMPLANT
TRAY LAPAROSCOPIC (CUSTOM PROCEDURE TRAY) ×3 IMPLANT
TROCAR BLADELESS OPT 5 100 (ENDOMECHANICALS) ×3 IMPLANT
TROCAR XCEL BLUNT TIP 100MML (ENDOMECHANICALS) ×3 IMPLANT
TROCAR XCEL NON-BLD 11X100MML (ENDOMECHANICALS) ×3 IMPLANT
TUBING INSUF HEATED (TUBING) ×3 IMPLANT

## 2017-10-01 NOTE — Transfer of Care (Signed)
Immediate Anesthesia Transfer of Care Note  Patient: Haley Freeman  Procedure(s) Performed: Procedure(s): LAPAROSCOPIC CHOLECYSTECTOMY WITH POSSIBLE  INTRAOPERATIVE CHOLANGIOGRAM (N/A)  Patient Location: PACU  Anesthesia Type:General  Level of Consciousness:  sedated, patient cooperative and responds to stimulation  Airway & Oxygen Therapy:Patient Spontanous Breathing and Patient connected to face mask oxgen  Post-op Assessment:  Report given to PACU RN and Post -op Vital signs reviewed and stable  Post vital signs:  Reviewed and stable  Last Vitals:  Vitals:   10/01/17 0526 10/01/17 0636  BP: (!) 148/118 135/76  Pulse: 89 82  Resp: 18   Temp: 37.3 C   SpO2: 471%     Complications: No apparent anesthesia complications

## 2017-10-01 NOTE — Anesthesia Preprocedure Evaluation (Addendum)
Anesthesia Evaluation  Patient identified by MRN, date of birth, ID band Patient awake    Reviewed: Allergy & Precautions, NPO status , Patient's Chart, lab work & pertinent test results  Airway Mallampati: II  TM Distance: >3 FB Neck ROM: Full    Dental  (+) Teeth Intact, Dental Advisory Given, Chipped,    Pulmonary former smoker,    Pulmonary exam normal breath sounds clear to auscultation       Cardiovascular Exercise Tolerance: Good negative cardio ROS Normal cardiovascular exam Rhythm:Regular Rate:Normal     Neuro/Psych  Headaches, PSYCHIATRIC DISORDERS Anxiety Depression    GI/Hepatic GERD  Medicated,(+)     substance abuse  alcohol use and marijuana use, Biliary cholic    Endo/Other  Morbid obesity  Renal/GU negative Renal ROS     Musculoskeletal negative musculoskeletal ROS (+)   Abdominal   Peds  Hematology  (+) Blood dyscrasia, anemia ,   Anesthesia Other Findings Day of surgery medications reviewed with the patient.  Reproductive/Obstetrics                            Anesthesia Physical Anesthesia Plan  ASA: III  Anesthesia Plan: General   Post-op Pain Management:    Induction: Intravenous and Rapid sequence  PONV Risk Score and Plan: 4 or greater and Scopolamine patch - Pre-op, Midazolam, Dexamethasone, Ondansetron and Diphenhydramine  Airway Management Planned: Oral ETT  Additional Equipment:   Intra-op Plan:   Post-operative Plan: Extubation in OR  Informed Consent: I have reviewed the patients History and Physical, chart, labs and discussed the procedure including the risks, benefits and alternatives for the proposed anesthesia with the patient or authorized representative who has indicated his/her understanding and acceptance.   Dental advisory given  Plan Discussed with: CRNA  Anesthesia Plan Comments:         Anesthesia Quick Evaluation

## 2017-10-01 NOTE — Progress Notes (Signed)
[facility]/Trauma Progress Note  Day of Surgery   Assessment/Plan Principal Problem: Intractable nausea and vomiting Active Problems: Hypokalemia Elevated LFTs Abnormal biliary HIDA scan Marijuana abuse   Abdominal pain/nausea/vomiting - HIDA showed low gallbladder ejection fraction 9% - pt has not had pain since admission and on clears - she may benefit from a cholecystectomy although no guarentee it will resolve all of her symptoms  - Tbili and AST WNL, ALT slightly elevated at, alk phos WNL - WBC up slightly to 12.3 09/24  FEN:NPO VTE: SCD's, lovenox TM:HDQQ Foley:none Follow up:TBD  DISPO:OR today for lap chole    LOS: 4 days    Subjective: CC: nausea and vomiting  Abdominal pain has resolved but pt had nausea and vomiting this am. No other issues overnight. No fever or chills.   Objective: Vital signs in last 24 hours: Temp:  [99.1 F (37.3 C)-99.9 F (37.7 C)] 99.1 F (37.3 C) (09/25 0526) Pulse Rate:  [82-100] 82 (09/25 0636) Resp:  [18-20] 18 (09/25 0526) BP: (135-151)/(76-118) 135/76 (09/25 0636) SpO2:  [100 %] 100 % (09/25 0526) Last BM Date: 09/27/17  Intake/Output from previous day: 09/24 0701 - 09/25 0700 In: 658.3 [I.V.:658.3] Out: -  Intake/Output this shift: No intake/output data recorded.  PE: Gen: Alert, NAD, pleasant, cooperative Pulm:Rate andeffort normal Abd: Soft,ND, obese,+BS, no TTP, no peritonitis Skin: no rashes noted, warm and dry   Anti-infectives: Anti-infectives (From admission, onward)   Start     Dose/Rate Route Frequency Ordered Stop   10/01/17 0600  [MAR Hold]  ciprofloxacin (CIPRO) IVPB 400 mg     (MAR Hold since Wed 10/01/2017 at 0853. Reason: Transfer to a Procedural area.)   400 mg 200 mL/hr over 60 Minutes Intravenous On call to O.R. 09/30/17 1553 10/02/17 0559      Lab Results:  Recent Labs    09/29/17 0544 09/30/17 0602  WBC 11.7* 12.3*  HGB 9.8* 10.6*  HCT  32.3* 35.1*  PLT 279 303   BMET Recent Labs    09/28/17 1413 09/30/17 0602  NA 141 140  K 4.2 4.2  CL 110 107  CO2 23 25  GLUCOSE 107* 108*  BUN <5* 6  CREATININE 1.02* 1.17*  CALCIUM 9.0 9.0   PT/INR Recent Labs    09/30/17 1615  LABPROT 14.4  INR 1.13   CMP     Component Value Date/Time   NA 140 09/30/2017 0602   K 4.2 09/30/2017 0602   CL 107 09/30/2017 0602   CO2 25 09/30/2017 0602   GLUCOSE 108 (H) 09/30/2017 0602   BUN 6 09/30/2017 0602   CREATININE 1.17 (H) 09/30/2017 0602   CALCIUM 9.0 09/30/2017 0602   PROT 6.8 09/28/2017 1413   ALBUMIN 3.3 (L) 09/28/2017 1413   AST 24 09/28/2017 1413   ALT 54 (H) 09/28/2017 1413   ALKPHOS 93 09/28/2017 1413   BILITOT 0.4 09/28/2017 1413   GFRNONAA 57 (L) 09/30/2017 0602   GFRAA >60 09/30/2017 0602   Lipase     Component Value Date/Time   LIPASE 28 09/26/2017 1119    Studies/Results: No results found.    Kalman Drape , [facility] Surgery 10/01/2017, 8:59 AM  Pager: [phone] Mon-Wed, Friday 7:00am-4:30pm Thurs 7am-11:30am  Consults: [phone]

## 2017-10-01 NOTE — Op Note (Signed)
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    Pre-operative Diagnosis: chronic cholecystitis  Post-operative Diagnosis: minimal inflammation  Surgeon: RSWNIO,EVOJJ   Assistants: Will Creig Hines, PA-C  Anesthesia: General endotracheal anesthesia and local  ASA Class: 3  Procedure Details  The patient was seen again in the Holding Room. The risks, benefits, complications, treatment options, and expected outcomes were discussed with the patient. The possibilities of  bleeding, recurrent infection, damage to nearby structures, the need for additional procedures, failure to diagnose a condition, the possible need to convert to an open procedure, and creating a complication requiring transfusion or operation were discussed with the patient. The likelihood of improving the patient's symptoms with return to their baseline status is good.    The patient and/or family concurred with the proposed plan, giving informed consent. The site of surgery properly noted. The patient was taken to Operating Room, and the procedure verified as Laparoscopic Cholecystectomy. A Time Out was held and the above information confirmed.  Prior to the induction of general anesthesia, antibiotic prophylaxis was administered. General endotracheal anesthesia was then administered and tolerated well. After the induction, the abdomen was prepped with Chloraprep and draped in the sterile fashion. The patient was positioned in the supine position.  Local anesthetic agent was injected into the skin near the umbilicus and an incision made. We dissected down to the abdominal fascia with blunt dissection.  The fascia was incised vertically and we entered the peritoneal cavity bluntly.  A pursestring suture of 0-Vicryl was placed around the fascial opening.  The Hasson cannula was inserted and secured with the  stay suture.  Pneumoperitoneum was then created with CO2 and tolerated well without any adverse changes in the patient's vital signs. An 11-mm port was placed in the subxiphoid position.  Two 5-mm ports were placed in the right upper quadrant. All skin incisions were infiltrated with a local anesthetic agent before making the incision and placing the trocars.   We positioned the patient in reverse Trendelenburg, tilted slightly to the patient's left.  The gallbladder was identified, the fundus grasped and retracted cephalad. Adhesions were lysed bluntly and with the electrocautery where indicated, taking care not to injure any adjacent organs or viscus. The infundibulum was grasped and retracted laterally, exposing the peritoneum overlying the triangle of Calot. This was then divided and exposed in a blunt fashion. A critical view of the cystic duct and cystic artery was obtained.  The cystic artery was tiny and was up on top of the cystic duct.  These were clipped together.  Three clips were placed proximally and one distally.   The gallbladder was dissected from the liver bed in retrograde fashion with the electrocautery. The gallbladder was removed and placed in an Endocatch bag.  The gallbladder and Endocatch bag were then removed through the umbilical port site.  The liver bed was irrigated and inspected. Hemostasis was achieved with the electrocautery. Copious irrigation was utilized and was repeatedly aspirated until clear.    We again inspected the right upper quadrant for hemostasis.  Pneumoperitoneum was released as we removed the trocars.   The pursestring suture was used to close the umbilical fascia.  4-0 Monocryl was used to close the skin.   The skin was cleaned and dry, and Dermabond was applied. The patient was then extubated and brought to the recovery room in stable condition. Instrument, sponge, and needle counts were correct at closure and  at the conclusion of the case.   Findings: Very  mild inflammation.    Estimated Blood Loss: min         Drains: none          Specimens: Gallbladder to pathology       Complications: None; patient tolerated the procedure well.         Disposition: PACU - hemodynamically stable.         Condition: stable

## 2017-10-01 NOTE — Anesthesia Procedure Notes (Signed)
Procedure Name: Intubation Date/Time: 10/01/2017 10:12 AM Performed by: Lavina Hamman, CRNA Pre-anesthesia Checklist: Patient identified, Emergency Drugs available, Suction available, Patient being monitored and Timeout performed Patient Re-evaluated:Patient Re-evaluated prior to induction Oxygen Delivery Method: Circle system utilized Preoxygenation: Pre-oxygenation with 100% oxygen Induction Type: IV induction Ventilation: Mask ventilation without difficulty Laryngoscope Size: Mac and 4 Grade View: Grade II Tube type: Oral Tube size: 7.5 mm Number of attempts: 1 Airway Equipment and Method: Stylet Placement Confirmation: ETT inserted through vocal cords under direct vision,  positive ETCO2,  CO2 detector and breath sounds checked- equal and bilateral Secured at: 22 cm Tube secured with: Tape Dental Injury: Teeth and Oropharynx as per pre-operative assessment

## 2017-10-01 NOTE — Progress Notes (Signed)
Patient ID: Haley Freeman, female   DOB: [birth year], 42 y.o.   MRN: [id number]  PROGRESS NOTE    JALEENA VIVIANI  BOF:[id number] DOB: 1975-07-23 DOA: 09/26/2017 PCP: [facility]   Brief Narrative:  42 year old female with history of prior alcohol abuse, marijuana use, anxiety presented on 09/26/2017 with intractable nausea and vomiting for about a month now with 20 pound weight loss.  Recent EGD was unrevealing.  GI was consulted.  HIDA scan was abnormal.  General surgery was consulted  Assessment & Plan:   Principal Problem:   Intractable nausea and vomiting Active Problems:   Hypokalemia   Elevated LFTs   Abnormal biliary HIDA scan   Marijuana abuse   Intractable nausea and vomiting with leukocytosis/elevated LFTs/abnormal biliary HIDA scan -Hepatitis panel negative.  CT angiogram negative.  Head CT was negative for mass lesion.  GI has signed off -Status post cholecystectomy by general surgery today. -Continue antiemetics and pain management  Leukocytosis -Probably reactive.  Monitor  Marijuana abuse -Noted on UDS  Anemia of chronic disease -Monitor.  Hemoglobin stable  DVT prophylaxis: Lovenox Code Status: Full Family Communication: None at bedside Disposition Plan: Home once cleared by general surgery  Consultants: GI/general surgery  Procedures: Cholecystectomy on 10/01/2017  Antimicrobials: None   Subjective: Patient seen and examined at bedside in PACU.  Still a little drowsy.  Complains of some abdominal pain.  Objective: Vitals:   10/01/17 1200 10/01/17 1215 10/01/17 1230 10/01/17 1259  BP: (!) 152/99 (!) 146/94 (!) 146/94 103/62  Pulse: 90 88 92 90  Resp: 19 16 19 20   Temp: 98 F (36.7 C)  98 F (36.7 C) 98.9 F (37.2 C)  TempSrc:    Oral  SpO2: 100% 100% 100% 98%  Weight:      Height:        Intake/Output Summary (Last 24 hours) at 10/01/2017 1432 Last data filed at 10/01/2017 1329 Gross per 24 hour  Intake 1458.33 ml   Output 325 ml  Net 1133.33 ml   Filed Weights   09/26/17 1002  Weight: 133.8 kg    Examination:  General exam: Appears slightly drowsy but answers some questions. Respiratory system: Bilateral decreased breath sounds at bases Cardiovascular system: S1 & S2 heard, Rate controlled Gastrointestinal system: Abdomen is nondistended, soft and mildly tender diffusely. Normal bowel sounds heard. Extremities: No cyanosis, clubbing, edema   Data Reviewed: I have personally reviewed following labs and imaging studies  CBC: Recent Labs  Lab 09/25/17 0909 09/26/17 1119 09/27/17 0614 09/29/17 0544 09/30/17 0602  WBC 17.3* 16.2* 13.7* 11.7* 12.3*  NEUTROABS 11.9*  --   --   --   --   HGB 11.9* 10.7* 10.1* 9.8* 10.6*  HCT 38.8 35.0* 33.7* 32.3* 35.1*  MCV 70.5* 70.3* 72.8* 72.3* 71.2*  PLT 370 343 265 279 778   Basic Metabolic Panel: Recent Labs  Lab 09/26/17 1119 09/27/17 0614 09/28/17 0534 09/28/17 1413 09/30/17 0602  NA 139 140 139 141 140  K 3.4* 3.9 3.6 4.2 4.2  CL 102 106 106 110 107  CO2 26 28 25 23 25   GLUCOSE 113* 103* 123* 107* 108*  BUN 10 7 5* <5* 6  CREATININE 1.29* 1.12* 1.05* 1.02* 1.17*  CALCIUM 9.5 8.7* 8.7* 9.0 9.0   GFR: Estimated Creatinine Clearance: 90.9 mL/min (A) (by C-G formula based on SCr of 1.17 mg/dL (H)). Liver Function Tests: Recent Labs  Lab 09/26/17 1119 09/27/17 0614 09/28/17 1413  AST 90* 43* 24  ALT 122* 82* 54*  ALKPHOS 107 92 93  BILITOT 0.6 0.5 0.4  PROT 7.8 6.4* 6.8  ALBUMIN 4.0 3.1* 3.3*   Recent Labs  Lab 09/26/17 1119  LIPASE 28   No results for input(s): AMMONIA in the last 168 hours. Coagulation Profile: Recent Labs  Lab 09/30/17 1615  INR 1.13   Cardiac Enzymes: No results for input(s): CKTOTAL, CKMB, CKMBINDEX, TROPONINI in the last 168 hours. BNP (last 3 results) No results for input(s): PROBNP in the last 8760 hours. HbA1C: No results for input(s): HGBA1C in the last 72 hours. CBG: No results for  input(s): GLUCAP in the last 168 hours. Lipid Profile: No results for input(s): CHOL, HDL, LDLCALC, TRIG, CHOLHDL, LDLDIRECT in the last 72 hours. Thyroid Function Tests: No results for input(s): TSH, T4TOTAL, FREET4, T3FREE, THYROIDAB in the last 72 hours. Anemia Panel: No results for input(s): VITAMINB12, FOLATE, FERRITIN, TIBC, IRON, RETICCTPCT in the last 72 hours. Sepsis Labs: No results for input(s): PROCALCITON, LATICACIDVEN in the last 168 hours.  Recent Results (from the past 240 hour(s))  Surgical pcr screen     Status: Abnormal   Collection Time: 09/30/17 10:30 PM  Result Value Ref Range Status   MRSA, PCR NEGATIVE NEGATIVE Final   Staphylococcus aureus POSITIVE (A) NEGATIVE Final    Comment: (NOTE) The Xpert SA Assay (FDA approved for NASAL specimens in patients 12 years of age and older), is one component of a comprehensive surveillance program. It is not intended to diagnose infection nor to guide or monitor treatment. Performed at [facility], [place] [address], [place], [place] [address]          Radiology Studies: No results found.      Scheduled Meds: . acetaminophen  1,000 mg Oral Q8H  . [START ON 10/02/2017] enoxaparin (LOVENOX) injection  40 mg Subcutaneous Q24H  . fentaNYL      . HYDROmorphone      . HYDROmorphone      . metoCLOPramide (REGLAN) injection  5 mg Intravenous Q8H  . pantoprazole  40 mg Oral Daily  . promethazine      . traZODone  300 mg Oral QHS   Continuous Infusions: . sodium chloride 100 mL/hr at 10/01/17 1300     LOS: 4 days        Aline August, MD [facility] Pager [phone]  If 7PM-7AM, please contact night-coverage [email or web address] Password [facility] 10/01/2017, 2:32 PM

## 2017-10-02 ENCOUNTER — Encounter (HOSPITAL_COMMUNITY): Payer: Self-pay | Admitting: General Surgery

## 2017-10-02 LAB — COMPREHENSIVE METABOLIC PANEL
ALBUMIN: 3.3 g/dL — AB (ref 3.5–5.0)
ALT: 34 U/L (ref 0–44)
AST: 33 U/L (ref 15–41)
Alkaline Phosphatase: 87 U/L (ref 38–126)
Anion gap: 5 (ref 5–15)
BUN: 9 mg/dL (ref 6–20)
CALCIUM: 9.3 mg/dL (ref 8.9–10.3)
CHLORIDE: 107 mmol/L (ref 98–111)
CO2: 27 mmol/L (ref 22–32)
CREATININE: 1 mg/dL (ref 0.44–1.00)
GFR calc non Af Amer: >60 mL/min (ref >60)
Glucose, Bld: 120 mg/dL — ABNORMAL HIGH (ref 70–99)
Potassium: 3.9 mmol/L (ref 3.5–5.1)
Sodium: 139 mmol/L (ref 135–145)
Total Bilirubin: 0.2 mg/dL — ABNORMAL LOW (ref 0.3–1.2)
Total Protein: 7.1 g/dL (ref 6.5–8.1)

## 2017-10-02 LAB — CBC WITH DIFFERENTIAL/PLATELET
BASOS ABS: 0 10*3/uL (ref 0.0–0.1)
Basophils Relative: 0 %
EOS ABS: 0 10*3/uL (ref 0.0–0.7)
Eosinophils Relative: 0 %
HEMATOCRIT: 32.7 % — AB (ref 36.0–46.0)
Hemoglobin: 9.9 g/dL — ABNORMAL LOW (ref 12.0–15.0)
LYMPHS PCT: 19 %
Lymphs Abs: 3.4 10*3/uL (ref 0.7–4.0)
MCH: 21.8 pg — ABNORMAL LOW (ref 26.0–34.0)
MCHC: 30.3 g/dL (ref 30.0–36.0)
MCV: 72 fL — ABNORMAL LOW (ref 78.0–100.0)
MONO ABS: 0.8 10*3/uL (ref 0.1–1.0)
Monocytes Relative: 5 %
Neutro Abs: 13.5 10*3/uL — ABNORMAL HIGH (ref 1.7–7.7)
Neutrophils Relative %: 76 %
PLATELETS: 320 10*3/uL (ref 150–400)
RBC: 4.54 MIL/uL (ref 3.87–5.11)
RDW: 18.5 % — AB (ref 11.5–15.5)
WBC: 17.8 10*3/uL — ABNORMAL HIGH (ref 4.0–10.5)

## 2017-10-02 LAB — MAGNESIUM: Magnesium: 2.2 mg/dL (ref 1.7–2.4)

## 2017-10-02 MED ORDER — ALUM & MAG HYDROXIDE-SIMETH 200-200-20 MG/5ML PO SUSP: 15.0000 mL | ORAL | 0 refills | Status: DC | PRN

## 2017-10-02 MED ORDER — ALUM & MAG HYDROXIDE-SIMETH 200-200-20 MG/5ML PO SUSP
15.0000 mL | ORAL | Status: DC | PRN
  Administered 2017-10-02: 15 mL via ORAL
  Filled 2017-10-02: qty 30

## 2017-10-02 MED ORDER — OXYCODONE HCL 5 MG PO TABS: 5.0000 mg | ORAL_TABLET | Freq: Four times a day (QID) | ORAL | 0 refills | Status: DC | PRN

## 2017-10-02 NOTE — Discharge Instructions (Signed)
[facility] ______[facility] °LAPAROSCOPIC SURGERY: POST OP INSTRUCTIONS °Always review your discharge instruction sheet given to you by the facility where your surgery was performed. °IF YOU HAVE DISABILITY OR FAMILY LEAVE FORMS, YOU MUST BRING THEM TO THE OFFICE FOR PROCESSING.   °DO NOT GIVE THEM TO YOUR DOCTOR. ° °1. A prescription for pain medication may be given to you upon discharge.  Take your pain medication as prescribed, if needed.  If narcotic pain medicine is not needed, then you may take acetaminophen (Tylenol) or ibuprofen (Advil) as needed. °2. Take your usually prescribed medications unless otherwise directed. °3. If you need a refill on your pain medication, please contact your pharmacy.  They will contact our office to request authorization. Prescriptions will not be filled after 5pm or on week-ends. °4. You should follow a light diet the first few days after arrival home, such as soup and crackers, etc.  Be sure to include lots of fluids daily. °5. Most patients will experience some swelling and bruising in the area of the incisions.  Ice packs will help.  Swelling and bruising can take several days to resolve.  °6. It is common to experience some constipation if taking pain medication after surgery.  Increasing fluid intake and taking a stool softener (such as Colace) will usually help or prevent this problem from occurring.  A mild laxative (Milk of Magnesia or Miralax) should be taken according to package instructions if there are no bowel movements after 48 hours. °7. Unless discharge instructions indicate otherwise, you may remove your bandages 24-48 hours after surgery, and you may shower at that time.  You may have steri-strips (small skin tapes) in place directly over the incision.  These strips should be left on the skin for 7-10 days.  If your surgeon used skin glue on the incision, you may shower in 24 hours.  The glue will flake off over the next 2-3 weeks.  Any sutures or  staples will be removed at the office during your follow-up visit. °8. ACTIVITIES:  You may resume regular (light) daily activities beginning the next day--such as daily self-care, walking, climbing stairs--gradually increasing activities as tolerated.  You may have sexual intercourse when it is comfortable.  Refrain from any heavy lifting or straining until approved by your doctor. °a. You may drive when you are no longer taking prescription pain medication, you can comfortably wear a seatbelt, and you can safely maneuver your car and apply brakes. °b. RETURN TO WORK:  __________________________________________________________ °9. You should see your doctor in the office for a follow-up appointment approximately 2-3 weeks after your surgery.  Make sure that you call for this appointment within a day or two after you arrive home to insure a convenient appointment time. °10. OTHER INSTRUCTIONS: __________________________________________________________________________________________________________________________ __________________________________________________________________________________________________________________________ °WHEN TO CALL YOUR DOCTOR: °1. Fever over 101.0 °2. Inability to urinate °3. Continued bleeding from incision. °4. Increased pain, redness, or drainage from the incision. °5. Increasing abdominal pain ° °The clinic staff is available to answer your questions during regular business hours.  Please don’t hesitate to call and ask to speak to one of the nurses for clinical concerns.  If you have a medical emergency, go to the nearest emergency room or call 911.  A surgeon from [facility] is always on call at the hospital. °[address], [address], [place], [place]  [address] ? [address] [address], [place], [place]   [address] °[phone] ? [phone] ? FAX [phone] °Web site:   [email or web address] °

## 2017-10-02 NOTE — Final Consult Note (Signed)
Consultant Final Sign-Off Note    Assessment/Final recommendations  Haley Freeman is a 42 y.o. female followed by me for biliary cholic. She is clear for discharge from a surgical standpoint.     Wound care (if applicable): can shower over glue on incisions, no bathing for 2 weeks   Diet at discharge: regular diet   Activity at discharge: one week off work, no lifting >20lbs for 3 weeks   Follow-up appointment:     Pending results:  Unresulted Labs (From admission, onward)   None       Medication recommendations: prescription in chart   Other recommendations:    Thank you for allowing [name] to participate in the care of your patient!  Please consult [name] again if you have further needs for your patient.  Kalman Drape 10/02/2017 8:25 AM    Subjective   CC: biliary cholic  Pt is having pain at umbilical incision and mild nausea this am. She tolerated her diet last evening with no reproduction of her original abdominal pain. She has been walking, up to urinate, and having flatus.   Objective  Vital signs in last 24 hours: Temp:  [98 F (36.7 C)-99 F (37.2 C)] 98.7 F (37.1 C) (09/26 0505) Pulse Rate:  [77-106] 77 (09/26 0505) Resp:  [15-20] 17 (09/25 1940) BP: (103-152)/(62-99) 126/84 (09/26 0505) SpO2:  [97 %-100 %] 97 % (09/26 0505)  PE: Gen: Alert, NAD, pleasant, cooperative Pulm:Rate andeffort normal Abd: Soft,ND, obese,+BS, mild TTP around umbilicus. Incisions with glue intact appear well without signs of infection. no peritonitis Skin: no rashes noted, warm and dry  Pertinent labs and Studies: Recent Labs    09/30/17 0602 10/02/17 0546  WBC 12.3* 17.8*  HGB 10.6* 9.9*  HCT 35.1* 32.7*   BMET Recent Labs    09/30/17 0602 10/02/17 0546  NA 140 139  K 4.2 3.9  CL 107 107  CO2 25 27  GLUCOSE 108* 120*  BUN 6 9  CREATININE 1.17* 1.00  CALCIUM 9.0 9.3   No results for input(s): LABURIN in the last 72 hours. Results for orders  placed or performed during the hospital encounter of 09/26/17  Surgical pcr screen     Status: Abnormal   Collection Time: 09/30/17 10:30 PM  Result Value Ref Range Status   MRSA, PCR NEGATIVE NEGATIVE Final   Staphylococcus aureus POSITIVE (A) NEGATIVE Final    Comment: (NOTE) The Xpert SA Assay (FDA approved for NASAL specimens in patients 59 years of age and older), is one component of a comprehensive surveillance program. It is not intended to diagnose infection nor to guide or monitor treatment. Performed at [facility], [place] [address], [place], [place] [address]     Imaging: No results found.

## 2017-10-02 NOTE — Anesthesia Postprocedure Evaluation (Signed)
Anesthesia Post Note  Patient: Haley Freeman  Procedure(s) Performed: LAPAROSCOPIC CHOLECYSTECTOMY WITH POSSIBLE  INTRAOPERATIVE CHOLANGIOGRAM (N/A )     Patient location during evaluation: PACU Anesthesia Type: General Level of consciousness: awake and alert, awake and oriented Pain management: pain level controlled Vital Signs Assessment: post-procedure vital signs reviewed and stable Respiratory status: spontaneous breathing, nonlabored ventilation and respiratory function stable Cardiovascular status: blood pressure returned to baseline and stable Postop Assessment: no apparent nausea or vomiting Anesthetic complications: no    Last Vitals:  Vitals:   10/01/17 1940 10/02/17 0505  BP: 112/79 126/84  Pulse: (!) 106 77  Resp: 17   Temp: 37.2 C 37.1 C  SpO2: 99% 97%    Last Pain:  Vitals:   10/02/17 0800  TempSrc:   PainSc: [place]

## 2017-10-02 NOTE — Discharge Summary (Signed)
Physician Discharge Summary  Haley Freeman HAL:[id number] DOB: [birth year] DOA: 09/26/2017  PCP: [facility]  Admit date: 09/26/2017 Discharge date: 10/02/2017  Admitted From: Home Disposition:  Home  Recommendations for Outpatient Follow-up:  1. Follow up with PCP in 1 week 2. Follow-up with general surgery as an outpatient.  Wound care as per general surgery 3. Follow-up with GI/Dr. Oletta Lamas as an outpatient   Home Health: No Equipment/Devices: None  Discharge Condition: Stable CODE STATUS: Full Diet recommendation: Regular   Brief/Interim Summary: 42 year old female with history of prior alcohol abuse, marijuana use, anxiety presented on 09/26/2017 with intractable nausea and vomiting for about a month now with 20 pound weight loss.  Recent EGD was unrevealing.  GI was consulted.  HIDA scan was abnormal.  General surgery was consulted.  Patient had laparoscopic cholecystectomy on 10/01/2017.  General surgery has cleared the patient for discharge.  Patient will be discharged home.   Discharge Diagnoses:  Principal Problem:   Intractable nausea and vomiting Active Problems:   Hypokalemia   Elevated LFTs   Abnormal biliary HIDA scan   Marijuana abuse  Intractable nausea and vomiting with leukocytosis/elevated LFTs/abnormal biliary HIDA scan -Hepatitis panel negative.  CT enterography was negative.  Head CT was negative for mass lesion.  GI has signed off -Status post cholecystectomy by general surgery on 10/01/2017.  General surgery has cleared the patient for discharge.  Patient is tolerating diet.  Discharge patient home with outpatient follow-up with general surgery and GI.  Leukocytosis -Probably reactive.    Outpatient follow-up  Marijuana abuse -Noted on UDS  Anemia of chronic disease -Monitor.  Hemoglobin stable  Discharge Instructions  Discharge Instructions    Call MD for:  difficulty breathing, headache or visual disturbances   Complete by:   As directed    Call MD for:  extreme fatigue   Complete by:  As directed    Call MD for:  hives   Complete by:  As directed    Call MD for:  persistant dizziness or light-headedness   Complete by:  As directed    Call MD for:  persistant nausea and vomiting   Complete by:  As directed    Call MD for:  redness, tenderness, or signs of infection (pain, swelling, redness, odor or green/yellow discharge around incision site)   Complete by:  As directed    Call MD for:  temperature >100.4   Complete by:  As directed    Diet general   Complete by:  As directed    Increase activity slowly   Complete by:  As directed      Allergies as of 10/02/2017      Reactions   Strawberry (diagnostic) Shortness Of Breath   Penicillins Swelling   Swells throat. Has patient had a PCN reaction causing immediate rash, facial/tongue/throat swelling, SOB or lightheadedness with hypotension: Yes Has patient had a PCN reaction causing severe rash involving mucus membranes or skin necrosis: No Has patient had a PCN reaction that required hospitalization: Yes Has patient had a PCN reaction occurring within the last 10 years: No If all of the above answers are "NO", then may proceed with Cephalosporin use.      Medication List    STOP taking these medications   dicyclomine 20 MG tablet Commonly known as:  BENTYL   omeprazole 20 MG capsule Commonly known as:  PRILOSEC   oxyCODONE-acetaminophen 5-325 MG tablet Commonly known as:  PERCOCET/ROXICET     TAKE these medications  alum & mag hydroxide-simeth 200-200-20 MG/5ML suspension Commonly known as:  MAALOX/MYLANTA Take 15 mLs by mouth every 4 (four) hours as needed for indigestion or heartburn.   hydrOXYzine 25 MG tablet Commonly known as:  ATARAX/VISTARIL Take 50 mg by mouth at bedtime as needed for nausea or vomiting.   metoCLOPramide 5 MG tablet Commonly known as:  REGLAN Take 5 mg by mouth 3 (three) times daily before meals.   ondansetron  4 MG disintegrating tablet Commonly known as:  ZOFRAN-ODT Take 1 tablet (4 mg total) by mouth every 8 (eight) hours as needed. What changed:  reasons to take this   oxyCODONE 5 MG immediate release tablet Commonly known as:  Oxy IR/ROXICODONE Take 1 tablet (5 mg total) by mouth every 6 (six) hours as needed for moderate pain or severe pain (for pain not relieved by PO tylenol).   pantoprazole 40 MG tablet Commonly known as:  PROTONIX Take 1 tablet (40 mg total) by mouth 2 (two) times daily.   promethazine 25 MG tablet Commonly known as:  PHENERGAN Take 1 tablet (25 mg total) by mouth every 6 (six) hours as needed for nausea or vomiting.   ranitidine 300 MG tablet Commonly known as:  ZANTAC Take 1 tablet (300 mg total) by mouth at bedtime.   traZODone 100 MG tablet Commonly known as:  DESYREL Take 300 mg by mouth at bedtime.      Follow-up [facility] Follow up.   Why:  OR this can become your primary doctor Contact information: [place] [address] [phone]       [facility] Follow up.   Why:  OR this can become your primary doctor Contact information: [place] [address] [phone]       [facility] Follow up on 10/16/2017.   Specialty:  General Surgery Why:  your appointment is at  2 pm. Be at the office 30 minutes early for check in.  Bring photo ID and insurance information.   Contact information: [place] [address] [phone]        Laurence Spates, MD. Schedule an appointment as soon as possible for a visit in 1 week(s).   Specialty:  Gastroenterology Contact information: [address] [place] [address] [phone]          Allergies  Allergen Reactions  . Strawberry (Diagnostic) Shortness Of Breath  . Penicillins  Swelling    Swells throat. Has patient had a PCN reaction causing immediate rash, facial/tongue/throat swelling, SOB or lightheadedness with hypotension: Yes Has patient had a PCN reaction causing severe rash involving mucus membranes or skin necrosis: No Has patient had a PCN reaction that required hospitalization: Yes Has patient had a PCN reaction occurring within the last 10 years: No If all of the above answers are "NO", then may proceed with Cephalosporin use.     Consultations: General surgery/GI  Procedures/Studies: Ct Head W & Wo Contrast  Result Date: 09/28/2017 CLINICAL DATA:  42 year old female with unexplained nausea vomiting. EXAM: CT HEAD WITHOUT AND WITH CONTRAST TECHNIQUE: Contiguous axial images were obtained from the base of the skull through the vertex without and with intravenous contrast CONTRAST:  17mL OMNIPAQUE IOHEXOL 300 MG/ML  SOLN COMPARISON:  Report of neck CT 03/18/2001 (no images available). FINDINGS: Brain: Normal cerebral volume. Possible partially empty sella. No midline shift,  ventriculomegaly, mass effect, evidence of mass lesion, intracranial hemorrhage or evidence of cortically based acute infarction. Gray-white matter differentiation is within normal limits throughout the brain. No abnormal enhancement identified. Vascular: The major intracranial vascular structures appear to be enhancing and patent. Skull: Negative. Sinuses/Orbits: Partially visible maxillary mucoperiosteal thickening. Other paranasal sinuses and mastoids are well pneumatized. Other: Visualized orbit soft tissues are within normal limits. Visualized scalp soft tissues are within normal limits. IMPRESSION: Normal CT appearance of the brain aside from a possible partially empty sella, which is often a normal anatomic variant but can be associated with idiopathic intracranial hypertension (pseudotumor cerebri) Electronically Signed   By: Genevie Ann M.D.   On: 09/28/2017 10:45   Ct Abdomen Pelvis W  Contrast  Result Date: 09/03/2017 CLINICAL DATA:  42 year old female postoperative laparoscopic surgery with left lower quadrant pain EXAM: CT ABDOMEN AND PELVIS WITH CONTRAST TECHNIQUE: Multidetector CT imaging of the abdomen and pelvis was performed using the standard protocol following bolus administration of intravenous contrast. CONTRAST:  138mL ISOVUE-300 IOPAMIDOL (ISOVUE-300) INJECTION 61% COMPARISON:  08/21/2017 FINDINGS: Lower chest: No acute abnormality. Hepatobiliary: Cranial caudal span of the right liver measures greater than 22 cm. Unchanged appearance of the vague hypodensity/hypoenhancing region at the posterior right liver, segment 7. This region was not imaged on the delay series. Unremarkable gallbladder. Pancreas: Unremarkable pancreas Spleen: Unremarkable spleen Adrenals/Urinary Tract: Unremarkable appearance of the adrenal glands. No evidence of hydronephrosis of the right or left kidney. No nephrolithiasis. Unremarkable course of the bilateral ureters. Unremarkable appearance of the urinary bladder. Stomach/Bowel: Unremarkable appearance of stomach. Unremarkable small bowel. Normal appendix. Minimal diverticular change without evidence of acute inflammation. Vascular/Lymphatic: No significant atherosclerotic changes. Unremarkable appearance of the mesenteric arteries and renal arteries. Bilateral iliac arteries and proximal femoral arteries patent. Reproductive: Interval surgical changes of right oophorectomy/ovarian mass resection. No local inflammatory changes. No hematoma or fluid. No lymphadenopathy. Unremarkable appearance of the uterus. Unremarkable left adnexa Other: Edema within the right lower abdominal wall soft tissues with no focal fluid. Fat containing umbilical hernia. Musculoskeletal: No acute displaced fracture. No significant degenerative changes of the spine. Mild degenerative changes of the hips. IMPRESSION: Interval surgical changes of right oophorectomy/mass resection  without complicating features. No acute CT finding to account for abdominal pain. Unchanged appearance of the focal hypodensity in segment 7 of the liver, which most likely represents hemangioma. Diverticular change without evidence of acute diverticulitis. Electronically Signed   By: Corrie Mckusick D.O.   On: 09/03/2017 10:42   Nm Hepato W/eject Fract  Result Date: 09/26/2017 CLINICAL DATA:  Nausea, vomiting EXAM: NUCLEAR MEDICINE HEPATOBILIARY IMAGING WITH GALLBLADDER EF TECHNIQUE: Sequential images of the abdomen were obtained out to 60 minutes following intravenous administration of radiopharmaceutical. After oral ingestion of Ensure, gallbladder ejection fraction was determined. At 60 min, normal ejection fraction is greater than 33%. RADIOPHARMACEUTICALS:  5.3 mCi Tc-58m  Choletec IV COMPARISON:  None. FINDINGS: Prompt uptake and biliary excretion of activity by the liver is seen. Gallbladder activity is visualized, consistent with patency of cystic duct. Biliary activity passes into small bowel, consistent with patent common bile duct. Calculated gallbladder ejection fraction is 9.2%. (Normal gallbladder ejection fraction with Ensure is greater than 33%.) IMPRESSION: No evidence of cystic duct or common bile duct obstruction. Low gallbladder ejection fraction, 9%. Electronically Signed   By: Rolm Baptise M.D.   On: 09/26/2017 16:06   Ct Entero Abd/pelvis W Contast  Result Date: 09/27/2017 CLINICAL DATA:  Upper abdominal pain, nausea, weight loss  EXAM: CT ABDOMEN AND PELVIS WITH CONTRAST (ENTEROGRAPHY) TECHNIQUE: Multidetector CT of the abdomen and pelvis during bolus administration of intravenous contrast. Negative oral contrast was given. CONTRAST:  127mL OMNIPAQUE IOHEXOL 300 MG/ML  SOLN COMPARISON:  Multiple priors, most recently CT abdomen/pelvis dated 09/03/2017 FINDINGS: Lower chest:  Lung bases are clear. Hepatobiliary: Liver is notable for a 1.8 cm hemangioma in segment 7 (series 3/image 15).  Gallbladder is unremarkable. No intrahepatic or extrahepatic ductal dilatation. Pancreas: Within normal limits. Spleen: Within normal limits. Adrenals/Urinary Tract: Adrenal glands are within normal limits. Kidneys are within normal limits. Partially duplicated left renal collecting system (series 9/image 30). No hydronephrosis. Bladder is within normal limits. Stomach/Bowel: Stomach is within normal limits. No evidence of bowel obstruction. Normal appendix (series 3/image 69). Vascular/Lymphatic: No evidence of abdominal aortic aneurysm. Circumaortic left renal vein. No suspicious abdominopelvic lymphadenopathy. Reproductive: Uterus is mildly heterogeneous, likely reflecting uterine fibroids, better evaluated ultrasound. Bilateral ovaries are within normal limits. Again noted is prior right ovarian dermoid resection. Other: No abdominopelvic ascites. Tiny fat containing periumbilical hernia. Musculoskeletal: Visualized osseous structures are within normal limits. IMPRESSION: No CT findings to account for the patient's abdominal pain. Stable ancillary findings as above. Electronically Signed   By: Julian Hy M.D.   On: 09/27/2017 13:28   Dg Abdomen Acute W/chest  Result Date: 09/06/2017 CLINICAL DATA:  42 year old female with history of abdominal pain, nausea and vomiting postoperatively. EXAM: DG ABDOMEN ACUTE W/ 1V CHEST COMPARISON:  Chest x-ray 08/21/2017. FINDINGS: Lung volumes are normal. No consolidative airspace disease. No pleural effusions. No pneumothorax. No pulmonary nodule or mass noted. Pulmonary vasculature and the cardiomediastinal silhouette are within normal limits. Gas and stool are seen scattered throughout the colon extending to the level of the distal rectum. No pathologic distension of small bowel is noted. No gross evidence of pneumoperitoneum. IMPRESSION: 1. Nonobstructive bowel gas pattern. 2. No pneumoperitoneum. 3. No radiographic evidence of acute cardiopulmonary disease.  Electronically Signed   By: Vinnie Langton M.D.   On: 09/06/2017 21:13   US Pelvic Complete W Transvaginal And Torsion R/o  Result Date: 09/03/2017 CLINICAL DATA:  Pelvic pain beginning this morning. History of right oophorectomy for dermoid cyst on August 22, 2017. EXAM: TRANSABDOMINAL AND TRANSVAGINAL ULTRASOUND OF PELVIS DOPPLER ULTRASOUND OF OVARIES TECHNIQUE: Both transabdominal and transvaginal ultrasound examinations of the pelvis were performed. Transabdominal technique was performed for global imaging of the pelvis including uterus, ovaries, adnexal regions, and pelvic cul-de-sac. It was necessary to proceed with endovaginal exam following the transabdominal exam to visualize the endometrium and left ovary and bilateral adnexal regions. Color and duplex Doppler ultrasound was utilized to evaluate blood flow to the ovaries. COMPARISON:  Pelvic ultrasound of August 21, 2017 FINDINGS: Uterus Measurements: 8.8 x 5.1 x 6.3 cm. Two fibroids are noted posteriorly in the uterine body. 1 measures 1.8 and the other 1.2 cm in greatest dimension. Endometrium Thickness: 12.7 mm.  No focal abnormality visualized. Right ovary Measurements: The right ovary is surgically absent. No right adnexal mass is observed. Left ovary Measurements: 2.8 x 2.1 x 3.1 cm. Vascularity of the left ovary is demonstrated with some difficulty with pulsed Doppler. It is felt to be within the limits of normal. The patient's body habitus as well as tenderness to palpation limits the study. No left ovarian mass is observed. Other findings No abnormal free fluid. IMPRESSION: Right oophorectomy for dermoid 12 days ago.  No right adnexal mass. As best as can be determined vascularity of the normal-appearing  left ovary is normal. No left adnexal mass or free pelvic fluid is observed. The uterus and endometrium exhibit no acute abnormalities. There are known posterior uterine fibroids. Electronically Signed   By: David  Martinique M.D.   On:  09/03/2017 07:16   US Abdomen Limited Ruq  Result Date: 09/06/2017 CLINICAL DATA:  42 year old female with abdominal pain and leukocytosis. EXAM: ULTRASOUND ABDOMEN LIMITED RIGHT UPPER QUADRANT COMPARISON:  Abdominal CT dated 09/03/2017 FINDINGS: Gallbladder: No gallstones or wall thickening visualized. No sonographic Murphy sign noted by sonographer. Common bile duct: Diameter: 2 mm Liver: The liver is unremarkable as visualized. Portal vein is patent on color Doppler imaging with normal direction of blood flow towards the liver. IMPRESSION: Unremarkable right upper quadrant ultrasound. Electronically Signed   By: Anner Crete M.D.   On: 09/06/2017 23:14     Subjective: Patient seen and examined at bedside.  She denies any overnight fever, nausea or vomiting.  She is tolerating diet and wants to go home.  Discharge Exam: Vitals:   10/01/17 1940 10/02/17 0505  BP: 112/79 126/84  Pulse: (!) 106 77  Resp: 17   Temp: 99 F (37.2 C) 98.7 F (37.1 C)  SpO2: 99% 97%   Vitals:   10/01/17 1230 10/01/17 1259 10/01/17 1940 10/02/17 0505  BP: (!) 146/94 103/62 112/79 126/84  Pulse: 92 90 (!) 106 77  Resp: 19 20 17    Temp: 98 F (36.7 C) 98.9 F (37.2 C) 99 F (37.2 C) 98.7 F (37.1 C)  TempSrc:  Oral Oral Oral  SpO2: 100% 98% 99% 97%  Weight:      Height:        General: Pt is alert, awake, not in acute distress Cardiovascular: rate controlled, S1/S2 + Respiratory: bilateral decreased breath sounds at bases Abdominal: Soft, mildly tender around umbilicus, ND, bowel sounds + Extremities: no edema, no cyanosis    The results of significant diagnostics from this hospitalization (including imaging, microbiology, ancillary and laboratory) are listed below for reference.     Microbiology: Recent Results (from the past 240 hour(s))  Surgical pcr screen     Status: Abnormal   Collection Time: 09/30/17 10:30 PM  Result Value Ref Range Status   MRSA, PCR NEGATIVE NEGATIVE Final    Staphylococcus aureus POSITIVE (A) NEGATIVE Final    Comment: (NOTE) The Xpert SA Assay (FDA approved for NASAL specimens in patients 81 years of age and older), is one component of a comprehensive surveillance program. It is not intended to diagnose infection nor to guide or monitor treatment. Performed at [facility], [place] [address] [place], [place] [address]      Labs: BNP (last 3 results) No results for input(s): BNP in the last 8760 hours. Basic Metabolic Panel: Recent Labs  Lab 09/27/17 0614 09/28/17 0534 09/28/17 1413 09/30/17 0602 10/02/17 0546  NA 140 139 141 140 139  K 3.9 3.6 4.2 4.2 3.9  CL 106 106 110 107 107  CO2 28 25 23 25 27   GLUCOSE 103* 123* 107* 108* 120*  BUN 7 5* <5* 6 9  CREATININE 1.12* 1.05* 1.02* 1.17* 1.00  CALCIUM 8.7* 8.7* 9.0 9.0 9.3  MG  --   --   --   --  2.2   Liver Function Tests: Recent Labs  Lab 09/26/17 1119 09/27/17 0614 09/28/17 1413 10/02/17 0546  AST 90* 43* 24 33  ALT 122* 82* 54* 34  ALKPHOS 107 92 93 87  BILITOT 0.6 0.5 0.4 0.2*  PROT 7.8 6.4* 6.8 7.1  ALBUMIN 4.0 3.1* 3.3* 3.3*   Recent Labs  Lab 09/26/17 1119  LIPASE 28   No results for input(s): AMMONIA in the last 168 hours. CBC: Recent Labs  Lab 09/26/17 1119 09/27/17 0614 09/29/17 0544 09/30/17 0602 10/02/17 0546  WBC 16.2* 13.7* 11.7* 12.3* 17.8*  NEUTROABS  --   --   --   --  13.5*  HGB 10.7* 10.1* 9.8* 10.6* 9.9*  HCT 35.0* 33.7* 32.3* 35.1* 32.7*  MCV 70.3* 72.8* 72.3* 71.2* 72.0*  PLT 343 265 279 303 320   Cardiac Enzymes: No results for input(s): CKTOTAL, CKMB, CKMBINDEX, TROPONINI in the last 168 hours. BNP: Invalid input(s): POCBNP CBG: No results for input(s): GLUCAP in the last 168 hours. D-Dimer No results for input(s): DDIMER in the last 72 hours. Hgb A1c No results for input(s): HGBA1C in the last 72 hours. Lipid Profile No results for input(s): CHOL, HDL, LDLCALC, TRIG, CHOLHDL, LDLDIRECT in the last 72  hours. Thyroid function studies No results for input(s): TSH, T4TOTAL, T3FREE, THYROIDAB in the last 72 hours.  Invalid input(s): FREET3 Anemia work up No results for input(s): VITAMINB12, FOLATE, FERRITIN, TIBC, IRON, RETICCTPCT in the last 72 hours. Urinalysis    Component Value Date/Time   COLORURINE YELLOW 09/26/2017 1021   APPEARANCEUR CLOUDY (A) 09/26/2017 1021   LABSPEC 1.016 09/26/2017 1021   PHURINE 6.0 09/26/2017 1021   GLUCOSEU NEGATIVE 09/26/2017 1021   HGBUR SMALL (A) 09/26/2017 1021   BILIRUBINUR NEGATIVE 09/26/2017 1021   KETONESUR NEGATIVE 09/26/2017 1021   PROTEINUR 100 (A) 09/26/2017 1021   UROBILINOGEN 0.2 09/22/2014 0730   NITRITE NEGATIVE 09/26/2017 1021   LEUKOCYTESUR LARGE (A) 09/26/2017 1021   Sepsis Labs Invalid input(s): PROCALCITONIN,  WBC,  LACTICIDVEN Microbiology Recent Results (from the past 240 hour(s))  Surgical pcr screen     Status: Abnormal   Collection Time: 09/30/17 10:30 PM  Result Value Ref Range Status   MRSA, PCR NEGATIVE NEGATIVE Final   Staphylococcus aureus POSITIVE (A) NEGATIVE Final    Comment: (NOTE) The Xpert SA Assay (FDA approved for NASAL specimens in patients 47 years of age and older), is one component of a comprehensive surveillance program. It is not intended to diagnose infection nor to guide or monitor treatment. Performed at [facility], [place] [address] [place],  [address]      Time coordinating discharge: 35 minutes  SIGNED:   Aline August, MD  [facility] 10/02/2017, 2:23 PM Pager: [phone]  If 7PM-7AM, please contact night-coverage [email or web address] Password [email or web address]

## 2017-10-03 ENCOUNTER — Encounter (HOSPITAL_COMMUNITY): Payer: Self-pay

## 2017-10-15 DIAGNOSIS — R112 Nausea with vomiting, unspecified: Secondary | ICD-10-CM | POA: Diagnosis not present

## 2017-10-15 DIAGNOSIS — R1013 Epigastric pain: Secondary | ICD-10-CM | POA: Diagnosis not present

## 2017-10-15 DIAGNOSIS — Z6841 Body Mass Index (BMI) 40.0 and over, adult: Secondary | ICD-10-CM | POA: Diagnosis not present

## 2017-10-16 DIAGNOSIS — F319 Bipolar disorder, unspecified: Secondary | ICD-10-CM | POA: Diagnosis not present

## 2017-10-16 DIAGNOSIS — F4310 Post-traumatic stress disorder, unspecified: Secondary | ICD-10-CM | POA: Diagnosis not present

## 2017-10-24 ENCOUNTER — Emergency Department (HOSPITAL_COMMUNITY): Payer: Medicare Other

## 2017-10-24 ENCOUNTER — Encounter (HOSPITAL_COMMUNITY): Payer: Self-pay | Admitting: Emergency Medicine

## 2017-10-24 ENCOUNTER — Inpatient Hospital Stay (HOSPITAL_COMMUNITY)
Admission: EM | Admit: 2017-10-24 | Discharge: 2017-11-01 | DRG: 392 | Disposition: A | Discharge status: Home / Self Care | Payer: Medicare Other | Attending: Internal Medicine | Admitting: Internal Medicine

## 2017-10-24 DIAGNOSIS — K3184 Gastroparesis: Principal | ICD-10-CM | POA: Diagnosis present

## 2017-10-24 DIAGNOSIS — R112 Nausea with vomiting, unspecified: Secondary | ICD-10-CM | POA: Diagnosis present

## 2017-10-24 DIAGNOSIS — Z87891 Personal history of nicotine dependence: Secondary | ICD-10-CM

## 2017-10-24 DIAGNOSIS — R1013 Epigastric pain: Secondary | ICD-10-CM | POA: Diagnosis not present

## 2017-10-24 DIAGNOSIS — K7689 Other specified diseases of liver: Secondary | ICD-10-CM | POA: Diagnosis not present

## 2017-10-24 DIAGNOSIS — D72829 Elevated white blood cell count, unspecified: Secondary | ICD-10-CM | POA: Diagnosis present

## 2017-10-24 DIAGNOSIS — F329 Major depressive disorder, single episode, unspecified: Secondary | ICD-10-CM | POA: Diagnosis present

## 2017-10-24 DIAGNOSIS — R000 Tachycardia, unspecified: Secondary | ICD-10-CM | POA: Diagnosis present

## 2017-10-24 DIAGNOSIS — F1210 Cannabis abuse, uncomplicated: Secondary | ICD-10-CM | POA: Diagnosis present

## 2017-10-24 DIAGNOSIS — R7989 Other specified abnormal findings of blood chemistry: Secondary | ICD-10-CM | POA: Diagnosis present

## 2017-10-24 DIAGNOSIS — Z91018 Allergy to other foods: Secondary | ICD-10-CM

## 2017-10-24 DIAGNOSIS — R1115 Cyclical vomiting syndrome unrelated to migraine: Secondary | ICD-10-CM

## 2017-10-24 DIAGNOSIS — D509 Iron deficiency anemia, unspecified: Secondary | ICD-10-CM | POA: Diagnosis present

## 2017-10-24 DIAGNOSIS — Z79899 Other long term (current) drug therapy: Secondary | ICD-10-CM

## 2017-10-24 DIAGNOSIS — K30 Functional dyspepsia: Secondary | ICD-10-CM | POA: Diagnosis present

## 2017-10-24 DIAGNOSIS — E861 Hypovolemia: Secondary | ICD-10-CM | POA: Diagnosis present

## 2017-10-24 DIAGNOSIS — Z9049 Acquired absence of other specified parts of digestive tract: Secondary | ICD-10-CM

## 2017-10-24 DIAGNOSIS — Z880 Allergy status to penicillin: Secondary | ICD-10-CM

## 2017-10-24 LAB — URINALYSIS, ROUTINE W REFLEX MICROSCOPIC
BILIRUBIN URINE: NEGATIVE
Glucose, UA: NEGATIVE mg/dL
Hgb urine dipstick: NEGATIVE
KETONES UR: 20 mg/dL — AB
LEUKOCYTES UA: NEGATIVE
NITRITE: NEGATIVE
Protein, ur: NEGATIVE mg/dL
pH: 6 (ref 5.0–8.0)

## 2017-10-24 LAB — COMPREHENSIVE METABOLIC PANEL
ALT: 26 U/L (ref 0–44)
ANION GAP: 10 (ref 5–15)
AST: 29 U/L (ref 15–41)
Albumin: 3.6 g/dL (ref 3.5–5.0)
Alkaline Phosphatase: 127 U/L — ABNORMAL HIGH (ref 38–126)
BILIRUBIN TOTAL: 0.2 mg/dL — AB (ref 0.3–1.2)
BUN: 9 mg/dL (ref 6–20)
CHLORIDE: 108 mmol/L (ref 98–111)
CO2: 21 mmol/L — ABNORMAL LOW (ref 22–32)
Calcium: 9.3 mg/dL (ref 8.9–10.3)
Creatinine, Ser: 1.04 mg/dL — ABNORMAL HIGH (ref 0.44–1.00)
Glucose, Bld: 127 mg/dL — ABNORMAL HIGH (ref 70–99)
POTASSIUM: 4.1 mmol/L (ref 3.5–5.1)
Sodium: 139 mmol/L (ref 135–145)
TOTAL PROTEIN: 8.4 g/dL — AB (ref 6.5–8.1)

## 2017-10-24 LAB — CBC
HCT: 37.3 % (ref 36.0–46.0)
HEMOGLOBIN: 11 g/dL — AB (ref 12.0–15.0)
MCH: 21.2 pg — ABNORMAL LOW (ref 26.0–34.0)
MCHC: 29.5 g/dL — AB (ref 30.0–36.0)
MCV: 72 fL — ABNORMAL LOW (ref 80.0–100.0)
NRBC: 0 % (ref 0.0–0.2)
Platelets: 483 10*3/uL — ABNORMAL HIGH (ref 150–400)
RBC: 5.18 MIL/uL — ABNORMAL HIGH (ref 3.87–5.11)
RDW: 18.4 % — AB (ref 11.5–15.5)
WBC: 23.5 10*3/uL — AB (ref 4.0–10.5)

## 2017-10-24 LAB — LIPASE, BLOOD: LIPASE: 30 U/L (ref 11–51)

## 2017-10-24 LAB — I-STAT CG4 LACTIC ACID, ED: Lactic Acid, Venous: 1.2 mmol/L (ref 0.5–1.9)

## 2017-10-24 LAB — HCG, SERUM, QUALITATIVE: PREG SERUM: NEGATIVE

## 2017-10-24 LAB — I-STAT BETA HCG BLOOD, ED (MC, WL, AP ONLY): HCG, QUANTITATIVE: 6.3 m[IU]/mL — AB (ref <5)

## 2017-10-24 MED ORDER — METOCLOPRAMIDE HCL 5 MG/ML IJ SOLN
10.0000 mg | Freq: Once | INTRAMUSCULAR | Status: AC
  Administered 2017-10-24: 10 mg via INTRAVENOUS
  Filled 2017-10-24: qty 2

## 2017-10-24 MED ORDER — MORPHINE SULFATE (PF) 2 MG/ML IV SOLN
2.0000 mg | Freq: Once | INTRAVENOUS | Status: AC
  Administered 2017-10-24: 2 mg via INTRAVENOUS
  Filled 2017-10-24: qty 1

## 2017-10-24 MED ORDER — IOPAMIDOL (ISOVUE-300) INJECTION 61%
100.0000 mL | Freq: Once | INTRAVENOUS | Status: AC | PRN
  Administered 2017-10-24: 100 mL via INTRAVENOUS

## 2017-10-24 MED ORDER — DIPHENHYDRAMINE HCL 50 MG/ML IJ SOLN
25.0000 mg | Freq: Once | INTRAMUSCULAR | Status: AC
  Administered 2017-10-24: 25 mg via INTRAVENOUS
  Filled 2017-10-24: qty 1

## 2017-10-24 MED ORDER — IOPAMIDOL (ISOVUE-300) INJECTION 61%
INTRAVENOUS | Status: AC
  Filled 2017-10-24: qty 100

## 2017-10-24 MED ORDER — SODIUM CHLORIDE 0.9 % IV BOLUS
1000.0000 mL | Freq: Once | INTRAVENOUS | Status: AC
  Administered 2017-10-24: 1000 mL via INTRAVENOUS

## 2017-10-24 MED ORDER — ONDANSETRON HCL 4 MG/2ML IJ SOLN
4.0000 mg | Freq: Once | INTRAMUSCULAR | Status: AC
  Administered 2017-10-24: 4 mg via INTRAVENOUS
  Filled 2017-10-24: qty 2

## 2017-10-24 MED ORDER — ONDANSETRON 4 MG PO TBDP
4.0000 mg | ORAL_TABLET | Freq: Once | ORAL | Status: AC | PRN
  Administered 2017-10-24: 4 mg via ORAL
  Filled 2017-10-24: qty 1

## 2017-10-24 NOTE — ED Notes (Signed)
Pt not sitting still during bp check will reassess after meds work

## 2017-10-24 NOTE — ED Notes (Signed)
Patient called this writer to room because she needed to use the restroom. This writer came in to assist patient, patient ran to restroom and I was unable to hand her a cup to get specimen. Patient is audibly retching.

## 2017-10-24 NOTE — ED Provider Notes (Signed)
[facility] DEPT Provider Note  CSN: [id number] Arrival date & time: 10/24/17  1313    History   Chief Complaint Chief Complaint  Patient presents with  . Emesis  . Abdominal Pain    HPI Haley Freeman is a 42 y.o. female with a medical history of cholecystitis (s/p cholecystectomy) who presented to the ED for abdominal pain x 2 days. Patient established with [facility].  Abdominal Pain   This is a new problem. The current episode started yesterday. The problem occurs constantly. The problem has not changed since onset.The pain is associated with an unknown (Denies recent medication changes, diet changes, sick contacts or new food exposures) factor. The pain is located in the epigastric region. The quality of the pain is sharp and aching. The pain is at a severity of 10/10. The pain is severe. Associated symptoms include nausea, vomiting and constipation. Pertinent negatives include fever, belching, diarrhea, flatus, hematochezia, melena, dysuria, frequency and arthralgias. Nothing aggravates the symptoms. The symptoms are relieved by vomiting. Past workup comments: seen by GI on 10/15/17 and 10/16/17 for post-op appointment. Patient had no pain at that time and reports positive feedback from that visit. Last abdominal imaging on 09/26/17 showed no acute findings to account for abdominal pain. Past medical history comments: cholecystecomy on 10/01/17.    Past Medical History:  Diagnosis Date  . Alcohol abuse   . Anxiety   . Depression   . Headache(784.0)   . Neuromuscular disorder [facility])     Patient Active Problem List   Diagnosis Date Noted  . Hypokalemia 09/27/2017  . Elevated LFTs 09/27/2017  . Abnormal biliary HIDA scan 09/27/2017  . Marijuana abuse 09/27/2017  . Intractable nausea and vomiting 09/26/2017  . Cannabis hyperemesis syndrome concurrent with and due to cannabis abuse ([place]) 09/16/2017  . Nausea and vomiting 09/06/2017  . Dermoid  08/22/2017    Past Surgical History:  Procedure Laterality Date  . CESAREAN SECTION  '99 and '08   x 2  . CHOLECYSTECTOMY N/A 10/01/2017   Procedure: LAPAROSCOPIC CHOLECYSTECTOMY WITH POSSIBLE  INTRAOPERATIVE CHOLANGIOGRAM;  Surgeon: Stark Klein, MD;  [facility];  Service: General;  Laterality: N/A;  . ESOPHAGOGASTRODUODENOSCOPY N/A 09/16/2017   Procedure: ESOPHAGOGASTRODUODENOSCOPY (EGD) WITH PROPOFOL;  Surgeon: Clarene Essex, MD;  Location: [facility];  Service: Endoscopy;  Laterality: N/A;  . LAPAROSCOPY Right 08/22/2017   Procedure: LAPAROSCOPY OPERATIVE removal of right ovary and right dermoid cyst;  Surgeon: Sloan Leiter, MD;  Location: [facility];  Service: Gynecology;  Laterality: Right;     OB History    Gravida  3   Para  3   Term      Preterm      AB      Living  3     SAB      TAB      Ectopic      Multiple      Live Births               Home Medications    Prior to Admission medications   Medication Sig Start Date End Date Taking? Authorizing Provider  alum & mag hydroxide-simeth (MAALOX/MYLANTA) 200-200-20 MG/5ML suspension Take 15 mLs by mouth every 4 (four) hours as needed for indigestion or heartburn. 10/02/17  Yes Aline August, MD  [employer] MELATONIN 5 MG TABS Use as directed 2 tablets in the mouth or throat daily.  10/08/17  Yes [provider]  mirtazapine (REMERON) 45 MG tablet  Take 45 mg by mouth at bedtime.  10/16/17  Yes [provider]  oxyCODONE (OXY IR/ROXICODONE) 5 MG immediate release tablet Take 1 tablet (5 mg total) by mouth every 6 (six) hours as needed for moderate pain or severe pain (for pain not relieved by PO tylenol). 10/02/17  Yes Focht, Jessica L, PA  pantoprazole (PROTONIX) 40 MG tablet Take 1 tablet (40 mg total) by mouth 2 (two) times daily. Patient taking differently: Take 40 mg by mouth 2 (two) times daily as needed (indegestion).  09/13/17  Yes Isla Pence, MD  promethazine (PHENERGAN) 25 MG tablet  Take 1 tablet (25 mg total) by mouth every 6 (six) hours as needed for nausea or vomiting. 09/25/17  Yes Carmin Muskrat, MD  traZODone (DESYREL) 100 MG tablet Take 300 mg by mouth at bedtime.    Yes [provider]  hydrOXYzine (ATARAX/VISTARIL) 25 MG tablet Take 50 mg by mouth at bedtime as needed for nausea or vomiting.  09/20/17   [provider]  metoCLOPramide (REGLAN) 5 MG tablet Take 5 mg by mouth 3 (three) times daily before meals. 09/16/17   [provider]  ondansetron (ZOFRAN ODT) 4 MG disintegrating tablet Take 1 tablet (4 mg total) by mouth every 8 (eight) hours as needed. Patient taking differently: Take 4 mg by mouth every 8 (eight) hours as needed for nausea or vomiting.  09/13/17   Isla Pence, MD  ranitidine (ZANTAC) 300 MG tablet Take 1 tablet (300 mg total) by mouth at bedtime. 09/09/17 10/09/17  Kinnie Feil, PA-C    Family History No family history on file.  Social History Social History   Tobacco Use  . Smoking status: Former Smoker    Packs/day: 0.50  . Smokeless tobacco: Never Used  Substance Use Topics  . Alcohol use: No  . Drug use: Yes    Types: Other-see comments, Marijuana    Comment: Occasionally smokes marijuana Reports last  time 09/11/17 but at party on 09/13/17     Allergies   Strawberry (diagnostic) and Penicillins   Review of Systems Review of Systems  Constitutional: Negative for chills and fever.  HENT: Negative.   Respiratory: Negative for choking, chest tightness and shortness of breath.   Cardiovascular: Negative for chest pain and palpitations.  Gastrointestinal: Positive for abdominal pain, constipation, nausea and vomiting. Negative for diarrhea, flatus, hematochezia and melena.  Genitourinary: Negative for dysuria and frequency.  Musculoskeletal: Negative for arthralgias.  Skin: Negative.   Allergic/Immunologic: Negative for food allergies.  Neurological: Negative.   Hematological: Negative.       Physical Exam Updated Vital Signs BP (!) 148/107 (BP Location: Left Arm)   Pulse (!) 108   Temp 99.2 F (37.3 C) (Oral)   Resp 18   LMP 09/15/2017   SpO2 99%   Physical Exam  Constitutional: She is cooperative.  Obese. Sitting on edge of bed dry heaving into emesis bag.  Neck: Normal range of motion and full passive range of motion without pain. Neck supple.  Cardiovascular: Normal rate, regular rhythm, normal heart sounds, intact distal pulses and normal pulses.  No murmur heard. Pulmonary/Chest: Effort normal and breath sounds normal.  Abdominal: Soft. Normal appearance and bowel sounds are normal. She exhibits no distension. There is tenderness in the epigastric area. There is guarding. There is no rigidity and no rebound.  Musculoskeletal: Normal range of motion.  Neurological: She is alert.  Skin: Skin is warm and intact. Capillary refill takes less than 2 seconds. She  is not diaphoretic. No pallor.  Nursing note and vitals reviewed.  ED Treatments / Results  Labs (all labs ordered are listed, but only abnormal results are displayed) Labs Reviewed  COMPREHENSIVE METABOLIC PANEL - Abnormal; Notable for the following components:      Result Value   CO2 21 (*)    Glucose, Bld 127 (*)    Creatinine, Ser 1.04 (*)    Total Protein 8.4 (*)    Alkaline Phosphatase 127 (*)    Total Bilirubin 0.2 (*)    All other components within normal limits  CBC - Abnormal; Notable for the following components:   WBC 23.5 (*)    RBC 5.18 (*)    Hemoglobin 11.0 (*)    MCV 72.0 (*)    MCH 21.2 (*)    MCHC 29.5 (*)    RDW 18.4 (*)    Platelets 483 (*)    All other components within normal limits  I-STAT BETA HCG BLOOD, ED ([facility], [facility], [facility] ONLY) - Abnormal; Notable for the following components:   I-stat hCG, quantitative 6.3 (*)    All other components within normal limits  LIPASE, BLOOD  URINALYSIS, ROUTINE W REFLEX MICROSCOPIC    EKG None  Radiology No results  found.  Procedures Procedures (including critical care time)  Medications Ordered in ED Medications  sodium chloride 0.9 % bolus 1,000 mL (has no administration in time range)  morphine 2 MG/ML injection 2 mg (has no administration in time range)  ondansetron (ZOFRAN) injection 4 mg (has no administration in time range)  ondansetron (ZOFRAN-ODT) disintegrating tablet 4 mg (4 mg Oral Given 10/24/17 1324)     Initial Impression / Assessment and Plan / ED Course  Triage vital signs and the nursing notes have been reviewed.  Pertinent labs & imaging results that were available during care of the patient were reviewed and considered in medical decision making (see chart for details).  Patient presents afebrile with complaints of abdominal pain and vomiting. Patient is 3 weeks s/p cholecystectomy. She reports that since the surgery that she has had minimal abdominal pain and no vomiting. She reports attending her GI and post-op appointments as scheduled and there have been no issues from those providers mentioned. However, she reports acute onset of symptoms yesterday. Denies any specific trigger such as new foods, diet or medication changes. She presents tachycardic which can likely be attributed to active vomiting and dry heaving upon arrival. She has epigastric tenderness with some guarding on physical exam no other significant findings. Will order typical abdominal labs for further evaluation as well as a CT abdomen given recent surgery.  Clinical Course as of Oct 24 2356  Fri Oct 24, 2017  1555 Mildly elevated I-stat hCG at 6.3. Likely false positive as patient had elevated reading 4 weeks ago. She has had normal menstrual cycles and no current GU complaints to suggest an ectopic or current pregnancy. Will order quant hCG for further evaluation.   [GM]  1604 Leukocytosis with WBC at 23.5 which is concerning for infectious etiology given physical exam and remaining labs. Also possible  obstruction in bile or pancreatic ducts due to elevated ALP.   [GM]  1733 EKG showed NSR. No ST elevations/depressions or signs of acute ischemia or infarct. This is reassuring in combination with negative troponin which assists in evaluating and ruling out an acute cardiac process.   [GM]  1938 Quant hCG negative.   [GM]  2025 CT showed no acute intra-abdominal process that  could be the source of patient's pain. No abscess formation, free fluid, perforation or biliary or pancreatic duct dilatation seen. 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   [GM]  2035 Still waiting on urine to be collected to evaluate for possible UTI.   [GM]  2153 Unremarkable UA and normal lactic acid. Will consult general surgery on additional recommendations for evaluation and treatment.   [GM]  2302 Case discussed with general surgeon, Dr. Excell Seltzer, who came down and evaluated the patient. Recommendation is that patient be admitted for HIDA scan for further evaluation. Dr. Excell Seltzer will order HIDA scan and follow patient on admission. Consult placed to [facility] for admission.   [GM]  2357 Case discussed with [facility] who will admit patient for HIDA.   [GM]    Clinical Course User Index [GM] Xayvion Shirah, Jonelle Sports, PA-C    Final Clinical Impressions(s) / ED Diagnoses  1. Epigastric Abdominal Pain. Case discussed with general surgery and [facility].  Dispo: Admit for HIDA scan.  Final diagnoses:  Epigastric pain    ED Discharge Orders    None        Romie Jumper, [place] 10/24/17 2359    Tegeler, Gwenyth Allegra, MD 10/25/17 0002

## 2017-10-24 NOTE — Consult Note (Signed)
Reason for Consult: Abdominal pain post cholecystectomy   Haley Freeman is an 42 y.o. female.  HPI: Patient is 3 weeks following laparoscopic cholecystectomy.  She presented with several months of recurrent severe nausea and vomiting associated with epigastric pain.  Also mild elevated LFTs and leukocytosis at presentation.  She had had a number of emergency room visits and an extensive negative work-up including negative ultrasound, CT scan and endoscopy.  GI was following.  She had a laparoscopic pelvic procedure a month or 2 before this for an ovarian problem.  CT enterography was negative.  HIDA was done preoperatively showing a low ejection fraction of 9%.  It was hoped that she had biliary dyskinesia and cholecystectomy would relieve her symptoms.  Gallbladder appeared relatively normal and no other abnormality seen at the time of laparoscopy.  She underwent an uneventful laparoscopic cholecystectomy.  She was doing fairly well until yesterday when after eating dinner she developed again the acute onset of sharp epigastric pain followed by frequent nausea and vomiting identical to her previous presentations.  No fever or chills.  States her bowel movements have been normal.  Denies recent alcohol use.  Past Medical History:  Diagnosis Date  . Alcohol abuse   . Anxiety   . Depression   . Headache(784.0)   . Neuromuscular disorder [facility])     Past Surgical History:  Procedure Laterality Date  . CESAREAN SECTION  '99 and '08   x 2  . CHOLECYSTECTOMY N/A 10/01/2017   Procedure: LAPAROSCOPIC CHOLECYSTECTOMY WITH POSSIBLE  INTRAOPERATIVE CHOLANGIOGRAM;  Surgeon: Stark Klein, MD;  [facility];  Service: General;  Laterality: N/A;  . ESOPHAGOGASTRODUODENOSCOPY N/A 09/16/2017   Procedure: ESOPHAGOGASTRODUODENOSCOPY (EGD) WITH PROPOFOL;  Surgeon: Clarene Essex, MD;  Location: [facility];  Service: Endoscopy;  Laterality: N/A;  . LAPAROSCOPY Right 08/22/2017   Procedure: LAPAROSCOPY  OPERATIVE removal of right ovary and right dermoid cyst;  Surgeon: Sloan Leiter, MD;  Location: [facility];  Service: Gynecology;  Laterality: Right;    No family history on file.  Social History:  reports that she has quit smoking. She smoked 0.50 packs per day. She has never used smokeless tobacco. She reports that she has current or past drug history. Drugs: Other-see comments and Marijuana. She reports that she does not drink alcohol.  Allergies:  Allergies  Allergen Reactions  . Strawberry (Diagnostic) Shortness Of Breath  . Penicillins Swelling    Swells throat. Has patient had a PCN reaction causing immediate rash, facial/tongue/throat swelling, SOB or lightheadedness with hypotension: Yes Has patient had a PCN reaction causing severe rash involving mucus membranes or skin necrosis: No Has patient had a PCN reaction that required hospitalization: Yes Has patient had a PCN reaction occurring within the last 10 years: No If all of the above answers are "NO", then may proceed with Cephalosporin use.     Current Facility-Administered Medications  Medication Dose Route Frequency Provider Last Rate Last Dose  . iopamidol (ISOVUE-300) 61 % injection            Current Outpatient Medications  Medication Sig Dispense Refill  . alum & mag hydroxide-simeth (MAALOX/MYLANTA) 200-200-20 MG/5ML suspension Take 15 mLs by mouth every 4 (four) hours as needed for indigestion or heartburn. 355 mL 0  . [employer] MELATONIN 5 MG TABS Use as directed 2 tablets in the mouth or throat daily.   2  . mirtazapine (REMERON) 45 MG tablet Take 45 mg by mouth at bedtime.   2  .  oxyCODONE (OXY IR/ROXICODONE) 5 MG immediate release tablet Take 1 tablet (5 mg total) by mouth every 6 (six) hours as needed for moderate pain or severe pain (for pain not relieved by PO tylenol). 20 tablet 0  . pantoprazole (PROTONIX) 40 MG tablet Take 1 tablet (40 mg total) by mouth 2 (two) times daily. (Patient taking differently: Take 40 mg  by mouth 2 (two) times daily as needed (indegestion). ) 20 tablet 0  . promethazine (PHENERGAN) 25 MG tablet Take 1 tablet (25 mg total) by mouth every 6 (six) hours as needed for nausea or vomiting. 20 tablet 0  . traZODone (DESYREL) 100 MG tablet Take 300 mg by mouth at bedtime.     . hydrOXYzine (ATARAX/VISTARIL) 25 MG tablet Take 50 mg by mouth at bedtime as needed for nausea or vomiting.   2  . metoCLOPramide (REGLAN) 5 MG tablet Take 5 mg by mouth 3 (three) times daily before meals.  1  . ondansetron (ZOFRAN ODT) 4 MG disintegrating tablet Take 1 tablet (4 mg total) by mouth every 8 (eight) hours as needed. (Patient taking differently: Take 4 mg by mouth every 8 (eight) hours as needed for nausea or vomiting. ) 10 tablet 0  . ranitidine (ZANTAC) 300 MG tablet Take 1 tablet (300 mg total) by mouth at bedtime. 30 tablet 0     Results for orders placed or performed during the hospital encounter of 10/24/17 (from the past 48 hour(s))  Lipase, blood     Status: None   Collection Time: 10/24/17  2:08 PM  Result Value Ref Range   Lipase 30 11 - 51 U/L    Comment: Performed at [facility], [place] [address], [place], [place] [address]  Comprehensive metabolic panel     Status: Abnormal   Collection Time: 10/24/17  2:08 PM  Result Value Ref Range   Sodium 139 135 - 145 mmol/L   Potassium 4.1 3.5 - 5.1 mmol/L   Chloride 108 98 - 111 mmol/L   CO2 21 (L) 22 - 32 mmol/L   Glucose, Bld 127 (H) 70 - 99 mg/dL   BUN 9 6 - 20 mg/dL   Creatinine, Ser 1.04 (H) 0.44 - 1.00 mg/dL   Calcium 9.3 8.9 - 10.3 mg/dL   Total Protein 8.4 (H) 6.5 - 8.1 g/dL   Albumin 3.6 3.5 - 5.0 g/dL   AST 29 15 - 41 U/L   ALT 26 0 - 44 U/L   Alkaline Phosphatase 127 (H) 38 - 126 U/L   Total Bilirubin 0.2 (L) 0.3 - 1.2 mg/dL   GFR calc non Af Amer >60 >60 mL/min   GFR calc Af Amer >60 >60 mL/min    Comment: (NOTE) The eGFR has been calculated using the CKD EPI equation. This calculation has not been  validated in all clinical situations. eGFR's persistently <60 mL/min signify possible Chronic Kidney Disease.    Anion gap 10 5 - 15    Comment: Performed at [facility], [place] [address] [place], [place] [address]  CBC     Status: Abnormal   Collection Time: 10/24/17  2:08 PM  Result Value Ref Range   WBC 23.5 (H) 4.0 - 10.5 K/uL   RBC 5.18 (H) 3.87 - 5.11 MIL/uL   Hemoglobin 11.0 (L) 12.0 - 15.0 g/dL   HCT 37.3 36.0 - 46.0 %   MCV 72.0 (L) 80.0 - 100.0 fL   MCH 21.2 (L) 26.0 - 34.0 pg  MCHC 29.5 (L) 30.0 - 36.0 g/dL   RDW 18.4 (H) 11.5 - 15.5 %   Platelets 483 (H) 150 - 400 K/uL   nRBC 0.0 0.0 - 0.2 %    Comment: Performed at [facility], [place] [address], [place], [place] [address]  I-Stat beta hCG blood, ED     Status: Abnormal   Collection Time: 10/24/17  2:15 PM  Result Value Ref Range   I-stat hCG, quantitative 6.3 (H) <5 mIU/mL   Comment 3            Comment:   GEST. AGE      CONC.  (mIU/mL)   <=1 WEEK        5 - 50     2 WEEKS       50 - 500     3 WEEKS       100 - 10,000     4 WEEKS     1,000 - 30,000        FEMALE AND NON-PREGNANT FEMALE:     LESS THAN 5 mIU/mL   hCG, serum, qualitative     Status: None   Collection Time: 10/24/17  6:11 PM  Result Value Ref Range   Preg, Serum NEGATIVE NEGATIVE    Comment:        THE SENSITIVITY OF THIS METHODOLOGY IS >10 mIU/mL. Performed at [facility], [place] [address] [place], [place] [address]   Urinalysis, Routine w reflex microscopic     Status: Abnormal   Collection Time: 10/24/17  9:21 PM  Result Value Ref Range   Color, Urine YELLOW YELLOW   APPearance CLEAR CLEAR   Specific Gravity, Urine >1.046 (H) 1.005 - 1.030   pH 6.0 5.0 - 8.0   Glucose, UA NEGATIVE NEGATIVE mg/dL   Hgb urine dipstick NEGATIVE NEGATIVE   Bilirubin Urine NEGATIVE NEGATIVE   Ketones, ur 20 (A) NEGATIVE mg/dL   Protein, ur NEGATIVE NEGATIVE mg/dL   Nitrite NEGATIVE NEGATIVE    Leukocytes, UA NEGATIVE NEGATIVE    Comment: Performed at [place] [address], [place], [place] [address]  I-Stat CG4 Lactic Acid, ED     Status: None   Collection Time: 10/24/17  9:28 PM  Result Value Ref Range   Lactic Acid, Venous 1.20 0.5 - 1.9 mmol/L    Ct Abdomen Pelvis W Contrast  Result Date: 10/24/2017 CLINICAL DATA:  Abdomen pain with nausea and vomiting gallbladder removal 10/01/2017 EXAM: CT ABDOMEN AND PELVIS WITH CONTRAST TECHNIQUE: Multidetector CT imaging of the abdomen and pelvis was performed using the standard protocol following bolus administration of intravenous contrast. CONTRAST:  192m ISOVUE-300 IOPAMIDOL (ISOVUE-300) INJECTION 61% COMPARISON:  CT 09/27/2017, 08/29 /2019, 08/21/2016 FINDINGS: Lower chest: Lung bases demonstrate no acute consolidation or effusion. Heart size upper limits normal. Small hiatal hernia. Hepatobiliary: Status post cholecystectomy. No biliary dilatation. Posterior right lobe hemangioma. Pancreas: Unremarkable. No pancreatic ductal dilatation or surrounding inflammatory changes. Spleen: Normal in size without focal abnormality. Adrenals/Urinary Tract: 2 cm left adrenal gland myelolipoma. Right adrenal gland is normal. Kidneys are normal, without renal calculi, focal lesion, or hydronephrosis. Bladder is unremarkable. Stomach/Bowel: Stomach is within normal limits. Appendix appears normal. No evidence of bowel wall thickening, distention, or inflammatory changes. Vascular/Lymphatic: No significant vascular findings are present. No enlarged abdominal or pelvic lymph nodes. Reproductive: Uterus and bilateral adnexa are unremarkable. Other: No abdominal wall hernia or abnormality. No abdominopelvic ascites. Musculoskeletal: No acute or significant osseous findings.  IMPRESSION: 1. No CT evidence for acute intra-abdominal or pelvic abnormality. 2. Vague hypodensity in the posterior right hepatic lobe, likely corresponding to  previously noted hemangioma 3. Interval cholecystectomy.  No biliary dilatation 4. 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 Electronically Signed   By: Donavan Foil M.D.   On: 10/24/2017 19:41    Review of Systems  Constitutional: Negative for chills and fever.  Cardiovascular: Negative.   Gastrointestinal: Positive for abdominal pain, nausea and vomiting. Negative for blood in stool, constipation and diarrhea.   Blood pressure (!) 165/121, pulse 98, temperature 99.1 F (37.3 C), temperature source Oral, resp. rate (!) 22, last menstrual period 09/15/2017, SpO2 97 %. Physical Exam General: Alert, morbidly obese African-American female, in no severe distress Skin: Warm and dry without rash or infection. HEENT: No palpable masses or thyromegaly. Sclera nonicteric.  Lungs: Breath sounds clear and equal without increased work of breathing Cardiovascular: Regular mild tachycardia. No edema Abdomen: Nondistended.  Obese.  There is epigastric tenderness but no guarding.  No peritoneal signs.  No masses palpable. No organomegaly. No palpable hernias. Extremities: No edema or joint swelling or deformity. No chronic venous stasis changes. Neurologic: Alert and fully oriented.  Affect appears normal.  No gross motor deficits.   Assessment/Plan: Recurrent episodic nausea and vomiting and epigastric pain with multiple episodes over several months, multiple ER visits and extensive previous work-up as detailed above all negative.  3 weeks following laparoscopic cholecystectomy for possible biliary dyskinesia but now has identical symptoms as preoperatively.  Difficult diagnosis, etiology unclear.  I do not feel she has a surgical problem.  I strongly doubt any postoperative surgical complication.  I am going to order a HIDA scan to absolutely rule out bile leak but there is no fluid seen on her CT scan and her abdomen shows no guarding or peritoneal signs.  Haley Freeman 10/24/2017, 11:09 PM

## 2017-10-24 NOTE — ED Triage Notes (Signed)
Pt c/o abd pains with n/v since waking up today. Pt reports thatt she had gallbladder out and ovary removed.

## 2017-10-25 ENCOUNTER — Other Ambulatory Visit: Payer: Self-pay

## 2017-10-25 ENCOUNTER — Encounter (HOSPITAL_COMMUNITY): Payer: Self-pay | Admitting: Internal Medicine

## 2017-10-25 ENCOUNTER — Observation Stay (HOSPITAL_COMMUNITY): Payer: Medicare Other

## 2017-10-25 DIAGNOSIS — K3184 Gastroparesis: Secondary | ICD-10-CM | POA: Diagnosis not present

## 2017-10-25 DIAGNOSIS — R1115 Cyclical vomiting syndrome unrelated to migraine: Secondary | ICD-10-CM | POA: Diagnosis present

## 2017-10-25 DIAGNOSIS — F1210 Cannabis abuse, uncomplicated: Secondary | ICD-10-CM | POA: Diagnosis present

## 2017-10-25 DIAGNOSIS — R1013 Epigastric pain: Secondary | ICD-10-CM | POA: Insufficient documentation

## 2017-10-25 DIAGNOSIS — K30 Functional dyspepsia: Secondary | ICD-10-CM | POA: Diagnosis not present

## 2017-10-25 DIAGNOSIS — R000 Tachycardia, unspecified: Secondary | ICD-10-CM | POA: Diagnosis present

## 2017-10-25 DIAGNOSIS — D509 Iron deficiency anemia, unspecified: Secondary | ICD-10-CM | POA: Diagnosis present

## 2017-10-25 DIAGNOSIS — Z91018 Allergy to other foods: Secondary | ICD-10-CM | POA: Diagnosis not present

## 2017-10-25 DIAGNOSIS — Z9049 Acquired absence of other specified parts of digestive tract: Secondary | ICD-10-CM | POA: Diagnosis not present

## 2017-10-25 DIAGNOSIS — R112 Nausea with vomiting, unspecified: Secondary | ICD-10-CM | POA: Diagnosis not present

## 2017-10-25 DIAGNOSIS — R7989 Other specified abnormal findings of blood chemistry: Secondary | ICD-10-CM | POA: Diagnosis present

## 2017-10-25 DIAGNOSIS — F329 Major depressive disorder, single episode, unspecified: Secondary | ICD-10-CM | POA: Diagnosis present

## 2017-10-25 DIAGNOSIS — Z79899 Other long term (current) drug therapy: Secondary | ICD-10-CM | POA: Diagnosis not present

## 2017-10-25 DIAGNOSIS — Z880 Allergy status to penicillin: Secondary | ICD-10-CM | POA: Diagnosis not present

## 2017-10-25 DIAGNOSIS — Z87891 Personal history of nicotine dependence: Secondary | ICD-10-CM | POA: Diagnosis not present

## 2017-10-25 DIAGNOSIS — E861 Hypovolemia: Secondary | ICD-10-CM | POA: Diagnosis present

## 2017-10-25 DIAGNOSIS — D72829 Elevated white blood cell count, unspecified: Secondary | ICD-10-CM | POA: Diagnosis not present

## 2017-10-25 LAB — HEPATIC FUNCTION PANEL
ALT: 32 U/L (ref 0–44)
AST: 36 U/L (ref 15–41)
Albumin: 4 g/dL (ref 3.5–5.0)
Alkaline Phosphatase: 124 U/L (ref 38–126)
BILIRUBIN DIRECT: 0.1 mg/dL (ref 0.0–0.2)
BILIRUBIN INDIRECT: 0 mg/dL — AB (ref 0.3–0.9)
BILIRUBIN TOTAL: 0.1 mg/dL — AB (ref 0.3–1.2)
Total Protein: 9.3 g/dL — ABNORMAL HIGH (ref 6.5–8.1)

## 2017-10-25 LAB — CBC WITH DIFFERENTIAL/PLATELET
Abs Immature Granulocytes: 0.2 10*3/uL — ABNORMAL HIGH (ref 0.00–0.07)
BASOS ABS: 0.1 10*3/uL (ref 0.0–0.1)
BASOS PCT: 0 %
EOS ABS: 0 10*3/uL (ref 0.0–0.5)
EOS PCT: 0 %
HCT: 37.3 % (ref 36.0–46.0)
HEMOGLOBIN: 10.9 g/dL — AB (ref 12.0–15.0)
Immature Granulocytes: 1 %
LYMPHS ABS: 3 10*3/uL (ref 0.7–4.0)
Lymphocytes Relative: 11 %
MCH: 21 pg — AB (ref 26.0–34.0)
MCHC: 29.2 g/dL — AB (ref 30.0–36.0)
MCV: 71.7 fL — ABNORMAL LOW (ref 80.0–100.0)
Monocytes Absolute: 0.4 10*3/uL (ref 0.1–1.0)
Monocytes Relative: 2 %
NEUTROS PCT: 86 %
Neutro Abs: 23.8 10*3/uL — ABNORMAL HIGH (ref 1.7–7.7)
Platelets: 550 10*3/uL — ABNORMAL HIGH (ref 150–400)
RBC: 5.2 MIL/uL — ABNORMAL HIGH (ref 3.87–5.11)
RDW: 18 % — AB (ref 11.5–15.5)
WBC: 27.5 10*3/uL — ABNORMAL HIGH (ref 4.0–10.5)
nRBC: 0 % (ref 0.0–0.2)

## 2017-10-25 LAB — RAPID URINE DRUG SCREEN, HOSP PERFORMED
Amphetamines: NOT DETECTED
BARBITURATES: NOT DETECTED
Benzodiazepines: NOT DETECTED
Cocaine: NOT DETECTED
Opiates: POSITIVE — AB
Tetrahydrocannabinol: POSITIVE — AB

## 2017-10-25 LAB — BASIC METABOLIC PANEL
ANION GAP: 11 (ref 5–15)
BUN: 10 mg/dL (ref 6–20)
CALCIUM: 9.6 mg/dL (ref 8.9–10.3)
CO2: 22 mmol/L (ref 22–32)
Chloride: 106 mmol/L (ref 98–111)
Creatinine, Ser: 1.02 mg/dL — ABNORMAL HIGH (ref 0.44–1.00)
GFR calc Af Amer: >60 mL/min (ref >60)
GLUCOSE: 135 mg/dL — AB (ref 70–99)
Potassium: 3.6 mmol/L (ref 3.5–5.1)
Sodium: 139 mmol/L (ref 135–145)

## 2017-10-25 LAB — TROPONIN I: Troponin I: <0.03 ng/mL (ref <0.03)

## 2017-10-25 LAB — HCG, QUANTITATIVE, PREGNANCY: hCG, Beta Chain, Quant, S: <1 m[IU]/mL (ref <5)

## 2017-10-25 MED ORDER — SODIUM CHLORIDE 0.9 % IV SOLN
INTRAVENOUS | Status: AC
  Administered 2017-10-25 – 2017-10-26 (×3): via INTRAVENOUS

## 2017-10-25 MED ORDER — ONDANSETRON HCL 4 MG/2ML IJ SOLN
4.0000 mg | Freq: Four times a day (QID) | INTRAMUSCULAR | Status: DC | PRN
  Administered 2017-10-25 – 2017-10-31 (×9): 4 mg via INTRAVENOUS
  Filled 2017-10-25 (×10): qty 2

## 2017-10-25 MED ORDER — GI COCKTAIL ~~LOC~~
30.0000 mL | Freq: Once | ORAL | Status: AC
  Administered 2017-10-25: 30 mL via ORAL
  Filled 2017-10-25: qty 30

## 2017-10-25 MED ORDER — CILIDINIUM-CHLORDIAZEPOXIDE 2.5-5 MG PO CAPS
1.0000 | ORAL_CAPSULE | Freq: Three times a day (TID) | ORAL | Status: DC
  Administered 2017-10-25 – 2017-11-01 (×17): 1 via ORAL
  Filled 2017-10-25 (×24): qty 1

## 2017-10-25 MED ORDER — HYDRALAZINE HCL 20 MG/ML IJ SOLN: 5.0000 mg | INTRAMUSCULAR | Status: DC | PRN

## 2017-10-25 MED ORDER — OXYCODONE HCL 5 MG PO TABS: 5.0000 mg | ORAL_TABLET | Freq: Four times a day (QID) | ORAL | Status: DC | PRN

## 2017-10-25 MED ORDER — ACETAMINOPHEN 650 MG RE SUPP: 650.0000 mg | Freq: Four times a day (QID) | RECTAL | Status: DC | PRN

## 2017-10-25 MED ORDER — ONDANSETRON HCL 4 MG PO TABS
4.0000 mg | ORAL_TABLET | Freq: Four times a day (QID) | ORAL | Status: DC | PRN
  Administered 2017-10-28: 4 mg via ORAL
  Filled 2017-10-25: qty 1

## 2017-10-25 MED ORDER — TRAZODONE HCL 50 MG PO TABS
300.0000 mg | ORAL_TABLET | Freq: Every day | ORAL | Status: DC
  Administered 2017-10-26 – 2017-10-31 (×6): 300 mg via ORAL
  Filled 2017-10-25 (×6): qty 6

## 2017-10-25 MED ORDER — METOCLOPRAMIDE HCL 5 MG/ML IJ SOLN
10.0000 mg | Freq: Once | INTRAMUSCULAR | Status: AC
  Administered 2017-10-25: 10 mg via INTRAVENOUS
  Filled 2017-10-25: qty 2

## 2017-10-25 MED ORDER — MIRTAZAPINE 30 MG PO TABS
45.0000 mg | ORAL_TABLET | Freq: Every day | ORAL | Status: DC
  Administered 2017-10-25 – 2017-10-31 (×7): 45 mg via ORAL
  Filled 2017-10-25 (×7): qty 1

## 2017-10-25 MED ORDER — MORPHINE SULFATE (PF) 2 MG/ML IV SOLN
1.0000 mg | INTRAVENOUS | Status: DC | PRN
  Administered 2017-10-25 – 2017-10-26 (×8): 1 mg via INTRAVENOUS
  Filled 2017-10-25 (×8): qty 1

## 2017-10-25 MED ORDER — DIPHENHYDRAMINE HCL 50 MG/ML IJ SOLN
12.5000 mg | Freq: Once | INTRAMUSCULAR | Status: AC
  Administered 2017-10-25: 12.5 mg via INTRAVENOUS
  Filled 2017-10-25: qty 1

## 2017-10-25 MED ORDER — ACETAMINOPHEN 325 MG PO TABS
650.0000 mg | ORAL_TABLET | Freq: Four times a day (QID) | ORAL | Status: DC | PRN
  Administered 2017-10-30: 650 mg via ORAL
  Filled 2017-10-25 (×3): qty 2

## 2017-10-25 MED ORDER — CALCIUM CARBONATE ANTACID 500 MG PO CHEW
1.0000 | CHEWABLE_TABLET | Freq: Three times a day (TID) | ORAL | Status: DC | PRN
  Administered 2017-10-25 – 2017-10-26 (×2): 200 mg via ORAL
  Filled 2017-10-25 (×2): qty 1

## 2017-10-25 MED ORDER — TECHNETIUM TC 99M MEBROFENIN IV KIT
5.1000 | PACK | Freq: Once | INTRAVENOUS | Status: AC
  Administered 2017-10-25: 5.1 via INTRAVENOUS

## 2017-10-25 NOTE — Consult Note (Signed)
[facility] Consult  Referring Provider: Mariel Aloe, MD Primary Care Physician:  Patient, No Pcp Per Primary Gastroenterologist: Dr.Edwards/[facility]  Reason for Consultation:  Epigastric abdominal pain and vomiting.  HPI: Haley Freeman is a 42 y.o. female was admitted yesterday with complains of epigastric pain and vomiting. On the last admission patient was advised abstinence from marijuana use for possible cyclical vomiting syndrome, but states that her last marijuana use was probably October 1st, after she was released post cholecystectomy. Patient states that she has epigastric abdominal pain which is improved after multiple episodes of bilious vomiting. Patient was discharged on oxycodone after cholecystectomy and states that she had only one pill left at home. Last EGD was performed on 09/16/17 which showed mild erythematous to adenopathy but otherwise was unremarkable. 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 HIDA scan today was unremarkable and showed no bile leak with a patent common bile duct.  Past Medical History:  Diagnosis Date  . Alcohol abuse   . Anxiety   . Depression   . Headache(784.0)   . Neuromuscular disorder [facility])     Past Surgical History:  Procedure Laterality Date  . CESAREAN SECTION  '99 and '08   x 2  . CHOLECYSTECTOMY N/A 10/01/2017   Procedure: LAPAROSCOPIC CHOLECYSTECTOMY WITH POSSIBLE  INTRAOPERATIVE CHOLANGIOGRAM;  Surgeon: Stark Klein, MD;  Location: [facility];  Service: General;  Laterality: N/A;  . ESOPHAGOGASTRODUODENOSCOPY N/A 09/16/2017   Procedure: ESOPHAGOGASTRODUODENOSCOPY (EGD) WITH PROPOFOL;  Surgeon: Clarene Essex, MD;  Location: [place];  Service: Endoscopy;  Laterality: N/A;  . LAPAROSCOPY Right 08/22/2017   Procedure:  LAPAROSCOPY OPERATIVE removal of right ovary and right dermoid cyst;  Surgeon: Sloan Leiter, MD;  Location: [place] [facility];  Service: Gynecology;  Laterality: Right;    Prior to Admission medications   Medication Sig Start Date End Date Taking? Authorizing Provider  alum & mag hydroxide-simeth (MAALOX/MYLANTA) 200-200-20 MG/5ML suspension Take 15 mLs by mouth every 4 (four) hours as needed for indigestion or heartburn. 10/02/17  Yes Aline August, MD  [employer] MELATONIN 5 MG TABS Use as directed 2 tablets in the mouth or throat daily.  10/08/17  Yes [provider]  mirtazapine (REMERON) 45 MG tablet Take 45 mg by mouth at bedtime.  10/16/17  Yes [provider]  oxyCODONE (OXY IR/ROXICODONE) 5 MG immediate release tablet Take 1 tablet (5 mg total) by mouth every 6 (six) hours as needed for moderate pain or severe pain (for pain not relieved by PO tylenol). 10/02/17  Yes Focht, Jessica L, PA  pantoprazole (PROTONIX) 40 MG tablet Take 1 tablet (40 mg total) by mouth 2 (two) times daily. Patient taking differently: Take 40 mg by mouth 2 (two) times daily as needed (indegestion).  09/13/17  Yes Isla Pence, MD  promethazine (PHENERGAN) 25 MG tablet Take 1 tablet (25 mg total) by mouth every 6 (six) hours as needed for nausea or vomiting. 09/25/17  Yes Carmin Muskrat, MD  traZODone (DESYREL) 100 MG tablet Take 300 mg by mouth at bedtime.    Yes [provider]  hydrOXYzine (ATARAX/VISTARIL) 25 MG tablet Take 50 mg by mouth at bedtime as needed for nausea or vomiting.  09/20/17   [provider]  metoCLOPramide (REGLAN) 5 MG tablet Take 5 mg by mouth 3 (three)  times daily before meals. 09/16/17   [provider]  ondansetron (ZOFRAN ODT) 4 MG disintegrating tablet Take 1 tablet (4 mg total) by mouth every 8 (eight) hours as needed. Patient taking differently: Take 4 mg by mouth every 8 (eight) hours as needed for nausea or vomiting.  09/13/17   Isla Pence, MD   ranitidine (ZANTAC) 300 MG tablet Take 1 tablet (300 mg total) by mouth at bedtime. 09/09/17 10/09/17  Kinnie Feil, PA-C    Current Facility-Administered Medications  Medication Dose Route Frequency Provider Last Rate Last Dose  . 0.9 %  sodium chloride infusion   Intravenous Continuous Rise Patience, MD 75 mL/hr at 10/25/17 1046    . acetaminophen (TYLENOL) tablet 650 mg  650 mg Oral Q6H PRN Rise Patience, MD       Or  . acetaminophen (TYLENOL) suppository 650 mg  650 mg Rectal Q6H PRN Rise Patience, MD      . hydrALAZINE (APRESOLINE) injection 5 mg  5 mg Intravenous Q4H PRN Rise Patience, MD      . mirtazapine (REMERON) tablet 45 mg  45 mg Oral QHS Rise Patience, MD      . morphine 2 MG/ML injection 1 mg  1 mg Intravenous Q4H PRN Rise Patience, MD   1 mg at 10/25/17 1047  . ondansetron (ZOFRAN) tablet 4 mg  4 mg Oral Q6H PRN Rise Patience, MD       Or  . ondansetron [facility]) injection 4 mg  4 mg Intravenous Q6H PRN Rise Patience, MD   4 mg at 10/25/17 0137  . traZODone (DESYREL) tablet 300 mg  300 mg Oral QHS Rise Patience, MD        Allergies as of 10/24/2017 - Review Complete 10/24/2017  Allergen Reaction Noted  . Strawberry (diagnostic) Shortness Of Breath 09/08/2017  . Penicillins Swelling 11/18/2010    Family History  Family history unknown: Yes    Social History   Socioeconomic History  . Marital status: Divorced    Spouse name: Not on file  . Number of children: Not on file  . Years of education: Not on file  . Highest education level: Not on file  Occupational History  . Not on file  Social Needs  . Financial resource strain: Not on file  . Food insecurity:    Worry: Not on file    Inability: Not on file  . Transportation needs:    Medical: Not on file    Non-medical: Not on file  Tobacco Use  . Smoking status: Former Smoker    Packs/day: 0.50  . Smokeless tobacco: Never Used  Substance  and Sexual Activity  . Alcohol use: No  . Drug use: Yes    Types: Other-see comments, Marijuana    Comment: Occasionally smokes marijuana Reports last  time 09/11/17 but at party on 09/13/17  . Sexual activity: Not Currently  Lifestyle  . Physical activity:    Days per week: Not on file    Minutes per session: Not on file  . Stress: Not on file  Relationships  . Social connections:    Talks on phone: Not on file    Gets together: Not on file    Attends religious service: Not on file    Active member of club or organization: Not on file    Attends meetings of clubs or organizations: Not on file    Relationship status: Not on file  .  Intimate partner violence:    Fear of current or ex partner: Not on file    Emotionally abused: Not on file    Physically abused: Not on file    Forced sexual activity: Not on file  Other Topics Concern  . Not on file  Social History Narrative  . Not on file   Review of Systems: GI: Described in detail in HPI.    Gen: Denies any fever, chills, rigors, night sweats, anorexia, fatigue, weakness, malaise, involuntary weight loss, and sleep disorder CV: Denies chest pain, angina, palpitations, syncope, orthopnea, PND, peripheral edema, and claudication. Resp: Denies dyspnea, cough, sputum, wheezing, coughing up blood. GU : Denies urinary burning, blood in urine, urinary frequency, urinary hesitancy, nocturnal urination, and urinary incontinence. MS: Denies joint pain or swelling.  Denies muscle weakness, cramps, atrophy.  Derm: Denies rash, itching, oral ulcerations, hives, unhealing ulcers.  Psych: Denies depression, anxiety, memory loss, suicidal ideation, hallucinations,  and confusion. Heme: Denies bruising, bleeding, and enlarged lymph nodes. Neuro:  Denies any headaches, dizziness, paresthesias. Endo:  Denies any problems with DM, thyroid, adrenal function.  Physical Exam: Vital signs in last 24 hours: Temp:  [98.9 F (37.2 C)-99.3 F (37.4 C)]  99 F (37.2 C) (10/19 1610) Pulse Rate:  [92-108] 100 (10/19 0632) Resp:  [16-32] 16 (10/19 9604) BP: (129-165)/(77-121) 151/91 (10/19 5409) SpO2:  [95 %-100 %] 100 % (10/19 8119) Weight:  [136.5 kg] 136.5 kg (10/19 0056) Last BM Date: 10/23/17  General:   Alert,  Well-developed, obese, pleasant and cooperative in NAD Head:  Normocephalic and atraumatic. Eyes:  Sclera clear, no icterus.   Conjunctiva pink. Ears:  Normal auditory acuity. Nose:  No deformity, discharge,  or lesions. Mouth:  No deformity or lesions.  Oropharynx pink & moist. Neck:  Supple; no masses or thyromegaly. Lungs:  Clear throughout to auscultation.   No wheezes, crackles, or rhonchi. No acute distress. Heart:  Regular rate and rhythm; no murmurs, clicks, rubs,  or gallops. Extremities:  Without clubbing or edema. Neurologic:  Alert and  oriented x4;  grossly normal neurologically. Skin:  Intact without significant lesions or rashes. Psych:  Alert and cooperative. Normal mood and affect. Abdomen:  Soft,mild epigastric tenderness and nondistended. No masses, hepatosplenomegaly or hernias noted. Normal bowel sounds, without guarding, and without rebound.         Lab Results: Recent Labs    10/24/17 1408 10/25/17 0115  WBC 23.5* 27.5*  HGB 11.0* 10.9*  HCT 37.3 37.3  PLT 483* 550*   BMET Recent Labs    10/24/17 1408 10/25/17 0115  NA 139 139  K 4.1 3.6  CL 108 106  CO2 21* 22  GLUCOSE 127* 135*  BUN 9 10  CREATININE 1.04* 1.02*  CALCIUM 9.3 9.6   LFT Recent Labs    10/25/17 0115  PROT 9.3*  ALBUMIN 4.0  AST 36  ALT 32  ALKPHOS 124  BILITOT 0.1*  BILIDIR 0.1  IBILI 0.0*   PT/INR No results for input(s): LABPROT, INR in the last 72 hours.  Studies/Results: Nm Hepatobiliary Liver Func  Result Date: 10/25/2017 CLINICAL DATA:  42 y/o F; cholecystectomy 10/01/2017. Epigastric pain with nausea and vomiting starting 2 days ago. Rule out bile leak. EXAM: NUCLEAR MEDICINE HEPATOBILIARY  IMAGING TECHNIQUE: Sequential images of the abdomen were obtained out to 60 minutes following intravenous administration of radiopharmaceutical. RADIOPHARMACEUTICALS:  5.1 mCi Tc-51m  Choletec IV COMPARISON:  10/24/2017 CT abdomen and pelvis. FINDINGS: Prompt uptake and biliary excretion of  activity by the liver is seen. No gallbladder activity is visualized. Biliary activity passes into small bowel, consistent with patent common bile duct. Reflux into the stomach is noted. No bile leak identified. IMPRESSION: No bile leak identified.  Patent common bile duct. Electronically Signed   By: Kristine Garbe M.D.   On: 10/25/2017 06:24   Ct Abdomen Pelvis W Contrast  Result Date: 10/24/2017 CLINICAL DATA:  Abdomen pain with nausea and vomiting gallbladder removal 10/01/2017 EXAM: CT ABDOMEN AND PELVIS WITH CONTRAST TECHNIQUE: Multidetector CT imaging of the abdomen and pelvis was performed using the standard protocol following bolus administration of intravenous contrast. CONTRAST:  120mL ISOVUE-300 IOPAMIDOL (ISOVUE-300) INJECTION 61% COMPARISON:  CT 09/27/2017, 08/29 /2019, 08/21/2016 FINDINGS: Lower chest: Lung bases demonstrate no acute consolidation or effusion. Heart size upper limits normal. Small hiatal hernia. Hepatobiliary: Status post cholecystectomy. No biliary dilatation. Posterior right lobe hemangioma. Pancreas: Unremarkable. No pancreatic ductal dilatation or surrounding inflammatory changes. Spleen: Normal in size without focal abnormality. Adrenals/Urinary Tract: 2 cm left adrenal gland myelolipoma. Right adrenal gland is normal. Kidneys are normal, without renal calculi, focal lesion, or hydronephrosis. Bladder is unremarkable. Stomach/Bowel: Stomach is within normal limits. Appendix appears normal. No evidence of bowel wall thickening, distention, or inflammatory changes. Vascular/Lymphatic: No significant vascular findings are present. No enlarged abdominal or pelvic lymph nodes.  Reproductive: Uterus and bilateral adnexa are unremarkable. Other: No abdominal wall hernia or abnormality. No abdominopelvic ascites. Musculoskeletal: No acute or significant osseous findings. IMPRESSION: 1. No CT evidence for acute intra-abdominal or pelvic abnormality. 2. Vague hypodensity in the posterior right hepatic lobe, likely corresponding to previously noted hemangioma 3. Interval cholecystectomy.  No biliary dilatation 4. 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 Electronically Signed   By: Donavan Foil M.D.   On: 10/24/2017 19:41    Impression: Nausea, vomiting and epigastric abdominal pain of unclear etiology Leukocytosis, microcytic anemia, thrombocytosis  Plan: Differentials to consider include IBS, hyperemesis and cyclical vomiting syndrome from marijuana use(during toxicology positive for opioids and THC), drug seeking behavior We'll start patient on Librax. Patient on Reglan X 1 dose, Zofran as needed and morphine for pain    LOS: 0 days   Ronnette Juniper, M.D.  10/25/2017, 1:14 PM  Pager [phone] If no answer or after 5 PM call [phone]

## 2017-10-25 NOTE — Progress Notes (Signed)
Pt with no iv access at this time;  Two IV Team RNs have attempted (4 att total), with ultrasound;  Pt's nurse made aware;  Please advise.  Thank you.   Raynelle Fanning RN IVTeam

## 2017-10-25 NOTE — H&P (Signed)
History and Physical    Haley Freeman ZHG:[id number] DOB: 10/16/1975 DOA: 10/24/2017  PCP: Patient, No Pcp Per  Patient coming from: Home.  Chief Complaint: Abdominal pain nausea vomiting.  HPI: Haley Freeman is a 42 y.o. female with previous history of alcohol abuse has not had any alcohol recently with history of marijuana abuse, depression who was recently admitted from abdominal pain at the time patient was diagnosed with possible biliary dyskinesia and had undergone cholecystectomy presents to the ER 3 weeks after the surgery with complaint of sudden onset of worsening abdominal pain since 2 days.  Pain is mostly epigastric radiating to the back with eventually developing nausea vomiting.  Has subjective feeling of fever chills.  Denies any diarrhea.  Denies any chest pain or shortness of breath.  Last time patient had melena was around 3 weeks ago after discharge.  ED Course: In the ER patient was found to have leukocytosis on blood work.  Temperature was 99 F.  Abdominal exam is tender in the epigastric area with no rebound tenderness or rigidity.  CT abdomen pelvis did not show any acute.  On-call general surgeon was consulted and admitted for further management and HIDA scan has been ordered to rule out bile leak.  Review of Systems: As per HPI, rest all negative.   Past Medical History:  Diagnosis Date  . Alcohol abuse   . Anxiety   . Depression   . Headache(784.0)   . Neuromuscular disorder [facility])     Past Surgical History:  Procedure Laterality Date  . CESAREAN SECTION  '99 and '08   x 2  . CHOLECYSTECTOMY N/A 10/01/2017   Procedure: LAPAROSCOPIC CHOLECYSTECTOMY WITH POSSIBLE  INTRAOPERATIVE CHOLANGIOGRAM;  Surgeon: Stark Klein, MD;  Location: [facility];  Service: General;  Laterality: N/A;  . ESOPHAGOGASTRODUODENOSCOPY N/A 09/16/2017   Procedure: ESOPHAGOGASTRODUODENOSCOPY (EGD) WITH PROPOFOL;  Surgeon: Clarene Essex, MD;  Location: [place];  Service:  Endoscopy;  Laterality: N/A;  . LAPAROSCOPY Right 08/22/2017   Procedure: LAPAROSCOPY OPERATIVE removal of right ovary and right dermoid cyst;  Surgeon: Sloan Leiter, MD;  Location: [place] [facility];  Service: Gynecology;  Laterality: Right;     reports that she has quit smoking. She smoked 0.50 packs per day. She has never used smokeless tobacco. She reports that she has current or past drug history. Drugs: Other-see comments and Marijuana. She reports that she does not drink alcohol.  Allergies  Allergen Reactions  . Strawberry (Diagnostic) Shortness Of Breath  . Penicillins Swelling    Swells throat. Has patient had a PCN reaction causing immediate rash, facial/tongue/throat swelling, SOB or lightheadedness with hypotension: Yes Has patient had a PCN reaction causing severe rash involving mucus membranes or skin necrosis: No Has patient had a PCN reaction that required hospitalization: Yes Has patient had a PCN reaction occurring within the last 10 years: No If all of the above answers are "NO", then may proceed with Cephalosporin use.     Family History  Family history unknown: Yes    Prior to Admission medications   Medication Sig Start Date End Date Taking? Authorizing Provider  alum & mag hydroxide-simeth (MAALOX/MYLANTA) 200-200-20 MG/5ML suspension Take 15 mLs by mouth every 4 (four) hours as needed for indigestion or heartburn. 10/02/17  Yes Aline August, MD  [employer] MELATONIN 5 MG TABS Use as directed 2 tablets in the mouth or throat daily.  10/08/17  Yes [provider]  mirtazapine (REMERON) 45 MG tablet Take 45 mg by  mouth at bedtime.  10/16/17  Yes [provider]  oxyCODONE (OXY IR/ROXICODONE) 5 MG immediate release tablet Take 1 tablet (5 mg total) by mouth every 6 (six) hours as needed for moderate pain or severe pain (for pain not relieved by PO tylenol). 10/02/17  Yes Focht, Jessica L, PA  pantoprazole (PROTONIX) 40 MG tablet Take 1 tablet (40 mg total) by mouth 2  (two) times daily. Patient taking differently: Take 40 mg by mouth 2 (two) times daily as needed (indegestion).  09/13/17  Yes Isla Pence, MD  promethazine (PHENERGAN) 25 MG tablet Take 1 tablet (25 mg total) by mouth every 6 (six) hours as needed for nausea or vomiting. 09/25/17  Yes Carmin Muskrat, MD  traZODone (DESYREL) 100 MG tablet Take 300 mg by mouth at bedtime.    Yes [provider]  hydrOXYzine (ATARAX/VISTARIL) 25 MG tablet Take 50 mg by mouth at bedtime as needed for nausea or vomiting.  09/20/17   [provider]  metoCLOPramide (REGLAN) 5 MG tablet Take 5 mg by mouth 3 (three) times daily before meals. 09/16/17   [provider]  ondansetron (ZOFRAN ODT) 4 MG disintegrating tablet Take 1 tablet (4 mg total) by mouth every 8 (eight) hours as needed. Patient taking differently: Take 4 mg by mouth every 8 (eight) hours as needed for nausea or vomiting.  09/13/17   Isla Pence, MD  ranitidine (ZANTAC) 300 MG tablet Take 1 tablet (300 mg total) by mouth at bedtime. 09/09/17 10/09/17  Kinnie Feil, PA-C    Physical Exam: Vitals:   10/24/17 1900 10/24/17 2221 10/25/17 0034 10/25/17 0034  BP: 129/77 (!) 165/121 139/86 (!) 160/99  Pulse: 100 98 (!) 104 99  Resp: (!) 32 (!) 22 18 20   Temp:  99.1 F (37.3 C)  98.9 F (37.2 C)  TempSrc:  Oral  Oral  SpO2: 95% 97% 100% 98%      Constitutional: Moderately built and nourished. Vitals:   10/24/17 1900 10/24/17 2221 10/25/17 0034 10/25/17 0034  BP: 129/77 (!) 165/121 139/86 (!) 160/99  Pulse: 100 98 (!) 104 99  Resp: (!) 32 (!) 22 18 20   Temp:  99.1 F (37.3 C)  98.9 F (37.2 C)  TempSrc:  Oral  Oral  SpO2: 95% 97% 100% 98%   Eyes: Anicteric no pallor. ENMT: No discharge from the ears eyes nose or mouth. Neck: No mass felt.  No neck rigidity.  No JVD appreciated. Respiratory: No rhonchi or crepitations. Cardiovascular: S1-S2 heard no murmurs appreciated. Abdomen: Soft mild epigastric  tenderness.  No rebound tenderness or rigidity. Musculoskeletal: No edema.  No joint effusion. Skin: No rash. Neurologic: Alert awake oriented to time place and person.  Moves all extremities. Psychiatric: Appears normal per normal affect.   Labs on Admission: I have personally reviewed following labs and imaging studies  CBC: Recent Labs  Lab 10/24/17 1408  WBC 23.5*  HGB 11.0*  HCT 37.3  MCV 72.0*  PLT 536*   Basic Metabolic Panel: Recent Labs  Lab 10/24/17 1408  NA 139  K 4.1  CL 108  CO2 21*  GLUCOSE 127*  BUN 9  CREATININE 1.04*  CALCIUM 9.3   GFR: CrCl cannot be calculated (Unknown ideal weight.). Liver Function Tests: Recent Labs  Lab 10/24/17 1408  AST 29  ALT 26  ALKPHOS 127*  BILITOT 0.2*  PROT 8.4*  ALBUMIN 3.6   Recent Labs  Lab 10/24/17 1408  LIPASE 30   No results for input(s):  AMMONIA in the last 168 hours. Coagulation Profile: No results for input(s): INR, PROTIME in the last 168 hours. Cardiac Enzymes: No results for input(s): CKTOTAL, CKMB, CKMBINDEX, TROPONINI in the last 168 hours. BNP (last 3 results) No results for input(s): PROBNP in the last 8760 hours. HbA1C: No results for input(s): HGBA1C in the last 72 hours. CBG: No results for input(s): GLUCAP in the last 168 hours. Lipid Profile: No results for input(s): CHOL, HDL, LDLCALC, TRIG, CHOLHDL, LDLDIRECT in the last 72 hours. Thyroid Function Tests: No results for input(s): TSH, T4TOTAL, FREET4, T3FREE, THYROIDAB in the last 72 hours. Anemia Panel: No results for input(s): VITAMINB12, FOLATE, FERRITIN, TIBC, IRON, RETICCTPCT in the last 72 hours. Urine analysis:    Component Value Date/Time   COLORURINE YELLOW 10/24/2017 2121   APPEARANCEUR CLEAR 10/24/2017 2121   LABSPEC >1.046 (H) 10/24/2017 2121   PHURINE 6.0 10/24/2017 2121   GLUCOSEU NEGATIVE 10/24/2017 2121   HGBUR NEGATIVE 10/24/2017 2121   [place] NEGATIVE 10/24/2017 2121   KETONESUR 20 (A) 10/24/2017  2121   PROTEINUR NEGATIVE 10/24/2017 2121   UROBILINOGEN 0.2 09/22/2014 0730   NITRITE NEGATIVE 10/24/2017 2121   LEUKOCYTESUR NEGATIVE 10/24/2017 2121   Sepsis Labs: @LABRCNTIP (procalcitonin:4,lacticidven:4) )No results found for this or any previous visit (from the past 240 hour(s)).   Radiological Exams on Admission: Ct Abdomen Pelvis W Contrast  Result Date: 10/24/2017 CLINICAL DATA:  Abdomen pain with nausea and vomiting gallbladder removal 10/01/2017 EXAM: CT ABDOMEN AND PELVIS WITH CONTRAST TECHNIQUE: Multidetector CT imaging of the abdomen and pelvis was performed using the standard protocol following bolus administration of intravenous contrast. CONTRAST:  125mL ISOVUE-300 IOPAMIDOL (ISOVUE-300) INJECTION 61% COMPARISON:  CT 09/27/2017, 08/29 /2019, 08/21/2016 FINDINGS: Lower chest: Lung bases demonstrate no acute consolidation or effusion. Heart size upper limits normal. Small hiatal hernia. Hepatobiliary: Status post cholecystectomy. No biliary dilatation. Posterior right lobe hemangioma. Pancreas: Unremarkable. No pancreatic ductal dilatation or surrounding inflammatory changes. Spleen: Normal in size without focal abnormality. Adrenals/Urinary Tract: 2 cm left adrenal gland myelolipoma. Right adrenal gland is normal. Kidneys are normal, without renal calculi, focal lesion, or hydronephrosis. Bladder is unremarkable. Stomach/Bowel: Stomach is within normal limits. Appendix appears normal. No evidence of bowel wall thickening, distention, or inflammatory changes. Vascular/Lymphatic: No significant vascular findings are present. No enlarged abdominal or pelvic lymph nodes. Reproductive: Uterus and bilateral adnexa are unremarkable. Other: No abdominal wall hernia or abnormality. No abdominopelvic ascites. Musculoskeletal: No acute or significant osseous findings. IMPRESSION: 1. No CT evidence for acute intra-abdominal or pelvic abnormality. 2. Vague hypodensity in the posterior right hepatic  lobe, likely corresponding to previously noted hemangioma 3. Interval cholecystectomy.  No biliary dilatation 4. 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 Electronically Signed   By: Donavan Foil M.D.   On: 10/24/2017 19:41     Assessment/Plan Principal Problem:   Nausea & vomiting Active Problems:   Marijuana abuse    1. Intractable nausea vomiting with epigastric pain with recent cholecystectomy -appreciate general surgery consult.  Plan is to get HIDA scan to rule out biliary leak.  Will keep patient n.p.o. until then.  IV fluids pain relief medications.  Antiemetics. 2. Severe leukocytosis -no definite source of infection.  If persistent will get blood cultures.  Temperatures around 99 F. 3. History of marijuana abuse has not had any melena for last 3 weeks per patient. 4. Elevated blood pressure -mostly from the blood pressure trends we will keep patient IV hydralazine for now.  Likely from pain.  DVT prophylaxis: SCDs. Code Status: Full code. Family Communication: Discussed with patient. Disposition Plan: Home. Consults called: General surgery. Admission status: Observation.   Rise Patience MD [facility] Pager [phone].  If 7PM-7AM, please contact night-coverage [email or web address] Password [email or web address]  10/25/2017, 12:36 AM

## 2017-10-25 NOTE — Progress Notes (Signed)
Patient has a midline catheter ordered;  Pt refused to have the midline placed, unless one more attempt at a piv failed;   Pt does have a good-looking basilic vein in the right upper arm, but pt did not want the iv placed there.  Able to start a 22ga in the right posterior forearm.    Raynelle Fanning RN IV Team

## 2017-10-25 NOTE — ED Notes (Signed)
Called lab rapid urine drug screen added

## 2017-10-25 NOTE — ED Notes (Signed)
ED TO INPATIENT HANDOFF REPORT  Name/Age/Gender Haley Freeman 42 y.o. female  Code Status Code Status History    Date Active Date Inactive Code Status Order ID Comments User Context   09/26/2017 1548 10/02/2017 1248 Full Code [id number]  Caren Griffins, MD Inpatient      Home/SNF/Other Home  Chief Complaint emesis  Level of Care/Admitting Diagnosis ED Disposition    ED Disposition Condition [place]: [facility] [[id number]]  Level of Care: Med-Surg [16]  Diagnosis: Nausea & vomiting [388828]  Admitting Physician: Rise Patience [phone]  Attending Physician: Rise Patience [email or web address]  PT Class (Do Not Modify): Observation [104]  PT Acc Code (Do Not Modify): Observation [10022]       Medical History Past Medical History:  Diagnosis Date  . Alcohol abuse   . Anxiety   . Depression   . Headache(784.0)   . Neuromuscular disorder (HCC)     Allergies Allergies  Allergen Reactions  . Strawberry (Diagnostic) Shortness Of Breath  . Penicillins Swelling    Swells throat. Has patient had a PCN reaction causing immediate rash, facial/tongue/throat swelling, SOB or lightheadedness with hypotension: Yes Has patient had a PCN reaction causing severe rash involving mucus membranes or skin necrosis: No Has patient had a PCN reaction that required hospitalization: Yes Has patient had a PCN reaction occurring within the last 10 years: No If all of the above answers are "NO", then may proceed with Cephalosporin use.     IV Location/Drains/Wounds Patient Lines/Drains/Airways Status   Active Line/Drains/Airways    Name:   Placement date:   Placement time:   Site:   Days:   Peripheral IV 10/24/17 Left Antecubital   10/24/17    1659    Antecubital   1   Incision (Closed) 10/01/17 Abdomen   10/01/17    1109     24   Incision - 4 Ports Abdomen 1: Anterior;Superior Umbilicus Right;Lateral Right;Lateral;Lower   10/01/17    1111      24          Labs/Imaging Results for orders placed or performed during the hospital encounter of 10/24/17 (from the past 48 hour(s))  Lipase, blood     Status: None   Collection Time: 10/24/17  2:08 PM  Result Value Ref Range   Lipase 30 11 - 51 U/L    Comment: Performed at [facility], [place] [address] [place], [place] [address]  Comprehensive metabolic panel     Status: Abnormal   Collection Time: 10/24/17  2:08 PM  Result Value Ref Range   Sodium 139 135 - 145 mmol/L   Potassium 4.1 3.5 - 5.1 mmol/L   Chloride 108 98 - 111 mmol/L   CO2 21 (L) 22 - 32 mmol/L   Glucose, Bld 127 (H) 70 - 99 mg/dL   BUN 9 6 - 20 mg/dL   Creatinine, Ser 1.04 (H) 0.44 - 1.00 mg/dL   Calcium 9.3 8.9 - 10.3 mg/dL   Total Protein 8.4 (H) 6.5 - 8.1 g/dL   Albumin 3.6 3.5 - 5.0 g/dL   AST 29 15 - 41 U/L   ALT 26 0 - 44 U/L   Alkaline Phosphatase 127 (H) 38 - 126 U/L   Total Bilirubin 0.2 (L) 0.3 - 1.2 mg/dL   GFR calc non Af Amer >60 >60 mL/min   GFR calc Af Amer >60 >60 mL/min    Comment: (NOTE) The eGFR has  been calculated using the CKD EPI equation. This calculation has not been validated in all clinical situations. eGFR's persistently <60 mL/min signify possible Chronic Kidney Disease.    Anion gap 10 5 - 15    Comment: Performed at [facility], [place] [address] [place], [place] [address]  CBC     Status: Abnormal   Collection Time: 10/24/17  2:08 PM  Result Value Ref Range   WBC 23.5 (H) 4.0 - 10.5 K/uL   RBC 5.18 (H) 3.87 - 5.11 MIL/uL   Hemoglobin 11.0 (L) 12.0 - 15.0 g/dL   HCT 37.3 36.0 - 46.0 %   MCV 72.0 (L) 80.0 - 100.0 fL   MCH 21.2 (L) 26.0 - 34.0 pg   MCHC 29.5 (L) 30.0 - 36.0 g/dL   RDW 18.4 (H) 11.5 - 15.5 %   Platelets 483 (H) 150 - 400 K/uL   nRBC 0.0 0.0 - 0.2 %    Comment: Performed at [facility], [place] [address], [place], [place] [address]  I-Stat beta hCG blood, ED     Status: Abnormal   Collection Time:  10/24/17  2:15 PM  Result Value Ref Range   I-stat hCG, quantitative 6.3 (H) <5 mIU/mL   Comment 3            Comment:   GEST. AGE      CONC.  (mIU/mL)   <=1 WEEK        5 - 50     2 WEEKS       50 - 500     3 WEEKS       100 - 10,000     4 WEEKS     1,000 - 30,000        FEMALE AND NON-PREGNANT FEMALE:     LESS THAN 5 mIU/mL   hCG, serum, qualitative     Status: None   Collection Time: 10/24/17  6:11 PM  Result Value Ref Range   Preg, Serum NEGATIVE NEGATIVE    Comment:        THE SENSITIVITY OF THIS METHODOLOGY IS >10 mIU/mL. Performed at [facility], [place] [address], [place], [place] [address]   Urinalysis, Routine w reflex microscopic     Status: Abnormal   Collection Time: 10/24/17  9:21 PM  Result Value Ref Range   Color, Urine YELLOW YELLOW   APPearance CLEAR CLEAR   Specific Gravity, Urine >1.046 (H) 1.005 - 1.030   pH 6.0 5.0 - 8.0   Glucose, UA NEGATIVE NEGATIVE mg/dL   Hgb urine dipstick NEGATIVE NEGATIVE   Bilirubin Urine NEGATIVE NEGATIVE   Ketones, ur 20 (A) NEGATIVE mg/dL   Protein, ur NEGATIVE NEGATIVE mg/dL   Nitrite NEGATIVE NEGATIVE   Leukocytes, UA NEGATIVE NEGATIVE    Comment: Performed at [place] [address] [place], [place] [address]  I-Stat CG4 Lactic Acid, ED     Status: None   Collection Time: 10/24/17  9:28 PM  Result Value Ref Range   Lactic Acid, Venous 1.20 0.5 - 1.9 mmol/L   Ct Abdomen Pelvis W Contrast  Result Date: 10/24/2017 CLINICAL DATA:  Abdomen pain with nausea and vomiting gallbladder removal 10/01/2017 EXAM: CT ABDOMEN AND PELVIS WITH CONTRAST TECHNIQUE: Multidetector CT imaging of the abdomen and pelvis was performed using the standard protocol following bolus administration of intravenous contrast. CONTRAST:  117m ISOVUE-300 IOPAMIDOL (ISOVUE-300) INJECTION 61% COMPARISON:  CT 09/27/2017, 08/29 /2019, 08/21/2016 FINDINGS: Lower chest:  Lung bases demonstrate no acute consolidation or  effusion. Heart size upper limits normal. Small hiatal hernia. Hepatobiliary: Status post cholecystectomy. No biliary dilatation. Posterior right lobe hemangioma. Pancreas: Unremarkable. No pancreatic ductal dilatation or surrounding inflammatory changes. Spleen: Normal in size without focal abnormality. Adrenals/Urinary Tract: 2 cm left adrenal gland myelolipoma. Right adrenal gland is normal. Kidneys are normal, without renal calculi, focal lesion, or hydronephrosis. Bladder is unremarkable. Stomach/Bowel: Stomach is within normal limits. Appendix appears normal. No evidence of bowel wall thickening, distention, or inflammatory changes. Vascular/Lymphatic: No significant vascular findings are present. No enlarged abdominal or pelvic lymph nodes. Reproductive: Uterus and bilateral adnexa are unremarkable. Other: No abdominal wall hernia or abnormality. No abdominopelvic ascites. Musculoskeletal: No acute or significant osseous findings. IMPRESSION: 1. No CT evidence for acute intra-abdominal or pelvic abnormality. 2. Vague hypodensity in the posterior right hepatic lobe, likely corresponding to previously noted hemangioma 3. Interval cholecystectomy.  No biliary dilatation 4. 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 Electronically Signed   By: Donavan Foil M.D.   On: 10/24/2017 19:41   None  Pending Labs Unresulted Labs (From admission, onward)    Start     Ordered   10/24/17 1555  hCG, quantitative, pregnancy  Add-on,   STAT     10/24/17 1554   10/24/17 1553  Troponin I  Add-on,   STAT     10/24/17 1552          Vitals/Pain Today's Vitals   10/24/17 2157 10/24/17 2221 10/24/17 2304 10/25/17 0034  BP:  (!) 165/121  (!) 160/99  Pulse:  98  99  Resp:  (!) 22  20  Temp:  99.1 F (37.3 C)  98.9 F (37.2 C)  TempSrc:  Oral  Oral  SpO2:  97%  98%  PainSc: '9  8  7  9     ' Isolation Precautions No active isolations  Medications Medications  iopamidol (ISOVUE-300) 61 % injection (has  no administration in time range)  ondansetron (ZOFRAN-ODT) disintegrating tablet 4 mg (4 mg Oral Given 10/24/17 1324)  sodium chloride 0.9 % bolus 1,000 mL (0 mLs Intravenous Stopped 10/24/17 1659)  morphine 2 MG/ML injection 2 mg (2 mg Intravenous Given 10/24/17 1557)  ondansetron (ZOFRAN) injection 4 mg (4 mg Intravenous Given 10/24/17 1558)  metoCLOPramide (REGLAN) injection 10 mg (10 mg Intravenous Given 10/24/17 1829)  diphenhydrAMINE (BENADRYL) injection 25 mg (25 mg Intravenous Given 10/24/17 1829)  iopamidol (ISOVUE-300) 61 % injection 100 mL (100 mLs Intravenous Contrast Given 10/24/17 1914)  morphine 2 MG/ML injection 2 mg (2 mg Intravenous Given 10/24/17 2024)  diphenhydrAMINE (BENADRYL) injection 25 mg (25 mg Intravenous Given 10/24/17 2221)  morphine 2 MG/ML injection 2 mg (2 mg Intravenous Given 10/24/17 2221)  metoCLOPramide (REGLAN) injection 10 mg (10 mg Intravenous Given 10/24/17 2221)    Mobility walks

## 2017-10-25 NOTE — Progress Notes (Signed)
Patient seen and examined at bedside, patient admitted after midnight, please see earlier detailed admission note by Rise Patience, MD. Briefly, patient presented with epigastric pain concerning for possible bile leak. HIDA unremarkable for leak. Will give GI cocktail and consult [facility].   Cordelia Poche, MD [facility] 10/25/2017, 10:30 AM

## 2017-10-26 LAB — CBC
HCT: 36.3 % (ref 36.0–46.0)
Hemoglobin: 10.4 g/dL — ABNORMAL LOW (ref 12.0–15.0)
MCH: 20.9 pg — AB (ref 26.0–34.0)
MCHC: 28.7 g/dL — AB (ref 30.0–36.0)
MCV: 73 fL — AB (ref 80.0–100.0)
PLATELETS: 455 10*3/uL — AB (ref 150–400)
RBC: 4.97 MIL/uL (ref 3.87–5.11)
RDW: 18.5 % — AB (ref 11.5–15.5)
WBC: 18.8 10*3/uL — ABNORMAL HIGH (ref 4.0–10.5)
nRBC: 0 % (ref 0.0–0.2)

## 2017-10-26 MED ORDER — PANTOPRAZOLE SODIUM 40 MG PO TBEC
40.0000 mg | DELAYED_RELEASE_TABLET | Freq: Every day | ORAL | Status: DC
  Administered 2017-10-26 – 2017-11-01 (×7): 40 mg via ORAL
  Filled 2017-10-26 (×7): qty 1

## 2017-10-26 MED ORDER — LORAZEPAM 2 MG/ML IJ SOLN
0.5000 mg | Freq: Once | INTRAMUSCULAR | Status: AC
  Administered 2017-10-26: 0.5 mg via INTRAVENOUS
  Filled 2017-10-26: qty 1

## 2017-10-26 MED ORDER — GI COCKTAIL ~~LOC~~
30.0000 mL | Freq: Three times a day (TID) | ORAL | Status: DC | PRN
  Administered 2017-10-26 – 2017-10-27 (×3): 30 mL via ORAL
  Filled 2017-10-26 (×5): qty 30

## 2017-10-26 MED ORDER — CALCIUM CARBONATE ANTACID 500 MG PO CHEW
1.0000 | CHEWABLE_TABLET | Freq: Three times a day (TID) | ORAL | Status: DC | PRN
  Administered 2017-10-27: 200 mg via ORAL
  Filled 2017-10-26: qty 1

## 2017-10-26 MED ORDER — PROMETHAZINE HCL 25 MG/ML IJ SOLN
12.5000 mg | Freq: Four times a day (QID) | INTRAMUSCULAR | Status: DC | PRN
  Administered 2017-10-26 – 2017-10-30 (×8): 12.5 mg via INTRAVENOUS
  Filled 2017-10-26 (×8): qty 1

## 2017-10-26 MED ORDER — SIMETHICONE 80 MG PO CHEW
80.0000 mg | CHEWABLE_TABLET | Freq: Four times a day (QID) | ORAL | Status: DC | PRN
  Administered 2017-10-26 – 2017-10-30 (×3): 80 mg via ORAL
  Filled 2017-10-26 (×3): qty 1

## 2017-10-26 NOTE — Progress Notes (Addendum)
PROGRESS NOTE    Haley Freeman  HCW:[id number] DOB: 01/17/1975 DOA: 10/24/2017 PCP: Patient, No Pcp Per   Brief Narrative: Haley Freeman is a 42 y.o. female with a history of alcohol abuse, marijuana use, depression, s/p cholecystectomy. She presented secondary to abdominal pain.   Assessment & Plan:   Principal Problem:   Nausea & vomiting Active Problems:   Marijuana abuse   Nausea/vomiting Abdominal pain Unknown etiology. CT scan unremarkable for etiology. HIDA scan negative for bile leak. No evidence of acute pancreatitis. Lipase normal. Symptoms persisting. -GI recommendations -Add protonix, GI cocktail -Continue Tums -Continue Zofran  Leukocytosis Unknown etiology. No evidence of infection. No diarrhea. Blood cultures pending. Trending down. -Repeat CBC -Blood cultures pending  Marijuana use Infrequent per patient.  Recent cholecystectomy General surgery signed off.   DVT prophylaxis: Lovenox Code Status:   Code Status: Full Code Family Communication: None at bedside Disposition Plan: Discharge pending medical improvement   Consultants:   General surgery  [facility]  Procedures:   None  Antimicrobials:  None    Subjective: Multiple (10+) episodes of emesis yesterday per patient. Nausea improved today with ativan. Still with epigastric pain. Concerned that there is no diagnosis.  Objective: Vitals:   10/25/17 0632 10/25/17 1457 10/25/17 2000 10/26/17 0408  BP: (!) 151/91 (!) 127/91 134/81 133/75  Pulse: 100 88 77 76  Resp: 16 18 16 14   Temp: 99 F (37.2 C) 98.7 F (37.1 C) 99.1 F (37.3 C) 99.1 F (37.3 C)  TempSrc: Oral Oral Oral Oral  SpO2: 100% 100% 99% 100%  Weight:      Height:        Intake/Output Summary (Last 24 hours) at 10/26/2017 1107 Last data filed at 10/26/2017 0900 Gross per 24 hour  Intake 716.66 ml  Output -  Net 716.66 ml   Filed Weights   10/25/17 0056  Weight: (!) 136.5 kg     Examination:  General exam: Appears calm and comfortable Respiratory system: Clear to auscultation. Respiratory effort normal. Cardiovascular system: S1 & S2 heard, RRR. No murmurs, rubs, gallops or clicks. Gastrointestinal system: Abdomen is nondistended, soft and with epigastric tenderness. No organomegaly or masses felt. Normal bowel sounds heard. Central nervous system: Alert and oriented. No focal neurological deficits. Extremities: No edema. No calf tenderness Skin: No cyanosis. No rashes Psychiatry: Judgement and insight appear normal. Mood & affect appropriate.     Data Reviewed: I have personally reviewed following labs and imaging studies  CBC: Recent Labs  Lab 10/24/17 1408 10/25/17 0115 10/26/17 0517  WBC 23.5* 27.5* 18.8*  NEUTROABS  --  23.8*  --   HGB 11.0* 10.9* 10.4*  HCT 37.3 37.3 36.3  MCV 72.0* 71.7* 73.0*  PLT 483* 550* 176*   Basic Metabolic Panel: Recent Labs  Lab 10/24/17 1408 10/25/17 0115  NA 139 139  K 4.1 3.6  CL 108 106  CO2 21* 22  GLUCOSE 127* 135*  BUN 9 10  CREATININE 1.04* 1.02*  CALCIUM 9.3 9.6   GFR: Estimated Creatinine Clearance: 105.4 mL/min (A) (by C-G formula based on SCr of 1.02 mg/dL (H)). Liver Function Tests: Recent Labs  Lab 10/24/17 1408 10/25/17 0115  AST 29 36  ALT 26 32  ALKPHOS 127* 124  BILITOT 0.2* 0.1*  PROT 8.4* 9.3*  ALBUMIN 3.6 4.0   Recent Labs  Lab 10/24/17 1408  LIPASE 30   No results for input(s): AMMONIA in the last 168 hours. Coagulation Profile: No results for input(s):  INR, PROTIME in the last 168 hours. Cardiac Enzymes: Recent Labs  Lab 10/25/17 0115  TROPONINI <0.03   BNP (last 3 results) No results for input(s): PROBNP in the last 8760 hours. HbA1C: No results for input(s): HGBA1C in the last 72 hours. CBG: No results for input(s): GLUCAP in the last 168 hours. Lipid Profile: No results for input(s): CHOL, HDL, LDLCALC, TRIG, CHOLHDL, LDLDIRECT in the last 72  hours. Thyroid Function Tests: No results for input(s): TSH, T4TOTAL, FREET4, T3FREE, THYROIDAB in the last 72 hours. Anemia Panel: No results for input(s): VITAMINB12, FOLATE, FERRITIN, TIBC, IRON, RETICCTPCT in the last 72 hours. Sepsis Labs: Recent Labs  Lab 10/24/17 2128  LATICACIDVEN 1.20    No results found for this or any previous visit (from the past 240 hour(s)).       Radiology Studies: Nm Hepatobiliary Liver Func  Result Date: 10/25/2017 CLINICAL DATA:  42 y/o F; cholecystectomy 10/01/2017. Epigastric pain with nausea and vomiting starting 2 days ago. Rule out bile leak. EXAM: NUCLEAR MEDICINE HEPATOBILIARY IMAGING TECHNIQUE: Sequential images of the abdomen were obtained out to 60 minutes following intravenous administration of radiopharmaceutical. RADIOPHARMACEUTICALS:  5.1 mCi Tc-33m  Choletec IV COMPARISON:  10/24/2017 CT abdomen and pelvis. FINDINGS: Prompt uptake and biliary excretion of activity by the liver is seen. No gallbladder activity is visualized. Biliary activity passes into small bowel, consistent with patent common bile duct. Reflux into the stomach is noted. No bile leak identified. IMPRESSION: No bile leak identified.  Patent common bile duct. Electronically Signed   By: Kristine Garbe M.D.   On: 10/25/2017 06:24   Ct Abdomen Pelvis W Contrast  Result Date: 10/24/2017 CLINICAL DATA:  Abdomen pain with nausea and vomiting gallbladder removal 10/01/2017 EXAM: CT ABDOMEN AND PELVIS WITH CONTRAST TECHNIQUE: Multidetector CT imaging of the abdomen and pelvis was performed using the standard protocol following bolus administration of intravenous contrast. CONTRAST:  129mL ISOVUE-300 IOPAMIDOL (ISOVUE-300) INJECTION 61% COMPARISON:  CT 09/27/2017, 08/29 /2019, 08/21/2016 FINDINGS: Lower chest: Lung bases demonstrate no acute consolidation or effusion. Heart size upper limits normal. Small hiatal hernia. Hepatobiliary: Status post cholecystectomy. No  biliary dilatation. Posterior right lobe hemangioma. Pancreas: Unremarkable. No pancreatic ductal dilatation or surrounding inflammatory changes. Spleen: Normal in size without focal abnormality. Adrenals/Urinary Tract: 2 cm left adrenal gland myelolipoma. Right adrenal gland is normal. Kidneys are normal, without renal calculi, focal lesion, or hydronephrosis. Bladder is unremarkable. Stomach/Bowel: Stomach is within normal limits. Appendix appears normal. No evidence of bowel wall thickening, distention, or inflammatory changes. Vascular/Lymphatic: No significant vascular findings are present. No enlarged abdominal or pelvic lymph nodes. Reproductive: Uterus and bilateral adnexa are unremarkable. Other: No abdominal wall hernia or abnormality. No abdominopelvic ascites. Musculoskeletal: No acute or significant osseous findings. IMPRESSION: 1. No CT evidence for acute intra-abdominal or pelvic abnormality. 2. Vague hypodensity in the posterior right hepatic lobe, likely corresponding to previously noted hemangioma 3. Interval cholecystectomy.  No biliary dilatation 4. 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 Electronically Signed   By: Donavan Foil M.D.   On: 10/24/2017 19:41        Scheduled Meds: . clidinium-chlordiazePOXIDE  1 capsule Oral TID AC  . mirtazapine  45 mg Oral QHS  . pantoprazole  40 mg Oral Daily  . traZODone  300 mg Oral QHS   Continuous Infusions:   LOS: 1 day     Cordelia Poche, MD [facility] 10/26/2017, 11:07 AM  If 7PM-7AM, please contact night-coverage [email or web address]

## 2017-10-26 NOTE — Progress Notes (Addendum)
Subjective: Patient seen at bedside. Reports multiple episodes of bilious vomiting,at least 10 along with worsening epigastric abdominal pain.  Objective: Vital signs in last 24 hours: Temp:  [98.7 F (37.1 C)-99.1 F (37.3 C)] 99.1 F (37.3 C) (10/20 0408) Pulse Rate:  [76-88] 76 (10/20 0408) Resp:  [14-18] 14 (10/20 0408) BP: (127-134)/(75-91) 133/75 (10/20 0408) SpO2:  [99 %-100 %] 100 % (10/20 0408) Weight change:  Last BM Date: 10/23/17  UV:OZDGUY bag at bedside with small amount of bilious fluid in it GENERAL:obese, in mild distress from abdominal discomfort and emesis ABDOMEN:non-distended EXTREMITIES:no deformity  Lab Results: Results for orders placed or performed during the hospital encounter of 10/24/17 (from the past 48 hour(s))  Lipase, blood     Status: None   Collection Time: 10/24/17  2:08 PM  Result Value Ref Range   Lipase 30 11 - 51 U/L    Comment: Performed at [facility], [place] [address] [place], [place] [address]  Comprehensive metabolic panel     Status: Abnormal   Collection Time: 10/24/17  2:08 PM  Result Value Ref Range   Sodium 139 135 - 145 mmol/L   Potassium 4.1 3.5 - 5.1 mmol/L   Chloride 108 98 - 111 mmol/L   CO2 21 (L) 22 - 32 mmol/L   Glucose, Bld 127 (H) 70 - 99 mg/dL   BUN 9 6 - 20 mg/dL   Creatinine, Ser 1.04 (H) 0.44 - 1.00 mg/dL   Calcium 9.3 8.9 - 10.3 mg/dL   Total Protein 8.4 (H) 6.5 - 8.1 g/dL   Albumin 3.6 3.5 - 5.0 g/dL   AST 29 15 - 41 U/L   ALT 26 0 - 44 U/L   Alkaline Phosphatase 127 (H) 38 - 126 U/L   Total Bilirubin 0.2 (L) 0.3 - 1.2 mg/dL   GFR calc non Af Amer >60 >60 mL/min   GFR calc Af Amer >60 >60 mL/min    Comment: (NOTE) The eGFR has been calculated using the CKD EPI equation. This calculation has not been validated in all clinical situations. eGFR's persistently <60 mL/min signify possible Chronic Kidney Disease.    Anion gap 10 5 - 15    Comment: Performed at [facility], [place] [address] [place], [place] [address]  CBC     Status: Abnormal   Collection Time: 10/24/17  2:08 PM  Result Value Ref Range   WBC 23.5 (H) 4.0 - 10.5 K/uL   RBC 5.18 (H) 3.87 - 5.11 MIL/uL   Hemoglobin 11.0 (L) 12.0 - 15.0 g/dL   HCT 37.3 36.0 - 46.0 %   MCV 72.0 (L) 80.0 - 100.0 fL   MCH 21.2 (L) 26.0 - 34.0 pg   MCHC 29.5 (L) 30.0 - 36.0 g/dL   RDW 18.4 (H) 11.5 - 15.5 %   Platelets 483 (H) 150 - 400 K/uL   nRBC 0.0 0.0 - 0.2 %    Comment: Performed at [facility], [place] [address] [place], [place] [address]  I-Stat beta hCG blood, ED     Status: Abnormal   Collection Time: 10/24/17  2:15 PM  Result Value Ref Range   I-stat hCG, quantitative 6.3 (H) <5 mIU/mL   Comment 3            Comment:   GEST. AGE      CONC.  (mIU/mL)   <=1 WEEK        5 - 50  2 WEEKS       50 - 500     3 WEEKS       100 - 10,000     4 WEEKS     1,000 - 30,000        FEMALE AND NON-PREGNANT FEMALE:     LESS THAN 5 mIU/mL   hCG, serum, qualitative     Status: None   Collection Time: 10/24/17  6:11 PM  Result Value Ref Range   Preg, Serum NEGATIVE NEGATIVE    Comment:        THE SENSITIVITY OF THIS METHODOLOGY IS >10 mIU/mL. Performed at [facility], [place] [address] [place], [place] [address]   Urinalysis, Routine w reflex microscopic     Status: Abnormal   Collection Time: 10/24/17  9:21 PM  Result Value Ref Range   Color, Urine YELLOW YELLOW   APPearance CLEAR CLEAR   Specific Gravity, Urine >1.046 (H) 1.005 - 1.030   pH 6.0 5.0 - 8.0   Glucose, UA NEGATIVE NEGATIVE mg/dL   Hgb urine dipstick NEGATIVE NEGATIVE   Bilirubin Urine NEGATIVE NEGATIVE   Ketones, ur 20 (A) NEGATIVE mg/dL   Protein, ur NEGATIVE NEGATIVE mg/dL   Nitrite NEGATIVE NEGATIVE   Leukocytes, UA NEGATIVE NEGATIVE    Comment: Performed at [place] [address] [place], [place] [address]  I-Stat CG4 Lactic Acid, ED     Status: None    Collection Time: 10/24/17  9:28 PM  Result Value Ref Range   Lactic Acid, Venous 1.20 0.5 - 1.9 mmol/L  Urine rapid drug screen (hosp performed)     Status: Abnormal   Collection Time: 10/25/17 12:36 AM  Result Value Ref Range   Opiates POSITIVE (A) NONE DETECTED   Cocaine NONE DETECTED NONE DETECTED   Benzodiazepines NONE DETECTED NONE DETECTED   Amphetamines NONE DETECTED NONE DETECTED   Tetrahydrocannabinol POSITIVE (A) NONE DETECTED   Barbiturates NONE DETECTED NONE DETECTED    Comment: (NOTE) DRUG SCREEN FOR MEDICAL PURPOSES ONLY.  IF CONFIRMATION IS NEEDED FOR ANY PURPOSE, NOTIFY LAB WITHIN 5 DAYS. LOWEST DETECTABLE LIMITS FOR URINE DRUG SCREEN Drug Class                     Cutoff (ng/mL) Amphetamine and metabolites    1000 Barbiturate and metabolites    200 Benzodiazepine                 397 Tricyclics and metabolites     300 Opiates and metabolites        300 Cocaine and metabolites        300 THC                            50 Performed at [facility], [place] [address] [place], [place] [address]   Troponin I     Status: None   Collection Time: 10/25/17  1:15 AM  Result Value Ref Range   Troponin I <0.03 <0.03 ng/mL    Comment: Performed at [facility], [place] [address] [place], [place] [address]  hCG, quantitative, pregnancy     Status: None   Collection Time: 10/25/17  1:15 AM  Result Value Ref Range   hCG, Beta Chain, Quant, S <1 <5 mIU/mL    Comment:          GEST. AGE      CONC.  (  mIU/mL)   <=1 WEEK        5 - 50     2 WEEKS       50 - 500     3 WEEKS       100 - 10,000     4 WEEKS     1,000 - 30,000     5 WEEKS     3,500 - 115,000   6-8 WEEKS     12,000 - 270,000    12 WEEKS     15,000 - 220,000        FEMALE AND NON-PREGNANT FEMALE:     LESS THAN 5 mIU/mL Performed at [facility], [place] [address] [place], [place] [address]   Basic metabolic panel     Status: Abnormal   Collection Time:  10/25/17  1:15 AM  Result Value Ref Range   Sodium 139 135 - 145 mmol/L   Potassium 3.6 3.5 - 5.1 mmol/L   Chloride 106 98 - 111 mmol/L   CO2 22 22 - 32 mmol/L   Glucose, Bld 135 (H) 70 - 99 mg/dL   BUN 10 6 - 20 mg/dL   Creatinine, Ser 1.02 (H) 0.44 - 1.00 mg/dL   Calcium 9.6 8.9 - 10.3 mg/dL   GFR calc non Af Amer >60 >60 mL/min   GFR calc Af Amer >60 >60 mL/min    Comment: (NOTE) The eGFR has been calculated using the CKD EPI equation. This calculation has not been validated in all clinical situations. eGFR's persistently <60 mL/min signify possible Chronic Kidney Disease.    Anion gap 11 5 - 15    Comment: Performed at [facility], [place] [address] [place], [place] [address]  Hepatic function panel     Status: Abnormal   Collection Time: 10/25/17  1:15 AM  Result Value Ref Range   Total Protein 9.3 (H) 6.5 - 8.1 g/dL   Albumin 4.0 3.5 - 5.0 g/dL   AST 36 15 - 41 U/L   ALT 32 0 - 44 U/L   Alkaline Phosphatase 124 38 - 126 U/L   Total Bilirubin 0.1 (L) 0.3 - 1.2 mg/dL   Bilirubin, Direct 0.1 0.0 - 0.2 mg/dL   Indirect Bilirubin 0.0 (L) 0.3 - 0.9 mg/dL    Comment: Performed at [facility], [place] [address] [place], [place] [address]  CBC WITH DIFFERENTIAL     Status: Abnormal   Collection Time: 10/25/17  1:15 AM  Result Value Ref Range   WBC 27.5 (H) 4.0 - 10.5 K/uL   RBC 5.20 (H) 3.87 - 5.11 MIL/uL   Hemoglobin 10.9 (L) 12.0 - 15.0 g/dL   HCT 37.3 36.0 - 46.0 %   MCV 71.7 (L) 80.0 - 100.0 fL   MCH 21.0 (L) 26.0 - 34.0 pg   MCHC 29.2 (L) 30.0 - 36.0 g/dL   RDW 18.0 (H) 11.5 - 15.5 %   Platelets 550 (H) 150 - 400 K/uL   nRBC 0.0 0.0 - 0.2 %   Neutrophils Relative % 86 %   Neutro Abs 23.8 (H) 1.7 - 7.7 K/uL   Lymphocytes Relative 11 %   Lymphs Abs 3.0 0.7 - 4.0 K/uL   Monocytes Relative 2 %   Monocytes Absolute 0.4 0.1 - 1.0 K/uL   Eosinophils Relative 0 %   Eosinophils Absolute 0.0 0.0 - 0.5 K/uL   Basophils Relative 0 %    Basophils Absolute 0.1 0.0 - 0.1 K/uL  Immature Granulocytes 1 %   Abs Immature Granulocytes 0.20 (H) 0.00 - 0.07 K/uL   Polychromasia PRESENT     Comment: Performed at [facility], [place] [address], [place], [place] [address]  CBC     Status: Abnormal   Collection Time: 10/26/17  5:17 AM  Result Value Ref Range   WBC 18.8 (H) 4.0 - 10.5 K/uL   RBC 4.97 3.87 - 5.11 MIL/uL   Hemoglobin 10.4 (L) 12.0 - 15.0 g/dL   HCT 36.3 36.0 - 46.0 %   MCV 73.0 (L) 80.0 - 100.0 fL   MCH 20.9 (L) 26.0 - 34.0 pg   MCHC 28.7 (L) 30.0 - 36.0 g/dL   RDW 18.5 (H) 11.5 - 15.5 %   Platelets 455 (H) 150 - 400 K/uL   nRBC 0.0 0.0 - 0.2 %    Comment: Performed at [facility], [place] [address], [place], [place] [address]    Studies/Results: Nm Hepatobiliary Liver Func  Result Date: 10/25/2017 CLINICAL DATA:  42 y/o F; cholecystectomy 10/01/2017. Epigastric pain with nausea and vomiting starting 2 days ago. Rule out bile leak. EXAM: NUCLEAR MEDICINE HEPATOBILIARY IMAGING TECHNIQUE: Sequential images of the abdomen were obtained out to 60 minutes following intravenous administration of radiopharmaceutical. RADIOPHARMACEUTICALS:  5.1 mCi Tc-8m Choletec IV COMPARISON:  10/24/2017 CT abdomen and pelvis. FINDINGS: Prompt uptake and biliary excretion of activity by the liver is seen. No gallbladder activity is visualized. Biliary activity passes into small bowel, consistent with patent common bile duct. Reflux into the stomach is noted. No bile leak identified. IMPRESSION: No bile leak identified.  Patent common bile duct. Electronically Signed   By: LKristine GarbeM.D.   On: 10/25/2017 06:24   Ct Abdomen Pelvis W Contrast  Result Date: 10/24/2017 CLINICAL DATA:  Abdomen pain with nausea and vomiting gallbladder removal 10/01/2017 EXAM: CT ABDOMEN AND PELVIS WITH CONTRAST TECHNIQUE: Multidetector CT imaging of the abdomen and pelvis was performed using the standard  protocol following bolus administration of intravenous contrast. CONTRAST:  1075mISOVUE-300 IOPAMIDOL (ISOVUE-300) INJECTION 61% COMPARISON:  CT 09/27/2017, 08/29 /2019, 08/21/2016 FINDINGS: Lower chest: Lung bases demonstrate no acute consolidation or effusion. Heart size upper limits normal. Small hiatal hernia. Hepatobiliary: Status post cholecystectomy. No biliary dilatation. Posterior right lobe hemangioma. Pancreas: Unremarkable. No pancreatic ductal dilatation or surrounding inflammatory changes. Spleen: Normal in size without focal abnormality. Adrenals/Urinary Tract: 2 cm left adrenal gland myelolipoma. Right adrenal gland is normal. Kidneys are normal, without renal calculi, focal lesion, or hydronephrosis. Bladder is unremarkable. Stomach/Bowel: Stomach is within normal limits. Appendix appears normal. No evidence of bowel wall thickening, distention, or inflammatory changes. Vascular/Lymphatic: No significant vascular findings are present. No enlarged abdominal or pelvic lymph nodes. Reproductive: Uterus and bilateral adnexa are unremarkable. Other: No abdominal wall hernia or abnormality. No abdominopelvic ascites. Musculoskeletal: No acute or significant osseous findings. IMPRESSION: 1. No CT evidence for acute intra-abdominal or pelvic abnormality. 2. Vague hypodensity in the posterior right hepatic lobe, likely corresponding to previously noted hemangioma 3. Interval cholecystectomy.  No biliary dilatation 4. 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 Electronically Signed   By: KiDonavan Foil.D.   On: 10/24/2017 19:41    Medications: I have reviewed the patient's current medications.  Assessment: 1.Hyperemesis-likely related to marijuana use and cyclical vomiting syndrome Patient was started on Librax yesterday for possible IBS but reports no improvement in symptoms Unremarkable EGD on 9/19   2.Leukocytosis?stress-induced, along with mild anemia, hemoglobin 10.4 and  thrombocytosis,  platelet 455  Plan: Patient on PPI once a day, Zofran as needed, GI cocktail ( Maalox lidocaine and Donnatal), Librax and Tums as needed. Discussed with the patient that hyperemesis could be related to use of marijuana. Stomach, appendix, bowel wall appeared unremarkable on CAT scan from 10/24/17 with contrast, normal-appearing pancreas. Patient had a CT enterography on 09/27/17 for similar presentation which was unremarkable. Will order a gastric emptying scan to rule out gastroparesis, which can be abnormal with recent use of narcotics.    Haley Freeman 10/26/2017, 12:35 PM   Pager [phone] If no answer or after 5 PM call [phone]

## 2017-10-26 NOTE — Final Consult Note (Signed)
Consultant Final Sign-Off Note    Assessment/Final recommendations  Haley Freeman is a 42 y.o. female followed by me for history of recent cholecystetomy. She presented with pain and leukocytosis. Imaging of CT scan and hida scan were negative.   Wound care (if applicable):    Diet at discharge: per primary team   Activity at discharge: per primary team   Follow-up appointment:     Pending results:  Unresulted Labs (From admission, onward)    Start     Ordered   10/26/17 0500  CBC  Tomorrow morning,   R     10/25/17 1033   10/25/17 0814  Pathologist smear review  Once,   R     10/25/17 0813   10/25/17 0649  Culture, blood (routine x 2)  BLOOD CULTURE X 2,   R     10/25/17 0648           Medication recommendations:   Other recommendations:    Thank you for allowing Haley Freeman to participate in the care of your patient!  Please consult Haley Freeman again if you have further needs for your patient.  Arta Bruce Kinsinger 10/26/2017 4:53 AM    Subjective     Objective  Vital signs in last 24 hours: Temp:  [98.7 F (37.1 C)-99.1 F (37.3 C)] 99.1 F (37.3 C) (10/19 2000) Pulse Rate:  [77-100] 77 (10/19 2000) Resp:  [16-18] 16 (10/19 2000) BP: (127-151)/(81-91) 134/81 (10/19 2000) SpO2:  [99 %-100 %] 99 % (10/19 2000)  General: NAD Abd: soft, tender in epigastrium, incisions c/d/i   Pertinent labs and Studies: Recent Labs    10/24/17 1408 10/25/17 0115  WBC 23.5* 27.5*  HGB 11.0* 10.9*  HCT 37.3 37.3   BMET Recent Labs    10/24/17 1408 10/25/17 0115  NA 139 139  K 4.1 3.6  CL 108 106  CO2 21* 22  GLUCOSE 127* 135*  BUN 9 10  CREATININE 1.04* 1.02*  CALCIUM 9.3 9.6   No results for input(s): LABURIN in the last 72 hours. Results for orders placed or performed during the hospital encounter of 09/26/17  Surgical pcr screen     Status: Abnormal   Collection Time: 09/30/17 10:30 PM  Result Value Ref Range Status   MRSA, PCR NEGATIVE NEGATIVE Final    Staphylococcus aureus POSITIVE (A) NEGATIVE Final    Comment: (NOTE) The Xpert SA Assay (FDA approved for NASAL specimens in patients 30 years of age and older), is one component of a comprehensive surveillance program. It is not intended to diagnose infection nor to guide or monitor treatment. Performed at [facility], [place] [address], [place], [place] [address]     Imaging: Nm Hepatobiliary Liver Func  Result Date: 10/25/2017 CLINICAL DATA:  42 y/o F; cholecystectomy 10/01/2017. Epigastric pain with nausea and vomiting starting 2 days ago. Rule out bile leak. EXAM: NUCLEAR MEDICINE HEPATOBILIARY IMAGING TECHNIQUE: Sequential images of the abdomen were obtained out to 60 minutes following intravenous administration of radiopharmaceutical. RADIOPHARMACEUTICALS:  5.1 mCi Tc-57m  Choletec IV COMPARISON:  10/24/2017 CT abdomen and pelvis. FINDINGS: Prompt uptake and biliary excretion of activity by the liver is seen. No gallbladder activity is visualized. Biliary activity passes into small bowel, consistent with patent common bile duct. Reflux into the stomach is noted. No bile leak identified. IMPRESSION: No bile leak identified.  Patent common bile duct. Electronically Signed   By: Kristine Garbe M.D.   On: 10/25/2017 06:24

## 2017-10-27 DIAGNOSIS — R000 Tachycardia, unspecified: Secondary | ICD-10-CM

## 2017-10-27 LAB — CBC
HCT: 37.8 % (ref 36.0–46.0)
HEMOGLOBIN: 11 g/dL — AB (ref 12.0–15.0)
MCH: 20.8 pg — ABNORMAL LOW (ref 26.0–34.0)
MCHC: 29.1 g/dL — AB (ref 30.0–36.0)
MCV: 71.3 fL — ABNORMAL LOW (ref 80.0–100.0)
Platelets: 504 10*3/uL — ABNORMAL HIGH (ref 150–400)
RBC: 5.3 MIL/uL — AB (ref 3.87–5.11)
RDW: 18.3 % — ABNORMAL HIGH (ref 11.5–15.5)
WBC: 17.3 10*3/uL — AB (ref 4.0–10.5)
nRBC: 0 % (ref 0.0–0.2)

## 2017-10-27 MED ORDER — SODIUM CHLORIDE 0.9 % IV BOLUS
500.0000 mL | Freq: Once | INTRAVENOUS | Status: AC
  Administered 2017-10-27: 500 mL via INTRAVENOUS

## 2017-10-27 MED ORDER — KETOROLAC TROMETHAMINE 15 MG/ML IJ SOLN: 15.0000 mg | Freq: Three times a day (TID) | INTRAMUSCULAR | Status: DC | PRN

## 2017-10-27 MED ORDER — SODIUM CHLORIDE 0.9 % IV SOLN
INTRAVENOUS | Status: DC
  Administered 2017-10-27: 1000 mL via INTRAVENOUS
  Administered 2017-10-29: 14:00:00 via INTRAVENOUS

## 2017-10-27 MED ORDER — KETOROLAC TROMETHAMINE 30 MG/ML IJ SOLN
30.0000 mg | Freq: Once | INTRAMUSCULAR | Status: AC
  Administered 2017-10-27: 30 mg via INTRAVENOUS
  Filled 2017-10-27: qty 1

## 2017-10-27 MED ORDER — HYDROCODONE-ACETAMINOPHEN 5-325 MG PO TABS: 1.0000 | ORAL_TABLET | Freq: Four times a day (QID) | ORAL | Status: DC | PRN

## 2017-10-27 MED ORDER — DIPHENHYDRAMINE HCL 50 MG/ML IJ SOLN
12.5000 mg | Freq: Once | INTRAMUSCULAR | Status: AC
  Administered 2017-10-27: 12.5 mg via INTRAVENOUS
  Filled 2017-10-27: qty 1

## 2017-10-27 NOTE — Progress Notes (Signed)
Pt has her menses cycle.

## 2017-10-27 NOTE — Progress Notes (Signed)
Patient awaiting gastric emptying study, though results will likely not dramatically alter our current management approach.  Continue current care with IVF and antiemetics.  Avoid marijuana, minimize narcotics as possible.  Will revisit tomorrow.

## 2017-10-27 NOTE — Progress Notes (Signed)
PROGRESS NOTE    Haley Freeman  YTK:[id number] DOB: 10-Jul-1975 DOA: 10/24/2017 PCP: Patient, No Pcp Per   Brief Narrative: Haley Freeman is a 42 y.o. female with a history of alcohol abuse, marijuana use, depression, s/p cholecystectomy. She presented secondary to abdominal pain.   Assessment & Plan:   Principal Problem:   Nausea & vomiting Active Problems:   Marijuana abuse   Nausea/vomiting Abdominal pain Unknown etiology. CT scan unremarkable for etiology; no bowel obstruction. HIDA scan negative for bile leak. No evidence of acute pancreatitis. Lipase normal. Symptoms improved this morning but has not had anything to eat. -GI recommendations: Librax, Gastric emptying study (on hold secondary to recent narcotic use) -Continue protonix, GI cocktail -Continue Tums -Continue Zofran/phenergan -Advance to clear liquid today  Leukocytosis Unknown etiology. ?stress related. No evidence of infection. No diarrhea. Blood cultures pending. Trending down. -Repeat CBC -Pathology smear review pending -Blood cultures pending  Marijuana use Infrequent per patient.  Recent cholecystectomy General surgery signed off.  Tachycardia In setting of decreased oral intake, recurrent vomiting. -500 mL bolus followed by MIVF until she has more reliable oral intake   DVT prophylaxis: Lovenox Code Status:   Code Status: Full Code Family Communication: None at bedside Disposition Plan: Discharge pending medical improvement   Consultants:   General surgery  [facility]  Procedures:   None  Antimicrobials:  None    Subjective: No vomiting overnight but also has not taken anything by mouth  Objective: Vitals:   10/26/17 0408 10/26/17 1407 10/26/17 2211 10/27/17 0657  BP: 133/75 138/81 120/75 98/84  Pulse: 76 82 (!) 115 (!) 125  Resp: 14 16 16 16   Temp: 99.1 F (37.3 C) 98.7 F (37.1 C) 98.9 F (37.2 C) 98 F (36.7 C)  TempSrc: Oral Oral  Oral Oral  SpO2: 100% 100% 98% 97%  Weight:      Height:        Intake/Output Summary (Last 24 hours) at 10/27/2017 1029 Last data filed at 10/26/2017 1500 Gross per 24 hour  Intake -  Output 100 ml  Net -100 ml   Filed Weights   10/25/17 0056  Weight: (!) 136.5 kg    Examination:  General exam: Appears calm and comfortable Respiratory system: Clear to auscultation. Respiratory effort normal. Cardiovascular system: S1 & S2 heard, RRR. No murmurs, rubs, gallops or clicks. Gastrointestinal system: Abdomen is nondistended, soft and non-tender. No organomegaly or masses felt. Normal bowel sounds heard. Central nervous system: Alert and oriented. No focal neurological deficits. Extremities: No edema. No calf tenderness Skin: No cyanosis. No rashes Psychiatry: Judgement and insight appear normal. Mood & affect appropriate.     Data Reviewed: I have personally reviewed following labs and imaging studies  CBC: Recent Labs  Lab 10/24/17 1408 10/25/17 0115 10/26/17 0517 10/27/17 0816  WBC 23.5* 27.5* 18.8* 17.3*  NEUTROABS  --  23.8*  --   --   HGB 11.0* 10.9* 10.4* 11.0*  HCT 37.3 37.3 36.3 37.8  MCV 72.0* 71.7* 73.0* 71.3*  PLT 483* 550* 455* 557*   Basic Metabolic Panel: Recent Labs  Lab 10/24/17 1408 10/25/17 0115  NA 139 139  K 4.1 3.6  CL 108 106  CO2 21* 22  GLUCOSE 127* 135*  BUN 9 10  CREATININE 1.04* 1.02*  CALCIUM 9.3 9.6   GFR: Estimated Creatinine Clearance: 105.4 mL/min (A) (by C-G formula based on SCr of 1.02 mg/dL (H)). Liver Function Tests: Recent Labs  Lab 10/24/17 1408 10/25/17 0115  AST 29 36  ALT 26 32  ALKPHOS 127* 124  BILITOT 0.2* 0.1*  PROT 8.4* 9.3*  ALBUMIN 3.6 4.0   Recent Labs  Lab 10/24/17 1408  LIPASE 30   No results for input(s): AMMONIA in the last 168 hours. Coagulation Profile: No results for input(s): INR, PROTIME in the last 168 hours. Cardiac Enzymes: Recent Labs  Lab 10/25/17 0115  TROPONINI <0.03    BNP (last 3 results) No results for input(s): PROBNP in the last 8760 hours. HbA1C: No results for input(s): HGBA1C in the last 72 hours. CBG: No results for input(s): GLUCAP in the last 168 hours. Lipid Profile: No results for input(s): CHOL, HDL, LDLCALC, TRIG, CHOLHDL, LDLDIRECT in the last 72 hours. Thyroid Function Tests: No results for input(s): TSH, T4TOTAL, FREET4, T3FREE, THYROIDAB in the last 72 hours. Anemia Panel: No results for input(s): VITAMINB12, FOLATE, FERRITIN, TIBC, IRON, RETICCTPCT in the last 72 hours. Sepsis Labs: Recent Labs  Lab 10/24/17 2128  LATICACIDVEN 1.20    Recent Results (from the past 240 hour(s))  Culture, blood (routine x 2)     Status: None (Preliminary result)   Collection Time: 10/25/17  7:45 AM  Result Value Ref Range Status   Specimen Description   Final    BLOOD RIGHT ANTECUBITAL Performed at [facility] [address], [place], [place] [address]    Special Requests   Final    BOTTLES DRAWN AEROBIC ONLY Blood Culture adequate volume Performed at [facility] [address], [place], [place] [address]    Culture   Final    NO GROWTH 2 DAYS Performed at [facility] [address], [place], [place] [address]    Report Status PENDING  Incomplete  Culture, blood (routine x 2)     Status: None (Preliminary result)   Collection Time: 10/25/17  7:45 AM  Result Value Ref Range Status   Specimen Description   Final    BLOOD LEFT ANTECUBITAL Performed at [facility] [address] [place], [place] [address]    Special Requests   Final    BOTTLES DRAWN AEROBIC AND ANAEROBIC Blood Culture adequate volume Performed at [facility] [address], [place], [place] [address]    Culture   Final    NO GROWTH 2 DAYS Performed at [facility] [address], [place], [place] [address]    Report Status PENDING  Incomplete          Radiology Studies: No results found.      Scheduled Meds: . clidinium-chlordiazePOXIDE  1 capsule Oral TID AC  . mirtazapine  45 mg Oral QHS  . pantoprazole  40 mg Oral Daily  . traZODone  300 mg Oral QHS   Continuous Infusions: . sodium chloride 500 mL (10/27/17 1021)   Followed by  . sodium chloride       LOS: 2 days     Cordelia Poche, MD [facility] 10/27/2017, 10:29 AM  If 7PM-7AM, please contact night-coverage [email or web address]

## 2017-10-28 ENCOUNTER — Inpatient Hospital Stay (HOSPITAL_COMMUNITY): Payer: Medicare Other

## 2017-10-28 LAB — PATHOLOGIST SMEAR REVIEW

## 2017-10-28 MED ORDER — TECHNETIUM TC 99M SULFUR COLLOID
2.1000 | Freq: Once | INTRAVENOUS | Status: AC | PRN
  Administered 2017-10-28: 2.1 via INTRAVENOUS

## 2017-10-28 MED ORDER — MORPHINE SULFATE (PF) 2 MG/ML IV SOLN
2.0000 mg | INTRAVENOUS | Status: DC | PRN
  Administered 2017-10-28: 2 mg via INTRAVENOUS
  Filled 2017-10-28: qty 1

## 2017-10-28 MED ORDER — METOCLOPRAMIDE HCL 5 MG/ML IJ SOLN
10.0000 mg | Freq: Four times a day (QID) | INTRAMUSCULAR | Status: DC
  Administered 2017-10-28 – 2017-11-01 (×14): 10 mg via INTRAVENOUS
  Filled 2017-10-28 (×14): qty 2

## 2017-10-28 MED ORDER — SCOPOLAMINE 1 MG/3DAYS TD PT72
1.0000 | MEDICATED_PATCH | TRANSDERMAL | Status: DC
  Administered 2017-10-28: 1.5 mg via TRANSDERMAL
  Filled 2017-10-28: qty 1

## 2017-10-28 NOTE — Progress Notes (Signed)
Gastric emptying study today, results pending.  Revisit tomorrow.

## 2017-10-28 NOTE — Progress Notes (Signed)
PROGRESS NOTE    [name]  WNI:[id number] DOB: [birth year] DOA: 10/24/2017 PCP: Patient, No Pcp Per   Brief Narrative: Haley Freeman is a 42 y.o. female with a history of alcohol abuse, marijuana use, depression, s/p cholecystectomy. She presented secondary to abdominal pain.   Assessment & Plan:   Principal Problem:   Nausea & vomiting Active Problems:   Marijuana abuse   Nausea/vomiting Abdominal pain Unknown etiology. CT scan unremarkable for etiology; no bowel obstruction. HIDA scan negative for bile leak. No evidence of acute pancreatitis. Lipase normal. Worsened symptoms after attempting to take by mouth yesterday. -GI recommendations: Librax, Gastric emptying study -Continue protonix, GI cocktail -Continue Tums -Continue Zofran/phenergan  Leukocytosis Unknown etiology. ?stress related. No evidence of infection. No diarrhea. Blood cultures no growth to date. Trending down. -Repeat CBC -Blood cultures pending  Marijuana use Infrequent per patient.  Recent cholecystectomy General surgery signed off.  Tachycardia In setting of decreased oral intake, recurrent vomiting. Improved. -Continue IV fluids   DVT prophylaxis: Lovenox Code Status:   Code Status: Full Code Family Communication: None at bedside Disposition Plan: Discharge pending medical improvement   Consultants:   General surgery  [facility]  Procedures:   None  Antimicrobials:  None    Subjective: Vomiting yesterday. Abdominal pain.   Objective: Vitals:   10/27/17 1412 10/27/17 1541 10/27/17 2038 10/28/17 0555  BP: 138/79 121/81 (!) 150/92 (!) 149/91  Pulse: 88 85 82 84  Resp: 20 18 19 18   Temp:  98.4 F (36.9 C) 98.7 F (37.1 C) 99 F (37.2 C)  TempSrc:  Oral Oral Oral  SpO2: 100% 100% 100% 99%  Weight:      Height:        Intake/Output Summary (Last 24 hours) at 10/28/2017 1238 Last data filed at 10/28/2017 0900 Gross per 24 hour    Intake 2501.57 ml  Output 400 ml  Net 2101.57 ml   Filed Weights   10/25/17 0056  Weight: (!) 136.5 kg    Examination:  General exam: Appears calm and comfortable    Data Reviewed: I have personally reviewed following labs and imaging studies  CBC: Recent Labs  Lab 10/24/17 1408 10/25/17 0115 10/26/17 0517 10/27/17 0816  WBC 23.5* 27.5* 18.8* 17.3*  NEUTROABS  --  23.8*  --   --   HGB 11.0* 10.9* 10.4* 11.0*  HCT 37.3 37.3 36.3 37.8  MCV 72.0* 71.7* 73.0* 71.3*  PLT 483* 550* 455* 381*   Basic Metabolic Panel: Recent Labs  Lab 10/24/17 1408 10/25/17 0115  NA 139 139  K 4.1 3.6  CL 108 106  CO2 21* 22  GLUCOSE 127* 135*  BUN 9 10  CREATININE 1.04* 1.02*  CALCIUM 9.3 9.6   GFR: Estimated Creatinine Clearance: 105.4 mL/min (A) (by C-G formula based on SCr of 1.02 mg/dL (H)). Liver Function Tests: Recent Labs  Lab 10/24/17 1408 10/25/17 0115  AST 29 36  ALT 26 32  ALKPHOS 127* 124  BILITOT 0.2* 0.1*  PROT 8.4* 9.3*  ALBUMIN 3.6 4.0   Recent Labs  Lab 10/24/17 1408  LIPASE 30   No results for input(s): AMMONIA in the last 168 hours. Coagulation Profile: No results for input(s): INR, PROTIME in the last 168 hours. Cardiac Enzymes: Recent Labs  Lab 10/25/17 0115  TROPONINI <0.03   BNP (last 3 results) No results for input(s): PROBNP in the last 8760 hours. HbA1C: No results for input(s): HGBA1C in the last 72 hours. CBG: No results  for input(s): GLUCAP in the last 168 hours. Lipid Profile: No results for input(s): CHOL, HDL, LDLCALC, TRIG, CHOLHDL, LDLDIRECT in the last 72 hours. Thyroid Function Tests: No results for input(s): TSH, T4TOTAL, FREET4, T3FREE, THYROIDAB in the last 72 hours. Anemia Panel: No results for input(s): VITAMINB12, FOLATE, FERRITIN, TIBC, IRON, RETICCTPCT in the last 72 hours. Sepsis Labs: Recent Labs  Lab 10/24/17 2128  LATICACIDVEN 1.20    Recent Results (from the past 240 hour(s))  Culture, blood  (routine x 2)     Status: None (Preliminary result)   Collection Time: 10/25/17  7:45 AM  Result Value Ref Range Status   Specimen Description   Final    BLOOD RIGHT ANTECUBITAL Performed at [facility] [address] [place], [place] [address]    Special Requests   Final    BOTTLES DRAWN AEROBIC ONLY Blood Culture adequate volume Performed at [facility] [address], [place], [place] [address]    Culture   Final    NO GROWTH 3 DAYS Performed at [facility], [place] [address], [place], [place] [address]    Report Status PENDING  Incomplete  Culture, blood (routine x 2)     Status: None (Preliminary result)   Collection Time: 10/25/17  7:45 AM  Result Value Ref Range Status   Specimen Description   Final    BLOOD LEFT ANTECUBITAL Performed at [facility] [address], [place], [place] [address]    Special Requests   Final    BOTTLES DRAWN AEROBIC AND ANAEROBIC Blood Culture adequate volume Performed at [facility] [address] [place], [place] [address]    Culture   Final    NO GROWTH 3 DAYS Performed at [facility], [place] [address] [place], [place] [address]    Report Status PENDING  Incomplete         Radiology Studies: No results found.      Scheduled Meds: . clidinium-chlordiazePOXIDE  1 capsule Oral TID AC  . mirtazapine  45 mg Oral QHS  . pantoprazole  40 mg Oral Daily  . traZODone  300 mg Oral QHS   Continuous Infusions: . sodium chloride 100 mL/hr at 10/28/17 0600     LOS: 3 days     Cordelia Poche, MD [facility] 10/28/2017, 12:38 PM  If 7PM-7AM, please contact night-coverage [email or web address]

## 2017-10-28 NOTE — Care Management Important Message (Signed)
Important Message  Patient Details  Name: Haley Freeman MRN: [id number] Date of Birth: November 24, 1975   Medicare Important Message Given:  Yes    Kerin Salen 10/28/2017, 10:08 AMImportant Message  Patient Details  Name: Haley Freeman MRN: [id number] Date of Birth: 09/05/1975   Medicare Important Message Given:  Yes    Kerin Salen 10/28/2017, 10:08 AM

## 2017-10-29 DIAGNOSIS — K3184 Gastroparesis: Principal | ICD-10-CM

## 2017-10-29 DIAGNOSIS — R112 Nausea with vomiting, unspecified: Secondary | ICD-10-CM

## 2017-10-29 LAB — CBC
HCT: 32.9 % — ABNORMAL LOW (ref 36.0–46.0)
HEMOGLOBIN: 9.5 g/dL — AB (ref 12.0–15.0)
MCH: 20.8 pg — AB (ref 26.0–34.0)
MCHC: 28.9 g/dL — AB (ref 30.0–36.0)
MCV: 72 fL — ABNORMAL LOW (ref 80.0–100.0)
Platelets: 322 10*3/uL (ref 150–400)
RBC: 4.57 MIL/uL (ref 3.87–5.11)
RDW: 17.9 % — ABNORMAL HIGH (ref 11.5–15.5)
WBC: 16.4 10*3/uL — ABNORMAL HIGH (ref 4.0–10.5)
nRBC: 0 % (ref 0.0–0.2)

## 2017-10-29 LAB — BASIC METABOLIC PANEL
Anion gap: 8 (ref 5–15)
BUN: 10 mg/dL (ref 6–20)
CALCIUM: 8.4 mg/dL — AB (ref 8.9–10.3)
CO2: 23 mmol/L (ref 22–32)
Chloride: 108 mmol/L (ref 98–111)
Creatinine, Ser: 0.95 mg/dL (ref 0.44–1.00)
GFR calc Af Amer: >60 mL/min (ref >60)
GLUCOSE: 84 mg/dL (ref 70–99)
POTASSIUM: 3.2 mmol/L — AB (ref 3.5–5.1)
Sodium: 139 mmol/L (ref 135–145)

## 2017-10-29 LAB — HEMOGLOBIN A1C
HEMOGLOBIN A1C: 6.3 % — AB (ref 4.8–5.6)
MEAN PLASMA GLUCOSE: 134.11 mg/dL

## 2017-10-29 NOTE — Progress Notes (Signed)
[facility] Progress Note  Haley Freeman 42 y.o. 1975-08-02   Subjective: Hungry stating that she immediately became hungry after receiving a dose of Reglan. Denies abdominal pain. No N/V. Good spirits.  Objective: Vital signs: Vitals:   10/28/17 2033 10/29/17 0546  BP: 105/68 106/72  Pulse: 85 84  Resp: 18 17  Temp: 97.8 F (36.6 C) 98 F (36.7 C)  SpO2: 100% 99%    Physical Exam: Gen: alert, no acute distress, obese HEENT: anicteric sclera CV: RRR Chest: CTA B Abd: soft, nontender, nondistended, +BS Ext: no edema  Lab Results: Recent Labs    10/29/17 0537  NA 139  K 3.2*  CL 108  CO2 23  GLUCOSE 84  BUN 10  CREATININE 0.95  CALCIUM 8.4*   No results for input(s): AST, ALT, ALKPHOS, BILITOT, PROT, ALBUMIN in the last 72 hours. Recent Labs    10/27/17 0816 10/29/17 0537  WBC 17.3* 16.4*  HGB 11.0* 9.5*  HCT 37.8 32.9*  MCV 71.3* 72.0*  PLT 504* 322      Assessment/Plan: Gastroparesis (idiopathic) - On IV Reglan. If tolerates full liquids then advance as tolerated and d/c home on PO Reglan to use for no longer than 4-6 weeks. Avoid narcotic pain meds. F/U with me in 3-4 weeks. Will sign off. Call if questions.   Lear Ng 10/29/2017, 12:35 PM  Questions please call [phone] ID: Haley Freeman, female   DOB: 09/24/1975, 42 y.o.   MRN: [id number]

## 2017-10-29 NOTE — Progress Notes (Signed)
[facility] PROGRESS NOTE    Progress Note  Haley Freeman  KGY:[id number] DOB: June 23, 1975 DOA: 10/24/2017 PCP: Patient, No Pcp Per     Brief Narrative:   Haley Freeman is an 42 y.o. female past medical history of alcohol abuse and marijuana presents on the day of admission with abdominal pain nausea and vomiting  Assessment/Plan:   Principal Problem:   Nausea & vomiting Active Problems:   Marijuana abuse Nausea and vomiting and abdominal pain: Of unclear etiology, CT scan of the abdomen was unremarkable, HIDA scan was negative for bile leak. No evidence of pancreatitis. GI was consulted recommended a gastric emptying study that showed gastroparesis.  Patient is currently on Reglan seems to be working as she is extremely hungry, will advance her diet to full liquid diet. Continue Protonix Tums, Zofran and Phenergan  Leukocytosis: Of unclear etiology no evidence of infection. Is slowly going down.  Marijuana use: Frequently she has been counseled.  Recent cholecystectomy: HIDA scan was negative surgery has signed off.  Sinus tachycardia: The setting of decreased oral intake and vomiting likely due to hypovolemia has improved with IV fluids.  DVT prophylaxis: lovenox Family Communication:none Disposition Plan/Barrier to D/C: home in 1 day Code Status:     Code Status Orders  (From admission, onward)         Start     Ordered   10/25/17 0035  Full code  Continuous     10/25/17 0035        Code Status History    Date Active Date Inactive Code Status Order ID Comments User Context   09/26/2017 1548 10/02/2017 1248 Full Code [id number]  Caren Griffins, MD Inpatient        IV Access:    Peripheral IV   Procedures and diagnostic studies:   Nm Gastric Emptying  Result Date: 10/28/2017 CLINICAL DATA:  Epigastric pain with nausea and vomiting EXAM: NUCLEAR MEDICINE GASTRIC EMPTYING SCAN TECHNIQUE: After oral ingestion of  radiolabeled meal, sequential abdominal images were obtained for 4 hours. Percentage of activity emptying the stomach was calculated at 1 hour, 2 hour, 3 hour, and 4 hours. RADIOPHARMACEUTICALS:  2.1 mCi Tc-40m sulfur colloid in standardized meal including egg COMPARISON:  None. FINDINGS: Expected location of the stomach in the left upper quadrant. Ingested meal empties the stomach gradually over the course of the study. 2.3% emptied at 1 hr ( normal >= 10%) 8.7% emptied at 2 hr ( normal >= 40%) 9.8% emptied at 3 hr ( normal >= 70%) Approximately 30% emptied at 4 hr ( normal >= 90%) IMPRESSION: Significantly delayed gastric emptying study. 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 A degree of gastric outlet obstruction cannot be excluded. Electronically Signed   By: Lowella Grip III M.D.   On: 10/28/2017 15:23     Medical Consultants:    None.  Anti-Infectives:   None  Subjective:    Haley Freeman no new complaints she has not thrown up since yesterday evening, she relates that she is extremely hungry.  Objective:    Vitals:   10/27/17 2038 10/28/17 0555 10/28/17 2033 10/29/17 0546  BP: (!) 150/92 (!) 149/91 105/68 106/72  Pulse: 82 84 85 84  Resp: 19 18 18 17   Temp: 98.7 F (37.1 C) 99 F (37.2 C) 97.8 F (36.6 C) 98 F (36.7 C)  TempSrc: Oral Oral Oral Oral  SpO2: 100% 99% 100% 99%  Weight:      Height:  Intake/Output Summary (Last 24 hours) at 10/29/2017 1158 Last data filed at 10/28/2017 1840 Gross per 24 hour  Intake 0 ml  Output -  Net 0 ml   Filed Weights   10/25/17 0056  Weight: (!) 136.5 kg    Exam: General exam: In no acute distress. Respiratory system: Good air movement and clear to auscultation. Cardiovascular system: S1 & S2 heard, RRR.  Gastrointestinal system: Abdomen is nondistended, soft and nontender.  Central nervous system: Alert and oriented. No focal neurological deficits. Extremities: No pedal  edema. Skin: No rashes, lesions or ulcers Psychiatry: Judgement and insight appear normal. Mood & affect appropriate.    Data Reviewed:    Labs: Basic Metabolic Panel: Recent Labs  Lab 10/24/17 1408 10/25/17 0115 10/29/17 0537  NA 139 139 139  K 4.1 3.6 3.2*  CL 108 106 108  CO2 21* 22 23  GLUCOSE 127* 135* 84  BUN 9 10 10   CREATININE 1.04* 1.02* 0.95  CALCIUM 9.3 9.6 8.4*   GFR Estimated Creatinine Clearance: 113.1 mL/min (by C-G formula based on SCr of 0.95 mg/dL). Liver Function Tests: Recent Labs  Lab 10/24/17 1408 10/25/17 0115  AST 29 36  ALT 26 32  ALKPHOS 127* 124  BILITOT 0.2* 0.1*  PROT 8.4* 9.3*  ALBUMIN 3.6 4.0   Recent Labs  Lab 10/24/17 1408  LIPASE 30   No results for input(s): AMMONIA in the last 168 hours. Coagulation profile No results for input(s): INR, PROTIME in the last 168 hours.  CBC: Recent Labs  Lab 10/24/17 1408 10/25/17 0115 10/26/17 0517 10/27/17 0816 10/29/17 0537  WBC 23.5* 27.5* 18.8* 17.3* 16.4*  NEUTROABS  --  23.8*  --   --   --   HGB 11.0* 10.9* 10.4* 11.0* 9.5*  HCT 37.3 37.3 36.3 37.8 32.9*  MCV 72.0* 71.7* 73.0* 71.3* 72.0*  PLT 483* 550* 455* 504* 322   Cardiac Enzymes: Recent Labs  Lab 10/25/17 0115  TROPONINI <0.03   BNP (last 3 results) No results for input(s): PROBNP in the last 8760 hours. CBG: No results for input(s): GLUCAP in the last 168 hours. D-Dimer: No results for input(s): DDIMER in the last 72 hours. Hgb A1c: No results for input(s): HGBA1C in the last 72 hours. Lipid Profile: No results for input(s): CHOL, HDL, LDLCALC, TRIG, CHOLHDL, LDLDIRECT in the last 72 hours. Thyroid function studies: No results for input(s): TSH, T4TOTAL, T3FREE, THYROIDAB in the last 72 hours.  Invalid input(s): FREET3 Anemia work up: No results for input(s): VITAMINB12, FOLATE, FERRITIN, TIBC, IRON, RETICCTPCT in the last 72 hours. Sepsis Labs: Recent Labs  Lab 10/24/17 2128 10/25/17 0115  10/26/17 0517 10/27/17 0816 10/29/17 0537  WBC  --  27.5* 18.8* 17.3* 16.4*  LATICACIDVEN 1.20  --   --   --   --    Microbiology Recent Results (from the past 240 hour(s))  Culture, blood (routine x 2)     Status: None (Preliminary result)   Collection Time: 10/25/17  7:45 AM  Result Value Ref Range Status   Specimen Description   Final    BLOOD RIGHT ANTECUBITAL Performed at [facility], [place] [address], [place], [place] [address]    Special Requests   Final    BOTTLES DRAWN AEROBIC ONLY Blood Culture adequate volume Performed at [facility] [address], [place], [place] [address]    Culture   Final    NO GROWTH 4 DAYS Performed at [facility],  [address], [place], [place] [address]    Report Status PENDING  Incomplete  Culture, blood (routine x 2)     Status: None (Preliminary result)   Collection Time: 10/25/17  7:45 AM  Result Value Ref Range Status   Specimen Description   Final    BLOOD LEFT ANTECUBITAL Performed at [facility] [address], [place], [place] [address]    Special Requests   Final    BOTTLES DRAWN AEROBIC AND ANAEROBIC Blood Culture adequate volume Performed at [facility] [address], [place], [place] [address]    Culture   Final    NO GROWTH 4 DAYS Performed at [facility], [place] [address], [place],  [address]    Report Status PENDING  Incomplete     Medications:   . clidinium-chlordiazePOXIDE  1 capsule Oral TID AC  . metoCLOPramide (REGLAN) injection  10 mg Intravenous Q6H  . mirtazapine  45 mg Oral QHS  . pantoprazole  40 mg Oral Daily  . scopolamine  1 patch Transdermal Q72H  . traZODone  300 mg Oral QHS   Continuous Infusions: . sodium chloride 100 mL/hr at 10/28/17 0600     LOS: 4 days   Charlynne Cousins  [facility] Pager [phone]  *Please refer to [email or web address], password [email or web address] to get updated schedule on  who will round on this patient, as hospitalists switch teams weekly. If 7PM-7AM, please contact night-coverage at [email or web address], password [email or web address] for any overnight needs.  10/29/2017, 11:58 AM

## 2017-10-29 NOTE — Progress Notes (Signed)
Pt had I and 1/2 bowls of creamed soup, 240 oz of tea. 120 ice cream she denies  n/v or discomfot

## 2017-10-30 DIAGNOSIS — R1013 Epigastric pain: Secondary | ICD-10-CM

## 2017-10-30 DIAGNOSIS — F1210 Cannabis abuse, uncomplicated: Secondary | ICD-10-CM

## 2017-10-30 LAB — CULTURE, BLOOD (ROUTINE X 2)
Culture: NO GROWTH
Culture: NO GROWTH
SPECIAL REQUESTS: ADEQUATE
Special Requests: ADEQUATE

## 2017-10-30 MED ORDER — DIPHENHYDRAMINE HCL 50 MG/ML IJ SOLN
25.0000 mg | Freq: Once | INTRAMUSCULAR | Status: AC
  Administered 2017-10-30: 25 mg via INTRAVENOUS
  Filled 2017-10-30: qty 1

## 2017-10-30 MED ORDER — METOCLOPRAMIDE HCL 5 MG PO TABS: 5.0000 mg | ORAL_TABLET | Freq: Three times a day (TID) | ORAL | 3 refills | Status: DC

## 2017-10-30 MED ORDER — SIMETHICONE 80 MG PO CHEW: 80.0000 mg | CHEWABLE_TABLET | Freq: Four times a day (QID) | ORAL | 2 refills | Status: DC | PRN

## 2017-10-30 NOTE — Discharge Summary (Signed)
Physician Discharge Summary  CENIYAH THORP KYH:[id number] DOB: 05-28-1975 DOA: 10/24/2017  PCP: Patient, No Pcp Per  Admit date: 10/24/2017 Discharge date: 10/30/2017  Admitted From: home Disposition:  Home  Recommendations for Outpatient Follow-up:  1. Follow up with PCP in 1-2 weeks 2. Please obtain BMP/CBC in one week   Home Health:NO Equipment/Devices:none  Discharge Condition:stable CODE STATUS:full Diet recommendation: Heart Healthy  Brief/Interim Summary:  42 y.o. female past medical history of alcohol abuse and marijuana presents on the day of admission with abdominal pain nausea and vomiting  Discharge Diagnoses:  Principal Problem:   Nausea & vomiting Active Problems:   Marijuana abuse Nausea vomiting and abdominal pain: In the setting of narcotics and marijuana use. HIDA scan was done that was negative for leak, there was no evidence of pancreatitis. GI was consulted recommended a gastric emptying study that showed gastroparesis. She was started on Reglan and she was able to eat and tolerate her diet. She will be continue at home on Protonix Zofran, Reglan and simethicone for gas.  Leukocytosis: Likely stress demargination no signs of infection.  Marijuana use: She was counseled.  Sinus tachycardia: The setting of decreased oral intake and vomiting question due to hypovolemia now resolved.   Discharge Instructions  Discharge Instructions    Diet - low sodium heart healthy   Complete by:  As directed    Increase activity slowly   Complete by:  As directed      Allergies as of 10/30/2017      Reactions   Strawberry (diagnostic) Shortness Of Breath   Penicillins Swelling   Swells throat. Has patient had a PCN reaction causing immediate rash, facial/tongue/throat swelling, SOB or lightheadedness with hypotension: Yes Has patient had a PCN reaction causing severe rash involving mucus membranes or skin necrosis: No Has patient had a PCN  reaction that required hospitalization: Yes Has patient had a PCN reaction occurring within the last 10 years: No If all of the above answers are "NO", then may proceed with Cephalosporin use.      Medication List    STOP taking these medications   [facility] MELATONIN 5 MG Tabs Generic drug:  Melatonin   oxyCODONE 5 MG immediate release tablet Commonly known as:  Oxy IR/ROXICODONE   ranitidine 300 MG tablet Commonly known as:  ZANTAC     TAKE these medications   alum & mag hydroxide-simeth 151-761-60 MG/5ML suspension Commonly known as:  MAALOX/MYLANTA Take 15 mLs by mouth every 4 (four) hours as needed for indigestion or heartburn.   hydrOXYzine 25 MG tablet Commonly known as:  ATARAX/VISTARIL Take 50 mg by mouth at bedtime as needed for nausea or vomiting.   metoCLOPramide 5 MG tablet Commonly known as:  REGLAN Take 1 tablet (5 mg total) by mouth 3 (three) times daily before meals.   mirtazapine 45 MG tablet Commonly known as:  REMERON Take 45 mg by mouth at bedtime.   ondansetron 4 MG disintegrating tablet Commonly known as:  ZOFRAN-ODT Take 1 tablet (4 mg total) by mouth every 8 (eight) hours as needed. What changed:  reasons to take this   pantoprazole 40 MG tablet Commonly known as:  PROTONIX Take 1 tablet (40 mg total) by mouth 2 (two) times daily. What changed:    when to take this  reasons to take this   promethazine 25 MG tablet Commonly known as:  PHENERGAN Take 1 tablet (25 mg total) by mouth every 6 (six) hours as needed for nausea or vomiting.  simethicone 80 MG chewable tablet Commonly known as:  MYLICON Chew 1 tablet (80 mg total) by mouth 4 (four) times daily as needed for flatulence.   traZODone 100 MG tablet Commonly known as:  DESYREL Take 300 mg by mouth at bedtime.      Follow-up [facility] Follow up.   Why:  OR this can become your primary doctor. Contact information: [place] [address] [facility].   Specialty:  Internal Medicine Why:  OR this can become your primary doctor. Contact information: [place] [address] [phone]         Allergies  Allergen Reactions  . Strawberry (Diagnostic) Shortness Of Breath  . Penicillins Swelling    Swells throat. Has patient had a PCN reaction causing immediate rash, facial/tongue/throat swelling, SOB or lightheadedness with hypotension: Yes Has patient had a PCN reaction causing severe rash involving mucus membranes or skin necrosis: No Has patient had a PCN reaction that required hospitalization: Yes Has patient had a PCN reaction occurring within the last 10 years: No If all of the above answers are "NO", then may proceed with Cephalosporin use.     Consultations:  gi   Procedures/Studies: Nm Hepatobiliary Liver Func  Result Date: 10/25/2017 CLINICAL DATA:  42 y/o F; cholecystectomy 10/01/2017. Epigastric pain with nausea and vomiting starting 2 days ago. Rule out bile leak. EXAM: NUCLEAR MEDICINE HEPATOBILIARY IMAGING TECHNIQUE: Sequential images of the abdomen were obtained out to 60 minutes following intravenous administration of radiopharmaceutical. RADIOPHARMACEUTICALS:  5.1 mCi Tc-65m  Choletec IV COMPARISON:  10/24/2017 CT abdomen and pelvis. FINDINGS: Prompt uptake and biliary excretion of activity by the liver is seen. No gallbladder activity is visualized. Biliary activity passes into small bowel, consistent with patent common bile duct. Reflux into the stomach is noted. No bile leak identified. IMPRESSION: No bile leak identified.  Patent common bile duct. Electronically Signed   By: Kristine Garbe M.D.   On: 10/25/2017 06:24   Nm Gastric Emptying  Result Date: 10/28/2017 CLINICAL DATA:  Epigastric pain with nausea and vomiting EXAM: NUCLEAR MEDICINE GASTRIC EMPTYING SCAN  TECHNIQUE: After oral ingestion of radiolabeled meal, sequential abdominal images were obtained for 4 hours. Percentage of activity emptying the stomach was calculated at 1 hour, 2 hour, 3 hour, and 4 hours. RADIOPHARMACEUTICALS:  2.1 mCi Tc-17m sulfur colloid in standardized meal including egg COMPARISON:  None. FINDINGS: Expected location of the stomach in the left upper quadrant. Ingested meal empties the stomach gradually over the course of the study. 2.3% emptied at 1 hr ( normal >= 10%) 8.7% emptied at 2 hr ( normal >= 40%) 9.8% emptied at 3 hr ( normal >= 70%) Approximately 30% emptied at 4 hr ( normal >= 90%) IMPRESSION: Significantly delayed gastric emptying study. 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 A degree of gastric outlet obstruction cannot be excluded. Electronically Signed   By: Lowella Grip III M.D.   On: 10/28/2017 15:23   Ct Abdomen Pelvis W Contrast  Result Date: 10/24/2017 CLINICAL DATA:  Abdomen pain with nausea and vomiting gallbladder removal 10/01/2017 EXAM: CT ABDOMEN AND PELVIS WITH CONTRAST TECHNIQUE: Multidetector CT imaging of the abdomen and pelvis was performed using the standard protocol following bolus administration of intravenous contrast. CONTRAST:  144mL ISOVUE-300 IOPAMIDOL (ISOVUE-300) INJECTION 61% COMPARISON:  CT 09/27/2017, 08/29 /  2019, 08/21/2016 FINDINGS: Lower chest: Lung bases demonstrate no acute consolidation or effusion. Heart size upper limits normal. Small hiatal hernia. Hepatobiliary: Status post cholecystectomy. No biliary dilatation. Posterior right lobe hemangioma. Pancreas: Unremarkable. No pancreatic ductal dilatation or surrounding inflammatory changes. Spleen: Normal in size without focal abnormality. Adrenals/Urinary Tract: 2 cm left adrenal gland myelolipoma. Right adrenal gland is normal. Kidneys are normal, without renal calculi, focal lesion, or hydronephrosis. Bladder is unremarkable. Stomach/Bowel: Stomach is within  normal limits. Appendix appears normal. No evidence of bowel wall thickening, distention, or inflammatory changes. Vascular/Lymphatic: No significant vascular findings are present. No enlarged abdominal or pelvic lymph nodes. Reproductive: Uterus and bilateral adnexa are unremarkable. Other: No abdominal wall hernia or abnormality. No abdominopelvic ascites. Musculoskeletal: No acute or significant osseous findings. IMPRESSION: 1. No CT evidence for acute intra-abdominal or pelvic abnormality. 2. Vague hypodensity in the posterior right hepatic lobe, likely corresponding to previously noted hemangioma 3. Interval cholecystectomy.  No biliary dilatation 4. 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 Electronically Signed   By: Donavan Foil M.D.   On: 10/24/2017 19:41     Subjective: No complains  Discharge Exam: Vitals:   10/29/17 2107 10/30/17 0501  BP: 113/68 109/71  Pulse: 89 81  Resp: 18 18  Temp: 98.7 F (37.1 C) 98.1 F (36.7 C)  SpO2: 99% 100%   Vitals:   10/29/17 0930 10/29/17 1400 10/29/17 2107 10/30/17 0501  BP: (!) 114/98 108/71 113/68 109/71  Pulse: 80 84 89 81  Resp: 17 17 18 18   Temp: 98.6 F (37 C) 98.6 F (37 C) 98.7 F (37.1 C) 98.1 F (36.7 C)  TempSrc: Oral Oral Oral Oral  SpO2: 98% 99% 99% 100%  Weight:      Height:        General: Pt is alert, awake, not in acute distress Cardiovascular: RRR, S1/S2 +, no rubs, no gallops Respiratory: CTA bilaterally, no wheezing, no rhonchi Abdominal: Soft, NT, ND, bowel sounds + Extremities: no edema, no cyanosis    The results of significant diagnostics from this hospitalization (including imaging, microbiology, ancillary and laboratory) are listed below for reference.     Microbiology: Recent Results (from the past 240 hour(s))  Culture, blood (routine x 2)     Status: None   Collection Time: 10/25/17  7:45 AM  Result Value Ref Range Status   Specimen Description   Final    BLOOD RIGHT  ANTECUBITAL Performed at [facility] [address] [place], [place] [address]    Special Requests   Final    BOTTLES DRAWN AEROBIC ONLY Blood Culture adequate volume Performed at [facility] [address] [place], [place] [address]    Culture   Final    NO GROWTH 5 DAYS Performed at [facility], [place] [address], [place], [place] [address]    Report Status 10/30/2017 FINAL  Final  Culture, blood (routine x 2)     Status: None   Collection Time: 10/25/17  7:45 AM  Result Value Ref Range Status   Specimen Description   Final    BLOOD LEFT ANTECUBITAL Performed at [facility] [address], [place], [place] [address]    Special Requests   Final    BOTTLES DRAWN AEROBIC AND ANAEROBIC Blood Culture adequate volume Performed at [facility] [address] [place], [place] [address]    Culture   Final    NO GROWTH 5 DAYS Performed at  [facility], [place] [address] [place], [place] [address]    Report Status 10/30/2017 FINAL  Final     Labs: BNP (last 3 results) No results for input(s): BNP in the last 8760 hours. Basic Metabolic Panel: Recent Labs  Lab 10/24/17 1408 10/25/17 0115 10/29/17 0537  NA 139 139 139  K 4.1 3.6 3.2*  CL 108 106 108  CO2 21* 22 23  GLUCOSE 127* 135* 84  BUN 9 10 10   CREATININE 1.04* 1.02* 0.95  CALCIUM 9.3 9.6 8.4*   Liver Function Tests: Recent Labs  Lab 10/24/17 1408 10/25/17 0115  AST 29 36  ALT 26 32  ALKPHOS 127* 124  BILITOT 0.2* 0.1*  PROT 8.4* 9.3*  ALBUMIN 3.6 4.0   Recent Labs  Lab 10/24/17 1408  LIPASE 30   No results for input(s): AMMONIA in the last 168 hours. CBC: Recent Labs  Lab 10/24/17 1408 10/25/17 0115 10/26/17 0517 10/27/17 0816 10/29/17 0537  WBC 23.5* 27.5* 18.8* 17.3* 16.4*  NEUTROABS  --  23.8*  --   --   --   HGB 11.0* 10.9* 10.4* 11.0* 9.5*  HCT 37.3 37.3 36.3 37.8 32.9*  MCV 72.0* 71.7* 73.0*  71.3* 72.0*  PLT 483* 550* 455* 504* 322   Cardiac Enzymes: Recent Labs  Lab 10/25/17 0115  TROPONINI <0.03   BNP: Invalid input(s): POCBNP CBG: No results for input(s): GLUCAP in the last 168 hours. D-Dimer No results for input(s): DDIMER in the last 72 hours. Hgb A1c Recent Labs    10/29/17 0537  HGBA1C 6.3*   Lipid Profile No results for input(s): CHOL, HDL, LDLCALC, TRIG, CHOLHDL, LDLDIRECT in the last 72 hours. Thyroid function studies No results for input(s): TSH, T4TOTAL, T3FREE, THYROIDAB in the last 72 hours.  Invalid input(s): FREET3 Anemia work up No results for input(s): VITAMINB12, FOLATE, FERRITIN, TIBC, IRON, RETICCTPCT in the last 72 hours. Urinalysis    Component Value Date/Time   COLORURINE YELLOW 10/24/2017 2121   APPEARANCEUR CLEAR 10/24/2017 2121   LABSPEC >1.046 (H) 10/24/2017 2121   PHURINE 6.0 10/24/2017 2121   GLUCOSEU NEGATIVE 10/24/2017 2121   HGBUR NEGATIVE 10/24/2017 2121   [place] NEGATIVE 10/24/2017 2121   KETONESUR 20 (A) 10/24/2017 2121   PROTEINUR NEGATIVE 10/24/2017 2121   UROBILINOGEN 0.2 09/22/2014 0730   NITRITE NEGATIVE 10/24/2017 2121   LEUKOCYTESUR NEGATIVE 10/24/2017 2121   Sepsis Labs Invalid input(s): PROCALCITONIN,  WBC,  LACTICIDVEN Microbiology Recent Results (from the past 240 hour(s))  Culture, blood (routine x 2)     Status: None   Collection Time: 10/25/17  7:45 AM  Result Value Ref Range Status   Specimen Description   Final    BLOOD RIGHT ANTECUBITAL Performed at [facility], [place] [address] [place], [place] [address]    Special Requests   Final    BOTTLES DRAWN AEROBIC ONLY Blood Culture adequate volume Performed at [facility] [address], [place], [place] [address]    Culture   Final    NO GROWTH 5 DAYS Performed at [facility], [place] [address] [place], [place] [address]    Report Status 10/30/2017 FINAL  Final  Culture, blood (routine x 2)      Status: None   Collection Time: 10/25/17  7:45 AM  Result Value Ref Range Status   Specimen Description   Final    BLOOD LEFT ANTECUBITAL Performed at [facility] [address] [place], [place]  [address]    Special Requests   Final    BOTTLES DRAWN AEROBIC AND ANAEROBIC Blood Culture adequate volume Performed at [facility] [address], [place], [place] [address]    Culture   Final    NO GROWTH 5 DAYS Performed at [facility], [place] [address] [place], [place] [address]    Report Status 10/30/2017 FINAL  Final     Time coordinating discharge: 40 minutes  SIGNED:   Charlynne Cousins, MD  [facility] 10/30/2017, 11:33 AM Pager [phone]  If 7PM-7AM, please contact night-coverage [email or web address] Password [email or web address]

## 2017-10-31 MED ORDER — POTASSIUM CHLORIDE 10 MEQ/100ML IV SOLN
10.0000 meq | INTRAVENOUS | Status: AC
  Administered 2017-10-31 (×5): 10 meq via INTRAVENOUS
  Filled 2017-10-31 (×5): qty 100

## 2017-10-31 MED ORDER — PROCHLORPERAZINE EDISYLATE 10 MG/2ML IJ SOLN
10.0000 mg | Freq: Four times a day (QID) | INTRAMUSCULAR | Status: DC | PRN
  Administered 2017-10-31 (×2): 10 mg via INTRAVENOUS
  Filled 2017-10-31 (×2): qty 2

## 2017-10-31 NOTE — Progress Notes (Signed)
[facility] PROGRESS NOTE    Progress Note  Haley Freeman  VFI:[id number] DOB: 1975/07/30 DOA: 10/24/2017 PCP: Patient, No Pcp Per     Brief Narrative:   Haley Freeman is an 42 y.o. female past medical history of alcohol abuse and marijuana presents on the day of admission with abdominal pain nausea and vomiting  Assessment/Plan:   Principal Problem:   Nausea & vomiting Active Problems:   Marijuana abuse Nausea and vomiting and abdominal pain: Gastroparesis, with ongoing vomiting and nausea not able to tolerate her food. Continue Protonix, Phenergan and Reglan.  Check a 12-lead EKG in the morning  Leukocytosis: Evidence of infection, likely stress demargination slowly going down.  Marijuana use: Frequently she has been counseled.  Recent cholecystectomy: HIDA scan was negative surgery has signed off.  Sinus tachycardia: The setting of decreased oral intake and vomiting likely due to hypovolemia has improved with IV fluids.  DVT prophylaxis: lovenox Family Communication:none Disposition Plan/Barrier to D/C: Unable to determine. Code Status:     Code Status Orders  (From admission, onward)         Start     Ordered   10/25/17 0035  Full code  Continuous     10/25/17 0035        Code Status History    Date Active Date Inactive Code Status Order ID Comments User Context   09/26/2017 1548 10/02/2017 1248 Full Code [id number]  Caren Griffins, MD Inpatient        IV Access:    Peripheral IV   Procedures and diagnostic studies:   No results found.   Medical Consultants:    None.  Anti-Infectives:   None  Subjective:    Haley Freeman threw up with food last night, still nauseated this morning.  Objective:    Vitals:   10/30/17 1312 10/30/17 2114 10/30/17 2313 10/31/17 0441  BP: 100/87 (!) 153/109 (!) 156/93 110/77  Pulse: 98 95 88 85  Resp: 16 18  18   Temp: 99.1 F (37.3 C) 98.9 F (37.2 C)  98.6 F  (37 C)  TempSrc: Oral Oral  Oral  SpO2: (!) 88% 99%  98%  Weight:      Height:        Intake/Output Summary (Last 24 hours) at 10/31/2017 1133 Last data filed at 10/31/2017 0516 Gross per 24 hour  Intake 180 ml  Output -  Net 180 ml   Filed Weights   10/25/17 0056  Weight: (!) 136.5 kg    Exam: General exam: In no acute distress. Respiratory system: Good air movement and clear to auscultation. Cardiovascular system: S1 & S2 heard, RRR.  Gastrointestinal system: Abdomen is nondistended, soft and nontender.  Central nervous system: Alert and oriented. No focal neurological deficits. Extremities: No pedal edema. Skin: No rashes, lesions or ulcers Psychiatry: Judgement and insight appear normal. Mood & affect appropriate.    Data Reviewed:    Labs: Basic Metabolic Panel: Recent Labs  Lab 10/24/17 1408 10/25/17 0115 10/29/17 0537  NA 139 139 139  K 4.1 3.6 3.2*  CL 108 106 108  CO2 21* 22 23  GLUCOSE 127* 135* 84  BUN 9 10 10   CREATININE 1.04* 1.02* 0.95  CALCIUM 9.3 9.6 8.4*   GFR Estimated Creatinine Clearance: 113.1 mL/min (by C-G formula based on SCr of 0.95 mg/dL). Liver Function Tests: Recent Labs  Lab 10/24/17 1408 10/25/17 0115  AST 29 36  ALT 26 32  ALKPHOS 127* 124  BILITOT  0.2* 0.1*  PROT 8.4* 9.3*  ALBUMIN 3.6 4.0   Recent Labs  Lab 10/24/17 1408  LIPASE 30   No results for input(s): AMMONIA in the last 168 hours. Coagulation profile No results for input(s): INR, PROTIME in the last 168 hours.  CBC: Recent Labs  Lab 10/24/17 1408 10/25/17 0115 10/26/17 0517 10/27/17 0816 10/29/17 0537  WBC 23.5* 27.5* 18.8* 17.3* 16.4*  NEUTROABS  --  23.8*  --   --   --   HGB 11.0* 10.9* 10.4* 11.0* 9.5*  HCT 37.3 37.3 36.3 37.8 32.9*  MCV 72.0* 71.7* 73.0* 71.3* 72.0*  PLT 483* 550* 455* 504* 322   Cardiac Enzymes: Recent Labs  Lab 10/25/17 0115  TROPONINI <0.03   BNP (last 3 results) No results for input(s): PROBNP in the last  8760 hours. CBG: No results for input(s): GLUCAP in the last 168 hours. D-Dimer: No results for input(s): DDIMER in the last 72 hours. Hgb A1c: Recent Labs    10/29/17 0537  HGBA1C 6.3*   Lipid Profile: No results for input(s): CHOL, HDL, LDLCALC, TRIG, CHOLHDL, LDLDIRECT in the last 72 hours. Thyroid function studies: No results for input(s): TSH, T4TOTAL, T3FREE, THYROIDAB in the last 72 hours.  Invalid input(s): FREET3 Anemia work up: No results for input(s): VITAMINB12, FOLATE, FERRITIN, TIBC, IRON, RETICCTPCT in the last 72 hours. Sepsis Labs: Recent Labs  Lab 10/24/17 2128 10/25/17 0115 10/26/17 0517 10/27/17 0816 10/29/17 0537  WBC  --  27.5* 18.8* 17.3* 16.4*  LATICACIDVEN 1.20  --   --   --   --    Microbiology Recent Results (from the past 240 hour(s))  Culture, blood (routine x 2)     Status: None   Collection Time: 10/25/17  7:45 AM  Result Value Ref Range Status   Specimen Description   Final    BLOOD RIGHT ANTECUBITAL Performed at [facility], [place] [address], [place], [place] [address]    Special Requests   Final    BOTTLES DRAWN AEROBIC ONLY Blood Culture adequate volume Performed at [facility] [address] [place], [place] [address]    Culture   Final    NO GROWTH 5 DAYS Performed at [facility], [place] [address] [place], [place] [address]    Report Status 10/30/2017 FINAL  Final  Culture, blood (routine x 2)     Status: None   Collection Time: 10/25/17  7:45 AM  Result Value Ref Range Status   Specimen Description   Final    BLOOD LEFT ANTECUBITAL Performed at [facility] [address], [place], [place] [address]    Special Requests   Final    BOTTLES DRAWN AEROBIC AND ANAEROBIC Blood Culture adequate volume Performed at [facility] [address], [place], [place] [address]    Culture   Final    NO GROWTH 5 DAYS Performed at [facility], [place] [address], [place], [place] [address]    Report Status 10/30/2017 FINAL  Final     Medications:   . clidinium-chlordiazePOXIDE  1 capsule Oral TID AC  . metoCLOPramide (REGLAN) injection  10 mg Intravenous Q6H  . mirtazapine  45 mg Oral QHS  . pantoprazole  40 mg Oral Daily  . traZODone  300 mg Oral QHS   Continuous Infusions: . potassium chloride       LOS: 6 days   [facility] Pager [phone]  *  Please refer to [email or web address], password [email or web address] to get updated schedule on who will round on this patient, as hospitalists switch teams weekly. If 7PM-7AM, please contact night-coverage at [email or web address], password [email or web address] for any overnight needs.  10/31/2017, 11:33 AM

## 2017-11-01 DIAGNOSIS — R1115 Cyclical vomiting syndrome unrelated to migraine: Secondary | ICD-10-CM

## 2017-11-01 MED ORDER — SODIUM CHLORIDE 0.9 % IV BOLUS
1500.0000 mL | Freq: Once | INTRAVENOUS | Status: AC
  Administered 2017-11-01: 1500 mL via INTRAVENOUS

## 2017-11-01 NOTE — Discharge Summary (Addendum)
Physician Discharge Summary  Haley Freeman JGG:[id number] DOB: [birth year] DOA: 10/24/2017  PCP: Patient, No Pcp Per  Admit date: 10/24/2017 Discharge date: 11/01/2017  Admitted From: Home Disposition:  Home  Recommendations for Outpatient Follow-up:  1. Follow up with Gastro in 1-2 weeks 2. Please obtain BMP/CBC in one week   Home Health:No Equipment/Devices:none  Discharge Condition:stable CODE STATUS:full Diet recommendation: Heart Healthy   Brief/Interim Summary: 42 y.o.femalepast medical history of alcohol abuse and marijuana presents on the day of admission with abdominal pain nausea and vomitings  Discharge Diagnoses:  Principal Problem:   Nausea & vomiting Active Problems:   Marijuana abuse   Cyclic vomiting syndrome Intractable nausea and vomiting likely due to cyclic vomiting syndrome in the setting of cannabis use: HIDA scan was negative for leak there was no evidence of pancreatitis. GI was consulted recommended a gastric emptying study that showed a decreased in emptying. She was feeds slowly was continued IV Zofran Reglan and Protonix and her nausea improved.  Leukocytosis: Likely due to stress demargination no signs of infection.  Marijuana use: She was counseled about abstinence.  Sinus tachycardia: In the setting of decreased oral intake.   Discharge Instructions  Discharge Instructions    Diet - low sodium heart healthy   Complete by:  As directed    Diet - low sodium heart healthy   Complete by:  As directed    Increase activity slowly   Complete by:  As directed    Increase activity slowly   Complete by:  As directed      Allergies as of 11/01/2017      Reactions   Strawberry (diagnostic) Shortness Of Breath   Penicillins Swelling   Swells throat. Has patient had a PCN reaction causing immediate rash, facial/tongue/throat swelling, SOB or lightheadedness with hypotension: Yes Has patient had a PCN reaction causing severe rash  involving mucus membranes or skin necrosis: No Has patient had a PCN reaction that required hospitalization: Yes Has patient had a PCN reaction occurring within the last 10 years: No If all of the above answers are "NO", then may proceed with Cephalosporin use.      Medication List    STOP taking these medications   [facility] MELATONIN 5 MG Tabs Generic drug:  Melatonin   oxyCODONE 5 MG immediate release tablet Commonly known as:  Oxy IR/ROXICODONE   ranitidine 300 MG tablet Commonly known as:  ZANTAC     TAKE these medications   alum & mag hydroxide-simeth 546-503-54 MG/5ML suspension Commonly known as:  MAALOX/MYLANTA Take 15 mLs by mouth every 4 (four) hours as needed for indigestion or heartburn.   hydrOXYzine 25 MG tablet Commonly known as:  ATARAX/VISTARIL Take 50 mg by mouth at bedtime as needed for nausea or vomiting.   metoCLOPramide 5 MG tablet Commonly known as:  REGLAN Take 1 tablet (5 mg total) by mouth 3 (three) times daily before meals.   mirtazapine 45 MG tablet Commonly known as:  REMERON Take 45 mg by mouth at bedtime.   ondansetron 4 MG disintegrating tablet Commonly known as:  ZOFRAN-ODT Take 1 tablet (4 mg total) by mouth every 8 (eight) hours as needed. What changed:  reasons to take this   pantoprazole 40 MG tablet Commonly known as:  PROTONIX Take 1 tablet (40 mg total) by mouth 2 (two) times daily. What changed:    when to take this  reasons to take this   promethazine 25 MG tablet Commonly known as:  PHENERGAN  Take 1 tablet (25 mg total) by mouth every 6 (six) hours as needed for nausea or vomiting.   simethicone 80 MG chewable tablet Commonly known as:  MYLICON Chew 1 tablet (80 mg total) by mouth 4 (four) times daily as needed for flatulence.   traZODone 100 MG tablet Commonly known as:  DESYREL Take 300 mg by mouth at bedtime.      Follow-up [facility] Follow up.   Why:  OR this can  become your primary doctor. Contact information: [place] [phone] [facility].   Specialty:  Internal Medicine Why:  OR this can become your primary doctor. Contact information: [place] [address] [phone]         Allergies  Allergen Reactions  . Strawberry (Diagnostic) Shortness Of Breath  . Penicillins Swelling    Swells throat. Has patient had a PCN reaction causing immediate rash, facial/tongue/throat swelling, SOB or lightheadedness with hypotension: Yes Has patient had a PCN reaction causing severe rash involving mucus membranes or skin necrosis: No Has patient had a PCN reaction that required hospitalization: Yes Has patient had a PCN reaction occurring within the last 10 years: No If all of the above answers are "NO", then may proceed with Cephalosporin use.     Consultations:  GI   Procedures/Studies: Nm Hepatobiliary Liver Func  Result Date: 10/25/2017 CLINICAL DATA:  42 y/o F; cholecystectomy 10/01/2017. Epigastric pain with nausea and vomiting starting 2 days ago. Rule out bile leak. EXAM: NUCLEAR MEDICINE HEPATOBILIARY IMAGING TECHNIQUE: Sequential images of the abdomen were obtained out to 60 minutes following intravenous administration of radiopharmaceutical. RADIOPHARMACEUTICALS:  5.1 mCi Tc-51m  Choletec IV COMPARISON:  10/24/2017 CT abdomen and pelvis. FINDINGS: Prompt uptake and biliary excretion of activity by the liver is seen. No gallbladder activity is visualized. Biliary activity passes into small bowel, consistent with patent common bile duct. Reflux into the stomach is noted. No bile leak identified. IMPRESSION: No bile leak identified.  Patent common bile duct. Electronically Signed   By: Kristine Garbe M.D.   On: 10/25/2017 06:24   Nm Gastric Emptying  Result Date: 10/28/2017 CLINICAL DATA:  Epigastric pain with  nausea and vomiting EXAM: NUCLEAR MEDICINE GASTRIC EMPTYING SCAN TECHNIQUE: After oral ingestion of radiolabeled meal, sequential abdominal images were obtained for 4 hours. Percentage of activity emptying the stomach was calculated at 1 hour, 2 hour, 3 hour, and 4 hours. RADIOPHARMACEUTICALS:  2.1 mCi Tc-63m sulfur colloid in standardized meal including egg COMPARISON:  None. FINDINGS: Expected location of the stomach in the left upper quadrant. Ingested meal empties the stomach gradually over the course of the study. 2.3% emptied at 1 hr ( normal >= 10%) 8.7% emptied at 2 hr ( normal >= 40%) 9.8% emptied at 3 hr ( normal >= 70%) Approximately 30% emptied at 4 hr ( normal >= 90%) IMPRESSION: Significantly delayed gastric emptying study. 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 A degree of gastric outlet obstruction cannot be excluded. Electronically Signed   By: Lowella Grip III M.D.   On: 10/28/2017 15:23   Ct Abdomen Pelvis W Contrast  Result Date: 10/24/2017 CLINICAL DATA:  Abdomen pain with nausea and vomiting gallbladder removal 10/01/2017 EXAM: CT ABDOMEN AND PELVIS WITH CONTRAST TECHNIQUE: Multidetector CT imaging of the abdomen and pelvis was performed using the standard  protocol following bolus administration of intravenous contrast. CONTRAST:  174mL ISOVUE-300 IOPAMIDOL (ISOVUE-300) INJECTION 61% COMPARISON:  CT 09/27/2017, 08/29 /2019, 08/21/2016 FINDINGS: Lower chest: Lung bases demonstrate no acute consolidation or effusion. Heart size upper limits normal. Small hiatal hernia. Hepatobiliary: Status post cholecystectomy. No biliary dilatation. Posterior right lobe hemangioma. Pancreas: Unremarkable. No pancreatic ductal dilatation or surrounding inflammatory changes. Spleen: Normal in size without focal abnormality. Adrenals/Urinary Tract: 2 cm left adrenal gland myelolipoma. Right adrenal gland is normal. Kidneys are normal, without renal calculi, focal lesion, or  hydronephrosis. Bladder is unremarkable. Stomach/Bowel: Stomach is within normal limits. Appendix appears normal. No evidence of bowel wall thickening, distention, or inflammatory changes. Vascular/Lymphatic: No significant vascular findings are present. No enlarged abdominal or pelvic lymph nodes. Reproductive: Uterus and bilateral adnexa are unremarkable. Other: No abdominal wall hernia or abnormality. No abdominopelvic ascites. Musculoskeletal: No acute or significant osseous findings. IMPRESSION: 1. No CT evidence for acute intra-abdominal or pelvic abnormality. 2. Vague hypodensity in the posterior right hepatic lobe, likely corresponding to previously noted hemangioma 3. Interval cholecystectomy.  No biliary dilatation 4. 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 Electronically Signed   By: Donavan Foil M.D.   On: 10/24/2017 19:41    Subjective: No complains  Discharge Exam: Vitals:   10/31/17 2038 11/01/17 0513  BP: 118/87 122/90  Pulse: 100 98  Resp: 14 16  Temp: 97.8 F (36.6 C) 97.9 F (36.6 C)  SpO2: 100% 98%   Vitals:   10/31/17 0441 10/31/17 1257 10/31/17 2038 11/01/17 0513  BP: 110/77 135/83 118/87 122/90  Pulse: 85 91 100 98  Resp: 18 15 14 16   Temp: 98.6 F (37 C) 98.6 F (37 C) 97.8 F (36.6 C) 97.9 F (36.6 C)  TempSrc: Oral Oral Oral Oral  SpO2: 98% 100% 100% 98%  Weight:      Height:        General: Pt is alert, awake, not in acute distress Cardiovascular: RRR, S1/S2 +, no rubs, no gallops Respiratory: CTA bilaterally, no wheezing, no rhonchi Abdominal: Soft, NT, ND, bowel sounds + Extremities: no edema, no cyanosis    The results of significant diagnostics from this hospitalization (including imaging, microbiology, ancillary and laboratory) are listed below for reference.     Microbiology: Recent Results (from the past 240 hour(s))  Culture, blood (routine x 2)     Status: None   Collection Time: 10/25/17  7:45 AM  Result Value Ref Range  Status   Specimen Description   Final    BLOOD RIGHT ANTECUBITAL Performed at [facility] [address], [place], [place] [address]    Special Requests   Final    BOTTLES DRAWN AEROBIC ONLY Blood Culture adequate volume Performed at [facility] [address] [place], [place] [address]    Culture   Final    NO GROWTH 5 DAYS Performed at [facility], [place] [address], [place], [place] [address]    Report Status 10/30/2017 FINAL  Final  Culture, blood (routine x 2)     Status: None   Collection Time: 10/25/17  7:45 AM  Result Value Ref Range Status   Specimen Description   Final    BLOOD LEFT ANTECUBITAL Performed at [facility] [address], [place],  [address]    Special Requests   Final    BOTTLES DRAWN AEROBIC AND ANAEROBIC Blood Culture adequate volume Performed at [facility] [address] [place],  [place] [address]    Culture   Final    NO GROWTH 5 DAYS Performed at [facility], [place] [address] [place], [place] [address]    Report Status 10/30/2017 FINAL  Final     Labs: BNP (last 3 results) No results for input(s): BNP in the last 8760 hours. Basic Metabolic Panel: Recent Labs  Lab 10/29/17 0537  NA 139  K 3.2*  CL 108  CO2 23  GLUCOSE 84  BUN 10  CREATININE 0.95  CALCIUM 8.4*   Liver Function Tests: No results for input(s): AST, ALT, ALKPHOS, BILITOT, PROT, ALBUMIN in the last 168 hours. No results for input(s): LIPASE, AMYLASE in the last 168 hours. No results for input(s): AMMONIA in the last 168 hours. CBC: Recent Labs  Lab 10/26/17 0517 10/27/17 0816 10/29/17 0537  WBC 18.8* 17.3* 16.4*  HGB 10.4* 11.0* 9.5*  HCT 36.3 37.8 32.9*  MCV 73.0* 71.3* 72.0*  PLT 455* 504* 322   Cardiac Enzymes: No results for input(s): CKTOTAL, CKMB, CKMBINDEX, TROPONINI in the last 168 hours. BNP: Invalid input(s): POCBNP CBG: No results for input(s):  GLUCAP in the last 168 hours. D-Dimer No results for input(s): DDIMER in the last 72 hours. Hgb A1c No results for input(s): HGBA1C in the last 72 hours. Lipid Profile No results for input(s): CHOL, HDL, LDLCALC, TRIG, CHOLHDL, LDLDIRECT in the last 72 hours. Thyroid function studies No results for input(s): TSH, T4TOTAL, T3FREE, THYROIDAB in the last 72 hours.  Invalid input(s): FREET3 Anemia work up No results for input(s): VITAMINB12, FOLATE, FERRITIN, TIBC, IRON, RETICCTPCT in the last 72 hours. Urinalysis    Component Value Date/Time   COLORURINE YELLOW 10/24/2017 2121   APPEARANCEUR CLEAR 10/24/2017 2121   LABSPEC >1.046 (H) 10/24/2017 2121   PHURINE 6.0 10/24/2017 2121   GLUCOSEU NEGATIVE 10/24/2017 2121   HGBUR NEGATIVE 10/24/2017 2121   [place] NEGATIVE 10/24/2017 2121   KETONESUR 20 (A) 10/24/2017 2121   PROTEINUR NEGATIVE 10/24/2017 2121   UROBILINOGEN 0.2 09/22/2014 0730   NITRITE NEGATIVE 10/24/2017 2121   LEUKOCYTESUR NEGATIVE 10/24/2017 2121   Sepsis Labs Invalid input(s): PROCALCITONIN,  WBC,  LACTICIDVEN Microbiology Recent Results (from the past 240 hour(s))  Culture, blood (routine x 2)     Status: None   Collection Time: 10/25/17  7:45 AM  Result Value Ref Range Status   Specimen Description   Final    BLOOD RIGHT ANTECUBITAL Performed at [facility], [place] [address] [place], [place] [address]    Special Requests   Final    BOTTLES DRAWN AEROBIC ONLY Blood Culture adequate volume Performed at [facility] [address] [place], [place] [address]    Culture   Final    NO GROWTH 5 DAYS Performed at [facility], [place] [address], [place], [place] [address]    Report Status 10/30/2017 FINAL  Final  Culture, blood (routine x 2)     Status: None   Collection Time: 10/25/17  7:45 AM  Result Value Ref Range Status   Specimen Description   Final    BLOOD LEFT ANTECUBITAL Performed at [facility] [address], [place], [place] [address]    Special Requests   Final    BOTTLES DRAWN AEROBIC AND ANAEROBIC Blood Culture adequate volume Performed at [facility] [address], [place], [place] [address]    Culture   Final    NO GROWTH 5 DAYS Performed at  [facility], [place] [address], [place], [place] [address]    Report Status 10/30/2017 FINAL  Final     Time coordinating discharge: 40 minutes  SIGNED:   Charlynne Cousins, MD  [facility] 11/01/2017, 11:04 AM Pager   If 7PM-7AM, please contact night-coverage [email or web address] Password [email or web address]

## 2017-11-01 NOTE — Discharge Instructions (Signed)
Hilma Steward-Epps was admitted to the Hospital on 10/24/2017 and Discharged on Discharge Date 11/01/2017 and should be excused from work/school   for 4   days starting 10/24/2017 , may return to work/school without any restrictions.  Call Bess Harvest MD, [facility] [phone] with questions.  Charlynne Cousins M.D on 11/01/2017,at 1:54 PM  [facility] Office  [phone]

## 2017-11-04 ENCOUNTER — Other Ambulatory Visit: Payer: Self-pay

## 2017-11-04 ENCOUNTER — Ambulatory Visit (INDEPENDENT_AMBULATORY_CARE_PROVIDER_SITE_OTHER): Payer: Medicare Other | Admitting: Physician Assistant

## 2017-11-04 ENCOUNTER — Encounter (INDEPENDENT_AMBULATORY_CARE_PROVIDER_SITE_OTHER): Payer: Self-pay | Admitting: Physician Assistant

## 2017-11-04 VITALS — BP 107/79 | HR 98 | Temp 98.2°F | Ht 68.0 in | Wt 309.5 lb

## 2017-11-04 DIAGNOSIS — Z09 Encounter for follow-up examination after completed treatment for conditions other than malignant neoplasm: Secondary | ICD-10-CM | POA: Diagnosis not present

## 2017-11-04 DIAGNOSIS — D649 Anemia, unspecified: Secondary | ICD-10-CM | POA: Diagnosis not present

## 2017-11-04 DIAGNOSIS — E876 Hypokalemia: Secondary | ICD-10-CM | POA: Diagnosis not present

## 2017-11-04 NOTE — Progress Notes (Signed)
Subjective:  Patient ID: Haley Freeman, female    DOB: 14-Jan-1975  Age: 42 y.o. MRN: [id number]  CC: hospital f/u   HPI Haley Freeman is a 42 y.o. female with a medical history of alcohol abuse, cannabis hyperemesis syndrome, anxiety, depression, headache, and neuromuscular disorder presents on hospital discharge f/u. Admitted on 10/25/17. Diagnosed with "Intractable nausea and vomiting likely due to cyclic vomiting syndrome in the setting of cannabis use, Leukocytosis, Marijuana use, Sinus tachycardia". Labs reveled anemia and electrolyte abnormalities.Discharged on 11/01/17. Pt feeling better now. Says she has not used marijuana "in forever" but realized it may have been recently she had smoked marijuana. Says she has not drank alcohol since discharge from the hospital. Does not endorse any bleeding diathesis. She is no longer vomiting. Does not endorse any other symptoms or complaints.       Outpatient Medications Prior to Visit  Medication Sig Dispense Refill  . hydrOXYzine (ATARAX/VISTARIL) 25 MG tablet Take 50 mg by mouth at bedtime as needed for nausea or vomiting.   2  . metoCLOPramide (REGLAN) 5 MG tablet Take 1 tablet (5 mg total) by mouth 3 (three) times daily before meals. 90 tablet 3  . mirtazapine (REMERON) 45 MG tablet Take 45 mg by mouth at bedtime.   2  . promethazine (PHENERGAN) 25 MG tablet Take 1 tablet (25 mg total) by mouth every 6 (six) hours as needed for nausea or vomiting. 20 tablet 0  . simethicone (GAS-X) 80 MG chewable tablet Chew 1 tablet (80 mg total) by mouth 4 (four) times daily as needed for flatulence. 100 tablet 2  . traZODone (DESYREL) 100 MG tablet Take 300 mg by mouth at bedtime.     [facility] Kitchen alum & mag hydroxide-simeth (MAALOX/MYLANTA) 200-200-20 MG/5ML suspension Take 15 mLs by mouth every 4 (four) hours as needed for indigestion or heartburn. 355 mL 0  . pantoprazole (PROTONIX) 40 MG tablet Take 1 tablet (40 mg total) by mouth 2 (two) times  daily. (Patient not taking: Reported on 11/04/2017) 20 tablet 0  . ondansetron (ZOFRAN ODT) 4 MG disintegrating tablet Take 1 tablet (4 mg total) by mouth every 8 (eight) hours as needed. (Patient taking differently: Take 4 mg by mouth every 8 (eight) hours as needed for nausea or vomiting. ) 10 tablet 0   No facility-administered medications prior to visit.      ROS Review of Systems  Constitutional: Negative for chills, fever and malaise/fatigue.  Eyes: Negative for blurred vision.  Respiratory: Negative for shortness of breath.   Cardiovascular: Negative for chest pain and palpitations.  Gastrointestinal: Negative for abdominal pain and nausea.  Genitourinary: Negative for dysuria and hematuria.  Musculoskeletal: Negative for joint pain and myalgias.  Skin: Negative for rash.  Neurological: Negative for tingling and headaches.  Psychiatric/Behavioral: Negative for depression. The patient is not nervous/anxious.     Objective:  BP 107/79 (BP Location: Left Arm, Patient Position: Sitting, Cuff Size: Large)   Pulse 98   Temp 98.2 F (36.8 C) (Oral)   Ht 5\' 8"  (1.727 m)   Wt (!) 309 lb 8 oz (140.4 kg)   LMP 10/26/2017   SpO2 97%   BMI 47.06 kg/m   BP/Weight 11/04/2017 11/01/2017 26/83/4196  Systolic BP 222 90 -  Diastolic BP 79 74 -  Wt. (Lbs) 309.5 - 300.93  BMI 47.06 - 45.76  Some encounter information is confidential and restricted. Go to Review Flowsheets activity to see all data.  Physical Exam  Constitutional: She is oriented to person, place, and time.  Well developed, obese, NAD, polite  HENT:  Head: Normocephalic and atraumatic.  Eyes: No scleral icterus.  Neck: Normal range of motion. Neck supple. No thyromegaly present.  Cardiovascular: Normal rate, regular rhythm and normal heart sounds.  Pulmonary/Chest: Effort normal and breath sounds normal.  Musculoskeletal: She exhibits no edema.  Neurological: She is alert and oriented to person, place, and  time.  Skin: Skin is warm and dry. No rash noted. No erythema. No pallor.  Psychiatric: She has a normal mood and affect. Her behavior is normal. Thought content normal.  Vitals reviewed.    Assessment & Plan:   1. Hospital discharge follow-up - Notes reviewed  2. Anemia, unspecified type - CBC with Differential - Iron and TIBC([facility])  3. Hypokalemia - Basic Metabolic Panel  4. Hypocalcemia - Basic Metabolic Panel  *No other issues could be addressed at this visit due to patient needing to pick up child from school.  Follow-up: Return in about 4 weeks (around 12/02/2017) for general health maintenance.   Clent Demark PA

## 2017-11-04 NOTE — Patient Instructions (Signed)
Anemia Anemia is a condition in which you do not have enough red blood cells or hemoglobin. Hemoglobin is a substance in red blood cells that carries oxygen. When you do not have enough red blood cells or hemoglobin (are anemic), your body cannot get enough oxygen and your organs may not work properly. As a result, you may feel very tired or have other problems. What are the causes? Common causes of anemia include:  Excessive bleeding. Anemia can be caused by excessive bleeding inside or outside the body, including bleeding from the intestine or from periods in women.  Poor nutrition.  Long-lasting (chronic) kidney, thyroid, and liver disease.  Bone marrow disorders.  Cancer and treatments for cancer.  HIV (human immunodeficiency virus) and AIDS (acquired immunodeficiency syndrome).  Treatments for HIV and AIDS.  Spleen problems.  Blood disorders.  Infections, medicines, and autoimmune disorders that destroy red blood cells.  What are the signs or symptoms? Symptoms of this condition include:  Minor weakness.  Dizziness.  Headache.  Feeling heartbeats that are irregular or faster than normal (palpitations).  Shortness of breath, especially with exercise.  Paleness.  Cold sensitivity.  Indigestion.  Nausea.  Difficulty sleeping.  Difficulty concentrating.  Symptoms may occur suddenly or develop slowly. If your anemia is mild, you may not have symptoms. How is this diagnosed? This condition is diagnosed based on:  Blood tests.  Your medical history.  A physical exam.  Bone marrow biopsy.  Your health care provider may also check your stool (feces) for blood and may do additional testing to look for the cause of your bleeding. You may also have other tests, including:  Imaging tests, such as a CT scan or MRI.  Endoscopy.  Colonoscopy.  How is this treated? Treatment for this condition depends on the cause. If you continue to lose a lot of blood,  you may need to be treated at a hospital. Treatment may include:  Taking supplements of iron, vitamin P38, or folic acid.  Taking a hormone medicine (erythropoietin) that can help to stimulate red blood cell growth.  Having a blood transfusion. This may be needed if you lose a lot of blood.  Making changes to your diet.  Having surgery to remove your spleen.  Follow these instructions at home:  Take over-the-counter and prescription medicines only as told by your health care provider.  Take supplements only as told by your health care provider.  Follow any diet instructions that you were given.  Keep all follow-up visits as told by your health care provider. This is important. Contact a health care provider if:  You develop new bleeding anywhere in the body. Get help right away if:  You are very weak.  You are short of breath.  You have pain in your abdomen or chest.  You are dizzy or feel faint.  You have trouble concentrating.  You have bloody or black, tarry stools.  You vomit repeatedly or you vomit up blood. Summary  Anemia is a condition in which you do not have enough red blood cells or enough of a substance in your red blood cells that carries oxygen (hemoglobin).  Symptoms may occur suddenly or develop slowly.  If your anemia is mild, you may not have symptoms.  This condition is diagnosed with blood tests as well as a medical history and physical exam. Other tests may be needed.  Treatment for this condition depends on the cause of the anemia. This information is not intended to replace advice  given to you by your health care provider. Make sure you discuss any questions you have with your health care provider. Document Released: 02/01/2004 Document Revised: 01/26/2016 Document Reviewed: 01/26/2016 Elsevier Interactive Patient Education  [employer].

## 2017-11-05 ENCOUNTER — Telehealth (INDEPENDENT_AMBULATORY_CARE_PROVIDER_SITE_OTHER): Payer: Self-pay

## 2017-11-05 ENCOUNTER — Other Ambulatory Visit (INDEPENDENT_AMBULATORY_CARE_PROVIDER_SITE_OTHER): Payer: Self-pay | Admitting: Physician Assistant

## 2017-11-05 DIAGNOSIS — D649 Anemia, unspecified: Secondary | ICD-10-CM

## 2017-11-05 LAB — CBC WITH DIFFERENTIAL/PLATELET
BASOS ABS: 0.1 10*3/uL (ref 0.0–0.2)
Basos: 1 %
EOS (ABSOLUTE): 0.5 10*3/uL — ABNORMAL HIGH (ref 0.0–0.4)
EOS: 4 %
HEMATOCRIT: 33 % — AB (ref 34.0–46.6)
HEMOGLOBIN: 9.9 g/dL — AB (ref 11.1–15.9)
Immature Grans (Abs): 0 10*3/uL (ref 0.0–0.1)
Immature Granulocytes: 0 %
LYMPHS ABS: 3.8 10*3/uL — AB (ref 0.7–3.1)
LYMPHS: 31 %
MCH: 20.9 pg — AB (ref 26.6–33.0)
MCHC: 30 g/dL — ABNORMAL LOW (ref 31.5–35.7)
MCV: 70 fL — ABNORMAL LOW (ref 79–97)
MONOCYTES: 5 %
Monocytes Absolute: 0.6 10*3/uL (ref 0.1–0.9)
NEUTROS ABS: 7.4 10*3/uL — AB (ref 1.4–7.0)
Neutrophils: 59 %
Platelets: 358 10*3/uL (ref 150–450)
RBC: 4.74 x10E6/uL (ref 3.77–5.28)
RDW: 17.5 % — ABNORMAL HIGH (ref 12.3–15.4)
WBC: 12.3 10*3/uL — ABNORMAL HIGH (ref 3.4–10.8)

## 2017-11-05 LAB — BASIC METABOLIC PANEL
BUN/Creatinine Ratio: 10 (ref 9–23)
BUN: 9 mg/dL (ref 6–24)
CHLORIDE: 107 mmol/L — AB (ref 96–106)
CO2: 23 mmol/L (ref 20–29)
CREATININE: 0.87 mg/dL (ref 0.57–1.00)
Calcium: 9 mg/dL (ref 8.7–10.2)
GFR calc non Af Amer: 82 mL/min/{1.73_m2} (ref >59)
GFR, EST AFRICAN AMERICAN: 95 mL/min/{1.73_m2} (ref >59)
GLUCOSE: 110 mg/dL — AB (ref 65–99)
Potassium: 4 mmol/L (ref 3.5–5.2)
Sodium: 143 mmol/L (ref 134–144)

## 2017-11-05 LAB — IRON AND TIBC
IRON SATURATION: 5 % — AB (ref 15–55)
Iron: 17 ug/dL — ABNORMAL LOW (ref 27–159)
Total Iron Binding Capacity: 368 ug/dL (ref 250–450)
UIBC: 351 ug/dL (ref 131–425)

## 2017-11-05 MED ORDER — FERROUS SULFATE 325 (65 FE) MG PO TBEC: 325.0000 mg | DELAYED_RELEASE_TABLET | Freq: Three times a day (TID) | ORAL | 0 refills | Status: DC

## 2017-11-05 NOTE — Telephone Encounter (Signed)
-----   Message from Clent Demark, PA-C sent at 11/05/2017  8:44 AM EDT ----- Iron deficiency anemia likely. [facility]. Potassium and Calcium are back to normal.

## 2017-11-05 NOTE — Telephone Encounter (Signed)
Patient is aware of iron deficiency likely anemia, [facility] [address] Potassium and calcium back to normal. Patient expressed understanding. Nat Christen, CMA

## 2017-11-08 ENCOUNTER — Other Ambulatory Visit (INDEPENDENT_AMBULATORY_CARE_PROVIDER_SITE_OTHER): Payer: Self-pay | Admitting: Physician Assistant

## 2017-11-08 DIAGNOSIS — D649 Anemia, unspecified: Secondary | ICD-10-CM

## 2017-11-10 NOTE — Telephone Encounter (Signed)
FWD to PCP. Darryle Dennie S Darell Saputo, CMA  

## 2017-11-17 DIAGNOSIS — Z8659 Personal history of other mental and behavioral disorders: Secondary | ICD-10-CM | POA: Diagnosis not present

## 2017-11-17 DIAGNOSIS — F4310 Post-traumatic stress disorder, unspecified: Secondary | ICD-10-CM | POA: Diagnosis not present

## 2017-11-17 DIAGNOSIS — K3184 Gastroparesis: Secondary | ICD-10-CM | POA: Diagnosis not present

## 2017-11-17 DIAGNOSIS — Z6841 Body Mass Index (BMI) 40.0 and over, adult: Secondary | ICD-10-CM | POA: Diagnosis not present

## 2017-12-02 ENCOUNTER — Ambulatory Visit (INDEPENDENT_AMBULATORY_CARE_PROVIDER_SITE_OTHER): Payer: Medicare Other | Admitting: Physician Assistant

## 2017-12-02 ENCOUNTER — Encounter (INDEPENDENT_AMBULATORY_CARE_PROVIDER_SITE_OTHER): Payer: Self-pay | Admitting: Physician Assistant

## 2017-12-02 VITALS — BP 121/85 | HR 99 | Temp 98.1°F | Resp 18 | Ht 69.0 in | Wt 318.0 lb

## 2017-12-02 DIAGNOSIS — D649 Anemia, unspecified: Secondary | ICD-10-CM

## 2017-12-02 DIAGNOSIS — Z8742 Personal history of other diseases of the female genital tract: Secondary | ICD-10-CM

## 2017-12-02 DIAGNOSIS — Z6841 Body Mass Index (BMI) 40.0 and over, adult: Secondary | ICD-10-CM | POA: Diagnosis not present

## 2017-12-02 DIAGNOSIS — Z8349 Family history of other endocrine, nutritional and metabolic diseases: Secondary | ICD-10-CM | POA: Diagnosis not present

## 2017-12-02 NOTE — Progress Notes (Signed)
Subjective:  Patient ID: Haley Freeman, female    DOB: 1975-12-12  Age: 42 y.o. MRN: [id number]  CC:   HPI Haley Freeman is a 42 y.o. female with a medical history of alcohol abuse, cannabis hyperemesis syndrome, anxiety, depression, headache, and neuromuscular disorder presents to f/u on anemia. Last Hgb 9.9 g/dL with MCV of 70 fL. Iron low at 17 ug/dL. Pt has been taking iron pills as directed. Has been feeling better overall. Not feeling as fatigued. Believes anemia may be contributed to heavy menstrual bleeding. Reports family history of thyroid disease. BMI 46.96. Does not endorse any other symptoms or complaints.      Outpatient Medications Prior to Visit  Medication Sig Dispense Refill  . alum & mag hydroxide-simeth (MAALOX/MYLANTA) 200-200-20 MG/5ML suspension Take 15 mLs by mouth every 4 (four) hours as needed for indigestion or heartburn. 355 mL 0  . ferrous sulfate 325 (65 FE) MG EC tablet TAKE 1 TABLET (325 MG TOTAL) BY MOUTH 3 (THREE) TIMES DAILY WITH MEALS. 270 tablet 1  . hydrOXYzine (ATARAX/VISTARIL) 25 MG tablet Take 50 mg by mouth at bedtime as needed for nausea or vomiting.   2  . metoCLOPramide (REGLAN) 5 MG tablet Take 1 tablet (5 mg total) by mouth 3 (three) times daily before meals. 90 tablet 3  . mirtazapine (REMERON) 45 MG tablet Take 45 mg by mouth at bedtime.   2  . simethicone (GAS-X) 80 MG chewable tablet Chew 1 tablet (80 mg total) by mouth 4 (four) times daily as needed for flatulence. 100 tablet 2  . traZODone (DESYREL) 100 MG tablet Take 300 mg by mouth at bedtime.     . pantoprazole (PROTONIX) 40 MG tablet Take 1 tablet (40 mg total) by mouth 2 (two) times daily. (Patient not taking: Reported on 11/04/2017) 20 tablet 0  . promethazine (PHENERGAN) 25 MG tablet Take 1 tablet (25 mg total) by mouth every 6 (six) hours as needed for nausea or vomiting. (Patient not taking: Reported on 12/02/2017) 20 tablet 0   No facility-administered medications  prior to visit.      ROS Review of Systems  Constitutional: Negative for chills, fever and malaise/fatigue.  Eyes: Negative for blurred vision.  Respiratory: Negative for shortness of breath.   Cardiovascular: Negative for chest pain and palpitations.  Gastrointestinal: Negative for abdominal pain and nausea.  Genitourinary: Negative for dysuria and hematuria.  Musculoskeletal: Negative for joint pain and myalgias.  Skin: Negative for rash.  Neurological: Negative for tingling and headaches.  Psychiatric/Behavioral: Negative for depression. The patient is not nervous/anxious.     Objective:  BP 121/85 (BP Location: Left Arm, Patient Position: Sitting, Cuff Size: Large)   Pulse 99   Temp 98.1 F (36.7 C) (Oral)   Resp 18   Ht 5\' 9"  (1.753 m)   Wt (!) 318 lb (144.2 kg)   LMP 11/26/2017   SpO2 98%   BMI 46.96 kg/m   BP/Weight 12/02/2017 11/04/2017 56/81/2751  Systolic BP 700 174 90  Diastolic BP 85 79 74  Wt. (Lbs) 318 309.5 -  BMI 46.96 47.06 -  Some encounter information is confidential and restricted. Go to Review Flowsheets activity to see all data.      Physical Exam  Constitutional: She is oriented to person, place, and time.  Well developed, obese, NAD, polite  HENT:  Head: Normocephalic and atraumatic.  Eyes: No scleral icterus.  Neck: Normal range of motion. Neck supple. No thyromegaly present.  Cardiovascular: Normal  rate, regular rhythm and normal heart sounds.  Pulmonary/Chest: Effort normal and breath sounds normal.  Musculoskeletal: She exhibits no edema.  Neurological: She is alert and oriented to person, place, and time.  Skin: Skin is warm and dry. No rash noted. No erythema. No pallor.  Psychiatric: She has a normal mood and affect. Her behavior is normal. Thought content normal.  Vitals reviewed.    Assessment & Plan:    1. Anemia, unspecified type - CBC with Differential  2. History of menorrhagia - TSH ordered but Medicaid/Medicare  will not pay. Pt unable to afford $43 [facility] cost of testing. Pt will return at a later date for testing.   3. Class 3 severe obesity without serious comorbidity with body mass index (BMI) of 45.0 to 49.9 in adult, unspecified obesity type ([place]) - TSH ordered but Medicaid/Medicare will not pay. Pt unable to afford $43 [facility] cost of testing. Pt will return at a later date for testing.   4. Family history of thyroid disease - TSH ordered but Medicaid/Medicare will not pay. Pt unable to afford $43 [facility] cost of testing. Pt will return at a later date for testing.   Follow-up: Return in about 8 weeks (around 01/27/2018) for TSH testing.   Clent Demark PA

## 2017-12-02 NOTE — Patient Instructions (Signed)
Thyroid-Stimulating Hormone Test Why am I having this test? A thyroid-stimulating hormone (TSH) test is a blood test that is done to measure the level of TSH, also known as thyrotropin, in your blood. TSH is produced by the pituitary gland. The pituitary gland is a small organ located just below the brain, behind your eyes and nasal passages. It is part of a system that monitors and maintains thyroid hormone levels and thyroid gland function. Thyroid hormones affect many body parts and systems, including the system that affects how quickly your body burns fuel for energy. Your health care provider may recommend testing your TSH level if you have signs and symptoms of abnormal thyroid hormone levels. Knowing the level of TSH in your blood can help your health care provider:  Diagnose a thyroid gland or pituitary gland disorder.  Manage your condition and treatment if you have hypothyroidism or hyperthyroidism.  What kind of sample is taken? A blood sample is required for this test. It is usually collected by inserting a needle into a vein. How do I prepare for this test? There is no preparation required for this test. What are the reference ranges? Reference rangesare considered healthy rangesestablished after testing a large group of healthy people. Reference rangesmay vary among different people, labs, and hospitals. It is your responsibility to obtain your test results. Ask the lab or department performing the test when and how you will get your results. Range of Normal Values:  Adult: 0.3-5 microunits/mL or 0.3-5 milliunits/L (SI units).  Newborn: 52-18 microunits/mL or 3-18 milliunits/L.  Cord: 3-12 microunits/mL or 3-12 milliunits/L.  What do the results mean? A high level of TSH may mean:  Your thyroid gland is not making enough thyroid hormones. When the thyroid gland does not make enough thyroid hormones, the pituitary gland releases TSH into the bloodstream. The  higher-than-normal levels of TSH prompt the thyroid gland to release more thyroid hormones.  You are getting an insufficient level of thyroid hormone medicine, if you are receiving this type of treatment.  There is a problem with the pituitary gland (rare).  A low level of TSH can indicate a problem with the pituitary gland. Talk with your health care provider to discuss your results, treatment options, and if necessary, the need for more tests. Talk with your health care provider if you have any questions about your results. Talk with your health care provider to discuss your results, treatment options, and if necessary, the need for more tests. Talk with your health care provider if you have any questions about your results. This information is not intended to replace advice given to you by your health care provider. Make sure you discuss any questions you have with your health care provider. Document Released: 01/19/2004 Document Revised: 08/27/2015 Document Reviewed: 05/19/2013 Elsevier Interactive Patient Education  [employer].

## 2017-12-03 ENCOUNTER — Telehealth (INDEPENDENT_AMBULATORY_CARE_PROVIDER_SITE_OTHER): Payer: Self-pay

## 2017-12-03 LAB — CBC WITH DIFFERENTIAL/PLATELET
BASOS ABS: 0.1 10*3/uL (ref 0.0–0.2)
BASOS: 1 %
EOS (ABSOLUTE): 0.5 10*3/uL — AB (ref 0.0–0.4)
Eos: 3 %
HEMOGLOBIN: 10 g/dL — AB (ref 11.1–15.9)
Hematocrit: 32.7 % — ABNORMAL LOW (ref 34.0–46.6)
Immature Grans (Abs): 0.1 10*3/uL (ref 0.0–0.1)
Immature Granulocytes: 1 %
LYMPHS ABS: 5 10*3/uL — AB (ref 0.7–3.1)
LYMPHS: 25 %
MCH: 20.7 pg — AB (ref 26.6–33.0)
MCHC: 30.6 g/dL — AB (ref 31.5–35.7)
MCV: 68 fL — ABNORMAL LOW (ref 79–97)
Monocytes Absolute: 0.7 10*3/uL (ref 0.1–0.9)
Monocytes: 4 %
NEUTROS ABS: 13.4 10*3/uL — AB (ref 1.4–7.0)
Neutrophils: 66 %
PLATELETS: 404 10*3/uL (ref 150–450)
RBC: 4.82 x10E6/uL (ref 3.77–5.28)
RDW: 18.8 % — ABNORMAL HIGH (ref 12.3–15.4)
WBC: 19.8 10*3/uL — ABNORMAL HIGH (ref 3.4–10.8)

## 2017-12-03 NOTE — Telephone Encounter (Signed)
Left voicemail notifying patient that she has chronically elevated white blood cells. Anemia continues. Needs work up with additional blood tests. Asked patient to return call to [facility] to be scheduled in the next 4-5 weeks to see PCP. That should be scheduled between Jan 1- Jan 3. Will call patient again to try and schedule. Nat Christen, CMA

## 2017-12-03 NOTE — Telephone Encounter (Signed)
-----   Message from Clent Demark, PA-C sent at 12/03/2017  8:28 AM EST ----- Pt has chronically elevated white blood cells. Anemia continues. Pt will need a work up with more blood testing. Please schedule for OV within 4 to 5 weeks to see me.

## 2017-12-04 ENCOUNTER — Other Ambulatory Visit (INDEPENDENT_AMBULATORY_CARE_PROVIDER_SITE_OTHER): Payer: Self-pay | Admitting: Physician Assistant

## 2017-12-04 DIAGNOSIS — D649 Anemia, unspecified: Secondary | ICD-10-CM

## 2017-12-08 NOTE — Telephone Encounter (Signed)
FWD to PCP. Aahna Rossa S Kris No, CMA  

## 2017-12-25 ENCOUNTER — Encounter (INDEPENDENT_AMBULATORY_CARE_PROVIDER_SITE_OTHER): Payer: Self-pay | Admitting: Physician Assistant

## 2017-12-25 ENCOUNTER — Other Ambulatory Visit: Payer: Self-pay

## 2017-12-25 ENCOUNTER — Ambulatory Visit (INDEPENDENT_AMBULATORY_CARE_PROVIDER_SITE_OTHER): Payer: Medicare Other | Admitting: Physician Assistant

## 2017-12-25 VITALS — BP 108/74 | HR 81 | Temp 98.2°F | Ht 69.0 in | Wt 319.6 lb

## 2017-12-25 DIAGNOSIS — D649 Anemia, unspecified: Secondary | ICD-10-CM | POA: Diagnosis not present

## 2017-12-25 DIAGNOSIS — R5382 Chronic fatigue, unspecified: Secondary | ICD-10-CM

## 2017-12-25 NOTE — Progress Notes (Signed)
   Subjective:  Patient ID: Haley Freeman, female    DOB: [birth year]  Age: 42 y.o. MRN: [id number]  CC: fatigue  HPI Zowie C Riches-Eppsis a 42 y.o.femalewith a medical history of alcohol abuse, cannabis hyperemesis syndrome, anxiety, depression, headache, and neuromuscular disorder presents with fatigue. Had CBC done three weeks ago and was found to have a Hgb of 10.0 g/dL. Has finished taking her iron pills and is going to pick up her refill soon. Still complains of fatigue. Says her whole parents and siblings all suffer from anemia. Denies heavy menstrual bleeding, hemoptysis, hematuria, or hematochezia.      Outpatient Medications Prior to Visit  Medication Sig Dispense Refill  . hydrOXYzine (ATARAX/VISTARIL) 25 MG tablet Take 50 mg by mouth at bedtime as needed for nausea or vomiting.   2  . metoCLOPramide (REGLAN) 5 MG tablet Take 1 tablet (5 mg total) by mouth 3 (three) times daily before meals. 90 tablet 3  . mirtazapine (REMERON) 45 MG tablet Take 45 mg by mouth at bedtime.   2  . simethicone (GAS-X) 80 MG chewable tablet Chew 1 tablet (80 mg total) by mouth 4 (four) times daily as needed for flatulence. 100 tablet 2  . traZODone (DESYREL) 100 MG tablet Take 300 mg by mouth at bedtime.     . ferrous sulfate 325 (65 FE) MG EC tablet TAKE 1 TABLET (325 MG TOTAL) BY MOUTH 3 (THREE) TIMES DAILY WITH MEALS. (Patient not taking: Reported on 12/25/2017) 270 tablet 1  . alum & mag hydroxide-simeth (MAALOX/MYLANTA) 200-200-20 MG/5ML suspension Take 15 mLs by mouth every 4 (four) hours as needed for indigestion or heartburn. 355 mL 0   No facility-administered medications prior to visit.      ROS ROS  Objective:  BP 108/74 (BP Location: Left Arm, Patient Position: Sitting, Cuff Size: Large)   Pulse 81   Temp 98.2 F (36.8 C) (Oral)   Ht 5\' 9"  (1.753 m)   Wt (!) 319 lb 9.6 oz (145 kg)   LMP 12/22/2017 (Exact Date)   SpO2 97%   BMI 47.20 kg/m   BP/Weight 12/25/2017  12/02/2017 82/64/1583  Systolic BP 094 076 808  Diastolic BP 74 85 79  Wt. (Lbs) 319.6 318 309.5  BMI 47.2 46.96 47.06  Some encounter information is confidential and restricted. Go to Review Flowsheets activity to see all data.      Physical Exam   Assessment & Plan:    1. Anemia, unspecified type - TSH; Future - CBC with Differential; Future - Sickle cell screen; Future - Hemoglobinopathy evaluation; Future  2. Chronic fatigue - TSH; Future - CBC with Differential; Future - Sickle cell screen; Future - Hemoglobinopathy evaluation; Future   Follow-up: 8 weeks for anemia, possible further work up   [employer] PA

## 2017-12-25 NOTE — Patient Instructions (Signed)

## 2017-12-30 LAB — HEMOGLOBINOPATHY EVALUATION
HGB C: 0 %
HGB S: 0 %
HGB VARIANT: 0 %
Hemoglobin A2 Quantitation: 2.3 % (ref 1.8–3.2)
Hemoglobin F Quantitation: 0 % (ref 0.0–2.0)
Hgb A: 97.7 % (ref 96.4–98.8)

## 2017-12-30 LAB — SICKLE CELL SCREEN: SICKLE CELL SCREEN: NEGATIVE

## 2017-12-30 LAB — CBC WITH DIFFERENTIAL/PLATELET
Basophils Absolute: 0.1 10*3/uL (ref 0.0–0.2)
Basos: 1 %
EOS (ABSOLUTE): 0.5 10*3/uL — AB (ref 0.0–0.4)
Eos: 4 %
Hematocrit: 37 % (ref 34.0–46.6)
Hemoglobin: 11.1 g/dL (ref 11.1–15.9)
IMMATURE GRANS (ABS): 0 10*3/uL (ref 0.0–0.1)
IMMATURE GRANULOCYTES: 0 %
LYMPHS: 32 %
Lymphocytes Absolute: 4 10*3/uL — ABNORMAL HIGH (ref 0.7–3.1)
MCH: 22 pg — AB (ref 26.6–33.0)
MCHC: 30 g/dL — ABNORMAL LOW (ref 31.5–35.7)
MCV: 73 fL — ABNORMAL LOW (ref 79–97)
Monocytes Absolute: 0.6 10*3/uL (ref 0.1–0.9)
Monocytes: 5 %
NEUTROS PCT: 58 %
Neutrophils Absolute: 7.3 10*3/uL — ABNORMAL HIGH (ref 1.4–7.0)
Platelets: 325 10*3/uL (ref 150–450)
RBC: 5.05 x10E6/uL (ref 3.77–5.28)
RDW: 20 % — AB (ref 12.3–15.4)
WBC: 12.5 10*3/uL — ABNORMAL HIGH (ref 3.4–10.8)

## 2017-12-30 LAB — TSH: TSH: 1.31 u[IU]/mL (ref 0.450–4.500)

## 2018-01-01 ENCOUNTER — Telehealth (INDEPENDENT_AMBULATORY_CARE_PROVIDER_SITE_OTHER): Payer: Self-pay

## 2018-01-01 NOTE — Telephone Encounter (Signed)
-----   Message from Clent Demark, PA-C sent at 01/01/2018  9:05 AM EST ----- Not anemic. No sickle cell disease. Thyroid normal. I advise further work up at next visit and to continue taking iron pills for now.

## 2018-01-01 NOTE — Telephone Encounter (Signed)
Patient is aware of no anemia, no sickle cell disease, thyroid normal. Continue taking iron pills and have further work up at next office. Nat Christen, CMA

## 2018-01-20 DIAGNOSIS — F319 Bipolar disorder, unspecified: Secondary | ICD-10-CM | POA: Diagnosis not present

## 2018-01-20 DIAGNOSIS — F4310 Post-traumatic stress disorder, unspecified: Secondary | ICD-10-CM | POA: Diagnosis not present

## 2018-01-27 ENCOUNTER — Ambulatory Visit (INDEPENDENT_AMBULATORY_CARE_PROVIDER_SITE_OTHER): Payer: Medicare Other | Admitting: Physician Assistant

## 2018-02-10 ENCOUNTER — Encounter (HOSPITAL_COMMUNITY): Payer: Self-pay

## 2018-02-10 ENCOUNTER — Ambulatory Visit (INDEPENDENT_AMBULATORY_CARE_PROVIDER_SITE_OTHER): Payer: Medicare Other

## 2018-02-10 ENCOUNTER — Ambulatory Visit (HOSPITAL_COMMUNITY)
Admission: EM | Admit: 2018-02-10 | Discharge: 2018-02-10 | Disposition: A | Discharge status: Home / Self Care | Payer: Medicare Other | Attending: Family Medicine | Admitting: Family Medicine

## 2018-02-10 DIAGNOSIS — S86911A Strain of unspecified muscle(s) and tendon(s) at lower leg level, right leg, initial encounter: Secondary | ICD-10-CM | POA: Diagnosis not present

## 2018-02-10 DIAGNOSIS — M179 Osteoarthritis of knee, unspecified: Secondary | ICD-10-CM | POA: Diagnosis not present

## 2018-02-10 DIAGNOSIS — M25561 Pain in right knee: Secondary | ICD-10-CM

## 2018-02-10 DIAGNOSIS — S8991XA Unspecified injury of right lower leg, initial encounter: Secondary | ICD-10-CM | POA: Diagnosis not present

## 2018-02-10 DIAGNOSIS — W010XXA Fall on same level from slipping, tripping and stumbling without subsequent striking against object, initial encounter: Secondary | ICD-10-CM

## 2018-02-10 MED ORDER — METHYLPREDNISOLONE 4 MG PO TBPK: ORAL_TABLET | ORAL | 0 refills | Status: DC

## 2018-02-10 NOTE — ED Provider Notes (Signed)
[place]    CSN: [id number] Arrival date & time: 02/10/18  0756     History   Chief Complaint Chief Complaint  Patient presents with  . Knee Pain    HPI Haley Freeman is a 43 y.o. female.   HPI  Patient is here for knee pain.  She states that she slipped on water in her kitchen floor twisted her knee and fell.  Been very painful ever since and heart heart rate on.  Right knee.  She states she has had an injury to this knee before but is unaware that she has any underlying arthritis or knee problems.  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  She states the knee is swollen and hot.  No buckling or locking.  No instability.  Past Medical History:  Diagnosis Date  . Alcohol abuse   . Anxiety   . Depression   . Headache(784.0)   . Neuromuscular disorder [facility])     Patient Active Problem List   Diagnosis Date Noted  . Cyclic vomiting syndrome 11/01/2017  . Epigastric pain   . Nausea & vomiting 10/24/2017  . Hypokalemia 09/27/2017  . Elevated LFTs 09/27/2017  . Abnormal biliary HIDA scan 09/27/2017  . Marijuana abuse 09/27/2017  . Intractable nausea and vomiting 09/26/2017  . Cannabis hyperemesis syndrome concurrent with and due to cannabis abuse ([place]) 09/16/2017  . Nausea and vomiting 09/06/2017  . Dermoid 08/22/2017    Past Surgical History:  Procedure Laterality Date  . CESAREAN SECTION  '99 and '08   x 2  . CHOLECYSTECTOMY N/A 10/01/2017   Procedure: LAPAROSCOPIC CHOLECYSTECTOMY WITH POSSIBLE  INTRAOPERATIVE CHOLANGIOGRAM;  Surgeon: Stark Klein, MD;  Location: [facility];  Service: General;  Laterality: N/A;  . ESOPHAGOGASTRODUODENOSCOPY N/A 09/16/2017   Procedure: ESOPHAGOGASTRODUODENOSCOPY (EGD) WITH PROPOFOL;  Surgeon: Clarene Essex, MD;  Location: [place];  Service: Endoscopy;  Laterality: N/A;  . LAPAROSCOPY Right 08/22/2017   Procedure: LAPAROSCOPY OPERATIVE removal of right ovary and right dermoid cyst;  Surgeon: Sloan Leiter, MD;  Location: [facility];  Service: Gynecology;  Laterality: Right;    OB History    Gravida  3   Para  3   Term      Preterm      AB      Living  3     SAB      TAB      Ectopic      Multiple      Live Births               Home Medications    Prior to Admission medications   Medication Sig Start Date End Date Taking? Authorizing Provider  hydrOXYzine (ATARAX/VISTARIL) 25 MG tablet Take 50 mg by mouth at bedtime as needed for nausea or vomiting.  09/20/17   [provider]  methylPREDNISolone (MEDROL DOSEPAK) 4 MG TBPK tablet tad 02/10/18   Raylene Everts, MD  metoCLOPramide (REGLAN) 5 MG tablet Take 1 tablet (5 mg total) by mouth 3 (three) times daily before meals. 10/30/17   Charlynne Cousins, MD  mirtazapine (REMERON) 45 MG tablet Take 45 mg by mouth at bedtime.  10/16/17   [provider]  simethicone (GAS-X) 80 MG chewable tablet Chew 1 tablet (80 mg total) by mouth 4 (four) times daily as needed for flatulence. 10/30/17 10/30/18  Charlynne Cousins, MD  traZODone (DESYREL) 100 MG tablet Take 300 mg by mouth at  bedtime.     [provider]    Family History Family History  Family history unknown: Yes    Social History Social History   Tobacco Use  . Smoking status: Former Smoker    Packs/day: 0.50  . Smokeless tobacco: Never Used  Substance Use Topics  . Alcohol use: No  . Drug use: Yes    Types: Other-see comments, Marijuana    Comment: Occasionally smokes marijuana Reports last  time 09/11/17 but at party on 09/13/17     Allergies   Penicillins and Strawberry (diagnostic)   Review of Systems Review of Systems  Constitutional: Negative for chills and fever.  HENT: Negative for ear pain and sore throat.   Eyes: Negative for pain and visual disturbance.  Respiratory: Negative for cough and shortness of breath.   Cardiovascular: Negative for chest pain and palpitations.  Gastrointestinal: Negative for  abdominal pain and vomiting.  Genitourinary: Negative for dysuria and hematuria.  Musculoskeletal: Positive for arthralgias and gait problem. Negative for back pain.  Skin: Negative for color change and rash.  Neurological: Negative for seizures and syncope.  All other systems reviewed and are negative.    Physical Exam Triage Vital Signs ED Triage Vitals  Enc Vitals Group     BP 02/10/18 0818 117/73     Pulse Rate 02/10/18 0818 90     Resp 02/10/18 0818 18     Temp 02/10/18 0818 98.4 F (36.9 C)     Temp Source 02/10/18 0818 Oral     SpO2 02/10/18 0818 96 %     Weight --      Height --      Head Circumference --      Peak Flow --      Pain Score 02/10/18 0820 9     Pain Loc --      Pain Edu? --      Excl. in [place]? --    No data found.  Updated Vital Signs BP 117/73 (BP Location: Right Arm)   Pulse 90   Temp 98.4 F (36.9 C) (Oral)   Resp 18   LMP 01/18/2018   SpO2 96%   Visual Acuity Right Eye Distance:   Left Eye Distance:   Bilateral Distance:    Right Eye Near:   Left Eye Near:    Bilateral Near:     Physical Exam Constitutional:      General: She is not in acute distress.    Appearance: She is well-developed. She is obese.  HENT:     Head: Normocephalic and atraumatic.  Eyes:     Conjunctiva/sclera: Conjunctivae normal.     Pupils: Pupils are equal, round, and reactive to light.  Neck:     Musculoskeletal: Normal range of motion.  Cardiovascular:     Rate and Rhythm: Normal rate.  Pulmonary:     Effort: Pulmonary effort is normal. No respiratory distress.  Abdominal:     General: There is no distension.     Palpations: Abdomen is soft.  Musculoskeletal: Normal range of motion.        General: Swelling and tenderness present.     Comments: Patient has not antalgic gait.  Slight valgus deformities to the knee.  Right knee has effusion and warmth.  Very limited range of motion, she can flex to 90 degrees and lacks full extension.  Tenderness in the  medial and lateral joint line.  No tenderness posteriorly.  No instability to examination.  Skin:  General: Skin is warm and dry.  Neurological:     Mental Status: She is alert.      UC Treatments / Results  Labs (all labs ordered are listed, but only abnormal results are displayed) Labs Reviewed - No data to display  EKG None  Radiology Dg Knee Ap/lat W/sunrise Right  Result Date: 02/10/2018 CLINICAL DATA:  Slip and fall several days ago with right knee pain, initial encounter EXAM: RIGHT KNEE 3 VIEWS COMPARISON:  12/30/2015 FINDINGS: Mild medial joint space narrowing is noted with osteophytic changes. Mild patellofemoral degenerative changes are noted as well. No joint effusion is noted. No other soft tissue abnormality is seen. No fracture is noted. IMPRESSION: Mild degenerative change slightly progressed when compared with the prior exam. No acute abnormality noted. Electronically Signed   By: Inez Catalina M.D.   On: 02/10/2018 09:14    Procedures Procedures (including critical care time)  Medications Ordered in UC Medications - No data to display  Initial Impression / Assessment and Plan / UC Course  I have reviewed the triage vital signs and the nursing notes.  Pertinent labs & imaging results that were available during my care of the patient were reviewed by me and considered in my medical decision making (see chart for details).    I showed the patient her x-rays and we discussed the fact that she does have underlying osteoarthritis.  With this recent injury, her knee is inflamed possibly from the trauma, possibly from torn cartilage.  It is impossible to tell from x-rays and physical examination.  I recommend conservative care, and time.  If she fails to improve she may need to see An orthopedist Final Clinical Impressions(s) / UC Diagnoses   Final diagnoses:  Strain of right knee, initial encounter  Acute pain of right knee     Discharge Instructions      Ice Rest Wear brace while up Take the medrol as directed Take all of day one today See y our PCP if you fail to improve in a week   ED Prescriptions    Medication Sig Dispense Auth. Provider   methylPREDNISolone (MEDROL DOSEPAK) 4 MG TBPK tablet tad 21 tablet Raylene Everts, MD     Controlled Substance Prescriptions [place] Controlled Substance Registry consulted? Not Applicable   Raylene Everts, MD 02/10/18 1249

## 2018-02-10 NOTE — Discharge Instructions (Addendum)
Ice Rest Wear brace while up Take the medrol as directed Take all of day one today See y our PCP if you fail to improve in a week

## 2018-02-10 NOTE — ED Triage Notes (Signed)
Pt presents with right knee pain from fall injury on Sunday at home.  Pt states she slipped on water and slammed her knee into her deep freezer.

## 2018-02-19 ENCOUNTER — Ambulatory Visit (INDEPENDENT_AMBULATORY_CARE_PROVIDER_SITE_OTHER): Payer: Medicare Other | Admitting: Primary Care

## 2018-03-08 ENCOUNTER — Ambulatory Visit (HOSPITAL_COMMUNITY)
Admission: EM | Admit: 2018-03-08 | Discharge: 2018-03-08 | Disposition: A | Discharge status: Home / Self Care | Payer: Medicare Other | Attending: Family Medicine | Admitting: Family Medicine

## 2018-03-08 ENCOUNTER — Encounter (HOSPITAL_COMMUNITY): Payer: Self-pay | Admitting: *Deleted

## 2018-03-08 DIAGNOSIS — M25562 Pain in left knee: Secondary | ICD-10-CM

## 2018-03-08 MED ORDER — METHYLPREDNISOLONE 4 MG PO TBPK: ORAL_TABLET | ORAL | 0 refills | Status: DC

## 2018-03-08 NOTE — ED Triage Notes (Signed)
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  Ambulates with limp.

## 2018-03-08 NOTE — ED Provider Notes (Signed)
[place]    CSN: [id number] Arrival date & time: 03/08/18  1001     History   Chief Complaint Chief Complaint  Patient presents with  . Knee Pain    HPI Haley Freeman is a 43 y.o. female.   HPI  She is here for pain in the left knee.  She did not have any injury.  She states that she did some volunteering on 02/28/2018 and was on her feet for longer period of time than usual.  The next day she woke up with some swelling and pain in her knee.  It is warm.  Does have some known arthritis.  She did not have any trauma. At her last visit she was seen for right knee pain.  She did very well with a Dosepak and feels back to normal  Past Medical History:  Diagnosis Date  . Alcohol abuse   . Anxiety   . Depression   . Headache(784.0)   . Neuromuscular disorder [facility])     Patient Active Problem List   Diagnosis Date Noted  . Cyclic vomiting syndrome 11/01/2017  . Epigastric pain   . Nausea & vomiting 10/24/2017  . Hypokalemia 09/27/2017  . Elevated LFTs 09/27/2017  . Abnormal biliary HIDA scan 09/27/2017  . Marijuana abuse 09/27/2017  . Intractable nausea and vomiting 09/26/2017  . Cannabis hyperemesis syndrome concurrent with and due to cannabis abuse ([place]) 09/16/2017  . Nausea and vomiting 09/06/2017  . Dermoid 08/22/2017    Past Surgical History:  Procedure Laterality Date  . CESAREAN SECTION  '99 and '08   x 2  . CHOLECYSTECTOMY N/A 10/01/2017   Procedure: LAPAROSCOPIC CHOLECYSTECTOMY WITH POSSIBLE  INTRAOPERATIVE CHOLANGIOGRAM;  Surgeon: Stark Klein, MD;  Location: [facility];  Service: General;  Laterality: N/A;  . ESOPHAGOGASTRODUODENOSCOPY N/A 09/16/2017   Procedure: ESOPHAGOGASTRODUODENOSCOPY (EGD) WITH PROPOFOL;  Surgeon: Clarene Essex, MD;  Location: [place];  Service: Endoscopy;  Laterality: N/A;  . LAPAROSCOPY Right 08/22/2017   Procedure: LAPAROSCOPY OPERATIVE removal of right ovary and right dermoid cyst;  Surgeon: Sloan Leiter, MD;   Location: [place] [facility];  Service: Gynecology;  Laterality: Right;    OB History    Gravida  3   Para  3   Term      Preterm      AB      Living  3     SAB      TAB      Ectopic      Multiple      Live Births               Home Medications    Prior to Admission medications   Medication Sig Start Date End Date Taking? Authorizing Provider  hydrOXYzine (ATARAX/VISTARIL) 25 MG tablet Take 50 mg by mouth at bedtime as needed for nausea or vomiting.  09/20/17  Yes [provider]  metoCLOPramide (REGLAN) 5 MG tablet Take 1 tablet (5 mg total) by mouth 3 (three) times daily before meals. 10/30/17  Yes Charlynne Cousins, MD  mirtazapine (REMERON) 45 MG tablet Take 45 mg by mouth at bedtime.  10/16/17  Yes [provider]  traZODone (DESYREL) 100 MG tablet Take 300 mg by mouth at bedtime.    Yes [provider]  methylPREDNISolone (MEDROL DOSEPAK) 4 MG TBPK tablet tad 03/08/18   Raylene Everts, MD  simethicone (GAS-X) 80 MG chewable tablet Chew 1 tablet (80 mg total) by mouth 4 (four) times daily  as needed for flatulence. 10/30/17 10/30/18  Charlynne Cousins, MD    Family History Family History  Problem Relation Age of Onset  . Hypertension Mother     Social History Social History   Tobacco Use  . Smoking status: Former Smoker    Packs/day: 0.50  . Smokeless tobacco: Never Used  Substance Use Topics  . Alcohol use: No  . Drug use: Yes    Types: Other-see comments, Marijuana    Comment: Occasionally smokes marijuana      Allergies   Penicillins and Strawberry (diagnostic)   Review of Systems Review of Systems  Constitutional: Negative for chills and fever.  HENT: Negative for ear pain and sore throat.   Eyes: Negative for pain and visual disturbance.  Respiratory: Negative for cough and shortness of breath.   Cardiovascular: Negative for chest pain and palpitations.  Gastrointestinal: Negative for abdominal pain and  vomiting.  Genitourinary: Negative for dysuria and hematuria.  Musculoskeletal: Positive for arthralgias and gait problem. Negative for back pain.  Skin: Negative for color change and rash.  Neurological: Negative for seizures and syncope.  All other systems reviewed and are negative.    Physical Exam Triage Vital Signs ED Triage Vitals  Enc Vitals Group     BP 03/08/18 1013 115/67     Pulse Rate 03/08/18 1013 98     Resp 03/08/18 1013 16     Temp 03/08/18 1013 98.3 F (36.8 C)     Temp Source 03/08/18 1013 Oral     SpO2 03/08/18 1013 98 %     Weight --      Height --      Head Circumference --      Peak Flow --      Pain Score 03/08/18 1014 7     Pain Loc --      Pain Edu? --      Excl. in [place]? --    No data found.  Updated Vital Signs BP 115/67   Pulse 98   Temp 98.3 F (36.8 C) (Oral)   Resp 16   LMP 02/13/2018 (Approximate)   SpO2 98%   Visual Acuity Right Eye Distance:   Left Eye Distance:   Bilateral Distance:    Right Eye Near:   Left Eye Near:    Bilateral Near:     Physical Exam Constitutional:      General: She is not in acute distress.    Appearance: She is well-developed. She is obese.  HENT:     Head: Normocephalic and atraumatic.  Eyes:     Conjunctiva/sclera: Conjunctivae normal.     Pupils: Pupils are equal, round, and reactive to light.  Neck:     Musculoskeletal: Normal range of motion.  Cardiovascular:     Rate and Rhythm: Normal rate.  Pulmonary:     Effort: Pulmonary effort is normal. No respiratory distress.  Abdominal:     General: There is no distension.     Palpations: Abdomen is soft.  Musculoskeletal: Normal range of motion.     Comments: Left knee has warmth.  Crepitus with range of motion.  Pain with patellofemoral grind testing.  Full range of motion.  No instability  Skin:    General: Skin is warm and dry.  Neurological:     Mental Status: She is alert.      UC Treatments / Results  Labs (all labs ordered are  listed, but only abnormal results are displayed) Labs Reviewed - No  data to display  EKG None  Radiology No results found.  Procedures Procedures (including critical care time)  Medications Ordered in UC Medications - No data to display  Initial Impression / Assessment and Plan / UC Course  I have reviewed the triage vital signs and the nursing notes.  Pertinent labs & imaging results that were available during my care of the patient were reviewed by me and considered in my medical decision making (see chart for details).      Final Clinical Impressions(s) / UC Diagnoses   Final diagnoses:  None     Discharge Instructions     Limit walking while knee is painful Use ice for 20 minutes every couple of hours Take the Medrol Dosepak as prescribed Take all of day 1 today     ED Prescriptions    Medication Sig Dispense Auth. Provider   methylPREDNISolone (MEDROL DOSEPAK) 4 MG TBPK tablet tad 21 tablet Raylene Everts, MD     Controlled Substance Prescriptions [place] Controlled Substance Registry consulted? Not Applicable   Raylene Everts, MD 03/08/18 1131

## 2018-03-08 NOTE — Discharge Instructions (Signed)
Limit walking while knee is painful Use ice for 20 minutes every couple of hours Take the Medrol Dosepak as prescribed Take all of day 1 today

## 2018-03-30 ENCOUNTER — Ambulatory Visit (INDEPENDENT_AMBULATORY_CARE_PROVIDER_SITE_OTHER): Payer: Medicare Other | Admitting: Primary Care

## 2018-04-24 ENCOUNTER — Other Ambulatory Visit: Payer: Self-pay

## 2018-04-24 ENCOUNTER — Encounter (HOSPITAL_COMMUNITY): Payer: Self-pay | Admitting: Emergency Medicine

## 2018-04-24 ENCOUNTER — Ambulatory Visit (INDEPENDENT_AMBULATORY_CARE_PROVIDER_SITE_OTHER)
Admission: EM | Admit: 2018-04-24 | Discharge: 2018-04-24 | Disposition: A | Discharge status: Home / Self Care | Payer: Medicare Other | Source: Home / Self Care | Attending: Family Medicine | Admitting: Family Medicine

## 2018-04-24 ENCOUNTER — Encounter (HOSPITAL_COMMUNITY): Payer: Self-pay

## 2018-04-24 ENCOUNTER — Telehealth (HOSPITAL_COMMUNITY): Payer: Self-pay | Admitting: Emergency Medicine

## 2018-04-24 ENCOUNTER — Emergency Department (HOSPITAL_COMMUNITY)
Admission: EM | Admit: 2018-04-24 | Discharge: 2018-04-24 | Disposition: A | Discharge status: Home / Self Care | Payer: Medicare Other | Attending: Emergency Medicine | Admitting: Emergency Medicine

## 2018-04-24 DIAGNOSIS — Z79899 Other long term (current) drug therapy: Secondary | ICD-10-CM | POA: Diagnosis not present

## 2018-04-24 DIAGNOSIS — R1110 Vomiting, unspecified: Secondary | ICD-10-CM | POA: Diagnosis not present

## 2018-04-24 DIAGNOSIS — Z87891 Personal history of nicotine dependence: Secondary | ICD-10-CM | POA: Insufficient documentation

## 2018-04-24 DIAGNOSIS — K3184 Gastroparesis: Secondary | ICD-10-CM | POA: Insufficient documentation

## 2018-04-24 DIAGNOSIS — R112 Nausea with vomiting, unspecified: Secondary | ICD-10-CM

## 2018-04-24 DIAGNOSIS — R109 Unspecified abdominal pain: Secondary | ICD-10-CM | POA: Diagnosis present

## 2018-04-24 DIAGNOSIS — R197 Diarrhea, unspecified: Secondary | ICD-10-CM | POA: Insufficient documentation

## 2018-04-24 DIAGNOSIS — R1115 Cyclical vomiting syndrome unrelated to migraine: Secondary | ICD-10-CM | POA: Diagnosis not present

## 2018-04-24 LAB — LIPASE, BLOOD: Lipase: 26 U/L (ref 11–51)

## 2018-04-24 LAB — CBC
HCT: 40.6 % (ref 36.0–46.0)
Hemoglobin: 12 g/dL (ref 12.0–15.0)
MCH: 22.1 pg — ABNORMAL LOW (ref 26.0–34.0)
MCHC: 29.6 g/dL — ABNORMAL LOW (ref 30.0–36.0)
MCV: 74.6 fL — ABNORMAL LOW (ref 80.0–100.0)
Platelets: 325 10*3/uL (ref 150–400)
RBC: 5.44 MIL/uL — ABNORMAL HIGH (ref 3.87–5.11)
RDW: 18.6 % — ABNORMAL HIGH (ref 11.5–15.5)
WBC: 16.9 10*3/uL — ABNORMAL HIGH (ref 4.0–10.5)

## 2018-04-24 LAB — COMPREHENSIVE METABOLIC PANEL
ALT: 13 U/L (ref 0–44)
AST: 22 U/L (ref 15–41)
Albumin: 3.7 g/dL (ref 3.5–5.0)
Alkaline Phosphatase: 123 U/L (ref 38–126)
Anion gap: 13 (ref 5–15)
BUN: 13 mg/dL (ref 6–20)
CO2: 20 mmol/L — ABNORMAL LOW (ref 22–32)
Calcium: 9.7 mg/dL (ref 8.9–10.3)
Chloride: 106 mmol/L (ref 98–111)
Creatinine, Ser: 1.08 mg/dL — ABNORMAL HIGH (ref 0.44–1.00)
GFR calc Af Amer: >60 mL/min (ref >60)
GFR calc non Af Amer: >60 mL/min (ref >60)
Glucose, Bld: 120 mg/dL — ABNORMAL HIGH (ref 70–99)
Potassium: 4 mmol/L (ref 3.5–5.1)
Sodium: 139 mmol/L (ref 135–145)
Total Bilirubin: 0.6 mg/dL (ref 0.3–1.2)
Total Protein: 7.9 g/dL (ref 6.5–8.1)

## 2018-04-24 LAB — I-STAT BETA HCG BLOOD, ED (MC, WL, AP ONLY): I-stat hCG, quantitative: <5 m[IU]/mL (ref <5)

## 2018-04-24 MED ORDER — PROMETHAZINE HCL 25 MG/ML IJ SOLN
25.0000 mg | Freq: Once | INTRAMUSCULAR | Status: AC
  Administered 2018-04-24: 25 mg via INTRAVENOUS
  Filled 2018-04-24: qty 1

## 2018-04-24 MED ORDER — ONDANSETRON HCL 4 MG/2ML IJ SOLN
4.0000 mg | Freq: Once | INTRAMUSCULAR | Status: AC
  Administered 2018-04-24: 4 mg via INTRAVENOUS

## 2018-04-24 MED ORDER — HYDROMORPHONE HCL 1 MG/ML IJ SOLN
1.0000 mg | Freq: Once | INTRAMUSCULAR | Status: AC
  Administered 2018-04-24: 1 mg via INTRAVENOUS
  Filled 2018-04-24: qty 1

## 2018-04-24 MED ORDER — PROMETHAZINE HCL 25 MG PO TABS: 25.0000 mg | ORAL_TABLET | Freq: Four times a day (QID) | ORAL | 0 refills | Status: DC | PRN

## 2018-04-24 MED ORDER — METOCLOPRAMIDE HCL 5 MG/ML IJ SOLN
10.0000 mg | Freq: Once | INTRAMUSCULAR | Status: AC
  Administered 2018-04-24: 10 mg via INTRAMUSCULAR

## 2018-04-24 MED ORDER — SODIUM CHLORIDE 0.9 % IV BOLUS
1000.0000 mL | Freq: Once | INTRAVENOUS | Status: AC
  Administered 2018-04-24: 1000 mL via INTRAVENOUS

## 2018-04-24 MED ORDER — LACTATED RINGERS IV BOLUS
1000.0000 mL | Freq: Once | INTRAVENOUS | Status: AC
  Administered 2018-04-24: 1000 mL via INTRAVENOUS

## 2018-04-24 MED ORDER — HALOPERIDOL LACTATE 5 MG/ML IJ SOLN
5.0000 mg | Freq: Once | INTRAMUSCULAR | Status: AC
  Administered 2018-04-24: 5 mg via INTRAVENOUS
  Filled 2018-04-24: qty 1

## 2018-04-24 MED ORDER — METOCLOPRAMIDE HCL 5 MG/ML IJ SOLN
INTRAMUSCULAR | Status: AC
  Filled 2018-04-24: qty 2

## 2018-04-24 MED ORDER — ONDANSETRON HCL 4 MG/2ML IJ SOLN
INTRAMUSCULAR | Status: AC
  Filled 2018-04-24: qty 2

## 2018-04-24 MED ORDER — PANTOPRAZOLE SODIUM 40 MG PO TBEC: 40.0000 mg | DELAYED_RELEASE_TABLET | Freq: Every day | ORAL | 1 refills | Status: DC

## 2018-04-24 NOTE — ED Triage Notes (Signed)
Patient presents to Urgent Care with complaints of vomiting and residual nausea and abdominal pain afterwards since yesterday. Patient states she has had 5 episodes of vomiting today, hx of gastroparesis.

## 2018-04-24 NOTE — ED Triage Notes (Signed)
Pt c/o abd pains with n/v/d that started yesterday. Reports no diarrhea today.  Was seen at UC earlier and was taken to [facility] ED for further treatment. Pt states that she had to long of a wait at [facility] ED so left.

## 2018-04-24 NOTE — ED Notes (Signed)
Patient ambulated to bathroom w/ a steady gate.

## 2018-04-24 NOTE — ED Notes (Signed)
Taking patient to ER per [facility]

## 2018-04-24 NOTE — ED Provider Notes (Signed)
[facility] DEPT Provider Note   CSN: [id number] Arrival date & time: 04/24/18  1632    History   Chief Complaint Chief Complaint  Patient presents with  . Abdominal Pain  . Emesis  . Diarrhea    HPI Haley Freeman is a 43 y.o. female.     HPI  42 year old female presents with abdominal pain and vomiting.  This is happened to her many times before.  She has been diagnosed with gastroparesis but also has a diagnosis of cyclic vomiting syndrome.  Patient states she uses marijuana intermittently, last used it 5 days ago.  She has not had any hematemesis.  She had 3 episodes of diarrhea yesterday but none today.  No blood in her stools.  She denies any fevers.  The pain in her abdomen is right upper quadrant and left upper quadrant which is typical.  She went to urgent care where she was given a small bolus of fluids as well as 10 mg IV Reglan and 4 mg IV Zofran.  Still having vomiting.  Past Medical History:  Diagnosis Date  . Alcohol abuse   . Anxiety   . Depression   . Headache(784.0)   . Neuromuscular disorder [facility])     Patient Active Problem List   Diagnosis Date Noted  . Cyclic vomiting syndrome 11/01/2017  . Epigastric pain   . Nausea & vomiting 10/24/2017  . Hypokalemia 09/27/2017  . Elevated LFTs 09/27/2017  . Abnormal biliary HIDA scan 09/27/2017  . Marijuana abuse 09/27/2017  . Intractable nausea and vomiting 09/26/2017  . Cannabis hyperemesis syndrome concurrent with and due to cannabis abuse ([place]) 09/16/2017  . Nausea and vomiting 09/06/2017  . Dermoid 08/22/2017    Past Surgical History:  Procedure Laterality Date  . CESAREAN SECTION  '99 and '08   x 2  . CHOLECYSTECTOMY N/A 10/01/2017   Procedure: LAPAROSCOPIC CHOLECYSTECTOMY WITH POSSIBLE  INTRAOPERATIVE CHOLANGIOGRAM;  Surgeon: Stark Klein, MD;  Location: [facility];  Service: General;  Laterality: N/A;  . ESOPHAGOGASTRODUODENOSCOPY N/A 09/16/2017   Procedure:  ESOPHAGOGASTRODUODENOSCOPY (EGD) WITH PROPOFOL;  Surgeon: Clarene Essex, MD;  Location: [place];  Service: Endoscopy;  Laterality: N/A;  . LAPAROSCOPY Right 08/22/2017   Procedure: LAPAROSCOPY OPERATIVE removal of right ovary and right dermoid cyst;  Surgeon: Sloan Leiter, MD;  Location: [place] [facility];  Service: Gynecology;  Laterality: Right;     OB History    Gravida  3   Para  3   Term      Preterm      AB      Living  3     SAB      TAB      Ectopic      Multiple      Live Births               Home Medications    Prior to Admission medications   Medication Sig Start Date End Date Taking? Authorizing Provider  hydrOXYzine (ATARAX/VISTARIL) 25 MG tablet Take 50 mg by mouth at bedtime as needed for nausea or vomiting.  09/20/17   [provider]  methylPREDNISolone (MEDROL DOSEPAK) 4 MG TBPK tablet tad 03/08/18   Raylene Everts, MD  metoCLOPramide (REGLAN) 5 MG tablet Take 1 tablet (5 mg total) by mouth 3 (three) times daily before meals. 10/30/17   Charlynne Cousins, MD  mirtazapine (REMERON) 45 MG tablet Take 45 mg by mouth at bedtime.  10/16/17   [provider]  pantoprazole (PROTONIX) 40 MG tablet Take 1 tablet (40 mg total) by mouth daily. 04/24/18   Sherwood Gambler, MD  promethazine (PHENERGAN) 25 MG tablet Take 1 tablet (25 mg total) by mouth every 6 (six) hours as needed for nausea or vomiting. 04/24/18   Sherwood Gambler, MD  simethicone (GAS-X) 80 MG chewable tablet Chew 1 tablet (80 mg total) by mouth 4 (four) times daily as needed for flatulence. 10/30/17 10/30/18  Charlynne Cousins, MD  traZODone (DESYREL) 100 MG tablet Take 300 mg by mouth at bedtime.     [provider]    Family History Family History  Problem Relation Age of Onset  . Hypertension Mother     Social History Social History   Tobacco Use  . Smoking status: Former Smoker    Packs/day: 0.50  . Smokeless tobacco: Never Used  Substance Use Topics  .  Alcohol use: No    Comment: occasionally  . Drug use: Yes    Types: Other-see comments, Marijuana    Comment: Occasionally smokes marijuana      Allergies   Penicillins and Strawberry (diagnostic)   Review of Systems Review of Systems  Constitutional: Negative for fever.  Respiratory: Negative for cough and shortness of breath.   Gastrointestinal: Positive for abdominal pain, diarrhea, nausea and vomiting.  Genitourinary: Negative for menstrual problem.  All other systems reviewed and are negative.    Physical Exam Updated Vital Signs BP (!) 161/103   Pulse 82   Temp 97.7 F (36.5 C) (Oral)   Resp 16   SpO2 98%   Physical Exam Vitals signs and nursing note reviewed.  Constitutional:      Appearance: She is well-developed. She is obese. She is not ill-appearing or diaphoretic.  HENT:     Head: Normocephalic and atraumatic.     Right Ear: External ear normal.     Left Ear: External ear normal.     Nose: Nose normal.  Eyes:     General:        Right eye: No discharge.        Left eye: No discharge.  Cardiovascular:     Rate and Rhythm: Normal rate and regular rhythm.  Pulmonary:     Effort: Pulmonary effort is normal.  Abdominal:     Palpations: Abdomen is soft.     Tenderness: There is abdominal tenderness in the right upper quadrant, epigastric area and left upper quadrant.  Skin:    General: Skin is warm and dry.  Neurological:     Mental Status: She is alert.  Psychiatric:        Mood and Affect: Mood is not anxious.      ED Treatments / Results  Labs (all labs ordered are listed, but only abnormal results are displayed) Labs Reviewed  LIPASE, BLOOD  I-STAT BETA HCG BLOOD, ED ([facility], [facility], [facility] ONLY)    EKG EKG Interpretation  Date/Time:  Friday April 24 2018 16:56:56 EDT Ventricular Rate:  76 PR Interval:    QRS Duration: 99 QT Interval:  402 QTC Calculation: 452 R Axis:   71 Text Interpretation:  Normal sinus rhythm no acute ST/T changes  Reconfirmed by Sherwood Gambler [phone]) on 04/24/2018 5:04:34 PM   Radiology No results found.  Procedures Procedures (including critical care time)  Medications Ordered in ED Medications  lactated ringers bolus 1,000 mL (0 mLs Intravenous Stopped 04/24/18 1909)  haloperidol lactate (HALDOL) injection 5 mg (5 mg Intravenous Given 04/24/18 1716)  promethazine (PHENERGAN) injection 25 mg (25 mg Intravenous Given 04/24/18 1833)  HYDROmorphone (DILAUDID) injection 1 mg (1 mg Intravenous Given 04/24/18 1834)     Initial Impression / Assessment and Plan / ED Course  I have reviewed the triage vital signs and the nursing notes.  Pertinent labs & imaging results that were available during my care of the patient were reviewed by me and considered in my medical decision making (see chart for details).        Patient is feeling better after the Haldol but still having some discomfort and still vomited once.  She was given IV Dilaudid and Phenergan.  Now reports no more vomiting though still uncomfortable.  However she feels well enough for discharge home.  Labs from urgent care reviewed and the elevated WC is probably reactive to the numerous vomiting episodes rather than pathologic from an emergent condition.  I do not think she needs emergent CT or other imaging.  Given she feels well enough for home, I think is reasonable to discharge home with Phenergan and she also asked for a refill prescription of Protonix.  Final Clinical Impressions(s) / ED Diagnoses   Final diagnoses:  Gastroparesis    ED Discharge Orders         Ordered    promethazine (PHENERGAN) 25 MG tablet  Every 6 hours PRN     04/24/18 1919    pantoprazole (PROTONIX) 40 MG tablet  Daily     04/24/18 1919           Sherwood Gambler, MD 04/24/18 1924

## 2018-04-24 NOTE — Telephone Encounter (Signed)
Attempted several times to call patient to instruct her to return to ER. No answer, no voicemail available.

## 2018-04-24 NOTE — ED Provider Notes (Addendum)
[place]    CSN: [id number] Arrival date & time: 04/24/18  1309     History   Chief Complaint Chief Complaint  Patient presents with  . Emesis    HPI Haley Freeman is a 43 y.o. female history of cyclical vomiting syndrome, previous cholecystectomy, gastroparesis presenting today for evaluation of abdominal pain and vomiting.  Patient states that beginning yesterday she started to have abdominal cramping/spasming that has led her to heave and vomit.  She denies associated changes in bowel movements or diarrhea.  Denies fevers or associated URI symptoms of congestion, cough or sore throat.  She does note that her symptoms feel very similar to previous episode she has had which is previously led to admissions.  Last episode in 10/2017.  At that time she had her gallbladder removed and also had gastric emptying study showing gastroparesis.  Endoscopy previously in 09/2017.  She has not seen gastroenterology since.  Initially her recurrent episodes were thought to be from marijuana use, but states that this is more from her gastroparesis.  She does admit to using alcohol and marijuana approximately 5 days ago.  She states that her use is infrequent.  She has had 5 episodes of vomiting today.  Denies hematemesis.  She used Reglan earlier today, but vomited it back up.  States that she has not had much success with Zofran, feels Phenergan is more helpful.  She has not continued to take Protonix which she previously was discharged on.   HPI  Past Medical History:  Diagnosis Date  . Alcohol abuse   . Anxiety   . Depression   . Headache(784.0)   . Neuromuscular disorder [facility])     Patient Active Problem List   Diagnosis Date Noted  . Cyclic vomiting syndrome 11/01/2017  . Epigastric pain   . Nausea & vomiting 10/24/2017  . Hypokalemia 09/27/2017  . Elevated LFTs 09/27/2017  . Abnormal biliary HIDA scan 09/27/2017  . Marijuana abuse 09/27/2017  . Intractable nausea  and vomiting 09/26/2017  . Cannabis hyperemesis syndrome concurrent with and due to cannabis abuse ([place]) 09/16/2017  . Nausea and vomiting 09/06/2017  . Dermoid 08/22/2017    Past Surgical History:  Procedure Laterality Date  . CESAREAN SECTION  '99 and '08   x 2  . CHOLECYSTECTOMY N/A 10/01/2017   Procedure: LAPAROSCOPIC CHOLECYSTECTOMY WITH POSSIBLE  INTRAOPERATIVE CHOLANGIOGRAM;  Surgeon: Stark Klein, MD;  Location: [facility];  Service: General;  Laterality: N/A;  . ESOPHAGOGASTRODUODENOSCOPY N/A 09/16/2017   Procedure: ESOPHAGOGASTRODUODENOSCOPY (EGD) WITH PROPOFOL;  Surgeon: Clarene Essex, MD;  Location: [place];  Service: Endoscopy;  Laterality: N/A;  . LAPAROSCOPY Right 08/22/2017   Procedure: LAPAROSCOPY OPERATIVE removal of right ovary and right dermoid cyst;  Surgeon: Sloan Leiter, MD;  Location: [place] [facility];  Service: Gynecology;  Laterality: Right;    OB History    Gravida  3   Para  3   Term      Preterm      AB      Living  3     SAB      TAB      Ectopic      Multiple      Live Births               Home Medications    Prior to Admission medications   Medication Sig Start Date End Date Taking? Authorizing Provider  hydrOXYzine (ATARAX/VISTARIL) 25 MG tablet Take 50 mg by mouth at bedtime  as needed for nausea or vomiting.  09/20/17   [provider]  methylPREDNISolone (MEDROL DOSEPAK) 4 MG TBPK tablet tad 03/08/18   Raylene Everts, MD  metoCLOPramide (REGLAN) 5 MG tablet Take 1 tablet (5 mg total) by mouth 3 (three) times daily before meals. 10/30/17   Charlynne Cousins, MD  mirtazapine (REMERON) 45 MG tablet Take 45 mg by mouth at bedtime.  10/16/17   [provider]  simethicone (GAS-X) 80 MG chewable tablet Chew 1 tablet (80 mg total) by mouth 4 (four) times daily as needed for flatulence. 10/30/17 10/30/18  Charlynne Cousins, MD  traZODone (DESYREL) 100 MG tablet Take 300 mg by mouth at bedtime.     [provider]    Family History Family History  Problem Relation Age of Onset  . Hypertension Mother     Social History Social History   Tobacco Use  . Smoking status: Former Smoker    Packs/day: 0.50  . Smokeless tobacco: Never Used  Substance Use Topics  . Alcohol use: No    Comment: occasionally  . Drug use: Yes    Types: Other-see comments, Marijuana    Comment: Occasionally smokes marijuana      Allergies   Penicillins and Strawberry (diagnostic)   Review of Systems Review of Systems  Constitutional: Negative for activity change, appetite change, chills, fatigue and fever.  HENT: Negative for congestion, ear pain, rhinorrhea, sinus pressure, sore throat and trouble swallowing.   Eyes: Negative for discharge and redness.  Respiratory: Negative for cough, chest tightness and shortness of breath.   Cardiovascular: Negative for chest pain.  Gastrointestinal: Positive for abdominal pain, nausea and vomiting. Negative for diarrhea.  Genitourinary: Negative for dysuria, frequency, hematuria, urgency and vaginal discharge.  Musculoskeletal: Negative for myalgias and neck stiffness.  Skin: Negative for rash.  Neurological: Negative for dizziness, light-headedness and headaches.     Physical Exam Triage Vital Signs ED Triage Vitals  Enc Vitals Group     BP 04/24/18 1319 (!) 150/99     Pulse Rate 04/24/18 1319 94     Resp 04/24/18 1319 18     Temp 04/24/18 1319 98.2 F (36.8 C)     Temp Source 04/24/18 1319 Oral     SpO2 04/24/18 1319 98 %     Weight --      Height --      Head Circumference --      Peak Flow --      Pain Score 04/24/18 1317 0     Pain Loc --      Pain Edu? --      Excl. in [place]? --    No data found.  Updated Vital Signs BP (!) 150/99 (BP Location: Left Arm)   Pulse 94   Temp 98.2 F (36.8 C) (Oral)   Resp 18   SpO2 98%   Visual Acuity Right Eye Distance:   Left Eye Distance:   Bilateral Distance:    Right Eye Near:   Left Eye Near:     Bilateral Near:     Physical Exam Vitals signs and nursing note reviewed.  Constitutional:      General: She is not in acute distress.    Appearance: She is well-developed.     Comments: Pacing around in room, appears uncomfortable  HENT:     Head: Normocephalic and atraumatic.     Mouth/Throat:     Comments: Oral mucosa pink and moist, no tonsillar enlargement or  exudate. Posterior pharynx patent and nonerythematous, no uvula deviation or swelling. Normal phonation. Eyes:     Extraocular Movements: Extraocular movements intact.     Conjunctiva/sclera: Conjunctivae normal.     Pupils: Pupils are equal, round, and reactive to light.  Neck:     Musculoskeletal: Neck supple.  Cardiovascular:     Rate and Rhythm: Normal rate and regular rhythm.     Heart sounds: No murmur.  Pulmonary:     Effort: Pulmonary effort is normal. No respiratory distress.     Breath sounds: Normal breath sounds.     Comments: Breathing comfortably at rest, CTABL, no wheezing, rales or other adventitious sounds auscultated Abdominal:     Palpations: Abdomen is soft.     Tenderness: There is abdominal tenderness.     Comments: Abdomen soft, significant tenderness throughout bilateral upper quadrants and epigastrium, more focal on left upper quadrant, negative rebound, negative Murphy's  Nontender to bilateral lower quadrants  Frequently heaving and vomiting throughout visit  Skin:    General: Skin is warm and dry.  Neurological:     Mental Status: She is alert.      UC Treatments / Results  Labs (all labs ordered are listed, but only abnormal results are displayed) Labs Reviewed  CBC  COMPREHENSIVE METABOLIC PANEL    EKG None  Radiology No results found.  Procedures Procedures (including critical care time)  Medications Ordered in UC Medications  metoCLOPramide (REGLAN) injection 10 mg (10 mg Intramuscular Given 04/24/18 1343)  sodium chloride 0.9 % bolus 1,000 mL (1,000 mLs  Intravenous New Bag/Given 04/24/18 1424)  ondansetron (ZOFRAN) injection 4 mg (4 mg Intravenous Given 04/24/18 1425)    Initial Impression / Assessment and Plan / UC Course  I have reviewed the triage vital signs and the nursing notes.  Pertinent labs & imaging results that were available during my care of the patient were reviewed by me and considered in my medical decision making (see chart for details).     Patient was given 10 mg Reglan, still persistent nausea and vomiting in clinic after administration.  Provided fluids as well as 4 mg IV Zofran. No phenergan available in clinic. Had received approximately 250 mL of fluids of the 1L and she felt her symptoms are progressing and worsening and opting further evaluation and treatment in emergency room, as she feels she will likely end up there regardless.  Patient stable on discharge, transported to ED with nursing staff. Final Clinical Impressions(s) / UC Diagnoses   Final diagnoses:  Intractable vomiting with nausea, unspecified vomiting type     Discharge Instructions     Discharged to ED    ED Prescriptions    None     Controlled Substance Prescriptions [place] Controlled Substance Registry consulted? Not Applicable   Janith Lima, PA-C 04/24/18 [address], [place] C, PA-C 04/24/18 1507    Janith Lima, [place] 04/24/18 1511

## 2018-04-24 NOTE — Discharge Instructions (Signed)
Discharged to ED 

## 2018-04-24 NOTE — ED Notes (Signed)
Pt came down from UC but decided on arrival she did not want to stay. Removed her IV. Ambulatory at discharge.

## 2018-04-24 NOTE — Discharge Instructions (Signed)
If you develop worsening, continued, or recurrent abdominal pain, uncontrolled vomiting, fever, chest or back pain, or any other new/concerning symptoms then return to the ER for evaluation.  

## 2018-04-24 NOTE — ED Notes (Signed)
Patient continues to vomit.

## 2018-04-26 ENCOUNTER — Other Ambulatory Visit: Payer: Self-pay

## 2018-04-26 ENCOUNTER — Emergency Department (HOSPITAL_COMMUNITY)
Admission: EM | Admit: 2018-04-26 | Discharge: 2018-04-26 | Disposition: A | Discharge status: Home / Self Care | Payer: Medicare Other | Attending: Emergency Medicine | Admitting: Emergency Medicine

## 2018-04-26 ENCOUNTER — Encounter (HOSPITAL_COMMUNITY): Payer: Self-pay | Admitting: *Deleted

## 2018-04-26 DIAGNOSIS — R112 Nausea with vomiting, unspecified: Secondary | ICD-10-CM | POA: Insufficient documentation

## 2018-04-26 DIAGNOSIS — R1013 Epigastric pain: Secondary | ICD-10-CM | POA: Diagnosis not present

## 2018-04-26 DIAGNOSIS — R197 Diarrhea, unspecified: Secondary | ICD-10-CM | POA: Diagnosis not present

## 2018-04-26 DIAGNOSIS — Z87891 Personal history of nicotine dependence: Secondary | ICD-10-CM | POA: Insufficient documentation

## 2018-04-26 DIAGNOSIS — Z79899 Other long term (current) drug therapy: Secondary | ICD-10-CM | POA: Insufficient documentation

## 2018-04-26 LAB — URINALYSIS, ROUTINE W REFLEX MICROSCOPIC
Bilirubin Urine: NEGATIVE
Glucose, UA: NEGATIVE mg/dL
Ketones, ur: 20 mg/dL — AB
Leukocytes,Ua: NEGATIVE
Nitrite: NEGATIVE
Protein, ur: NEGATIVE mg/dL
Specific Gravity, Urine: 1.011 (ref 1.005–1.030)
pH: 6 (ref 5.0–8.0)

## 2018-04-26 LAB — COMPREHENSIVE METABOLIC PANEL
ALT: 23 U/L (ref 0–44)
AST: 52 U/L — ABNORMAL HIGH (ref 15–41)
Albumin: 4.2 g/dL (ref 3.5–5.0)
Alkaline Phosphatase: 112 U/L (ref 38–126)
Anion gap: 13 (ref 5–15)
BUN: 14 mg/dL (ref 6–20)
CO2: 21 mmol/L — ABNORMAL LOW (ref 22–32)
Calcium: 9.4 mg/dL (ref 8.9–10.3)
Chloride: 103 mmol/L (ref 98–111)
Creatinine, Ser: 1.17 mg/dL — ABNORMAL HIGH (ref 0.44–1.00)
GFR calc Af Amer: >60 mL/min (ref >60)
GFR calc non Af Amer: 57 mL/min — ABNORMAL LOW (ref >60)
Glucose, Bld: 126 mg/dL — ABNORMAL HIGH (ref 70–99)
Potassium: 3.6 mmol/L (ref 3.5–5.1)
Sodium: 137 mmol/L (ref 135–145)
Total Bilirubin: 0.5 mg/dL (ref 0.3–1.2)
Total Protein: 8.6 g/dL — ABNORMAL HIGH (ref 6.5–8.1)

## 2018-04-26 LAB — CBC
HCT: 40.9 % (ref 36.0–46.0)
Hemoglobin: 12.4 g/dL (ref 12.0–15.0)
MCH: 23 pg — ABNORMAL LOW (ref 26.0–34.0)
MCHC: 30.3 g/dL (ref 30.0–36.0)
MCV: 75.9 fL — ABNORMAL LOW (ref 80.0–100.0)
Platelets: 380 10*3/uL (ref 150–400)
RBC: 5.39 MIL/uL — ABNORMAL HIGH (ref 3.87–5.11)
RDW: 19.3 % — ABNORMAL HIGH (ref 11.5–15.5)
WBC: 18.9 10*3/uL — ABNORMAL HIGH (ref 4.0–10.5)
nRBC: 0 % (ref 0.0–0.2)

## 2018-04-26 LAB — LIPASE, BLOOD: Lipase: 30 U/L (ref 11–51)

## 2018-04-26 LAB — I-STAT BETA HCG BLOOD, ED (MC, WL, AP ONLY): I-stat hCG, quantitative: 6.2 m[IU]/mL — ABNORMAL HIGH (ref <5)

## 2018-04-26 MED ORDER — CAPSAICIN 0.025 % EX CREA
TOPICAL_CREAM | Freq: Once | CUTANEOUS | Status: AC
  Administered 2018-04-26: 10:00:00 via TOPICAL
  Filled 2018-04-26: qty 60

## 2018-04-26 MED ORDER — SUCRALFATE 1 G PO TABS: 1.0000 g | ORAL_TABLET | Freq: Three times a day (TID) | ORAL | 0 refills | Status: DC

## 2018-04-26 MED ORDER — SODIUM CHLORIDE 0.9% FLUSH
3.0000 mL | Freq: Once | INTRAVENOUS | Status: AC
  Administered 2018-04-26: 3 mL via INTRAVENOUS

## 2018-04-26 MED ORDER — PROMETHAZINE HCL 25 MG RE SUPP: 25.0000 mg | Freq: Four times a day (QID) | RECTAL | 0 refills | Status: DC | PRN

## 2018-04-26 MED ORDER — METOCLOPRAMIDE HCL 5 MG/ML IJ SOLN
10.0000 mg | Freq: Once | INTRAMUSCULAR | Status: AC
  Administered 2018-04-26: 10 mg via INTRAVENOUS
  Filled 2018-04-26: qty 2

## 2018-04-26 MED ORDER — LORAZEPAM 1 MG PO TABS: 1.0000 mg | ORAL_TABLET | Freq: Four times a day (QID) | ORAL | 0 refills | Status: DC | PRN

## 2018-04-26 MED ORDER — CAPSAICIN 0.075 % EX CREA
TOPICAL_CREAM | Freq: Once | CUTANEOUS | Status: DC
  Filled 2018-04-26: qty 60

## 2018-04-26 MED ORDER — FAMOTIDINE IN NACL 20-0.9 MG/50ML-% IV SOLN
20.0000 mg | Freq: Once | INTRAVENOUS | Status: AC
  Administered 2018-04-26: 20 mg via INTRAVENOUS
  Filled 2018-04-26: qty 50

## 2018-04-26 MED ORDER — SODIUM CHLORIDE 0.9 % IV BOLUS
2000.0000 mL | Freq: Once | INTRAVENOUS | Status: AC
  Administered 2018-04-26: 2000 mL via INTRAVENOUS

## 2018-04-26 MED ORDER — SUCRALFATE 1 G PO TABS
1.0000 g | ORAL_TABLET | Freq: Once | ORAL | Status: AC
  Administered 2018-04-26: 1 g via ORAL
  Filled 2018-04-26: qty 1

## 2018-04-26 MED ORDER — LORAZEPAM 2 MG/ML IJ SOLN
1.0000 mg | Freq: Once | INTRAMUSCULAR | Status: AC
  Administered 2018-04-26: 1 mg via INTRAVENOUS
  Filled 2018-04-26: qty 1

## 2018-04-26 NOTE — ED Provider Notes (Signed)
[facility] DEPT Provider Note   CSN: [id number] Arrival date & time: 04/26/18  8366    History   Chief Complaint Chief Complaint  Patient presents with  . Emesis    HPI Haley Freeman is a 43 y.o. female.     HPI Patient with history of recurrent nausea, vomiting and abdominal pain related to gastroparesis presents with nausea vomiting diarrhea and abdominal pain which started 5 days ago.  She states this started after eating [place] food.  She has noticed no blood in the vomit or stool.  She is had no fever or chills.  She does admit to recent marijuana use.  Seen in the emergency department 2 days ago with similar symptoms and had laboratory work-up at that time.  Had some improvement with medications and was discharged home with prescriptions.  States she is still been unable to tolerate oral intake.  Past Medical History:  Diagnosis Date  . Alcohol abuse   . Anxiety   . Depression   . Headache(784.0)   . Neuromuscular disorder [facility])     Patient Active Problem List   Diagnosis Date Noted  . Cyclic vomiting syndrome 11/01/2017  . Epigastric pain   . Nausea & vomiting 10/24/2017  . Hypokalemia 09/27/2017  . Elevated LFTs 09/27/2017  . Abnormal biliary HIDA scan 09/27/2017  . Marijuana abuse 09/27/2017  . Intractable nausea and vomiting 09/26/2017  . Cannabis hyperemesis syndrome concurrent with and due to cannabis abuse ([place]) 09/16/2017  . Nausea and vomiting 09/06/2017  . Dermoid 08/22/2017    Past Surgical History:  Procedure Laterality Date  . CESAREAN SECTION  '99 and '08   x 2  . CHOLECYSTECTOMY N/A 10/01/2017   Procedure: LAPAROSCOPIC CHOLECYSTECTOMY WITH POSSIBLE  INTRAOPERATIVE CHOLANGIOGRAM;  Surgeon: Stark Klein, MD;  [facility];  Service: General;  Laterality: N/A;  . ESOPHAGOGASTRODUODENOSCOPY N/A 09/16/2017   Procedure: ESOPHAGOGASTRODUODENOSCOPY (EGD) WITH PROPOFOL;  Surgeon: Clarene Essex, MD;  Location: [place];  Service: Endoscopy;  Laterality: N/A;  . LAPAROSCOPY Right 08/22/2017   Procedure: LAPAROSCOPY OPERATIVE removal of right ovary and right dermoid cyst;  Surgeon: Sloan Leiter, MD;  Location: [place] [facility];  Service: Gynecology;  Laterality: Right;     OB History    Gravida  3   Para  3   Term      Preterm      AB      Living  3     SAB      TAB      Ectopic      Multiple      Live Births               Home Medications    Prior to Admission medications   Medication Sig Start Date End Date Taking? Authorizing Provider  hydrOXYzine (ATARAX/VISTARIL) 25 MG tablet Take 50 mg by mouth at bedtime as needed for nausea or vomiting.  09/20/17  Yes [provider]  metoCLOPramide (REGLAN) 5 MG tablet Take 1 tablet (5 mg total) by mouth 3 (three) times daily before meals. 10/30/17  Yes Charlynne Cousins, MD  mirtazapine (REMERON) 45 MG tablet Take 45 mg by mouth at bedtime.  10/16/17  Yes [provider]  pantoprazole (PROTONIX) 40 MG tablet Take 1 tablet (40 mg total) by mouth daily. 04/24/18  Yes Sherwood Gambler, MD  simethicone (GAS-X) 80 MG chewable tablet Chew 1 tablet (80 mg total) by mouth 4 (four) times daily as needed  for flatulence. 10/30/17 10/30/18 Yes Charlynne Cousins, MD  traZODone (DESYREL) 100 MG tablet Take 300 mg by mouth at bedtime.    Yes [provider]  LORazepam (ATIVAN) 1 MG tablet Take 1 tablet (1 mg total) by mouth every 6 (six) hours as needed for anxiety (nausea/vomiting). 04/26/18   Julianne Rice, MD  methylPREDNISolone (MEDROL DOSEPAK) 4 MG TBPK tablet tad Patient not taking: Reported on 04/26/2018 03/08/18   Raylene Everts, MD  promethazine (PHENERGAN) 25 MG suppository Place 1 suppository (25 mg total) rectally every 6 (six) hours as needed for nausea or vomiting. 04/26/18   Julianne Rice, MD  sucralfate (CARAFATE) 1 g tablet Take 1 tablet (1 g total) by mouth 4 (four) times daily -  with meals and at  bedtime. 04/26/18   Julianne Rice, MD    Family History Family History  Problem Relation Age of Onset  . Hypertension Mother     Social History Social History   Tobacco Use  . Smoking status: Former Smoker    Packs/day: 0.50  . Smokeless tobacco: Never Used  Substance Use Topics  . Alcohol use: No    Comment: occasionally  . Drug use: Yes    Types: Other-see comments, Marijuana    Comment: Occasionally smokes marijuana      Allergies   Penicillins and Strawberry (diagnostic)   Review of Systems Review of Systems  Constitutional: Negative for chills and fever.  HENT: Negative for trouble swallowing.   Respiratory: Negative for cough and shortness of breath.   Cardiovascular: Negative for chest pain.  Gastrointestinal: Positive for abdominal pain, diarrhea, nausea and vomiting. Negative for blood in stool and constipation.  Genitourinary: Negative for dysuria, flank pain and frequency.  Musculoskeletal: Negative for back pain, myalgias and neck pain.  Skin: Negative for rash and wound.  Neurological: Negative for dizziness, weakness, light-headedness, numbness and headaches.  All other systems reviewed and are negative.    Physical Exam Updated Vital Signs BP (!) 141/99 (BP Location: Right Arm)   Pulse 89   Temp 98.5 F (36.9 C) (Oral)   Resp 16   Ht 5\' 8"  (1.727 m)   Wt 136.1 kg   SpO2 100%   BMI 45.61 kg/m   Physical Exam Vitals signs and nursing note reviewed.  Constitutional:      Appearance: Normal appearance. She is well-developed.  HENT:     Head: Normocephalic and atraumatic.     Mouth/Throat:     Mouth: Mucous membranes are moist.  Eyes:     Pupils: Pupils are equal, round, and reactive to light.  Neck:     Musculoskeletal: Normal range of motion and neck supple.  Cardiovascular:     Rate and Rhythm: Normal rate and regular rhythm.  Pulmonary:     Effort: Pulmonary effort is normal.     Breath sounds: Normal breath sounds.  Abdominal:      General: Bowel sounds are normal.     Palpations: Abdomen is soft.     Tenderness: There is abdominal tenderness. There is no guarding or rebound.     Comments: Diffuse upper abdominal tenderness to palpation.  No rebound or guarding.  Musculoskeletal: Normal range of motion.        General: No swelling, tenderness, deformity or signs of injury.     Right lower leg: No edema.     Left lower leg: No edema.  Skin:    General: Skin is warm and dry.     Capillary  Refill: Capillary refill takes less than 2 seconds.     Findings: No erythema or rash.  Neurological:     General: No focal deficit present.     Mental Status: She is alert and oriented to person, place, and time.  Psychiatric:        Mood and Affect: Mood normal.        Behavior: Behavior normal.      ED Treatments / Results  Labs (all labs ordered are listed, but only abnormal results are displayed) Labs Reviewed  COMPREHENSIVE METABOLIC PANEL - Abnormal; Notable for the following components:      Result Value   CO2 21 (*)    Glucose, Bld 126 (*)    Creatinine, Ser 1.17 (*)    Total Protein 8.6 (*)    AST 52 (*)    GFR calc non Af Amer 57 (*)    All other components within normal limits  CBC - Abnormal; Notable for the following components:   WBC 18.9 (*)    RBC 5.39 (*)    MCV 75.9 (*)    MCH 23.0 (*)    RDW 19.3 (*)    All other components within normal limits  URINALYSIS, ROUTINE W REFLEX MICROSCOPIC - Abnormal; Notable for the following components:   Hgb urine dipstick SMALL (*)    Ketones, ur 20 (*)    Bacteria, UA RARE (*)    All other components within normal limits  I-STAT BETA HCG BLOOD, ED ([facility], [facility], [facility] ONLY) - Abnormal; Notable for the following components:   I-stat hCG, quantitative 6.2 (*)    All other components within normal limits  LIPASE, BLOOD    EKG EKG Interpretation  Date/Time:  Sunday April 26 2018 10:02:16 EDT Ventricular Rate:  74 PR Interval:    QRS Duration: 97 QT  Interval:  410 QTC Calculation: 455 R Axis:   59 Text Interpretation:  Sinus rhythm Probable left atrial enlargement Confirmed by Julianne Rice [phone]) on 04/26/2018 10:12:41 AM   Radiology No results found.  Procedures Procedures (including critical care time)  Medications Ordered in ED Medications  sodium chloride flush (NS) 0.9 % injection 3 mL (3 mLs Intravenous Given 04/26/18 0934)  sodium chloride 0.9 % bolus 2,000 mL (2,000 mLs Intravenous New Bag/Given 04/26/18 0951)  LORazepam (ATIVAN) injection 1 mg (1 mg Intravenous Given 04/26/18 0952)  metoCLOPramide (REGLAN) injection 10 mg (10 mg Intravenous Given 04/26/18 0952)  famotidine (PEPCID) IVPB 20 mg premix (0 mg Intravenous Stopped 04/26/18 1227)  capsaicin (ZOSTRIX) 0.025 % cream ( Topical Given 04/26/18 1027)  sucralfate (CARAFATE) tablet 1 g (1 g Oral Given 04/26/18 1227)     Initial Impression / Assessment and Plan / ED Course  I have reviewed the triage vital signs and the nursing notes.  Pertinent labs & imaging results that were available during my care of the patient were reviewed by me and considered in my medical decision making (see chart for details).       Patient states she is feeling much better after medication.  No further vomiting in the emergency department.  Will discharge with PR Phenergan.  Also given benefit of Ativan will give small supply as well as Carafate.  She is advised to follow-up with her gastroenterologist.  She understands that if her nausea and vomiting continue that she will need to return to the emergency department. Final Clinical Impressions(s) / ED Diagnoses   Final diagnoses:  Nausea vomiting and diarrhea  Epigastric pain  ED Discharge Orders         Ordered    promethazine (PHENERGAN) 25 MG suppository  Every 6 hours PRN     04/26/18 1311    LORazepam (ATIVAN) 1 MG tablet  Every 6 hours PRN     04/26/18 1311    sucralfate (CARAFATE) 1 g tablet  3 times daily with meals &  bedtime     04/26/18 1311           Julianne Rice, MD 04/26/18 1313

## 2018-04-26 NOTE — ED Triage Notes (Signed)
Pt seen here on 4/17 with same nausea and vomiting. States they offered to admit her but she wanted to go home and try the meds. Today pain with N/V continues. States last time she was able to keep food down was 5 days ago.

## 2018-05-03 ENCOUNTER — Observation Stay (HOSPITAL_COMMUNITY)
Admission: EM | Admit: 2018-05-03 | Discharge: 2018-05-04 | Disposition: A | Discharge status: Home / Self Care | Payer: Medicare Other | Attending: Internal Medicine | Admitting: Internal Medicine

## 2018-05-03 ENCOUNTER — Emergency Department (HOSPITAL_COMMUNITY): Payer: Medicare Other

## 2018-05-03 ENCOUNTER — Encounter (HOSPITAL_COMMUNITY): Payer: Self-pay | Admitting: *Deleted

## 2018-05-03 DIAGNOSIS — R112 Nausea with vomiting, unspecified: Secondary | ICD-10-CM | POA: Diagnosis not present

## 2018-05-03 DIAGNOSIS — Z880 Allergy status to penicillin: Secondary | ICD-10-CM | POA: Insufficient documentation

## 2018-05-03 DIAGNOSIS — R1110 Vomiting, unspecified: Secondary | ICD-10-CM | POA: Diagnosis not present

## 2018-05-03 DIAGNOSIS — D649 Anemia, unspecified: Secondary | ICD-10-CM

## 2018-05-03 DIAGNOSIS — R52 Pain, unspecified: Secondary | ICD-10-CM | POA: Diagnosis not present

## 2018-05-03 DIAGNOSIS — F419 Anxiety disorder, unspecified: Secondary | ICD-10-CM | POA: Insufficient documentation

## 2018-05-03 DIAGNOSIS — D72829 Elevated white blood cell count, unspecified: Secondary | ICD-10-CM | POA: Diagnosis not present

## 2018-05-03 DIAGNOSIS — Z7151 Drug abuse counseling and surveillance of drug abuser: Secondary | ICD-10-CM | POA: Insufficient documentation

## 2018-05-03 DIAGNOSIS — N920 Excessive and frequent menstruation with regular cycle: Secondary | ICD-10-CM | POA: Diagnosis not present

## 2018-05-03 DIAGNOSIS — K3184 Gastroparesis: Secondary | ICD-10-CM | POA: Diagnosis not present

## 2018-05-03 DIAGNOSIS — F12188 Cannabis abuse with other cannabis-induced disorder: Secondary | ICD-10-CM | POA: Diagnosis present

## 2018-05-03 DIAGNOSIS — F329 Major depressive disorder, single episode, unspecified: Secondary | ICD-10-CM | POA: Insufficient documentation

## 2018-05-03 DIAGNOSIS — F1210 Cannabis abuse, uncomplicated: Secondary | ICD-10-CM | POA: Insufficient documentation

## 2018-05-03 DIAGNOSIS — D509 Iron deficiency anemia, unspecified: Secondary | ICD-10-CM | POA: Diagnosis not present

## 2018-05-03 DIAGNOSIS — Z87891 Personal history of nicotine dependence: Secondary | ICD-10-CM | POA: Diagnosis not present

## 2018-05-03 DIAGNOSIS — Z79899 Other long term (current) drug therapy: Secondary | ICD-10-CM | POA: Diagnosis not present

## 2018-05-03 DIAGNOSIS — R1084 Generalized abdominal pain: Secondary | ICD-10-CM | POA: Diagnosis not present

## 2018-05-03 DIAGNOSIS — R197 Diarrhea, unspecified: Secondary | ICD-10-CM

## 2018-05-03 DIAGNOSIS — R1111 Vomiting without nausea: Secondary | ICD-10-CM | POA: Diagnosis not present

## 2018-05-03 HISTORY — DX: Anemia, unspecified: D64.9

## 2018-05-03 LAB — DIFFERENTIAL
Basophils Absolute: 0 10*3/uL (ref 0.0–0.1)
Basophils Relative: 0 %
Eosinophils Absolute: 0 10*3/uL (ref 0.0–0.5)
Eosinophils Relative: 0 %
Lymphocytes Relative: 7 %
Lymphs Abs: 1.2 10*3/uL (ref 0.7–4.0)
Monocytes Absolute: 0.3 10*3/uL (ref 0.1–1.0)
Monocytes Relative: 2 %
Neutro Abs: 16.6 10*3/uL — ABNORMAL HIGH (ref 1.7–7.7)
Neutrophils Relative %: 91 %

## 2018-05-03 LAB — I-STAT BETA HCG BLOOD, ED (MC, WL, AP ONLY): I-stat hCG, quantitative: <5 m[IU]/mL (ref <5)

## 2018-05-03 LAB — URINALYSIS, ROUTINE W REFLEX MICROSCOPIC
Bilirubin Urine: NEGATIVE
Glucose, UA: NEGATIVE mg/dL
Hgb urine dipstick: NEGATIVE
Ketones, ur: 20 mg/dL — AB
Leukocytes,Ua: NEGATIVE
Nitrite: NEGATIVE
Protein, ur: 30 mg/dL — AB
Specific Gravity, Urine: 1.039 — ABNORMAL HIGH (ref 1.005–1.030)
pH: 5 (ref 5.0–8.0)

## 2018-05-03 LAB — COMPREHENSIVE METABOLIC PANEL
ALT: 17 U/L (ref 0–44)
AST: 19 U/L (ref 15–41)
Albumin: 4.5 g/dL (ref 3.5–5.0)
Alkaline Phosphatase: 108 U/L (ref 38–126)
Anion gap: 12 (ref 5–15)
BUN: 14 mg/dL (ref 6–20)
CO2: 21 mmol/L — ABNORMAL LOW (ref 22–32)
Calcium: 9.3 mg/dL (ref 8.9–10.3)
Chloride: 107 mmol/L (ref 98–111)
Creatinine, Ser: 1 mg/dL (ref 0.44–1.00)
GFR calc Af Amer: >60 mL/min (ref >60)
GFR calc non Af Amer: >60 mL/min (ref >60)
Glucose, Bld: 118 mg/dL — ABNORMAL HIGH (ref 70–99)
Potassium: 3.7 mmol/L (ref 3.5–5.1)
Sodium: 140 mmol/L (ref 135–145)
Total Bilirubin: 0.2 mg/dL — ABNORMAL LOW (ref 0.3–1.2)
Total Protein: 8.7 g/dL — ABNORMAL HIGH (ref 6.5–8.1)

## 2018-05-03 LAB — CBC
HCT: 38.8 % (ref 36.0–46.0)
Hemoglobin: 11.6 g/dL — ABNORMAL LOW (ref 12.0–15.0)
MCH: 23.3 pg — ABNORMAL LOW (ref 26.0–34.0)
MCHC: 29.9 g/dL — ABNORMAL LOW (ref 30.0–36.0)
MCV: 77.9 fL — ABNORMAL LOW (ref 80.0–100.0)
Platelets: 297 10*3/uL (ref 150–400)
RBC: 4.98 MIL/uL (ref 3.87–5.11)
RDW: 19.2 % — ABNORMAL HIGH (ref 11.5–15.5)
WBC: 18.5 10*3/uL — ABNORMAL HIGH (ref 4.0–10.5)
nRBC: 0 % (ref 0.0–0.2)

## 2018-05-03 LAB — LIPASE, BLOOD: Lipase: 56 U/L — ABNORMAL HIGH (ref 11–51)

## 2018-05-03 MED ORDER — SODIUM CHLORIDE 0.9% FLUSH
3.0000 mL | Freq: Once | INTRAVENOUS | Status: AC
  Administered 2018-05-03: 3 mL via INTRAVENOUS

## 2018-05-03 MED ORDER — PANTOPRAZOLE SODIUM 40 MG IV SOLR
40.0000 mg | Freq: Every day | INTRAVENOUS | Status: DC
  Administered 2018-05-03: 40 mg via INTRAVENOUS
  Filled 2018-05-03: qty 40

## 2018-05-03 MED ORDER — SODIUM CHLORIDE 0.9 % IV BOLUS
1000.0000 mL | Freq: Once | INTRAVENOUS | Status: AC
  Administered 2018-05-03: 1000 mL via INTRAVENOUS

## 2018-05-03 MED ORDER — SODIUM CHLORIDE (PF) 0.9 % IJ SOLN
INTRAMUSCULAR | Status: AC
  Administered 2018-05-03: 10 mL
  Filled 2018-05-03: qty 50

## 2018-05-03 MED ORDER — MIRTAZAPINE 15 MG PO TABS
45.0000 mg | ORAL_TABLET | Freq: Every day | ORAL | Status: DC
  Administered 2018-05-03: 45 mg via ORAL
  Filled 2018-05-03: qty 3

## 2018-05-03 MED ORDER — TRAZODONE HCL 100 MG PO TABS
300.0000 mg | ORAL_TABLET | Freq: Every day | ORAL | Status: DC
  Administered 2018-05-03: 300 mg via ORAL
  Filled 2018-05-03: qty 3

## 2018-05-03 MED ORDER — TRAZODONE HCL 100 MG PO TABS: 300.0000 mg | ORAL_TABLET | Freq: Once | ORAL | Status: DC

## 2018-05-03 MED ORDER — ACETAMINOPHEN 650 MG RE SUPP: 650.0000 mg | Freq: Four times a day (QID) | RECTAL | Status: DC | PRN

## 2018-05-03 MED ORDER — IOHEXOL 300 MG/ML  SOLN
100.0000 mL | Freq: Once | INTRAMUSCULAR | Status: AC | PRN
  Administered 2018-05-03: 100 mL via INTRAVENOUS

## 2018-05-03 MED ORDER — ACETAMINOPHEN 325 MG PO TABS
650.0000 mg | ORAL_TABLET | Freq: Four times a day (QID) | ORAL | Status: DC | PRN
  Filled 2018-05-03: qty 2

## 2018-05-03 MED ORDER — ENOXAPARIN SODIUM 40 MG/0.4ML ~~LOC~~ SOLN
40.0000 mg | Freq: Every day | SUBCUTANEOUS | Status: DC
  Administered 2018-05-03: 40 mg via SUBCUTANEOUS
  Filled 2018-05-03: qty 0.4

## 2018-05-03 MED ORDER — DIPHENHYDRAMINE HCL 50 MG/ML IJ SOLN
25.0000 mg | Freq: Once | INTRAMUSCULAR | Status: AC
  Administered 2018-05-03: 25 mg via INTRAVENOUS
  Filled 2018-05-03: qty 1

## 2018-05-03 MED ORDER — MIRTAZAPINE 15 MG PO TABS: 45.0000 mg | ORAL_TABLET | Freq: Once | ORAL | Status: DC

## 2018-05-03 MED ORDER — SODIUM CHLORIDE 0.9 % IV SOLN
INTRAVENOUS | Status: AC
  Administered 2018-05-03 – 2018-05-04 (×2): via INTRAVENOUS

## 2018-05-03 MED ORDER — METOCLOPRAMIDE HCL 5 MG/ML IJ SOLN
10.0000 mg | Freq: Four times a day (QID) | INTRAMUSCULAR | Status: DC
  Administered 2018-05-03 – 2018-05-04 (×2): 10 mg via INTRAVENOUS
  Filled 2018-05-03 (×3): qty 2

## 2018-05-03 MED ORDER — HALOPERIDOL LACTATE 5 MG/ML IJ SOLN
5.0000 mg | Freq: Once | INTRAMUSCULAR | Status: AC
  Administered 2018-05-03: 5 mg via INTRAVENOUS
  Filled 2018-05-03: qty 1

## 2018-05-03 MED ORDER — LOPERAMIDE HCL 2 MG PO CAPS: 2.0000 mg | ORAL_CAPSULE | ORAL | Status: DC | PRN

## 2018-05-03 MED ORDER — PROCHLORPERAZINE EDISYLATE 10 MG/2ML IJ SOLN
10.0000 mg | Freq: Once | INTRAMUSCULAR | Status: AC
  Administered 2018-05-03: 10 mg via INTRAVENOUS
  Filled 2018-05-03: qty 2

## 2018-05-03 NOTE — ED Notes (Signed)
ED TO INPATIENT HANDOFF REPORT  S Name/Age/Gender Haley Freeman 43 y.o. female Room/Bed: WA17/WA17  Code Status   Code Status: Prior  Home/SNF/Other Home Patient oriented to: self, place, time and situation Is this baseline? Yes   Triage Complete: Triage complete  Chief Complaint abd pain  Triage Note Per EMS, pt complains of abdomina pain w/ nausea, emesis, diarrhea. Hx of gastroparesis. Pt denies fever. Pain is 10/10. Pt received 50 mcg fentanyl, 4 mg Zofran en route.   Allergies Allergies  Allergen Reactions  . Penicillins Anaphylaxis    Swells throat. Has patient had a PCN reaction causing immediate rash, facial/tongue/throat swelling, SOB or lightheadedness with hypotension: Yes Has patient had a PCN reaction causing severe rash involving mucus membranes or skin necrosis: No Has patient had a PCN reaction that required hospitalization: Yes Has patient had a PCN reaction occurring within the last 10 years: No If all of the above answers are "NO", then may proceed with Cephalosporin use.   [name] (Diagnostic) Shortness Of Breath    Level of Care/Admitting Diagnosis ED Disposition    ED Disposition Condition Comment   Admit  Hospital Area: [facility] [[id number]]  Level of Care: Med-Surg [16]  Covid Evaluation: N/A  Diagnosis: Intractable nausea and vomiting [177939]  Admitting Physician: Shela Leff [[id number]]  Attending Physician: Shela Leff [[id number]]  PT Class (Do Not Modify): Observation [104]  PT Acc Code (Do Not Modify): Observation [10022]       B Medical/Surgery History Past Medical History:  Diagnosis Date  . Alcohol abuse   . Anxiety   . Depression   . Headache(784.0)   . Neuromuscular disorder [facility])    Past Surgical History:  Procedure Laterality Date  . CESAREAN SECTION  '99 and '08   x 2  . CHOLECYSTECTOMY N/A 10/01/2017   Procedure: LAPAROSCOPIC CHOLECYSTECTOMY WITH POSSIBLE  INTRAOPERATIVE  CHOLANGIOGRAM;  Surgeon: Stark Klein, MD;  Location: [facility];  Service: General;  Laterality: N/A;  . ESOPHAGOGASTRODUODENOSCOPY N/A 09/16/2017   Procedure: ESOPHAGOGASTRODUODENOSCOPY (EGD) WITH PROPOFOL;  Surgeon: Clarene Essex, MD;  Location: [place];  Service: Endoscopy;  Laterality: N/A;  . LAPAROSCOPY Right 08/22/2017   Procedure: LAPAROSCOPY OPERATIVE removal of right ovary and right dermoid cyst;  Surgeon: Sloan Leiter, MD;  Location: [facility];  Service: Gynecology;  Laterality: Right;     A IV Location/Drains/Wounds Patient Lines/Drains/Airways Status   Active Line/Drains/Airways    Name:   Placement date:   Placement time:   Site:   Days:   Peripheral IV 05/03/18 Left Antecubital   05/03/18    1906    Antecubital   less than 1          Intake/Output Last 24 hours  Intake/Output Summary (Last 24 hours) at 05/03/2018 2129 Last data filed at 05/03/2018 1746 Gross per 24 hour  Intake 1000 ml  Output -  Net 1000 ml    Labs/Imaging Results for orders placed or performed during the hospital encounter of 05/03/18 (from the past 48 hour(s))  Lipase, blood     Status: Abnormal   Collection Time: 05/03/18  1:08 PM  Result Value Ref Range   Lipase 56 (H) 11 - 51 U/L    Comment: Performed at [facility], [place] [address] [place], [place] [address]  Comprehensive metabolic panel     Status: Abnormal   Collection Time: 05/03/18  1:08 PM  Result Value Ref Range   Sodium 140 135 - 145 mmol/L  Potassium 3.7 3.5 - 5.1 mmol/L   Chloride 107 98 - 111 mmol/L   CO2 21 (L) 22 - 32 mmol/L   Glucose, Bld 118 (H) 70 - 99 mg/dL   BUN 14 6 - 20 mg/dL   Creatinine, Ser 1.00 0.44 - 1.00 mg/dL   Calcium 9.3 8.9 - 10.3 mg/dL   Total Protein 8.7 (H) 6.5 - 8.1 g/dL   Albumin 4.5 3.5 - 5.0 g/dL   AST 19 15 - 41 U/L   ALT 17 0 - 44 U/L   Alkaline Phosphatase 108 38 - 126 U/L   Total Bilirubin 0.2 (L) 0.3 - 1.2 mg/dL   GFR calc non Af Amer >60 >60 mL/min   GFR calc Af Amer  >60 >60 mL/min   Anion gap 12 5 - 15    Comment: Performed at [facility], [place] [address] [place], [place] [address]  CBC     Status: Abnormal   Collection Time: 05/03/18  1:08 PM  Result Value Ref Range   WBC 18.5 (H) 4.0 - 10.5 K/uL   RBC 4.98 3.87 - 5.11 MIL/uL   Hemoglobin 11.6 (L) 12.0 - 15.0 g/dL   HCT 38.8 36.0 - 46.0 %   MCV 77.9 (L) 80.0 - 100.0 fL   MCH 23.3 (L) 26.0 - 34.0 pg   MCHC 29.9 (L) 30.0 - 36.0 g/dL   RDW 19.2 (H) 11.5 - 15.5 %   Platelets 297 150 - 400 K/uL    Comment: REPEATED TO VERIFY SPECIMEN CHECKED FOR CLOTS    nRBC 0.0 0.0 - 0.2 %    Comment: Performed at [facility], [place] [address] [place], [place] [address]  Urinalysis, Routine w reflex microscopic     Status: Abnormal   Collection Time: 05/03/18  1:08 PM  Result Value Ref Range   Color, Urine YELLOW YELLOW   APPearance CLOUDY (A) CLEAR   Specific Gravity, Urine 1.039 (H) 1.005 - 1.030   pH 5.0 5.0 - 8.0   Glucose, UA NEGATIVE NEGATIVE mg/dL   Hgb urine dipstick NEGATIVE NEGATIVE   Bilirubin Urine NEGATIVE NEGATIVE   Ketones, ur 20 (A) NEGATIVE mg/dL   Protein, ur 30 (A) NEGATIVE mg/dL   Nitrite NEGATIVE NEGATIVE   Leukocytes,Ua NEGATIVE NEGATIVE   RBC / HPF 0-5 0 - 5 RBC/hpf   WBC, UA 6-10 0 - 5 WBC/hpf   Bacteria, UA RARE (A) NONE SEEN   Squamous Epithelial / LPF 0-5 0 - 5   Mucus PRESENT     Comment: Performed at [facility], [place] [address], [place],  [address]  I-Stat beta hCG blood, ED     Status: None   Collection Time: 05/03/18  3:57 PM  Result Value Ref Range   I-stat hCG, quantitative <5.0 <5 mIU/mL   Comment 3            Comment:   GEST. AGE      CONC.  (mIU/mL)   <=1 WEEK        5 - 50     2 WEEKS       50 - 500     3 WEEKS       100 - 10,000     4 WEEKS     1,000 - 30,000        FEMALE AND NON-PREGNANT FEMALE:     LESS THAN 5 mIU/mL    Ct Abdomen Pelvis W Contrast  Result Date: 05/03/2018  CLINICAL DATA:   Epigastric pain with nausea vomiting and diarrhea EXAM: CT ABDOMEN AND PELVIS WITH CONTRAST TECHNIQUE: Multidetector CT imaging of the abdomen and pelvis was performed using the standard protocol following bolus administration of intravenous contrast. CONTRAST:  123mL OMNIPAQUE IOHEXOL 300 MG/ML  SOLN COMPARISON:  CT 10/24/2017, 09/27/2017 FINDINGS: Lower chest: Lung bases demonstrate no acute consolidation or pleural effusion. Heart size within normal limits. Small hiatal hernia. Hepatobiliary: Vague hypodensity in the posterior right hepatic lobe as before. Status post cholecystectomy. No biliary enlargement Pancreas: Unremarkable. No pancreatic ductal dilatation or surrounding inflammatory changes. Spleen: Normal in size without focal abnormality. Adrenals/Urinary Tract: Right adrenal gland is normal. Probable fat containing left adrenal mass measuring 2 cm. Kidneys are normal, without renal calculi, focal lesion, or hydronephrosis. Bladder is unremarkable. Stomach/Bowel: Stomach is within normal limits. Appendix appears normal. No evidence of bowel wall thickening, distention, or inflammatory changes. Vascular/Lymphatic: No significant vascular findings are present. No enlarged abdominal or pelvic lymph nodes. Reproductive: Uterus and bilateral adnexa are unremarkable. Other: Negative for free air or free fluid. Small fat containing umbilical and periumbilical hernias. Musculoskeletal: No acute or significant osseous findings. IMPRESSION: 1. No CT evidence for acute intra-abdominal or pelvic abnormality. 2. Other chronic findings as previously described Electronically Signed   By: Donavan Foil M.D.   On: 05/03/2018 20:00    Pending Labs Unresulted Labs (From admission, onward)   None      Vitals/Pain Today's Vitals   05/03/18 1906 05/03/18 2035 05/03/18 2049 05/03/18 2100  BP: 138/88 121/81 133/79 128/84  Pulse: 88 84 89 93  Resp: 18 18  17   Temp:      TempSrc:      SpO2: 99% 100% 100% 100%   PainSc:        Isolation Precautions No active isolations  Medications Medications  sodium chloride flush (NS) 0.9 % injection 3 mL (3 mLs Intravenous Given 05/03/18 1550)  sodium chloride 0.9 % bolus 1,000 mL (0 mLs Intravenous Stopped 05/03/18 1746)  haloperidol lactate (HALDOL) injection 5 mg (5 mg Intravenous Given 05/03/18 1551)  iohexol (OMNIPAQUE) 300 MG/ML solution 100 mL (100 mLs Intravenous Contrast Given 05/03/18 1809)  sodium chloride (PF) 0.9 % injection (10 mLs  Given 05/03/18 2055)  prochlorperazine (COMPAZINE) injection 10 mg (10 mg Intravenous Given 05/03/18 2028)  sodium chloride 0.9 % bolus 1,000 mL (1,000 mLs Intravenous New Bag/Given 05/03/18 2048)  diphenhydrAMINE (BENADRYL) injection 25 mg (25 mg Intravenous Given 05/03/18 2029)    Mobility walks     Focused Assessments    R Recommendations: See Admitting Provider Note  Report given to:

## 2018-05-03 NOTE — ED Notes (Signed)
Patient transported to CT 

## 2018-05-03 NOTE — ED Provider Notes (Signed)
[facility] DEPT Provider Note   CSN: [id number] Arrival date & time: 05/03/18  1223    History   Chief Complaint Chief Complaint  Patient presents with   Abdominal Pain   Emesis   Diarrhea    HPI Lucero C Klingensmith-Epps is a 43 y.o. female.     HPI   43 year old female with a history of cyclic vomiting syndrome presents with concern for nausea, vomiting and diarrhea.  She had been seen on April 17 as well as April 19 with similar symptoms.  Reports this has been going on for approximately 1 week.  She felt some mild improvement at first, however over the last several days her symptoms have worsened.  Reports vomiting and diarrhea over 10 times per day.  Reports that she has tried Phenergan as well as Phenergan suppositories, but is unable to keep down the medications, and due to diarrhea she is unable to keep the suppositories in long enough to have an effect.  Reports severe left upper quadrant pain, where she reports it feels like someone is hitting her out from the inside.  Denies black or bloody stools, recent antibiotics, known sick contacts.  She denies fever, cough, shortness of breath, urinary symptoms.  Pain currently is severe.  Denies recent alcohol use, does not denies NSAID use, and denies recent use of marijuana.  Past Medical History:  Diagnosis Date   Alcohol abuse    Anxiety    Depression    Headache(784.0)    Neuromuscular disorder [facility])     Patient Active Problem List   Diagnosis Date Noted   Cyclic vomiting syndrome 11/01/2017   Epigastric pain    Nausea & vomiting 10/24/2017   Hypokalemia 09/27/2017   Elevated LFTs 09/27/2017   Abnormal biliary HIDA scan 09/27/2017   Marijuana abuse 09/27/2017   Intractable nausea and vomiting 09/26/2017   Cannabis hyperemesis syndrome concurrent with and due to cannabis abuse ([place]) 09/16/2017   Nausea and vomiting 09/06/2017   Dermoid 08/22/2017    Past Surgical  History:  Procedure Laterality Date   CESAREAN SECTION  '99 and '08   x 2   CHOLECYSTECTOMY N/A 10/01/2017   Procedure: LAPAROSCOPIC CHOLECYSTECTOMY WITH POSSIBLE  INTRAOPERATIVE CHOLANGIOGRAM;  Surgeon: Stark Klein, MD;  Location[facility];  Service: General;  Laterality: N/A;   ESOPHAGOGASTRODUODENOSCOPY N/A 09/16/2017   Procedure: ESOPHAGOGASTRODUODENOSCOPY (EGD) WITH PROPOFOL;  Surgeon: Clarene Essex, MD;  Location: [facility];  Service: Endoscopy;  Laterality: N/A;   LAPAROSCOPY Right 08/22/2017   Procedure: LAPAROSCOPY OPERATIVE removal of right ovary and right dermoid cyst;  Surgeon: Sloan Leiter, MD;  Location: [facility];  Service: Gynecology;  Laterality: Right;     OB History    Gravida  3   Para  3   Term      Preterm      AB      Living  3     SAB      TAB      Ectopic      Multiple      Live Births               Home Medications    Prior to Admission medications   Medication Sig Start Date End Date Taking? Authorizing Provider  hydrOXYzine (ATARAX/VISTARIL) 25 MG tablet Take 50 mg by mouth at bedtime as needed for nausea or vomiting.  09/20/17   [provider]  LORazepam (ATIVAN) 1 MG tablet Take 1 tablet (1 mg  total) by mouth every 6 (six) hours as needed for anxiety (nausea/vomiting). 04/26/18   Julianne Rice, MD  methylPREDNISolone (MEDROL DOSEPAK) 4 MG TBPK tablet tad Patient not taking: Reported on 04/26/2018 03/08/18   Raylene Everts, MD  metoCLOPramide (REGLAN) 5 MG tablet Take 1 tablet (5 mg total) by mouth 3 (three) times daily before meals. 10/30/17   Charlynne Cousins, MD  mirtazapine (REMERON) 45 MG tablet Take 45 mg by mouth at bedtime.  10/16/17   [provider]  pantoprazole (PROTONIX) 40 MG tablet Take 1 tablet (40 mg total) by mouth daily. 04/24/18   Sherwood Gambler, MD  promethazine (PHENERGAN) 25 MG suppository Place 1 suppository (25 mg total) rectally every 6 (six) hours as needed for nausea or vomiting.  04/26/18   Julianne Rice, MD  simethicone (GAS-X) 80 MG chewable tablet Chew 1 tablet (80 mg total) by mouth 4 (four) times daily as needed for flatulence. 10/30/17 10/30/18  Charlynne Cousins, MD  sucralfate (CARAFATE) 1 g tablet Take 1 tablet (1 g total) by mouth 4 (four) times daily -  with meals and at bedtime. 04/26/18   Julianne Rice, MD  traZODone (DESYREL) 100 MG tablet Take 300 mg by mouth at bedtime.     [provider]    Family History Family History  Problem Relation Age of Onset   Hypertension Mother     Social History Social History   Tobacco Use   Smoking status: Former Smoker    Packs/day: 0.50   Smokeless tobacco: Never Used  Substance Use Topics   Alcohol use: No    Comment: occasionally   Drug use: Yes    Types: Other-see comments, Marijuana    Comment: Occasionally smokes marijuana      Allergies   Penicillins and Strawberry (diagnostic)   Review of Systems Review of Systems   Physical Exam Updated Vital Signs BP 133/79    Pulse 89    Temp 98.2 F (36.8 C) (Oral)    Resp 18    SpO2 100%   Physical Exam Vitals signs and nursing note reviewed.  Constitutional:      General: She is not in acute distress.    Appearance: She is well-developed. She is not diaphoretic.  HENT:     Head: Normocephalic and atraumatic.  Eyes:     Conjunctiva/sclera: Conjunctivae normal.  Neck:     Musculoskeletal: Normal range of motion.  Cardiovascular:     Rate and Rhythm: Normal rate and regular rhythm.     Heart sounds: Normal heart sounds.  Pulmonary:     Effort: Pulmonary effort is normal. No respiratory distress.  Abdominal:     General: There is no distension.     Palpations: Abdomen is soft.     Tenderness: There is abdominal tenderness in the left upper quadrant and left lower quadrant. There is no guarding.  Musculoskeletal:        General: No tenderness.  Skin:    General: Skin is warm and dry.     Findings: No erythema or  rash.  Neurological:     Mental Status: She is alert and oriented to person, place, and time.      ED Treatments / Results  Labs (all labs ordered are listed, but only abnormal results are displayed) Labs Reviewed  LIPASE, BLOOD - Abnormal; Notable for the following components:      Result Value   Lipase 56 (*)    All other components within normal limits  COMPREHENSIVE METABOLIC PANEL - Abnormal; Notable for the following components:   CO2 21 (*)    Glucose, Bld 118 (*)    Total Protein 8.7 (*)    Total Bilirubin 0.2 (*)    All other components within normal limits  CBC - Abnormal; Notable for the following components:   WBC 18.5 (*)    Hemoglobin 11.6 (*)    MCV 77.9 (*)    MCH 23.3 (*)    MCHC 29.9 (*)    RDW 19.2 (*)    All other components within normal limits  URINALYSIS, ROUTINE W REFLEX MICROSCOPIC - Abnormal; Notable for the following components:   APPearance CLOUDY (*)    Specific Gravity, Urine 1.039 (*)    Ketones, ur 20 (*)    Protein, ur 30 (*)    Bacteria, UA RARE (*)    All other components within normal limits  I-STAT BETA HCG BLOOD, ED ([facility], [facility], [facility] ONLY)    EKG None  Radiology Ct Abdomen Pelvis W Contrast  Result Date: 05/03/2018 CLINICAL DATA:  Epigastric pain with nausea vomiting and diarrhea EXAM: CT ABDOMEN AND PELVIS WITH CONTRAST TECHNIQUE: Multidetector CT imaging of the abdomen and pelvis was performed using the standard protocol following bolus administration of intravenous contrast. CONTRAST:  142mL OMNIPAQUE IOHEXOL 300 MG/ML  SOLN COMPARISON:  CT 10/24/2017, 09/27/2017 FINDINGS: Lower chest: Lung bases demonstrate no acute consolidation or pleural effusion. Heart size within normal limits. Small hiatal hernia. Hepatobiliary: Vague hypodensity in the posterior right hepatic lobe as before. Status post cholecystectomy. No biliary enlargement Pancreas: Unremarkable. No pancreatic ductal dilatation or surrounding inflammatory changes. Spleen:  Normal in size without focal abnormality. Adrenals/Urinary Tract: Right adrenal gland is normal. Probable fat containing left adrenal mass measuring 2 cm. Kidneys are normal, without renal calculi, focal lesion, or hydronephrosis. Bladder is unremarkable. Stomach/Bowel: Stomach is within normal limits. Appendix appears normal. No evidence of bowel wall thickening, distention, or inflammatory changes. Vascular/Lymphatic: No significant vascular findings are present. No enlarged abdominal or pelvic lymph nodes. Reproductive: Uterus and bilateral adnexa are unremarkable. Other: Negative for free air or free fluid. Small fat containing umbilical and periumbilical hernias. Musculoskeletal: No acute or significant osseous findings. IMPRESSION: 1. No CT evidence for acute intra-abdominal or pelvic abnormality. 2. Other chronic findings as previously described Electronically Signed   By: Donavan Foil M.D.   On: 05/03/2018 20:00    Procedures Procedures (including critical care time)  Medications Ordered in ED Medications  sodium chloride flush (NS) 0.9 % injection 3 mL (3 mLs Intravenous Given 05/03/18 1550)  sodium chloride 0.9 % bolus 1,000 mL (0 mLs Intravenous Stopped 05/03/18 1746)  haloperidol lactate (HALDOL) injection 5 mg (5 mg Intravenous Given 05/03/18 1551)  iohexol (OMNIPAQUE) 300 MG/ML solution 100 mL (100 mLs Intravenous Contrast Given 05/03/18 1809)  sodium chloride (PF) 0.9 % injection (10 mLs  Given 05/03/18 2055)  prochlorperazine (COMPAZINE) injection 10 mg (10 mg Intravenous Given 05/03/18 2028)  sodium chloride 0.9 % bolus 1,000 mL (1,000 mLs Intravenous New Bag/Given 05/03/18 2048)  diphenhydrAMINE (BENADRYL) injection 25 mg (25 mg Intravenous Given 05/03/18 2029)     Initial Impression / Assessment and Plan / ED Course  I have reviewed the triage vital signs and the nursing notes.  Pertinent labs & imaging results that were available during my care of the patient were reviewed by me  and considered in my medical decision making (see chart for details).        43 year old female  with a history of cyclic vomiting syndrome presents with concern for nausea, vomiting and diarrhea.  She had been seen on April 17 as well as April 19 with similar symptoms.  Differential diagnoses include gastroparesis, cannabinoid hyperemesis, other cyclic vomiting, pyelonephritis, small bowel obstruction, pancreatitis, diverticulitis, gastroenteritis.  Given severe pain, continuing symptoms, CT abdomen pelvis was ordered which showed no evidence of diverticulitis or obstruction.  Urinalysis shows no sign of UTI.  Pregnancy test is negative. Labs and vital signs without significant abnormalities.   Suspect likely cyclic vomiting or gastroparesis as etiology of symptoms, with gastroenteritis also possible.  Does not have risk factors for C. difficile.  She is given IV fluids and Haldol with initial slight improvement, however had continued vomiting.  Given she is tried both rectal and p.o. medications at home, and has a continued emesis in the emergency department, will admit for intractable vomiting and for further nausea control.  She is given additional additional IV fluids, Compazine.  Final Clinical Impressions(s) / ED Diagnoses   Final diagnoses:  Intractable vomiting with nausea, unspecified vomiting type  Gastroparesis    ED Discharge Orders    None       Gareth Morgan, MD 05/03/18 2111

## 2018-05-03 NOTE — H&P (Signed)
History and Physical    Haley Freeman HOZ:[id number] DOB: 1975-05-26 DOA: 05/03/2018  PCP: Clent Demark, PA-C Patient coming from: Home  Chief Complaint: Nausea and vomiting  HPI: Haley Freeman is a 43 y.o. female with medical history significant of cyclic vomiting syndrome secondary to cannabis use, gastroparesis, anxiety, depression, headaches, and conditions listed below presenting to the hospital for evaluation of nausea and vomiting.  Patient was seen in the ED on April 17 for the same problem and discharged home with Phenergan and Protonix. Patient reports 10-day history of intractable nausea and vomiting.  Symptoms have been getting progressively worse and for the past 2 days she has not been able to keep food down.  She has also been having nonbloody, watery diarrhea for the past 2 days.  Complaining of intermittent epigastric abdominal pain.  States she smokes marijuana and has been using it recently to help with the nausea and vomiting.  States she has gastroparesis but has not been able to keep Reglan down due to vomiting.  Denies any recent antibiotic use.  Denies any fevers, chills, chest pain, shortness of breath, cough, sick contacts, or exposure to any individual with suspected or confirmed COVID-19.  Denies dysuria, urinary frequency, or urgency.  States she drinks alcohol only socially and has not had any alcoholic beverages for the past 10 days.  Review of Systems: As per HPI otherwise 10 point review of systems negative.  Past Medical History:  Diagnosis Date   Alcohol abuse    Anxiety    Chronic anemia 05/04/2018   Depression    Headache(784.0)    Neuromuscular disorder [facility])     Past Surgical History:  Procedure Laterality Date   CESAREAN SECTION  '99 and '08   x 2   CHOLECYSTECTOMY N/A 10/01/2017   Procedure: LAPAROSCOPIC CHOLECYSTECTOMY WITH POSSIBLE  INTRAOPERATIVE CHOLANGIOGRAM;  Surgeon: Stark Klein, MD;  Location: [facility];   Service: General;  Laterality: N/A;   ESOPHAGOGASTRODUODENOSCOPY N/A 09/16/2017   Procedure: ESOPHAGOGASTRODUODENOSCOPY (EGD) WITH PROPOFOL;  Surgeon: Clarene Essex, MD;  Location: [facility];  Service: Endoscopy;  Laterality: N/A;   LAPAROSCOPY Right 08/22/2017   Procedure: LAPAROSCOPY OPERATIVE removal of right ovary and right dermoid cyst;  Surgeon: Sloan Leiter, MD;  Location: [facility];  Service: Gynecology;  Laterality: Right;     reports that she has quit smoking. She smoked 0.50 packs per day. She has never used smokeless tobacco. She reports current drug use. Drugs: Other-see comments and Marijuana. She reports that she does not drink alcohol.  Allergies  Allergen Reactions   Penicillins Anaphylaxis    Swells throat. Has patient had a PCN reaction causing immediate rash, facial/tongue/throat swelling, SOB or lightheadedness with hypotension: Yes Has patient had a PCN reaction causing severe rash involving mucus membranes or skin necrosis: No Has patient had a PCN reaction that required hospitalization: Yes Has patient had a PCN reaction occurring within the last 10 years: No If all of the above answers are "NO", then may proceed with Cephalosporin use.    Strawberry (Diagnostic) Shortness Of Breath    Family History  Problem Relation Age of Onset   Hypertension Mother     Prior to Admission medications   Medication Sig Start Date End Date Taking? Authorizing Provider  hydrOXYzine (ATARAX/VISTARIL) 25 MG tablet Take 50 mg by mouth at bedtime as needed for nausea or vomiting.  09/20/17   [provider]  LORazepam (ATIVAN) 1 MG tablet Take 1 tablet (1 mg total)  by mouth every 6 (six) hours as needed for anxiety (nausea/vomiting). 04/26/18   Julianne Rice, MD  methylPREDNISolone (MEDROL DOSEPAK) 4 MG TBPK tablet tad Patient not taking: Reported on 04/26/2018 03/08/18   Raylene Everts, MD  metoCLOPramide (REGLAN) 5 MG tablet Take 1 tablet (5 mg total) by mouth 3  (three) times daily before meals. 10/30/17   Charlynne Cousins, MD  mirtazapine (REMERON) 45 MG tablet Take 45 mg by mouth at bedtime.  10/16/17   [provider]  pantoprazole (PROTONIX) 40 MG tablet Take 1 tablet (40 mg total) by mouth daily. 04/24/18   Sherwood Gambler, MD  promethazine (PHENERGAN) 25 MG suppository Place 1 suppository (25 mg total) rectally every 6 (six) hours as needed for nausea or vomiting. 04/26/18   Julianne Rice, MD  simethicone (GAS-X) 80 MG chewable tablet Chew 1 tablet (80 mg total) by mouth 4 (four) times daily as needed for flatulence. 10/30/17 10/30/18  Charlynne Cousins, MD  sucralfate (CARAFATE) 1 g tablet Take 1 tablet (1 g total) by mouth 4 (four) times daily -  with meals and at bedtime. 04/26/18   Julianne Rice, MD  traZODone (DESYREL) 100 MG tablet Take 300 mg by mouth at bedtime.     [provider]    Physical Exam: Vitals:   05/03/18 2035 05/03/18 2049 05/03/18 2100 05/03/18 2149  BP: 121/81 133/79 128/84 124/88  Pulse: 84 89 93 98  Resp: 18  17 17   Temp:    98.7 F (37.1 C)  TempSrc:    Oral  SpO2: 100% 100% 100% 97%    Physical Exam  Constitutional: She is oriented to person, place, and time. She appears well-developed and well-nourished. No distress.  HENT:  Head: Normocephalic.  Slightly dry mucous membranes  Eyes: Right eye exhibits no discharge. Left eye exhibits no discharge.  Neck: Neck supple.  Cardiovascular: Normal rate, regular rhythm and intact distal pulses.  Pulmonary/Chest: Effort normal and breath sounds normal. No respiratory distress. She has no wheezes. She has no rales.  Abdominal: Soft. Bowel sounds are normal. She exhibits no distension. There is abdominal tenderness. There is no rebound and no guarding.  Left upper and lower quadrants tender to palpation  Musculoskeletal:        General: No edema.  Neurological: She is alert and oriented to person, place, and time.  Skin: Skin is warm and  dry. She is not diaphoretic.     Labs on Admission: I have personally reviewed following labs and imaging studies  CBC: Recent Labs  Lab 05/03/18 1308  WBC 18.5*  NEUTROABS 16.6*  HGB 11.6*  HCT 38.8  MCV 77.9*  PLT 062   Basic Metabolic Panel: Recent Labs  Lab 05/03/18 1308  NA 140  K 3.7  CL 107  CO2 21*  GLUCOSE 118*  BUN 14  CREATININE 1.00  CALCIUM 9.3   GFR: Estimated Creatinine Clearance: 106.3 mL/min (by C-G formula based on SCr of 1 mg/dL). Liver Function Tests: Recent Labs  Lab 05/03/18 1308  AST 19  ALT 17  ALKPHOS 108  BILITOT 0.2*  PROT 8.7*  ALBUMIN 4.5   Recent Labs  Lab 05/03/18 1308  LIPASE 56*   No results for input(s): AMMONIA in the last 168 hours. Coagulation Profile: No results for input(s): INR, PROTIME in the last 168 hours. Cardiac Enzymes: No results for input(s): CKTOTAL, CKMB, CKMBINDEX, TROPONINI in the last 168 hours. BNP (last 3 results) No results for input(s): PROBNP in the  last 8760 hours. HbA1C: No results for input(s): HGBA1C in the last 72 hours. CBG: No results for input(s): GLUCAP in the last 168 hours. Lipid Profile: No results for input(s): CHOL, HDL, LDLCALC, TRIG, CHOLHDL, LDLDIRECT in the last 72 hours. Thyroid Function Tests: No results for input(s): TSH, T4TOTAL, FREET4, T3FREE, THYROIDAB in the last 72 hours. Anemia Panel: No results for input(s): VITAMINB12, FOLATE, FERRITIN, TIBC, IRON, RETICCTPCT in the last 72 hours. Urine analysis:    Component Value Date/Time   COLORURINE YELLOW 05/03/2018 1308   APPEARANCEUR CLOUDY (A) 05/03/2018 1308   LABSPEC 1.039 (H) 05/03/2018 1308   PHURINE 5.0 05/03/2018 1308   GLUCOSEU NEGATIVE 05/03/2018 1308   HGBUR NEGATIVE 05/03/2018 1308   BILIRUBINUR NEGATIVE 05/03/2018 1308   KETONESUR 20 (A) 05/03/2018 1308   PROTEINUR 30 (A) 05/03/2018 1308   UROBILINOGEN 0.2 09/22/2014 0730   NITRITE NEGATIVE 05/03/2018 1308   LEUKOCYTESUR NEGATIVE 05/03/2018 1308     Radiological Exams on Admission: Ct Abdomen Pelvis W Contrast  Result Date: 05/03/2018 CLINICAL DATA:  Epigastric pain with nausea vomiting and diarrhea EXAM: CT ABDOMEN AND PELVIS WITH CONTRAST TECHNIQUE: Multidetector CT imaging of the abdomen and pelvis was performed using the standard protocol following bolus administration of intravenous contrast. CONTRAST:  16mL OMNIPAQUE IOHEXOL 300 MG/ML  SOLN COMPARISON:  CT 10/24/2017, 09/27/2017 FINDINGS: Lower chest: Lung bases demonstrate no acute consolidation or pleural effusion. Heart size within normal limits. Small hiatal hernia. Hepatobiliary: Vague hypodensity in the posterior right hepatic lobe as before. Status post cholecystectomy. No biliary enlargement Pancreas: Unremarkable. No pancreatic ductal dilatation or surrounding inflammatory changes. Spleen: Normal in size without focal abnormality. Adrenals/Urinary Tract: Right adrenal gland is normal. Probable fat containing left adrenal mass measuring 2 cm. Kidneys are normal, without renal calculi, focal lesion, or hydronephrosis. Bladder is unremarkable. Stomach/Bowel: Stomach is within normal limits. Appendix appears normal. No evidence of bowel wall thickening, distention, or inflammatory changes. Vascular/Lymphatic: No significant vascular findings are present. No enlarged abdominal or pelvic lymph nodes. Reproductive: Uterus and bilateral adnexa are unremarkable. Other: Negative for free air or free fluid. Small fat containing umbilical and periumbilical hernias. Musculoskeletal: No acute or significant osseous findings. IMPRESSION: 1. No CT evidence for acute intra-abdominal or pelvic abnormality. 2. Other chronic findings as previously described Electronically Signed   By: Donavan Foil M.D.   On: 05/03/2018 20:00    Assessment/Plan Principal Problem:   Intractable nausea and vomiting Active Problems:   Cannabis hyperemesis syndrome concurrent with and due to cannabis abuse (HCC)    Gastroparesis   Diarrhea   Chronic anemia   Intractable nausea and vomiting, suspect secondary to gastroparesis and cannabis hyperemesis syndrome due to ongoing cannabis use Afebrile.  White count 16.1 with neutrophilic predominance, unchanged from labs done a week ago.  White count seems to be chronically elevated per review of labs since August 2019.  LFTs normal.  Lipase grossly normal (56).  Beta hCG negative.  UA with 6-10 WBCs, rare bacteria, negative leukocytes, and negative nitrite.  Patient is not endorsing any UTI symptoms.  CT abdomen pelvis negative for acute finding.  Pancreas unremarkable.  No pancreatic ductal dilatation or surrounding inflammatory changes.  Status post cholecystectomy.  No biliary enlargement.  No evidence of bowel wall thickening, distention, or inflammatory changes.  Patient received Benadryl, Haldol, Compazine, and 2 L IV fluid boluses in the ED. -IV fluid hydration -IV Reglan 10 mg every 6 hours -IV PPI -Tylenol PRN -Will not order UDS as patient admits  to ongoing cannabis use. -Continue to monitor CBC  Diarrhea Patient reports 2-day history of watery diarrhea.  Denies recent antibiotic use.  Afebrile.  Leukocytosis is chronic.  CT abdomen pelvis negative for acute finding.  No diarrhea since she has been in the hospital. -Loperamide as needed -GI pathogen panel  Chronic microcytic anemia Hemoglobin 11.6, baseline ranging from 9.5-12.4 in the past 6 months.  No signs of active bleeding. -Check iron, ferritin, TIBC  DVT prophylaxis: Lovenox Code Status: Full code Family Communication: No family available. Disposition Plan: Anticipate discharge after clinical improvement. Consults called: None Admission status: Observation, MedSurg  This chart was dictated using voice recognition software.  Despite best efforts to proofread, errors can occur which can change the documentation meaning.  Shela Leff MD [facility] Pager [phone]  If 7PM-7AM, please contact night-coverage [email or web address] Password [email or web address]  05/04/2018, 1:30 AM

## 2018-05-03 NOTE — ED Notes (Signed)
Hospitalist at bedside 

## 2018-05-03 NOTE — ED Triage Notes (Signed)
Per EMS, pt complains of abdomina pain w/ nausea, emesis, diarrhea. Hx of gastroparesis. Pt denies fever. Pain is 10/10. Pt received 50 mcg fentanyl, 4 mg Zofran en route.

## 2018-05-04 ENCOUNTER — Encounter (HOSPITAL_COMMUNITY): Payer: Self-pay | Admitting: Internal Medicine

## 2018-05-04 DIAGNOSIS — K3184 Gastroparesis: Secondary | ICD-10-CM

## 2018-05-04 DIAGNOSIS — D649 Anemia, unspecified: Secondary | ICD-10-CM

## 2018-05-04 DIAGNOSIS — R112 Nausea with vomiting, unspecified: Secondary | ICD-10-CM | POA: Diagnosis not present

## 2018-05-04 DIAGNOSIS — R197 Diarrhea, unspecified: Secondary | ICD-10-CM

## 2018-05-04 HISTORY — DX: Anemia, unspecified: D64.9

## 2018-05-04 LAB — GASTROINTESTINAL PANEL BY PCR, STOOL (REPLACES STOOL CULTURE)

## 2018-05-04 LAB — CBC
HCT: 36.2 % (ref 36.0–46.0)
Hemoglobin: 10.6 g/dL — ABNORMAL LOW (ref 12.0–15.0)
MCH: 23 pg — ABNORMAL LOW (ref 26.0–34.0)
MCHC: 29.3 g/dL — ABNORMAL LOW (ref 30.0–36.0)
MCV: 78.7 fL — ABNORMAL LOW (ref 80.0–100.0)
Platelets: 323 10*3/uL (ref 150–400)
RBC: 4.6 MIL/uL (ref 3.87–5.11)
RDW: 19.1 % — ABNORMAL HIGH (ref 11.5–15.5)
WBC: 17.2 10*3/uL — ABNORMAL HIGH (ref 4.0–10.5)
nRBC: 0 % (ref 0.0–0.2)

## 2018-05-04 LAB — IRON AND TIBC
Iron: 16 ug/dL — ABNORMAL LOW (ref 28–170)
Saturation Ratios: 3 % — ABNORMAL LOW (ref 10.4–31.8)
TIBC: 482 ug/dL — ABNORMAL HIGH (ref 250–450)
UIBC: 466 ug/dL

## 2018-05-04 LAB — FERRITIN: Ferritin: 6 ng/mL — ABNORMAL LOW (ref 11–307)

## 2018-05-04 MED ORDER — FERROUS SULFATE 325 (65 FE) MG PO TABS: 325.0000 mg | ORAL_TABLET | Freq: Every day | ORAL | Status: DC

## 2018-05-04 MED ORDER — ONDANSETRON HCL 4 MG/2ML IJ SOLN
4.0000 mg | Freq: Four times a day (QID) | INTRAMUSCULAR | Status: DC | PRN
  Administered 2018-05-04: 4 mg via INTRAVENOUS
  Filled 2018-05-04: qty 2

## 2018-05-04 MED ORDER — SODIUM CHLORIDE 0.9 % IV SOLN: INTRAVENOUS | Status: DC

## 2018-05-04 MED ORDER — PANTOPRAZOLE SODIUM 40 MG PO TBEC
40.0000 mg | DELAYED_RELEASE_TABLET | Freq: Every day | ORAL | Status: DC
  Administered 2018-05-04: 40 mg via ORAL
  Filled 2018-05-04: qty 1

## 2018-05-04 MED ORDER — PROMETHAZINE HCL 25 MG/ML IJ SOLN
12.5000 mg | Freq: Once | INTRAMUSCULAR | Status: AC
  Administered 2018-05-04: 12.5 mg via INTRAVENOUS
  Filled 2018-05-04: qty 1

## 2018-05-04 MED ORDER — FERROUS SULFATE 325 (65 FE) MG PO TABS: 325.0000 mg | ORAL_TABLET | Freq: Every day | ORAL | 0 refills | Status: DC

## 2018-05-04 NOTE — Discharge Summary (Signed)
Physician Discharge Summary  Haley Freeman:[id number] DOB: 02/14/1975 DOA: 05/03/2018  PCP: Clent Demark, PA-C  Admit date: 05/03/2018 Discharge date: 05/04/2018  Admitted From: Home Disposition:  Home  Discharge Condition:Stable CODE STATUS:FULL Diet recommendation:Regular    Brief/Interim Summary:  Patient is a 43 year old female with history of gastroparesis, anxiety, depression who presented to the emergency department with complaints of nausea and vomiting.  She gave history of intractable nausea and vomiting for last 10 days.  Patient states he takes marijuana that helps with her nausea.  Patient was admitted for intractable nausea and vomiting and inability to tolerate orally. This morning she is hemodynamically stable.  Denies any nausea, vomiting or abdominal pain during my evaluation.  Iron studies shows low iron.  She gives report of menorrhagia.  Other lab parameters are fine.  She is stable for discharge to home today.  Counseled for marijuana cessation.  Following problems were addressed during her hospitalization:  Intractable nausea/vomiting: Was having nausea and vomiting for last 10 days.  Feels better this morning.    Gastroparesis: Has history of gastroparesis.  Takes Reglan at home.  Unknown etiology.  No history of diabetes.  Denies any abdominal pain this morning.  Diarrhea: Apparently has resolved.  2 days history of watery diarrhea.  Denies any antibiotic use.  CT abdomen//pelvis did not show any acute intra-abdominal findings.  Marijuana abuse: Also suspected cannabis hyperemesis syndrome due to ongoing cannabis abuse.  She states he takes cannabis to alleviate her nausea.  Counseled for cessation.  Chronic microcytic anemia: Hemoglobin in the range of 10.  Iron studies  shows low iron and ferritin.  Will start on supplementation.  Leukocytosis: Most likely reactive.  Check CBC in a week.   Discharge Diagnoses:  Principal Problem:    Intractable nausea and vomiting Active Problems:   Cannabis hyperemesis syndrome concurrent with and due to cannabis abuse (HCC)   Gastroparesis   Diarrhea   Chronic anemia    Discharge Instructions  Discharge Instructions    Diet - low sodium heart healthy   Complete by:  As directed    Discharge instructions   Complete by:  As directed    1)Take prescribed medications as instructed. 2)Follow up with your PCP in 1-2 weeks. Check CBC during the follow up.   Increase activity slowly   Complete by:  As directed      Allergies as of 05/04/2018      Reactions   Penicillins Anaphylaxis   Swells throat. Has patient had a PCN reaction causing immediate rash, facial/tongue/throat swelling, SOB or lightheadedness with hypotension: Yes Has patient had a PCN reaction causing severe rash involving mucus membranes or skin necrosis: No Has patient had a PCN reaction that required hospitalization: Yes Has patient had a PCN reaction occurring within the last 10 years: No If all of the above answers are "NO", then may proceed with Cephalosporin use.   Strawberry (diagnostic) Shortness Of Breath      Medication List    STOP taking these medications   LORazepam 1 MG tablet Commonly known as:  Ativan   methylPREDNISolone 4 MG Tbpk tablet Commonly known as:  MEDROL DOSEPAK     TAKE these medications   ferrous sulfate 325 (65 FE) MG tablet Take 1 tablet (325 mg total) by mouth daily with breakfast. Start taking on:  May 05, 2018   hydrOXYzine 25 MG tablet Commonly known as:  ATARAX/VISTARIL Take 50 mg by mouth at bedtime as needed for nausea  or vomiting.   metoCLOPramide 5 MG tablet Commonly known as:  REGLAN Take 1 tablet (5 mg total) by mouth 3 (three) times daily before meals.   mirtazapine 45 MG tablet Commonly known as:  REMERON Take 45 mg by mouth at bedtime.   pantoprazole 40 MG tablet Commonly known as:  Protonix Take 1 tablet (40 mg total) by mouth daily.    promethazine 25 MG suppository Commonly known as:  PHENERGAN Place 1 suppository (25 mg total) rectally every 6 (six) hours as needed for nausea or vomiting.   simethicone 80 MG chewable tablet Commonly known as:  Gas-X Chew 1 tablet (80 mg total) by mouth 4 (four) times daily as needed for flatulence.   sucralfate 1 g tablet Commonly known as:  Carafate Take 1 tablet (1 g total) by mouth 4 (four) times daily -  with meals and at bedtime.   traZODone 100 MG tablet Commonly known as:  DESYREL Take 300 mg by mouth at bedtime.      Follow-up Information    Clent Demark, PA-C. Schedule an appointment as soon as possible for a visit in 1 week(s).   Specialty:  Physician Assistant Contact information: [place] [address] [phone]          Allergies  Allergen Reactions  . Penicillins Anaphylaxis    Swells throat. Has patient had a PCN reaction causing immediate rash, facial/tongue/throat swelling, SOB or lightheadedness with hypotension: Yes Has patient had a PCN reaction causing severe rash involving mucus membranes or skin necrosis: No Has patient had a PCN reaction that required hospitalization: Yes Has patient had a PCN reaction occurring within the last 10 years: No If all of the above answers are "NO", then may proceed with Cephalosporin use.   [name] (Diagnostic) Shortness Of Breath    Consultations:  None   Procedures/Studies: Ct Abdomen Pelvis W Contrast  Result Date: 05/03/2018 CLINICAL DATA:  Epigastric pain with nausea vomiting and diarrhea EXAM: CT ABDOMEN AND PELVIS WITH CONTRAST TECHNIQUE: Multidetector CT imaging of the abdomen and pelvis was performed using the standard protocol following bolus administration of intravenous contrast. CONTRAST:  134mL OMNIPAQUE IOHEXOL 300 MG/ML  SOLN COMPARISON:  CT 10/24/2017, 09/27/2017 FINDINGS: Lower chest: Lung bases demonstrate no acute consolidation or pleural effusion. Heart  size within normal limits. Small hiatal hernia. Hepatobiliary: Vague hypodensity in the posterior right hepatic lobe as before. Status post cholecystectomy. No biliary enlargement Pancreas: Unremarkable. No pancreatic ductal dilatation or surrounding inflammatory changes. Spleen: Normal in size without focal abnormality. Adrenals/Urinary Tract: Right adrenal gland is normal. Probable fat containing left adrenal mass measuring 2 cm. Kidneys are normal, without renal calculi, focal lesion, or hydronephrosis. Bladder is unremarkable. Stomach/Bowel: Stomach is within normal limits. Appendix appears normal. No evidence of bowel wall thickening, distention, or inflammatory changes. Vascular/Lymphatic: No significant vascular findings are present. No enlarged abdominal or pelvic lymph nodes. Reproductive: Uterus and bilateral adnexa are unremarkable. Other: Negative for free air or free fluid. Small fat containing umbilical and periumbilical hernias. Musculoskeletal: No acute or significant osseous findings. IMPRESSION: 1. No CT evidence for acute intra-abdominal or pelvic abnormality. 2. Other chronic findings as previously described Electronically Signed   By: Donavan Foil M.D.   On: 05/03/2018 20:00       Discharge Exam: Vitals:   05/03/18 2149 05/04/18 0559  BP: 124/88 (!) 165/97  Pulse: 98 93  Resp: 17 20  Temp: 98.7 F (37.1 C) 98.8 F (37.1 C)  SpO2: 97% 99%   Vitals:   05/03/18 2049 05/03/18 2100 05/03/18 2149 05/04/18 0559  BP: 133/79 128/84 124/88 (!) 165/97  Pulse: 89 93 98 93  Resp:  17 17 20   Temp:   98.7 F (37.1 C) 98.8 F (37.1 C)  TempSrc:   Oral Oral  SpO2: 100% 100% 97% 99%    General: Pt is alert, awake, not in acute distress Cardiovascular: RRR, S1/S2 +, no rubs, no gallops Respiratory: CTA bilaterally, no wheezing, no rhonchi Abdominal: Soft, NT, ND, bowel sounds + Extremities: no edema, no cyanosis    The results of significant diagnostics from this  hospitalization (including imaging, microbiology, ancillary and laboratory) are listed below for reference.     Microbiology: No results found for this or any previous visit (from the past 240 hour(s)).   Labs: BNP (last 3 results) No results for input(s): BNP in the last 8760 hours. Basic Metabolic Panel: Recent Labs  Lab 05/03/18 1308  NA 140  K 3.7  CL 107  CO2 21*  GLUCOSE 118*  BUN 14  CREATININE 1.00  CALCIUM 9.3   Liver Function Tests: Recent Labs  Lab 05/03/18 1308  AST 19  ALT 17  ALKPHOS 108  BILITOT 0.2*  PROT 8.7*  ALBUMIN 4.5   Recent Labs  Lab 05/03/18 1308  LIPASE 56*   No results for input(s): AMMONIA in the last 168 hours. CBC: Recent Labs  Lab 05/03/18 1308 05/04/18 0558  WBC 18.5* 17.2*  NEUTROABS 16.6*  --   HGB 11.6* 10.6*  HCT 38.8 36.2  MCV 77.9* 78.7*  PLT 297 323   Cardiac Enzymes: No results for input(s): CKTOTAL, CKMB, CKMBINDEX, TROPONINI in the last 168 hours. BNP: Invalid input(s): POCBNP CBG: No results for input(s): GLUCAP in the last 168 hours. D-Dimer No results for input(s): DDIMER in the last 72 hours. Hgb A1c No results for input(s): HGBA1C in the last 72 hours. Lipid Profile No results for input(s): CHOL, HDL, LDLCALC, TRIG, CHOLHDL, LDLDIRECT in the last 72 hours. Thyroid function studies No results for input(s): TSH, T4TOTAL, T3FREE, THYROIDAB in the last 72 hours.  Invalid input(s): FREET3 Anemia work up Recent Labs    05/04/18 0558  FERRITIN 6*  TIBC 482*  IRON 16*   Urinalysis    Component Value Date/Time   COLORURINE YELLOW 05/03/2018 1308   APPEARANCEUR CLOUDY (A) 05/03/2018 1308   LABSPEC 1.039 (H) 05/03/2018 1308   PHURINE 5.0 05/03/2018 1308   GLUCOSEU NEGATIVE 05/03/2018 1308   HGBUR NEGATIVE 05/03/2018 1308   BILIRUBINUR NEGATIVE 05/03/2018 1308   KETONESUR 20 (A) 05/03/2018 1308   PROTEINUR 30 (A) 05/03/2018 1308   UROBILINOGEN 0.2 09/22/2014 0730   NITRITE NEGATIVE 05/03/2018  1308   LEUKOCYTESUR NEGATIVE 05/03/2018 1308   Sepsis Labs Invalid input(s): PROCALCITONIN,  WBC,  LACTICIDVEN Microbiology No results found for this or any previous visit (from the past 240 hour(s)).  Please note: You were cared for by a hospitalist during your hospital stay. Once you are discharged, your primary care physician will handle any further medical issues. Please note that NO REFILLS for any discharge medications will be authorized once you are discharged, as it is imperative that you return to your primary care physician (or establish a relationship with a primary care physician if you do not have one) for your post hospital discharge needs so that they can reassess your need for medications and monitor your lab values.    Time coordinating discharge: 40 minutes  SIGNED:   Shelly Coss, MD  [facility] 05/04/2018, 12:59 PM Pager [phone]  If 7PM-7AM, please contact night-coverage [email or web address] Password [email or web address]

## 2018-05-04 NOTE — Progress Notes (Signed)
Key Points: Use following P&T approved IV to PO non-antibiotic change policy.  Description contains the criteria that are approved Note: Policy Excludes:  Esophagectomy patientsPHARMACIST - PHYSICIAN COMMUNICATION DR:   [facility] CONCERNING: IV to Oral Route Change Policy  RECOMMENDATION: This patient is receiving protonix by the intravenous route.  Based on criteria approved by the Pharmacy and Therapeutics Committee, the intravenous medication(s) is/are being converted to the equivalent oral dose form(s).   DESCRIPTION: These criteria include:  The patient is eating (either orally or via tube) and/or has been taking other orally administered medications for a least 24 hours  The patient has no evidence of active gastrointestinal bleeding or impaired GI absorption (gastrectomy, short bowel, patient on TNA or NPO).  If you have questions about this conversion, please contact the Pharmacy Department  []   [phone] )  [facility] []   [phone] )  [facility]  []   [phone] )  [facility] [x]   [phone] )  [place], [place], [facility] 05/04/2018 8:40 AM

## 2018-05-04 NOTE — Progress Notes (Signed)
PROGRESS NOTE    Haley Freeman  NWG:[id number] DOB: 03/11/1975 DOA: 05/03/2018 PCP: Clent Demark, PA-C   Brief Narrative: Patient is a 43 year old female with history of gastroparesis, anxiety, depression who presented to the emergency department with complaints of nausea and vomiting.  She gave history of intractable nausea and vomiting for last 10 days.  Patient states he takes marijuana that helps with her nausea.  Patient was admitted for intractable nausea and vomiting and inability to tolerate orally.  Assessment & Plan:   Principal Problem:   Intractable nausea and vomiting Active Problems:   Cannabis hyperemesis syndrome concurrent with and due to cannabis abuse (HCC)   Gastroparesis   Diarrhea   Chronic anemia  Intractable nausea/vomiting: Was having nausea and vomiting for last 10 days.  Feels better this morning.  Continue antiemetics.. Continue gentle IV fluids.  Gastroparesis: Has history of gastroparesis.  Takes Reglan at home.  Unknown etiology.  No history of diabetes.  Denies any abdominal pain this morning.  Diarrhea: Apparently has resolved.  2 days history of watery diarrhea.  Denies any antibiotic use.  CT abdomen//pelvis did not show any acute intra-abdominal findings.  Loperamide as needed.  GI pathogen panel pending  Marijuana abuse: Also suspected cannabis hyperemesis syndrome due to ongoing cannabis abuse.  She states he takes cannabis to alleviate her nausea.  Counseled for cessation.  Chronic microcytic anemia: Hemoglobin in the range of 10.  Iron studies  shows low iron and ferritin.  Will start on supplementation.  Leukocytosis: Most likely reactive.  We will continue to monitor.         DVT prophylaxis: Lovenox Code Status: Full Family Communication: None present at the bedside Disposition Plan: Home tomorrow   Consultants: None  Procedures: None  Antimicrobials:  Anti-infectives (From admission, onward)   None       Subjective: Patient seen and examined at bedside this morning.  Hemodynamically stable.  Nausea and vomiting have improved.  Diarrhea resolved.  But continues to have very poor oral intake and was unable to take her breakfast.  We discussed at the bedside and concluded plan  to monitor her for 1 more night.  Objective: Vitals:   05/03/18 2049 05/03/18 2100 05/03/18 2149 05/04/18 0559  BP: 133/79 128/84 124/88 (!) 165/97  Pulse: 89 93 98 93  Resp:  17 17 20   Temp:   98.7 F (37.1 C) 98.8 F (37.1 C)  TempSrc:   Oral Oral  SpO2: 100% 100% 97% 99%    Intake/Output Summary (Last 24 hours) at 05/04/2018 1145 Last data filed at 05/04/2018 0900 Gross per 24 hour  Intake 1242.2 ml  Output 600 ml  Net 642.2 ml   There were no vitals filed for this visit.  Examination:  General exam: Not in distress,average built HEENT:PERRL,Oral mucosa moist, Ear/Nose normal on gross exam Respiratory system: Bilateral equal air entry, normal vesicular breath sounds, no wheezes or crackles  Cardiovascular system: S1 & S2 heard, RRR. No JVD, murmurs, rubs, gallops or clicks. No pedal edema. Gastrointestinal system: Abdomen is nondistended, soft and nontender. No organomegaly or masses felt. Normal bowel sounds heard. Central nervous system: Alert and oriented. No focal neurological deficits. Extremities: No edema, no clubbing ,no cyanosis, distal peripheral pulses palpable. Skin: No rashes, lesions or ulcers,no icterus ,no pallor   Data Reviewed: I have personally reviewed following labs and imaging studies  CBC: Recent Labs  Lab 05/03/18 1308 05/04/18 0558  WBC 18.5* 17.2*  NEUTROABS 16.6*  --  HGB 11.6* 10.6*  HCT 38.8 36.2  MCV 77.9* 78.7*  PLT 297 789   Basic Metabolic Panel: Recent Labs  Lab 05/03/18 1308  NA 140  K 3.7  CL 107  CO2 21*  GLUCOSE 118*  BUN 14  CREATININE 1.00  CALCIUM 9.3   GFR: Estimated Creatinine Clearance: 106.3 mL/min (by C-G formula based on SCr of  1 mg/dL). Liver Function Tests: Recent Labs  Lab 05/03/18 1308  AST 19  ALT 17  ALKPHOS 108  BILITOT 0.2*  PROT 8.7*  ALBUMIN 4.5   Recent Labs  Lab 05/03/18 1308  LIPASE 56*   No results for input(s): AMMONIA in the last 168 hours. Coagulation Profile: No results for input(s): INR, PROTIME in the last 168 hours. Cardiac Enzymes: No results for input(s): CKTOTAL, CKMB, CKMBINDEX, TROPONINI in the last 168 hours. BNP (last 3 results) No results for input(s): PROBNP in the last 8760 hours. HbA1C: No results for input(s): HGBA1C in the last 72 hours. CBG: No results for input(s): GLUCAP in the last 168 hours. Lipid Profile: No results for input(s): CHOL, HDL, LDLCALC, TRIG, CHOLHDL, LDLDIRECT in the last 72 hours. Thyroid Function Tests: No results for input(s): TSH, T4TOTAL, FREET4, T3FREE, THYROIDAB in the last 72 hours. Anemia Panel: Recent Labs    05/04/18 0558  FERRITIN 6*  TIBC 482*  IRON 16*   Sepsis Labs: No results for input(s): PROCALCITON, LATICACIDVEN in the last 168 hours.  No results found for this or any previous visit (from the past 240 hour(s)).       Radiology Studies: Ct Abdomen Pelvis W Contrast  Result Date: 05/03/2018 CLINICAL DATA:  Epigastric pain with nausea vomiting and diarrhea EXAM: CT ABDOMEN AND PELVIS WITH CONTRAST TECHNIQUE: Multidetector CT imaging of the abdomen and pelvis was performed using the standard protocol following bolus administration of intravenous contrast. CONTRAST:  173mL OMNIPAQUE IOHEXOL 300 MG/ML  SOLN COMPARISON:  CT 10/24/2017, 09/27/2017 FINDINGS: Lower chest: Lung bases demonstrate no acute consolidation or pleural effusion. Heart size within normal limits. Small hiatal hernia. Hepatobiliary: Vague hypodensity in the posterior right hepatic lobe as before. Status post cholecystectomy. No biliary enlargement Pancreas: Unremarkable. No pancreatic ductal dilatation or surrounding inflammatory changes. Spleen: Normal  in size without focal abnormality. Adrenals/Urinary Tract: Right adrenal gland is normal. Probable fat containing left adrenal mass measuring 2 cm. Kidneys are normal, without renal calculi, focal lesion, or hydronephrosis. Bladder is unremarkable. Stomach/Bowel: Stomach is within normal limits. Appendix appears normal. No evidence of bowel wall thickening, distention, or inflammatory changes. Vascular/Lymphatic: No significant vascular findings are present. No enlarged abdominal or pelvic lymph nodes. Reproductive: Uterus and bilateral adnexa are unremarkable. Other: Negative for free air or free fluid. Small fat containing umbilical and periumbilical hernias. Musculoskeletal: No acute or significant osseous findings. IMPRESSION: 1. No CT evidence for acute intra-abdominal or pelvic abnormality. 2. Other chronic findings as previously described Electronically Signed   By: Donavan Foil M.D.   On: 05/03/2018 20:00        Scheduled Meds:  enoxaparin (LOVENOX) injection  40 mg Subcutaneous QHS   [START ON 05/05/2018] ferrous sulfate  325 mg Oral Q breakfast   metoCLOPramide (REGLAN) injection  10 mg Intravenous Q6H   mirtazapine  45 mg Oral QHS   pantoprazole  40 mg Oral Daily   traZODone  300 mg Oral QHS   Continuous Infusions:   LOS: 0 days    Time spent: 35 mins.More than 50% of that time was spent in  counseling and/or coordination of care.      Shelly Coss, MD [facility] Pager [phone]  If 7PM-7AM, please contact night-coverage [email or web address] Password [facility] 05/04/2018, 11:45 AM

## 2018-05-10 ENCOUNTER — Emergency Department (HOSPITAL_COMMUNITY)
Admission: EM | Admit: 2018-05-10 | Discharge: 2018-05-10 | Disposition: A | Discharge status: Home / Self Care | Payer: Medicare Other | Attending: Emergency Medicine | Admitting: Emergency Medicine

## 2018-05-10 ENCOUNTER — Encounter (HOSPITAL_COMMUNITY): Payer: Self-pay

## 2018-05-10 ENCOUNTER — Other Ambulatory Visit: Payer: Self-pay

## 2018-05-10 DIAGNOSIS — Z87891 Personal history of nicotine dependence: Secondary | ICD-10-CM | POA: Insufficient documentation

## 2018-05-10 DIAGNOSIS — R1012 Left upper quadrant pain: Secondary | ICD-10-CM

## 2018-05-10 DIAGNOSIS — R112 Nausea with vomiting, unspecified: Secondary | ICD-10-CM | POA: Diagnosis not present

## 2018-05-10 DIAGNOSIS — Z79899 Other long term (current) drug therapy: Secondary | ICD-10-CM | POA: Insufficient documentation

## 2018-05-10 LAB — URINALYSIS, ROUTINE W REFLEX MICROSCOPIC
Bilirubin Urine: NEGATIVE
Glucose, UA: NEGATIVE mg/dL
Hgb urine dipstick: NEGATIVE
Ketones, ur: 5 mg/dL — AB
Leukocytes,Ua: NEGATIVE
Nitrite: NEGATIVE
Protein, ur: NEGATIVE mg/dL
Specific Gravity, Urine: 1.014 (ref 1.005–1.030)
pH: 7 (ref 5.0–8.0)

## 2018-05-10 LAB — CBC WITH DIFFERENTIAL/PLATELET
Abs Immature Granulocytes: 0.07 10*3/uL (ref 0.00–0.07)
Basophils Absolute: 0.1 10*3/uL (ref 0.0–0.1)
Basophils Relative: 0 %
Eosinophils Absolute: 0.1 10*3/uL (ref 0.0–0.5)
Eosinophils Relative: 1 %
HCT: 37.4 % (ref 36.0–46.0)
Hemoglobin: 10.9 g/dL — ABNORMAL LOW (ref 12.0–15.0)
Immature Granulocytes: 0 %
Lymphocytes Relative: 19 %
Lymphs Abs: 3.7 10*3/uL (ref 0.7–4.0)
MCH: 22.5 pg — ABNORMAL LOW (ref 26.0–34.0)
MCHC: 29.1 g/dL — ABNORMAL LOW (ref 30.0–36.0)
MCV: 77.3 fL — ABNORMAL LOW (ref 80.0–100.0)
Monocytes Absolute: 0.7 10*3/uL (ref 0.1–1.0)
Monocytes Relative: 3 %
Neutro Abs: 14.5 10*3/uL — ABNORMAL HIGH (ref 1.7–7.7)
Neutrophils Relative %: 77 %
Platelets: 397 10*3/uL (ref 150–400)
RBC: 4.84 MIL/uL (ref 3.87–5.11)
RDW: 18.5 % — ABNORMAL HIGH (ref 11.5–15.5)
WBC: 19 10*3/uL — ABNORMAL HIGH (ref 4.0–10.5)
nRBC: 0 % (ref 0.0–0.2)

## 2018-05-10 LAB — COMPREHENSIVE METABOLIC PANEL
ALT: 20 U/L (ref 0–44)
AST: 27 U/L (ref 15–41)
Albumin: 3.8 g/dL (ref 3.5–5.0)
Alkaline Phosphatase: 96 U/L (ref 38–126)
Anion gap: 9 (ref 5–15)
BUN: 12 mg/dL (ref 6–20)
CO2: 25 mmol/L (ref 22–32)
Calcium: 8.9 mg/dL (ref 8.9–10.3)
Chloride: 103 mmol/L (ref 98–111)
Creatinine, Ser: 1.09 mg/dL — ABNORMAL HIGH (ref 0.44–1.00)
GFR calc Af Amer: >60 mL/min (ref >60)
GFR calc non Af Amer: >60 mL/min (ref >60)
Glucose, Bld: 117 mg/dL — ABNORMAL HIGH (ref 70–99)
Potassium: 3.5 mmol/L (ref 3.5–5.1)
Sodium: 137 mmol/L (ref 135–145)
Total Bilirubin: 0.3 mg/dL (ref 0.3–1.2)
Total Protein: 7.8 g/dL (ref 6.5–8.1)

## 2018-05-10 LAB — I-STAT BETA HCG BLOOD, ED (MC, WL, AP ONLY): I-stat hCG, quantitative: <5 m[IU]/mL (ref <5)

## 2018-05-10 LAB — LIPASE, BLOOD: Lipase: 28 U/L (ref 11–51)

## 2018-05-10 MED ORDER — HALOPERIDOL LACTATE 5 MG/ML IJ SOLN
2.0000 mg | Freq: Once | INTRAMUSCULAR | Status: AC
  Administered 2018-05-10: 2 mg via INTRAVENOUS
  Filled 2018-05-10: qty 1

## 2018-05-10 MED ORDER — SODIUM CHLORIDE 0.9 % IV BOLUS
1000.0000 mL | Freq: Once | INTRAVENOUS | Status: AC
  Administered 2018-05-10: 1000 mL via INTRAVENOUS

## 2018-05-10 NOTE — ED Notes (Signed)
Pt given cranberry juice to drink.

## 2018-05-10 NOTE — Discharge Instructions (Signed)
You have been diagnosed today with left upper quadrant abdominal pain and nausea/vomiting.  At this time there does not appear to be the presence of an emergent medical condition, however there is always the potential for conditions to change. Please read and follow the below instructions.  Please return to the Emergency Department immediately for any new or worsening symptoms. Please be sure to follow up with your Primary Care Provider within one week regarding your visit today; please call their office to schedule an appointment even if you are feeling better for a follow-up visit. You may follow-up with the specialist at [facility] gastroenterology for further evaluation of your chronic abdominal pain.  You may call this office in Monday to schedule next week.  Get help right away if: Your pain does not go away as soon as your doctor says it should. You cannot stop throwing up. You have chest pain or shortness of breath You have fever Your pain is only in areas of your belly, such as the right side or the left lower part of the belly. You have bloody or black poop, or poop that looks like tar. You have very bad pain, cramping, or bloating in your belly. You have signs of not having enough fluid or water in your body (dehydration), such as: Dark pee, very little pee, or no pee. Cracked lips. Dry mouth. Sunken eyes. Sleepiness. Weakness.  Please read the additional information packets attached to your discharge summary.

## 2018-05-10 NOTE — ED Triage Notes (Signed)
For about 2 days severe abdominal pain voiced with nausea and vomiting unable to keep anything down.

## 2018-05-10 NOTE — ED Provider Notes (Signed)
[facility] DEPT Provider Note   CSN: [id number] Arrival date & time: 05/10/18  1946    History   Chief Complaint Chief Complaint  Patient presents with   Abdominal Pain    HPI Haley Freeman is a 43 y.o. female presenting today for abdominal pain nausea and vomiting.  Patient reports that she was recently admitted for gastroparesis on 05/03/2018 for similar symptoms.  Patient states that she was discharged and shortly after developed the same pain which she describes as epigastric aching throb she reports the pain is been constant since discharge however has acutely worsened over the past 2 days she describes her pain as severe worsened with palpation and emesis and without alleviating factors.  She describes nonbloody/nonbilious emesis and denies diarrhea.  Patient denies fever/chills, cough/shortness of breath, chest pain or any additional concerns.  Patient does have history of cannabis hyperemesis syndrome however she denies any recent marijuana use in the past 2 weeks.     HPI  Past Medical History:  Diagnosis Date   Alcohol abuse    Anxiety    Chronic anemia 05/04/2018   Depression    Headache(784.0)    Neuromuscular disorder [facility])     Patient Active Problem List   Diagnosis Date Noted   Gastroparesis 05/04/2018   Diarrhea 05/04/2018   Chronic anemia 66/06/3014   Cyclic vomiting syndrome 11/01/2017   Epigastric pain    Nausea & vomiting 10/24/2017   Hypokalemia 09/27/2017   Elevated LFTs 09/27/2017   Abnormal biliary HIDA scan 09/27/2017   Marijuana abuse 09/27/2017   Intractable nausea and vomiting 09/26/2017   Cannabis hyperemesis syndrome concurrent with and due to cannabis abuse ([place]) 09/16/2017   Nausea and vomiting 09/06/2017   Dermoid 08/22/2017    Past Surgical History:  Procedure Laterality Date   CESAREAN SECTION  '99 and '08   x 2   CHOLECYSTECTOMY N/A 10/01/2017   Procedure: LAPAROSCOPIC  CHOLECYSTECTOMY WITH POSSIBLE  INTRAOPERATIVE CHOLANGIOGRAM;  Surgeon: Stark Klein, MD;  Location: [facility];  Service: General;  Laterality: N/A;   ESOPHAGOGASTRODUODENOSCOPY N/A 09/16/2017   Procedure: ESOPHAGOGASTRODUODENOSCOPY (EGD) WITH PROPOFOL;  Surgeon: Clarene Essex, MD;  Location: [facility];  Service: Endoscopy;  Laterality: N/A;   LAPAROSCOPY Right 08/22/2017   Procedure: LAPAROSCOPY OPERATIVE removal of right ovary and right dermoid cyst;  Surgeon: Sloan Leiter, MD;  Location: [facility];  Service: Gynecology;  Laterality: Right;     OB History    Gravida  3   Para  3   Term      Preterm      AB      Living  3     SAB      TAB      Ectopic      Multiple      Live Births               Home Medications    Prior to Admission medications   Medication Sig Start Date End Date Taking? Authorizing Provider  ferrous sulfate 325 (65 FE) MG tablet Take 1 tablet (325 mg total) by mouth daily with breakfast. 05/05/18   Shelly Coss, MD  hydrOXYzine (ATARAX/VISTARIL) 25 MG tablet Take 50 mg by mouth at bedtime as needed for nausea or vomiting.  09/20/17   [provider]  metoCLOPramide (REGLAN) 5 MG tablet Take 1 tablet (5 mg total) by mouth 3 (three) times daily before meals. 10/30/17   Charlynne Cousins, MD  mirtazapine (REMERON)  45 MG tablet Take 45 mg by mouth at bedtime.  10/16/17   [provider]  pantoprazole (PROTONIX) 40 MG tablet Take 1 tablet (40 mg total) by mouth daily. 04/24/18   Sherwood Gambler, MD  promethazine (PHENERGAN) 25 MG suppository Place 1 suppository (25 mg total) rectally every 6 (six) hours as needed for nausea or vomiting. 04/26/18   Julianne Rice, MD  simethicone (GAS-X) 80 MG chewable tablet Chew 1 tablet (80 mg total) by mouth 4 (four) times daily as needed for flatulence. 10/30/17 10/30/18  Charlynne Cousins, MD  sucralfate (CARAFATE) 1 g tablet Take 1 tablet (1 g total) by mouth 4 (four) times daily -  with  meals and at bedtime. 04/26/18   Julianne Rice, MD  traZODone (DESYREL) 100 MG tablet Take 300 mg by mouth at bedtime.     [provider]    Family History Family History  Problem Relation Age of Onset   Hypertension Mother     Social History Social History   Tobacco Use   Smoking status: Former Smoker    Packs/day: 0.50   Smokeless tobacco: Never Used  Substance Use Topics   Alcohol use: No    Comment: occasionally   Drug use: Yes    Types: Other-see comments, Marijuana    Comment: Occasionally smokes marijuana      Allergies   Penicillins and Strawberry (diagnostic)   Review of Systems Review of Systems  Constitutional: Negative.  Negative for chills and fever.  Respiratory: Negative.  Negative for cough and shortness of breath.   Cardiovascular: Negative.  Negative for chest pain.  Gastrointestinal: Positive for abdominal pain, nausea and vomiting. Negative for diarrhea.  Genitourinary: Negative.  Negative for dysuria, hematuria, vaginal bleeding and vaginal discharge.  All other systems reviewed and are negative.  Physical Exam Updated Vital Signs BP 107/71    Pulse 89    Temp 98.6 F (37 C) (Oral)    Resp 20    Ht 5\' 8"  (1.727 m)    Wt 136.1 kg    SpO2 100%    BMI 45.61 kg/m   Physical Exam Constitutional:      General: She is not in acute distress.    Appearance: Normal appearance. She is well-developed. She is obese. She is not ill-appearing or diaphoretic.     Comments: Morbidly obese  HENT:     Head: Normocephalic and atraumatic.     Right Ear: External ear normal.     Left Ear: External ear normal.     Nose: Nose normal.  Eyes:     General: Vision grossly intact. Gaze aligned appropriately.     Pupils: Pupils are equal, round, and reactive to light.  Neck:     Musculoskeletal: Normal range of motion.     Trachea: Trachea and phonation normal. No tracheal deviation.  Cardiovascular:     Rate and Rhythm: Normal rate and regular  rhythm.  Pulmonary:     Effort: Pulmonary effort is normal. No respiratory distress.     Breath sounds: Normal breath sounds. No rhonchi.  Abdominal:     General: There is no distension.     Palpations: Abdomen is soft.     Tenderness: There is abdominal tenderness in the epigastric area and left upper quadrant. There is no guarding or rebound. Negative signs include McBurney's sign.  Genitourinary:    Comments: Examination deferred by patient Musculoskeletal: Normal range of motion.  Skin:    General: Skin is warm and  dry.  Neurological:     Mental Status: She is alert.     GCS: GCS eye subscore is 4. GCS verbal subscore is 5. GCS motor subscore is 6.     Comments: Speech is clear and goal oriented, follows commands Major Cranial nerves without deficit, no facial droop Moves extremities without ataxia, coordination intact  Psychiatric:        Behavior: Behavior normal.    ED Treatments / Results  Labs (all labs ordered are listed, but only abnormal results are displayed) Labs Reviewed  CBC WITH DIFFERENTIAL/PLATELET - Abnormal; Notable for the following components:      Result Value   WBC 19.0 (*)    Hemoglobin 10.9 (*)    MCV 77.3 (*)    MCH 22.5 (*)    MCHC 29.1 (*)    RDW 18.5 (*)    Neutro Abs 14.5 (*)    All other components within normal limits  COMPREHENSIVE METABOLIC PANEL - Abnormal; Notable for the following components:   Glucose, Bld 117 (*)    Creatinine, Ser 1.09 (*)    All other components within normal limits  URINALYSIS, ROUTINE W REFLEX MICROSCOPIC - Abnormal; Notable for the following components:   Ketones, ur 5 (*)    All other components within normal limits  LIPASE, BLOOD  I-STAT BETA HCG BLOOD, ED ([facility], [facility], [facility] ONLY)    EKG None  Radiology No results found.  Procedures Procedures (including critical care time)  Medications Ordered in ED Medications  haloperidol lactate (HALDOL) injection 2 mg (2 mg Intravenous Given 05/10/18 2048)  sodium  chloride 0.9 % bolus 1,000 mL (1,000 mLs Intravenous New Bag/Given 05/10/18 2120)     Initial Impression / Assessment and Plan / ED Course  I have reviewed the triage vital signs and the nursing notes.  Pertinent labs & imaging results that were available during my care of the patient were reviewed by me and considered in my medical decision making (see chart for details).    On initial evaluation patient is well-appearing walking around the room in no acute distress.  She is endorsing severe epigastric and left upper quadrant abdominal pain as well as nausea and is holding an empty emesis bag.  She has diffuse tenderness along the upper abdomen to light palpation no rebound guarding or peritoneal signs on examination.  Lungs clear to auscultation bilaterally heart regular rate and rhythm without murmur.  Abdominal labs ordered, discussed with Dr. Sedonia Small 2 mg Haldol IV ordered. - 9:37 PM: Patient reassessed resting comfortably in bed no acute distress.  Patient reports she is feeling much better following 2 mg Haldol today she states that she is having only minimal abdominal pain at this time greatly improved she also endorses complete resolution of her nausea that she is requesting food and drink at this time.  Reassessment of the abdomen reveals minimal epigastric tenderness to palpation, no RLQ tenderness, distention.  rebound or peritoneal signs.  CBC with leukocytosis of 19.0, review of recent labs shows similar  urine pregnancy negative Urinalysis with 5 ketones suspicious for dehydration Lipase and CMP pending at this time - Case and labs discussed with Dr. Sedonia Small who has seen and evaluated patient advises that CTAP not indicated at this time as patient with leukocytosis commonly her flares and does not have an tender abdomen on exam plan of care is to await remainder of lab work, p.o. challenge and anticipate discharge - CMP with creatinine of 1.09 Lipase within normal limits  P.o.  challenge by nursing staff patient is being treated.  - 10:00 PM: Patient reevaluated resting comfortably no acute distress.  She has completed her p.o. challenge without nausea vomiting or increase in her abdominal pain.  She states that she is now pain-free and is requesting discharge as her daughter is on her way to pick her up.  Referral given to gastroenterology for further work-up of her chronic abdominal pain nausea and vomiting I have informed her to call them on Monday to schedule an appointment. - As above suspect patient with gastroparesis, chronic abdominal pain and nausea and vomiting which has now resided.  Despite leukocytosis I have low suspicion for acute intra-abdominal infection, obstruction, perforation or other acute processes.   At this time there does not appear to be any evidence of an acute emergency medical condition and the patient appears stable for discharge with appropriate outpatient follow up. Diagnosis was discussed with patient who verbalizes understanding of care plan and is agreeable to discharge. I have discussed return precautions with patient who verbalizes understanding of return precautions. Patient encouraged to follow-up with their PCP. All questions answered.  Patient has been discharged in good condition.  Patient's case rediscussed with Dr. Sedonia Small who agrees with plan to discharge with follow-up.   Note: Portions of this report may have been transcribed using voice recognition software. Every effort was made to ensure accuracy; however, inadvertent computerized transcription errors may still be present.  Final Clinical Impressions(s) / ED Diagnoses   Final diagnoses:  Left upper quadrant pain  Non-intractable vomiting with nausea, unspecified vomiting type    ED Discharge Orders    None       Gari Crown 05/10/18 2213    Maudie Flakes, MD 05/10/18 2239

## 2018-05-12 ENCOUNTER — Emergency Department (HOSPITAL_COMMUNITY): Payer: Medicare Other

## 2018-05-12 ENCOUNTER — Emergency Department (HOSPITAL_COMMUNITY)
Admission: EM | Admit: 2018-05-12 | Discharge: 2018-05-12 | Disposition: A | Discharge status: Home / Self Care | Payer: Medicare Other | Attending: Emergency Medicine | Admitting: Emergency Medicine

## 2018-05-12 ENCOUNTER — Encounter (HOSPITAL_COMMUNITY): Payer: Self-pay | Admitting: Emergency Medicine

## 2018-05-12 ENCOUNTER — Other Ambulatory Visit: Payer: Self-pay

## 2018-05-12 DIAGNOSIS — Z87891 Personal history of nicotine dependence: Secondary | ICD-10-CM | POA: Insufficient documentation

## 2018-05-12 DIAGNOSIS — R109 Unspecified abdominal pain: Secondary | ICD-10-CM | POA: Diagnosis not present

## 2018-05-12 DIAGNOSIS — R1084 Generalized abdominal pain: Secondary | ICD-10-CM | POA: Insufficient documentation

## 2018-05-12 DIAGNOSIS — R1115 Cyclical vomiting syndrome unrelated to migraine: Secondary | ICD-10-CM | POA: Diagnosis not present

## 2018-05-12 DIAGNOSIS — Z79899 Other long term (current) drug therapy: Secondary | ICD-10-CM | POA: Insufficient documentation

## 2018-05-12 DIAGNOSIS — R1110 Vomiting, unspecified: Secondary | ICD-10-CM | POA: Diagnosis present

## 2018-05-12 LAB — CBC WITH DIFFERENTIAL/PLATELET
Abs Immature Granulocytes: 0.11 10*3/uL — ABNORMAL HIGH (ref 0.00–0.07)
Basophils Absolute: 0.1 10*3/uL (ref 0.0–0.1)
Basophils Relative: 0 %
Eosinophils Absolute: 0.2 10*3/uL (ref 0.0–0.5)
Eosinophils Relative: 1 %
HCT: 39.1 % (ref 36.0–46.0)
Hemoglobin: 11.8 g/dL — ABNORMAL LOW (ref 12.0–15.0)
Immature Granulocytes: 0 %
Lymphocytes Relative: 17 %
Lymphs Abs: 4.3 10*3/uL — ABNORMAL HIGH (ref 0.7–4.0)
MCH: 22.8 pg — ABNORMAL LOW (ref 26.0–34.0)
MCHC: 30.2 g/dL (ref 30.0–36.0)
MCV: 75.6 fL — ABNORMAL LOW (ref 80.0–100.0)
Monocytes Absolute: 0.8 10*3/uL (ref 0.1–1.0)
Monocytes Relative: 3 %
Neutro Abs: 19.5 10*3/uL — ABNORMAL HIGH (ref 1.7–7.7)
Neutrophils Relative %: 79 %
Platelets: 488 10*3/uL — ABNORMAL HIGH (ref 150–400)
RBC: 5.17 MIL/uL — ABNORMAL HIGH (ref 3.87–5.11)
RDW: 18.8 % — ABNORMAL HIGH (ref 11.5–15.5)
WBC: 25 10*3/uL — ABNORMAL HIGH (ref 4.0–10.5)
nRBC: 0 % (ref 0.0–0.2)

## 2018-05-12 LAB — URINALYSIS, ROUTINE W REFLEX MICROSCOPIC
Bilirubin Urine: NEGATIVE
Glucose, UA: NEGATIVE mg/dL
Hgb urine dipstick: NEGATIVE
Ketones, ur: 5 mg/dL — AB
Nitrite: NEGATIVE
Protein, ur: NEGATIVE mg/dL
Specific Gravity, Urine: 1.04 — ABNORMAL HIGH (ref 1.005–1.030)
pH: 7 (ref 5.0–8.0)

## 2018-05-12 LAB — COMPREHENSIVE METABOLIC PANEL
ALT: 21 U/L (ref 0–44)
AST: 20 U/L (ref 15–41)
Albumin: 4.3 g/dL (ref 3.5–5.0)
Alkaline Phosphatase: 107 U/L (ref 38–126)
Anion gap: 12 (ref 5–15)
BUN: 14 mg/dL (ref 6–20)
CO2: 22 mmol/L (ref 22–32)
Calcium: 9.2 mg/dL (ref 8.9–10.3)
Chloride: 102 mmol/L (ref 98–111)
Creatinine, Ser: 1.12 mg/dL — ABNORMAL HIGH (ref 0.44–1.00)
GFR calc Af Amer: >60 mL/min (ref >60)
GFR calc non Af Amer: >60 mL/min (ref >60)
Glucose, Bld: 130 mg/dL — ABNORMAL HIGH (ref 70–99)
Potassium: 3.1 mmol/L — ABNORMAL LOW (ref 3.5–5.1)
Sodium: 136 mmol/L (ref 135–145)
Total Bilirubin: 0.6 mg/dL (ref 0.3–1.2)
Total Protein: 8.4 g/dL — ABNORMAL HIGH (ref 6.5–8.1)

## 2018-05-12 LAB — I-STAT BETA HCG BLOOD, ED (MC, WL, AP ONLY): I-stat hCG, quantitative: <5 m[IU]/mL (ref <5)

## 2018-05-12 LAB — LIPASE, BLOOD: Lipase: 28 U/L (ref 11–51)

## 2018-05-12 MED ORDER — SODIUM CHLORIDE (PF) 0.9 % IJ SOLN
INTRAMUSCULAR | Status: AC
  Filled 2018-05-12: qty 50

## 2018-05-12 MED ORDER — LACTATED RINGERS IV BOLUS
1000.0000 mL | Freq: Once | INTRAVENOUS | Status: AC
  Administered 2018-05-12: 10:00:00 1000 mL via INTRAVENOUS

## 2018-05-12 MED ORDER — HALOPERIDOL LACTATE 5 MG/ML IJ SOLN
5.0000 mg | Freq: Once | INTRAMUSCULAR | Status: AC
  Administered 2018-05-12: 5 mg via INTRAVENOUS
  Filled 2018-05-12: qty 1

## 2018-05-12 MED ORDER — POTASSIUM CHLORIDE 10 MEQ/100ML IV SOLN
10.0000 meq | Freq: Once | INTRAVENOUS | Status: AC
  Administered 2018-05-12: 10 meq via INTRAVENOUS
  Filled 2018-05-12: qty 100

## 2018-05-12 MED ORDER — POTASSIUM CHLORIDE CRYS ER 20 MEQ PO TBCR
20.0000 meq | EXTENDED_RELEASE_TABLET | Freq: Once | ORAL | Status: AC
  Administered 2018-05-12: 20 meq via ORAL
  Filled 2018-05-12: qty 1

## 2018-05-12 MED ORDER — IOHEXOL 300 MG/ML  SOLN
100.0000 mL | Freq: Once | INTRAMUSCULAR | Status: AC | PRN
  Administered 2018-05-12: 100 mL via INTRAVENOUS

## 2018-05-12 MED ORDER — DIPHENHYDRAMINE HCL 50 MG/ML IJ SOLN
25.0000 mg | Freq: Once | INTRAMUSCULAR | Status: AC
  Administered 2018-05-12: 25 mg via INTRAVENOUS
  Filled 2018-05-12: qty 1

## 2018-05-12 NOTE — ED Provider Notes (Signed)
[facility] DEPT Provider Note   CSN: [id number] Arrival date & time: 05/12/18  0909    History   Chief Complaint Chief Complaint  Patient presents with   Abdominal Pain   Emesis    HPI Haley Freeman is a 43 y.o. female.     The history is provided by the patient.  Abdominal Pain  Pain location:  Generalized Pain quality: aching and dull   Pain radiates to:  Does not radiate Pain severity:  Mild Onset quality:  Gradual Timing:  Constant Progression:  Unchanged Chronicity:  Recurrent Context comment:  Hx of gastroparesis, chronic cyclical vomiting Relieved by:  Nothing Worsened by:  Nothing Ineffective treatments:  None tried Associated symptoms: nausea and vomiting   Associated symptoms: no chest pain, no chills, no cough, no diarrhea, no dysuria, no fever, no hematuria, no shortness of breath and no sore throat   Risk factors: not pregnant     Past Medical History:  Diagnosis Date   Alcohol abuse    Anxiety    Chronic anemia 05/04/2018   Depression    Headache(784.0)    Neuromuscular disorder [facility])     Patient Active Problem List   Diagnosis Date Noted   Gastroparesis 05/04/2018   Diarrhea 05/04/2018   Chronic anemia 40/98/1191   Cyclic vomiting syndrome 11/01/2017   Epigastric pain    Nausea & vomiting 10/24/2017   Hypokalemia 09/27/2017   Elevated LFTs 09/27/2017   Abnormal biliary HIDA scan 09/27/2017   Marijuana abuse 09/27/2017   Intractable nausea and vomiting 09/26/2017   Cannabis hyperemesis syndrome concurrent with and due to cannabis abuse ([place]) 09/16/2017   Nausea and vomiting 09/06/2017   Dermoid 08/22/2017    Past Surgical History:  Procedure Laterality Date   CESAREAN SECTION  '99 and '08   x 2   CHOLECYSTECTOMY N/A 10/01/2017   Procedure: LAPAROSCOPIC CHOLECYSTECTOMY WITH POSSIBLE  INTRAOPERATIVE CHOLANGIOGRAM;  Surgeon: Stark Klein, MD;  Location: [facility];  Service:  General;  Laterality: N/A;   ESOPHAGOGASTRODUODENOSCOPY N/A 09/16/2017   Procedure: ESOPHAGOGASTRODUODENOSCOPY (EGD) WITH PROPOFOL;  Surgeon: Clarene Essex, MD;  Location: [facility] ENDOSCOPY;  Service: Endoscopy;  Laterality: N/A;   LAPAROSCOPY Right 08/22/2017   Procedure: LAPAROSCOPY OPERATIVE removal of right ovary and right dermoid cyst;  Surgeon: Sloan Leiter, MD;  Location: [facility]S;  Service: Gynecology;  Laterality: Right;     OB History    Gravida  3   Para  3   Term      Preterm      AB      Living  3     SAB      TAB      Ectopic      Multiple      Live Births               Home Medications    Prior to Admission medications   Medication Sig Start Date End Date Taking? Authorizing Provider  ferrous sulfate 325 (65 FE) MG tablet Take 1 tablet (325 mg total) by mouth daily with breakfast. 05/05/18  Yes Shelly Coss, MD  hydrOXYzine (ATARAX/VISTARIL) 25 MG tablet Take 50 mg by mouth at bedtime as needed for nausea or vomiting.  09/20/17  Yes [provider]  metoCLOPramide (REGLAN) 5 MG tablet Take 1 tablet (5 mg total) by mouth 3 (three) times daily before meals. 10/30/17  Yes Charlynne Cousins, MD  mirtazapine (REMERON) 45 MG tablet Take 45 mg by mouth  at bedtime.  10/16/17  Yes [provider]  pantoprazole (PROTONIX) 40 MG tablet Take 1 tablet (40 mg total) by mouth daily. 04/24/18  Yes Sherwood Gambler, MD  promethazine (PHENERGAN) 25 MG suppository Place 1 suppository (25 mg total) rectally every 6 (six) hours as needed for nausea or vomiting. 04/26/18  Yes Julianne Rice, MD  sucralfate (CARAFATE) 1 g tablet Take 1 tablet (1 g total) by mouth 4 (four) times daily -  with meals and at bedtime. 04/26/18  Yes Julianne Rice, MD  traZODone (DESYREL) 100 MG tablet Take 300 mg by mouth at bedtime.    Yes [provider]  simethicone (GAS-X) 80 MG chewable tablet Chew 1 tablet (80 mg total) by mouth 4 (four) times daily as needed for  flatulence. Patient not taking: Reported on 05/12/2018 10/30/17 10/30/18  Charlynne Cousins, MD    Family History Family History  Problem Relation Age of Onset   Hypertension Mother     Social History Social History   Tobacco Use   Smoking status: Former Smoker    Packs/day: 0.50   Smokeless tobacco: Never Used  Substance Use Topics   Alcohol use: No    Comment: occasionally   Drug use: Yes    Types: Other-see comments, Marijuana    Comment: Occasionally smokes marijuana      Allergies   Penicillins and Strawberry (diagnostic)   Review of Systems Review of Systems  Constitutional: Negative for chills and fever.  HENT: Negative for ear pain and sore throat.   Eyes: Negative for pain and visual disturbance.  Respiratory: Negative for cough and shortness of breath.   Cardiovascular: Negative for chest pain and palpitations.  Gastrointestinal: Positive for abdominal pain, nausea and vomiting. Negative for diarrhea.  Genitourinary: Negative for dysuria and hematuria.  Musculoskeletal: Negative for arthralgias and back pain.  Skin: Negative for color change and rash.  Neurological: Negative for seizures and syncope.  All other systems reviewed and are negative.    Physical Exam Updated Vital Signs  ED Triage Vitals  Enc Vitals Group     BP 05/12/18 0921 111/73     Pulse Rate 05/12/18 0921 (!) 110     Resp 05/12/18 0921 20     Temp 05/12/18 0921 98.7 F (37.1 C)     Temp Source 05/12/18 0921 Oral     SpO2 05/12/18 0921 98 %     Weight 05/12/18 0921 282 lb (127.9 kg)     Height 05/12/18 0921 5\' 8"  (1.727 m)     Head Circumference --      Peak Flow --      Pain Score 05/12/18 0919 8     Pain Loc --      Pain Edu? --      Excl. in [place]? --      Physical Exam Vitals signs and nursing note reviewed.  Constitutional:      General: She is not in acute distress.    Appearance: She is well-developed. She is not ill-appearing.  HENT:     Head:  Normocephalic and atraumatic.  Eyes:     Conjunctiva/sclera: Conjunctivae normal.  Neck:     Musculoskeletal: Neck supple.  Cardiovascular:     Rate and Rhythm: Normal rate and regular rhythm.     Heart sounds: Normal heart sounds. No murmur.  Pulmonary:     Effort: Pulmonary effort is normal. No respiratory distress.     Breath sounds: Normal breath sounds.  Abdominal:  General: Abdomen is flat.     Palpations: Abdomen is soft.     Tenderness: There is generalized abdominal tenderness. There is no right CVA tenderness, left CVA tenderness, guarding or rebound. Negative signs include Murphy's sign and Rovsing's sign.  Skin:    General: Skin is warm and dry.     Capillary Refill: Capillary refill takes less than 2 seconds.  Neurological:     Mental Status: She is alert.      ED Treatments / Results  Labs (all labs ordered are listed, but only abnormal results are displayed) Labs Reviewed  COMPREHENSIVE METABOLIC PANEL - Abnormal; Notable for the following components:      Result Value   Potassium 3.1 (*)    Glucose, Bld 130 (*)    Creatinine, Ser 1.12 (*)    Total Protein 8.4 (*)    All other components within normal limits  CBC WITH DIFFERENTIAL/PLATELET - Abnormal; Notable for the following components:   WBC 25.0 (*)    RBC 5.17 (*)    Hemoglobin 11.8 (*)    MCV 75.6 (*)    MCH 22.8 (*)    RDW 18.8 (*)    Platelets 488 (*)    Neutro Abs 19.5 (*)    Lymphs Abs 4.3 (*)    Abs Immature Granulocytes 0.11 (*)    All other components within normal limits  URINALYSIS, ROUTINE W REFLEX MICROSCOPIC - Abnormal; Notable for the following components:   Color, Urine STRAW (*)    APPearance HAZY (*)    Specific Gravity, Urine 1.040 (*)    Ketones, ur 5 (*)    Leukocytes,Ua TRACE (*)    Bacteria, UA RARE (*)    All other components within normal limits  LIPASE, BLOOD  I-STAT BETA HCG BLOOD, ED ([facility], [facility], [facility] ONLY)    EKG None  Radiology Ct Abdomen Pelvis W  Contrast  Result Date: 05/12/2018 CLINICAL DATA:  Acute generalized abdominal pain EXAM: CT ABDOMEN AND PELVIS WITH CONTRAST TECHNIQUE: Multidetector CT imaging of the abdomen and pelvis was performed using the standard protocol following bolus administration of intravenous contrast. CONTRAST:  13mL OMNIPAQUE IOHEXOL 300 MG/ML  SOLN COMPARISON:  05/03/2018 FINDINGS: Lower chest:  No contributory findings. Hepatobiliary: Vague low-density in the upper right liver which has been stable on multiple prior scans.Cholecystectomy with no bile duct dilatation. Pancreas: Unremarkable. Spleen: Unremarkable. Adrenals/Urinary Tract: Negative adrenals. No hydronephrosis or stone. Unremarkable bladder. Stomach/Bowel: No obstruction. No appendicitis. Small appendicoliths. History of gastroparesis. The stomach is not distended currently. Vascular/Lymphatic: No acute vascular abnormality. No mass or adenopathy. Reproductive:No pathologic findings. Other: No ascites or pneumoperitoneum. Small fatty umbilical hernia. Musculoskeletal: No acute abnormalities.  Spondylosis. IMPRESSION: 1. No acute finding or change from prior. 2. Chronic findings are described above. Electronically Signed   By: Monte Fantasia M.D.   On: 05/12/2018 11:46    Procedures Procedures (including critical care time)  Medications Ordered in ED Medications  sodium chloride (PF) 0.9 % injection (has no administration in time range)  potassium chloride 10 mEq in 100 mL IVPB (10 mEq Intravenous New Bag/Given 05/12/18 1211)  potassium chloride SA (K-DUR) CR tablet 20 mEq (has no administration in time range)  haloperidol lactate (HALDOL) injection 5 mg (5 mg Intravenous Given 05/12/18 0944)  diphenhydrAMINE (BENADRYL) injection 25 mg (25 mg Intravenous Given 05/12/18 0952)  lactated ringers bolus 1,000 mL (0 mLs Intravenous Stopped 05/12/18 1057)  iohexol (OMNIPAQUE) 300 MG/ML solution 100 mL (100 mLs Intravenous Contrast Given 05/12/18  1051)     Initial  Impression / Assessment and Plan / ED Course  I have reviewed the triage vital signs and the nursing notes.  Pertinent labs & imaging results that were available during my care of the patient were reviewed by me and considered in my medical decision making (see chart for details).     Haley Freeman is a 43 year old female with history of gastroparesis, cyclical vomiting syndrome who presents to the ED with nausea and vomiting.  Patient with unremarkable vitals except for mild tachycardia.  No fever.  Symptoms worse over the last several hours.  Has some diffuse abdominal tenderness on exam.  No signs of peritonitis.  No active nausea and vomiting upon my evaluation.  Denies any blood in the vomit or the stool.  Patient with lab work that was overall unremarkable except for leukocytosis of 25.  Baseline is around 17 for the patient.  CT of the abdomen and pelvis showed no acute findings. No appendicitis, no bowel obstruction. Urinalysis negative for infection.  Potassium was 3.1 and was repleted but otherwise no significant anemia, electrolyte abnormality, kidney injury.  Patient felt better after IV fluids, IV Haldol, IV Benadryl.  Overall likely acute on chronic cyclical vomiting syndrome.  She was able to tolerate p.o. and was discharged from ED in good condition.  Given return precautions.  This chart was dictated using voice recognition software.  Despite best efforts to proofread,  errors can occur which can change the documentation meaning.    Final Clinical Impressions(s) / ED Diagnoses   Final diagnoses:  Cyclical vomiting syndrome not associated with migraine    ED Discharge Orders    None       Lennice Sites, DO 05/12/18 1221

## 2018-05-12 NOTE — ED Triage Notes (Signed)
Pt c/o of recurrent abd pains with n/v that still ongoing from being admitted couple days ago. Denies diarrhea or urination.

## 2018-05-19 ENCOUNTER — Encounter (HOSPITAL_COMMUNITY): Payer: Self-pay

## 2018-05-19 ENCOUNTER — Other Ambulatory Visit: Payer: Self-pay

## 2018-05-19 ENCOUNTER — Emergency Department (HOSPITAL_COMMUNITY)
Admission: EM | Admit: 2018-05-19 | Discharge: 2018-05-19 | Disposition: A | Discharge status: Home / Self Care | Payer: Medicare Other | Attending: Emergency Medicine | Admitting: Emergency Medicine

## 2018-05-19 DIAGNOSIS — Z79899 Other long term (current) drug therapy: Secondary | ICD-10-CM | POA: Diagnosis not present

## 2018-05-19 DIAGNOSIS — F12988 Cannabis use, unspecified with other cannabis-induced disorder: Secondary | ICD-10-CM | POA: Insufficient documentation

## 2018-05-19 DIAGNOSIS — R112 Nausea with vomiting, unspecified: Secondary | ICD-10-CM

## 2018-05-19 DIAGNOSIS — F1290 Cannabis use, unspecified, uncomplicated: Secondary | ICD-10-CM

## 2018-05-19 DIAGNOSIS — R1084 Generalized abdominal pain: Secondary | ICD-10-CM | POA: Diagnosis not present

## 2018-05-19 DIAGNOSIS — R1116 Cannabis hyperemesis syndrome: Secondary | ICD-10-CM

## 2018-05-19 DIAGNOSIS — Z87891 Personal history of nicotine dependence: Secondary | ICD-10-CM | POA: Insufficient documentation

## 2018-05-19 LAB — URINALYSIS, ROUTINE W REFLEX MICROSCOPIC
Bacteria, UA: NONE SEEN
Bilirubin Urine: NEGATIVE
Glucose, UA: NEGATIVE mg/dL
Hgb urine dipstick: NEGATIVE
Ketones, ur: 5 mg/dL — AB
Nitrite: NEGATIVE
Protein, ur: 30 mg/dL — AB
Specific Gravity, Urine: 1.028 (ref 1.005–1.030)
pH: 6 (ref 5.0–8.0)

## 2018-05-19 LAB — COMPREHENSIVE METABOLIC PANEL
ALT: 13 U/L (ref 0–44)
AST: 23 U/L (ref 15–41)
Albumin: 3.9 g/dL (ref 3.5–5.0)
Alkaline Phosphatase: 104 U/L (ref 38–126)
Anion gap: 10 (ref 5–15)
BUN: 7 mg/dL (ref 6–20)
CO2: 21 mmol/L — ABNORMAL LOW (ref 22–32)
Calcium: 9.3 mg/dL (ref 8.9–10.3)
Chloride: 108 mmol/L (ref 98–111)
Creatinine, Ser: 0.96 mg/dL (ref 0.44–1.00)
GFR calc Af Amer: >60 mL/min (ref >60)
GFR calc non Af Amer: >60 mL/min (ref >60)
Glucose, Bld: 89 mg/dL (ref 70–99)
Potassium: 4.3 mmol/L (ref 3.5–5.1)
Sodium: 139 mmol/L (ref 135–145)
Total Bilirubin: 0.6 mg/dL (ref 0.3–1.2)
Total Protein: 8.2 g/dL — ABNORMAL HIGH (ref 6.5–8.1)

## 2018-05-19 LAB — CBC
HCT: 45.1 % (ref 36.0–46.0)
Hemoglobin: 12.6 g/dL (ref 12.0–15.0)
MCH: 22 pg — ABNORMAL LOW (ref 26.0–34.0)
MCHC: 27.9 g/dL — ABNORMAL LOW (ref 30.0–36.0)
MCV: 78.6 fL — ABNORMAL LOW (ref 80.0–100.0)
Platelets: 459 10*3/uL — ABNORMAL HIGH (ref 150–400)
RBC: 5.74 MIL/uL — ABNORMAL HIGH (ref 3.87–5.11)
RDW: 19.8 % — ABNORMAL HIGH (ref 11.5–15.5)
WBC: 22.1 10*3/uL — ABNORMAL HIGH (ref 4.0–10.5)
nRBC: 0 % (ref 0.0–0.2)

## 2018-05-19 LAB — I-STAT BETA HCG BLOOD, ED (MC, WL, AP ONLY): I-stat hCG, quantitative: 7.2 m[IU]/mL — ABNORMAL HIGH (ref <5)

## 2018-05-19 LAB — RAPID URINE DRUG SCREEN, HOSP PERFORMED
Amphetamines: NOT DETECTED
Barbiturates: NOT DETECTED
Benzodiazepines: NOT DETECTED
Cocaine: NOT DETECTED
Opiates: NOT DETECTED
Tetrahydrocannabinol: POSITIVE — AB

## 2018-05-19 LAB — LIPASE, BLOOD: Lipase: 39 U/L (ref 11–51)

## 2018-05-19 MED ORDER — HALOPERIDOL LACTATE 5 MG/ML IJ SOLN
5.0000 mg | Freq: Once | INTRAMUSCULAR | Status: AC
  Administered 2018-05-19: 20:00:00 5 mg via INTRAVENOUS
  Filled 2018-05-19: qty 1

## 2018-05-19 MED ORDER — DIPHENHYDRAMINE HCL 50 MG/ML IJ SOLN
12.5000 mg | Freq: Once | INTRAMUSCULAR | Status: AC
  Administered 2018-05-19: 12.5 mg via INTRAVENOUS
  Filled 2018-05-19: qty 1

## 2018-05-19 MED ORDER — PROMETHAZINE HCL 25 MG RE SUPP: 25.0000 mg | Freq: Four times a day (QID) | RECTAL | 1 refills | Status: DC | PRN

## 2018-05-19 MED ORDER — ONDANSETRON 4 MG PO TBDP
4.0000 mg | ORAL_TABLET | Freq: Once | ORAL | Status: AC
  Administered 2018-05-19: 17:00:00 4 mg via ORAL
  Filled 2018-05-19: qty 1

## 2018-05-19 MED ORDER — SODIUM CHLORIDE 0.9 % IV BOLUS
1000.0000 mL | Freq: Once | INTRAVENOUS | Status: AC
  Administered 2018-05-19: 1000 mL via INTRAVENOUS

## 2018-05-19 MED ORDER — SODIUM CHLORIDE 0.9% FLUSH
3.0000 mL | Freq: Once | INTRAVENOUS | Status: AC
  Administered 2018-05-19: 3 mL via INTRAVENOUS

## 2018-05-19 MED ORDER — PROMETHAZINE HCL 25 MG/ML IJ SOLN
25.0000 mg | Freq: Once | INTRAMUSCULAR | Status: AC
  Administered 2018-05-19: 18:00:00 25 mg via INTRAVENOUS
  Filled 2018-05-19: qty 1

## 2018-05-19 MED ORDER — METOCLOPRAMIDE HCL 5 MG/ML IJ SOLN
10.0000 mg | Freq: Once | INTRAMUSCULAR | Status: AC
  Administered 2018-05-19: 10 mg via INTRAVENOUS
  Filled 2018-05-19: qty 2

## 2018-05-19 NOTE — ED Notes (Signed)
PO fluids given Ginger Ale

## 2018-05-19 NOTE — ED Notes (Signed)
Unsuccessful IV attempt x2.  

## 2018-05-19 NOTE — ED Triage Notes (Signed)
Patient c/o right upper abdominal pain, emesis, and diarrhea since this AM.

## 2018-05-19 NOTE — ED Notes (Signed)
Attempted to place IVx2-informed PA-will attempt ultrasound

## 2018-05-19 NOTE — ED Notes (Signed)
Tech unable to get blood draw x 2

## 2018-05-19 NOTE — ED Provider Notes (Signed)
Ultrasound ED Peripheral IV (Provider) Date/Time: 05/19/2018 4:39 PM Performed by: Duffy Bruce, MD Authorized by: Duffy Bruce, MD   Procedure details:    Indications: hydration, multiple failed IV attempts and poor IV access     Skin Prep: chlorhexidine gluconate     Location:  Right forearm   Angiocath:  20 G   Bedside Ultrasound Guided: Yes     Images: archived     Patient tolerated procedure without complications: Yes     Dressing applied: Yes         Duffy Bruce, MD 05/19/18 1640

## 2018-05-19 NOTE — ED Provider Notes (Addendum)
[facility] DEPT Provider Note   CSN: [id number] Arrival date & time: 05/19/18  1422    History   Chief Complaint Chief Complaint  Patient presents with  . Abdominal Pain  . Emesis  . Diarrhea    HPI Haley Freeman is a 43 y.o. female.     The history is provided by the patient. No language interpreter was used.  Abdominal Pain  Pain location:  Generalized Pain severity:  Moderate Onset quality:  Gradual Timing:  Constant Progression:  Worsening Chronicity:  New Relieved by:  Nothing Worsened by:  Nothing Ineffective treatments:  None tried Associated symptoms: diarrhea and vomiting   Emesis  Associated symptoms: abdominal pain and diarrhea   Diarrhea  Associated symptoms: abdominal pain and vomiting    Pt complains of vomiting and nausea.  Pt reports she has hyperemesis.  Pt denies marijuana use Past Medical History:  Diagnosis Date  . Alcohol abuse   . Anxiety   . Chronic anemia 05/04/2018  . Depression   . Headache(784.0)   . Neuromuscular disorder [facility])     Patient Active Problem List   Diagnosis Date Noted  . Gastroparesis 05/04/2018  . Diarrhea 05/04/2018  . Chronic anemia 05/04/2018  . Cyclic vomiting syndrome 11/01/2017  . Epigastric pain   . Nausea & vomiting 10/24/2017  . Hypokalemia 09/27/2017  . Elevated LFTs 09/27/2017  . Abnormal biliary HIDA scan 09/27/2017  . Marijuana abuse 09/27/2017  . Intractable nausea and vomiting 09/26/2017  . Cannabis hyperemesis syndrome concurrent with and due to cannabis abuse ([place]) 09/16/2017  . Nausea and vomiting 09/06/2017  . Dermoid 08/22/2017    Past Surgical History:  Procedure Laterality Date  . CESAREAN SECTION  '99 and '08   x 2  . CHOLECYSTECTOMY N/A 10/01/2017   Procedure: LAPAROSCOPIC CHOLECYSTECTOMY WITH POSSIBLE  INTRAOPERATIVE CHOLANGIOGRAM;  Surgeon: Stark Klein, MD;  Location: [facility];  Service: General;  Laterality: N/A;  .  ESOPHAGOGASTRODUODENOSCOPY N/A 09/16/2017   Procedure: ESOPHAGOGASTRODUODENOSCOPY (EGD) WITH PROPOFOL;  Surgeon: Clarene Essex, MD;  Location: [place];  Service: Endoscopy;  Laterality: N/A;  . LAPAROSCOPY Right 08/22/2017   Procedure: LAPAROSCOPY OPERATIVE removal of right ovary and right dermoid cyst;  Surgeon: Sloan Leiter, MD;  Location: [facility];  Service: Gynecology;  Laterality: Right;     OB History    Gravida  3   Para  3   Term      Preterm      AB      Living  3     SAB      TAB      Ectopic      Multiple      Live Births               Home Medications    Prior to Admission medications   Medication Sig Start Date End Date Taking? Authorizing Provider  ferrous sulfate 325 (65 FE) MG tablet Take 1 tablet (325 mg total) by mouth daily with breakfast. 05/05/18  Yes Shelly Coss, MD  hydrOXYzine (ATARAX/VISTARIL) 25 MG tablet Take 50 mg by mouth at bedtime as needed for nausea or vomiting.  09/20/17  Yes [provider]  mirtazapine (REMERON) 45 MG tablet Take 45 mg by mouth at bedtime.  10/16/17  Yes [provider]  pantoprazole (PROTONIX) 40 MG tablet Take 1 tablet (40 mg total) by mouth daily. 04/24/18  Yes Sherwood Gambler, MD  sucralfate (CARAFATE) 1 g tablet Take 1 tablet (  1 g total) by mouth 4 (four) times daily -  with meals and at bedtime. 04/26/18  Yes Julianne Rice, MD  traZODone (DESYREL) 100 MG tablet Take 300 mg by mouth at bedtime.    Yes [provider]  promethazine (PHENERGAN) 25 MG suppository Place 1 suppository (25 mg total) rectally every 6 (six) hours as needed for nausea. 05/19/18 05/19/19  Fransico Meadow, PA-C    Family History Family History  Problem Relation Age of Onset  . Hypertension Mother     Social History Social History   Tobacco Use  . Smoking status: Former Smoker    Packs/day: 0.50  . Smokeless tobacco: Never Used  Substance Use Topics  . Alcohol use: No    Comment: occasionally  .  Drug use: Yes    Types: Other-see comments, Marijuana    Comment: Occasionally smokes marijuana      Allergies   Penicillins and Strawberry (diagnostic)   Review of Systems Review of Systems  Gastrointestinal: Positive for abdominal pain, diarrhea and vomiting.  All other systems reviewed and are negative.    Physical Exam Updated Vital Signs BP (!) 164/146   Pulse (!) 110   Temp 98.7 F (37.1 C) (Oral)   Resp (!) 31   Ht 5\' 8"  (1.727 m)   Wt 127.9 kg   LMP 04/28/2018 Comment: neg preg test 05/12/2018  SpO2 100%   BMI 42.88 kg/m   Physical Exam Vitals signs and nursing note reviewed.  Constitutional:      Appearance: She is well-developed.  HENT:     Head: Normocephalic.     Mouth/Throat:     Mouth: Mucous membranes are moist.  Eyes:     Extraocular Movements: Extraocular movements intact.  Neck:     Musculoskeletal: Normal range of motion.  Cardiovascular:     Rate and Rhythm: Normal rate.     Heart sounds: Normal heart sounds.  Pulmonary:     Effort: Pulmonary effort is normal.  Abdominal:     General: Abdomen is flat. Bowel sounds are normal. There is no distension.     Palpations: Abdomen is soft.     Tenderness: There is generalized abdominal tenderness.  Musculoskeletal: Normal range of motion.  Skin:    General: Skin is warm.     Capillary Refill: Capillary refill takes less than 2 seconds.  Neurological:     Mental Status: She is alert and oriented to person, place, and time.      ED Treatments / Results  Labs (all labs ordered are listed, but only abnormal results are displayed) Labs Reviewed  COMPREHENSIVE METABOLIC PANEL - Abnormal; Notable for the following components:      Result Value   CO2 21 (*)    Total Protein 8.2 (*)    All other components within normal limits  URINALYSIS, ROUTINE W REFLEX MICROSCOPIC - Abnormal; Notable for the following components:   APPearance HAZY (*)    Ketones, ur 5 (*)    Protein, ur 30 (*)     Leukocytes,Ua SMALL (*)    All other components within normal limits  RAPID URINE DRUG SCREEN, HOSP PERFORMED - Abnormal; Notable for the following components:   Tetrahydrocannabinol POSITIVE (*)    All other components within normal limits  CBC - Abnormal; Notable for the following components:   WBC 22.1 (*)    RBC 5.74 (*)    MCV 78.6 (*)    MCH 22.0 (*)    MCHC 27.9 (*)  RDW 19.8 (*)    Platelets 459 (*)    All other components within normal limits  I-STAT BETA HCG BLOOD, ED ([facility], [facility], [facility] ONLY) - Abnormal; Notable for the following components:   I-stat hCG, quantitative 7.2 (*)    All other components within normal limits  LIPASE, BLOOD    EKG None  Radiology No results found.  Procedures Procedures (including critical care time)  Medications Ordered in ED Medications  sodium chloride flush (NS) 0.9 % injection 3 mL (3 mLs Intravenous Given 05/19/18 1600)  sodium chloride 0.9 % bolus 1,000 mL (0 mLs Intravenous Stopped 05/19/18 1820)  metoCLOPramide (REGLAN) injection 10 mg (10 mg Intravenous Given 05/19/18 1706)  diphenhydrAMINE (BENADRYL) injection 12.5 mg (12.5 mg Intravenous Given 05/19/18 1706)  ondansetron (ZOFRAN-ODT) disintegrating tablet 4 mg (4 mg Oral Given 05/19/18 1706)  promethazine (PHENERGAN) injection 25 mg (25 mg Intravenous Given 05/19/18 1820)  haloperidol lactate (HALDOL) injection 5 mg (5 mg Intravenous Given 05/19/18 1937)     Initial Impression / Assessment and Plan / ED Course  I have reviewed the triage vital signs and the nursing notes.  Pertinent labs & imaging results that were available during my care of the patient were reviewed by me and considered in my medical decision making (see chart for details).        Pt given IV fluids.  No relief with zofran, reglan or phenergan.  Pt given haldol IV.  Pt reports feeling better.    Final Clinical Impressions(s) / ED Diagnoses   Final diagnoses:  Nausea and vomiting, intractability of vomiting  not specified, unspecified vomiting type  Cannabinoid hyperemesis syndrome [facility])    ED Discharge Orders         Ordered    promethazine (PHENERGAN) 25 MG suppository  Every 6 hours PRN     05/19/18 2123           Sidney Ace 05/19/18 2149    Fransico Meadow, PA-C 05/19/18 2220    Duffy Bruce, MD 05/21/18 1559

## 2018-05-19 NOTE — Discharge Instructions (Addendum)
Return if any problems.  Avoid marijuana as this can cause your symptoms

## 2018-05-19 NOTE — Progress Notes (Signed)
MD attempting to place ultrasound IV at this time. If needed IV consult would be replaced.

## 2018-05-20 DIAGNOSIS — R000 Tachycardia, unspecified: Secondary | ICD-10-CM | POA: Diagnosis not present

## 2018-05-21 ENCOUNTER — Emergency Department (HOSPITAL_COMMUNITY)
Admission: EM | Admit: 2018-05-21 | Discharge: 2018-05-21 | Disposition: A | Discharge status: Home / Self Care | Payer: Medicare Other | Attending: Emergency Medicine | Admitting: Emergency Medicine

## 2018-05-21 ENCOUNTER — Encounter (HOSPITAL_COMMUNITY): Payer: Self-pay

## 2018-05-21 ENCOUNTER — Other Ambulatory Visit: Payer: Self-pay

## 2018-05-21 DIAGNOSIS — R1115 Cyclical vomiting syndrome unrelated to migraine: Secondary | ICD-10-CM | POA: Diagnosis not present

## 2018-05-21 DIAGNOSIS — Z79899 Other long term (current) drug therapy: Secondary | ICD-10-CM | POA: Insufficient documentation

## 2018-05-21 DIAGNOSIS — R1013 Epigastric pain: Secondary | ICD-10-CM | POA: Insufficient documentation

## 2018-05-21 DIAGNOSIS — R000 Tachycardia, unspecified: Secondary | ICD-10-CM | POA: Diagnosis not present

## 2018-05-21 DIAGNOSIS — R112 Nausea with vomiting, unspecified: Secondary | ICD-10-CM | POA: Diagnosis present

## 2018-05-21 LAB — COMPREHENSIVE METABOLIC PANEL
ALT: 15 U/L (ref 0–44)
AST: 17 U/L (ref 15–41)
Albumin: 3.9 g/dL (ref 3.5–5.0)
Alkaline Phosphatase: 99 U/L (ref 38–126)
Anion gap: 9 (ref 5–15)
BUN: 10 mg/dL (ref 6–20)
CO2: 24 mmol/L (ref 22–32)
Calcium: 9.3 mg/dL (ref 8.9–10.3)
Chloride: 106 mmol/L (ref 98–111)
Creatinine, Ser: 0.9 mg/dL (ref 0.44–1.00)
GFR calc Af Amer: >60 mL/min (ref >60)
GFR calc non Af Amer: >60 mL/min (ref >60)
Glucose, Bld: 111 mg/dL — ABNORMAL HIGH (ref 70–99)
Potassium: 3.6 mmol/L (ref 3.5–5.1)
Sodium: 139 mmol/L (ref 135–145)
Total Bilirubin: 0.4 mg/dL (ref 0.3–1.2)
Total Protein: 8.1 g/dL (ref 6.5–8.1)

## 2018-05-21 LAB — CBC WITH DIFFERENTIAL/PLATELET
Abs Immature Granulocytes: 0.07 10*3/uL (ref 0.00–0.07)
Basophils Absolute: 0.1 10*3/uL (ref 0.0–0.1)
Basophils Relative: 0 %
Eosinophils Absolute: 0.2 10*3/uL (ref 0.0–0.5)
Eosinophils Relative: 1 %
HCT: 38 % (ref 36.0–46.0)
Hemoglobin: 11.1 g/dL — ABNORMAL LOW (ref 12.0–15.0)
Immature Granulocytes: 0 %
Lymphocytes Relative: 20 %
Lymphs Abs: 3.5 10*3/uL (ref 0.7–4.0)
MCH: 22.3 pg — ABNORMAL LOW (ref 26.0–34.0)
MCHC: 29.2 g/dL — ABNORMAL LOW (ref 30.0–36.0)
MCV: 76.3 fL — ABNORMAL LOW (ref 80.0–100.0)
Monocytes Absolute: 0.7 10*3/uL (ref 0.1–1.0)
Monocytes Relative: 4 %
Neutro Abs: 13.2 10*3/uL — ABNORMAL HIGH (ref 1.7–7.7)
Neutrophils Relative %: 75 %
Platelets: 395 10*3/uL (ref 150–400)
RBC: 4.98 MIL/uL (ref 3.87–5.11)
RDW: 18.4 % — ABNORMAL HIGH (ref 11.5–15.5)
WBC: 17.7 10*3/uL — ABNORMAL HIGH (ref 4.0–10.5)
nRBC: 0 % (ref 0.0–0.2)

## 2018-05-21 MED ORDER — HALOPERIDOL LACTATE 5 MG/ML IJ SOLN
5.0000 mg | Freq: Once | INTRAMUSCULAR | Status: AC
  Administered 2018-05-21: 5 mg via INTRAVENOUS
  Filled 2018-05-21: qty 1

## 2018-05-21 MED ORDER — DIPHENHYDRAMINE HCL 50 MG/ML IJ SOLN
12.5000 mg | Freq: Once | INTRAMUSCULAR | Status: AC
  Administered 2018-05-21: 12.5 mg via INTRAVENOUS
  Filled 2018-05-21: qty 1

## 2018-05-21 MED ORDER — SODIUM CHLORIDE 0.9 % IV BOLUS
1000.0000 mL | Freq: Once | INTRAVENOUS | Status: AC
  Administered 2018-05-21: 1000 mL via INTRAVENOUS

## 2018-05-21 NOTE — ED Notes (Signed)
Patient given discharge teaching and verbalized understanding. Patient ambulated out of ED with a steady gait. 

## 2018-05-21 NOTE — ED Provider Notes (Signed)
[place] DEPT Provider Note   CSN: [id number] Arrival date & time: 05/21/18  1038    History   Chief Complaint Chief Complaint  Patient presents with   Abdominal Pain    HPI Haley Freeman is a 43 y.o. female with a past medical history of alcohol abuse, gastroparesis, cyclical vomiting syndrome, marijuana use, who presents to ED for flareup of gastroparesis for the past month.  States that symptoms got worse this morning.  She reports pain in the middle of her abdomen which is difficult for her flareups.  States that she has continued nonbloody, nonbilious emesis regardless of the use of her Reglan p.o. and suppositories.  No sick contacts with similar symptoms.  She denies any alcohol, tobacco or other drug use.  States that "they said they found marijuana in my urine but I don't smoke. I never have, I've just been around people that have done it."  Denies any fevers, urinary symptoms, changes to bowel movements.     HPI  Past Medical History:  Diagnosis Date   Alcohol abuse    Anxiety    Chronic anemia 05/04/2018   Depression    Headache(784.0)    Neuromuscular disorder [facility])     Patient Active Problem List   Diagnosis Date Noted   Gastroparesis 05/04/2018   Diarrhea 05/04/2018   Chronic anemia 92/42/6834   Cyclic vomiting syndrome 11/01/2017   Epigastric pain    Nausea & vomiting 10/24/2017   Hypokalemia 09/27/2017   Elevated LFTs 09/27/2017   Abnormal biliary HIDA scan 09/27/2017   Marijuana abuse 09/27/2017   Intractable nausea and vomiting 09/26/2017   Cannabis hyperemesis syndrome concurrent with and due to cannabis abuse ([place]) 09/16/2017   Nausea and vomiting 09/06/2017   Dermoid 08/22/2017    Past Surgical History:  Procedure Laterality Date   CESAREAN SECTION  '99 and '08   x 2   CHOLECYSTECTOMY N/A 10/01/2017   Procedure: LAPAROSCOPIC CHOLECYSTECTOMY WITH POSSIBLE  INTRAOPERATIVE  CHOLANGIOGRAM;  Surgeon: Stark Klein, MD;  Location[facility];  Service: General;  Laterality: N/A;   ESOPHAGOGASTRODUODENOSCOPY N/A 09/16/2017   Procedure: ESOPHAGOGASTRODUODENOSCOPY (EGD) WITH PROPOFOL;  Surgeon: Clarene Essex, MD;  Location: [place];  Service: Endoscopy;  Laterality: N/A;   LAPAROSCOPY Right 08/22/2017   Procedure: LAPAROSCOPY OPERATIVE removal of right ovary and right dermoid cyst;  Surgeon: Sloan Leiter, MD;  Location: [facility];  Service: Gynecology;  Laterality: Right;     OB History    Gravida  3   Para  3   Term      Preterm      AB      Living  3     SAB      TAB      Ectopic      Multiple      Live Births               Home Medications    Prior to Admission medications   Medication Sig Start Date End Date Taking? Authorizing Provider  diphenhydrAMINE (BENADRYL) 25 mg capsule Take 25 mg by mouth every 6 (six) hours as needed for allergies or sleep.   Yes [provider]  ferrous sulfate 325 (65 FE) MG tablet Take 1 tablet (325 mg total) by mouth daily with breakfast. 05/05/18  Yes Shelly Coss, MD  hydrOXYzine (ATARAX/VISTARIL) 25 MG tablet Take 50 mg by mouth at bedtime as needed for nausea or vomiting.  09/20/17  Yes [provider]  metoCLOPramide (REGLAN) 5 MG tablet Take 5 mg by mouth 3 (three) times daily. 05/19/18  Yes [provider]  mirtazapine (REMERON) 45 MG tablet Take 45 mg by mouth at bedtime.  10/16/17  Yes [provider]  pantoprazole (PROTONIX) 40 MG tablet Take 1 tablet (40 mg total) by mouth daily. 04/24/18  Yes Sherwood Gambler, MD  promethazine (PHENERGAN) 25 MG suppository Place 1 suppository (25 mg total) rectally every 6 (six) hours as needed for nausea. 05/19/18 05/19/19 Yes Caryl Ada K, PA-C  sucralfate (CARAFATE) 1 g tablet Take 1 tablet (1 g total) by mouth 4 (four) times daily -  with meals and at bedtime. 04/26/18  Yes Julianne Rice, MD  traZODone (DESYREL) 100 MG tablet  Take 300 mg by mouth at bedtime.    Yes [provider]  triamcinolone cream (KENALOG) 0.5 % Apply 1 application topically 3 (three) times daily as needed (ezcema).   Yes [provider]    Family History Family History  Problem Relation Age of Onset   Hypertension Mother     Social History Social History   Tobacco Use   Smoking status: Former Smoker    Packs/day: 0.50   Smokeless tobacco: Never Used  Substance Use Topics   Alcohol use: No    Comment: occasionally   Drug use: Yes    Types: Other-see comments, Marijuana    Comment: Occasionally smokes marijuana      Allergies   Penicillins and Strawberry (diagnostic)   Review of Systems Review of Systems  Constitutional: Negative for appetite change, chills and fever.  HENT: Negative for ear pain, rhinorrhea, sneezing and sore throat.   Eyes: Negative for photophobia and visual disturbance.  Respiratory: Negative for cough, chest tightness, shortness of breath and wheezing.   Cardiovascular: Negative for chest pain and palpitations.  Gastrointestinal: Positive for abdominal pain, nausea and vomiting. Negative for blood in stool, constipation and diarrhea.  Genitourinary: Negative for dysuria, hematuria and urgency.  Musculoskeletal: Negative for myalgias.  Skin: Negative for rash.  Neurological: Negative for dizziness, weakness and light-headedness.     Physical Exam Updated Vital Signs BP (!) 154/125 (BP Location: Left Arm)    Pulse (!) 103    Temp 98 F (36.7 C) (Oral)    Resp 18    Ht 5\' 8"  (1.727 m)    Wt 127.9 kg    LMP 04/28/2018 Comment: neg preg test 05/12/2018   SpO2 100%    BMI 42.88 kg/m   Physical Exam Vitals signs and nursing note reviewed.  Constitutional:      General: She is not in acute distress.    Appearance: She is well-developed.  HENT:     Head: Normocephalic and atraumatic.     Nose: Nose normal.  Eyes:     General: No scleral icterus.       Left eye: No  discharge.     Conjunctiva/sclera: Conjunctivae normal.  Neck:     Musculoskeletal: Normal range of motion and neck supple.  Cardiovascular:     Rate and Rhythm: Regular rhythm. Tachycardia present.     Heart sounds: Normal heart sounds. No murmur. No friction rub. No gallop.   Pulmonary:     Effort: Pulmonary effort is normal. No respiratory distress.     Breath sounds: Normal breath sounds.  Abdominal:     General: Bowel sounds are normal. There is no distension.     Palpations: Abdomen is soft.     Tenderness: There  is abdominal tenderness in the epigastric area and periumbilical area. There is no guarding.  Musculoskeletal: Normal range of motion.  Skin:    General: Skin is warm and dry.     Findings: No rash.  Neurological:     Mental Status: She is alert.     Motor: No abnormal muscle tone.     Coordination: Coordination normal.      ED Treatments / Results  Labs (all labs ordered are listed, but only abnormal results are displayed) Labs Reviewed  COMPREHENSIVE METABOLIC PANEL - Abnormal; Notable for the following components:      Result Value   Glucose, Bld 111 (*)    All other components within normal limits  CBC WITH DIFFERENTIAL/PLATELET - Abnormal; Notable for the following components:   WBC 17.7 (*)    Hemoglobin 11.1 (*)    MCV 76.3 (*)    MCH 22.3 (*)    MCHC 29.2 (*)    RDW 18.4 (*)    Neutro Abs 13.2 (*)    All other components within normal limits    EKG None  Radiology No results found.  Procedures Procedures (including critical care time)  Medications Ordered in ED Medications  sodium chloride 0.9 % bolus 1,000 mL (1,000 mLs Intravenous New Bag/Given 05/21/18 1219)  haloperidol lactate (HALDOL) injection 5 mg (5 mg Intravenous Given 05/21/18 1204)  diphenhydrAMINE (BENADRYL) injection 12.5 mg (12.5 mg Intravenous Given 05/21/18 1206)     Initial Impression / Assessment and Plan / ED Course  I have reviewed the triage vital signs and the  nursing notes.  Pertinent labs & imaging results that were available during my care of the patient were reviewed by me and considered in my medical decision making (see chart for details).        43 year old female presents to ED for flareup of gastroparesis for the past month.  Symptoms got worse this morning.  States that this feels similar to her prior flareups.  No improvement with her home migraine.  She denies any alcohol, tobacco or other drug use but her prior UDS has been positive for THC.  Labwork significant for leukocytosis of 17 which is similar to priors.  CMP unremarkable.  She was given Haldol, fluids with complete resolution of her symptoms.  She is able to tolerate p.o. intake prior to discharge.  She is requesting discharge home.  Suspect that marijuana use.  Follow-up with PCP, use PRN Zofran return for worsening symptoms.  Patient is hemodynamically stable, in NAD, and able to ambulate in the ED. Evaluation does not show pathology that would require ongoing emergent intervention or inpatient treatment. I explained the diagnosis to the patient. Pain has been managed and has no complaints prior to discharge. Patient is comfortable with above plan and is stable for discharge at this time. All questions were answered prior to disposition. Strict return precautions for returning to the ED were discussed. Encouraged follow up with PCP.   An After Visit Summary was printed and given to the patient.   Portions of this note were generated with [employer]. Dictation errors may occur despite best attempts at proofreading.  Final Clinical Impressions(s) / ED Diagnoses   Final diagnoses:  Cyclical vomiting    ED Discharge Orders    None       Delia Heady, PA-C 05/21/18 [place], DO 05/21/18 1316

## 2018-05-21 NOTE — Discharge Instructions (Addendum)
Take your home medications as needed. Return to the ED if you start to have worsening symptoms, worsening abdominal pain or develop a fever, vomiting blood, blood in stool or trouble breathing.

## 2018-05-21 NOTE — TOC Initial Note (Addendum)
Transition of Care [facility]) - Initial/Assessment Note    Patient Details  Name: Haley Freeman MRN: [id number] Date of Birth: 10/26/1975  [facility] CM/SW Contact:    Erenest Rasher, RN Phone Number: 05/21/2018, 12:44 PM  Clinical Narrative:    5 ED visits in six months             Late entry 05/19/2018  Spoke to pt and states she has GI physician. States was going to [place] but they are currently closed. Arranged appt with [facility] on 06/03/2018 at 1:00 pm. Requested info on Ob-gyn, provider her contact info for [facility]. States she has heavy cycles.     Expected Discharge Plan: Home/Self Care Barriers to Discharge: No Barriers Identified   Patient Goals and CMS Choice        Expected Discharge Plan and Services Expected Discharge Plan: Home/Self Care   Discharge Planning Services: CM Consult   Living arrangements for the past 2 months: Single Family Home                 DME Arranged: N/A                    Prior Living Arrangements/Services Living arrangements for the past 2 months: Single Family Home Lives with:: Minor Children Patient language and need for interpreter reviewed:: Yes Do you feel safe going back to the place where you live?: Yes      Need for Family Participation in Patient Care: No (Comment) Care giver support system in place?: No (comment)   Criminal Activity/Legal Involvement Pertinent to Current Situation/Hospitalization: No - Comment as needed  Activities of Daily Living      Permission Sought/Granted Permission sought to share information with : Case Manager, PCP Permission granted to share information with : Yes, Verbal Permission Granted              Emotional Assessment Appearance:: Appears stated age Attitude/Demeanor/Rapport: Engaged Affect (typically observed): Accepting Orientation: : Oriented to Self, Oriented to Place, Oriented to  Time, Oriented to  Situation Alcohol / Substance Use: Illicit Drugs, Tobacco Use Psych Involvement: No (comment)  Admission diagnosis:  abd pain / emesis  Patient Active Problem List   Diagnosis Date Noted  . Gastroparesis 05/04/2018  . Diarrhea 05/04/2018  . Chronic anemia 05/04/2018  . Cyclic vomiting syndrome 11/01/2017  . Epigastric pain   . Nausea & vomiting 10/24/2017  . Hypokalemia 09/27/2017  . Elevated LFTs 09/27/2017  . Abnormal biliary HIDA scan 09/27/2017  . Marijuana abuse 09/27/2017  . Intractable nausea and vomiting 09/26/2017  . Cannabis hyperemesis syndrome concurrent with and due to cannabis abuse ([place]) 09/16/2017  . Nausea and vomiting 09/06/2017  . Dermoid 08/22/2017   PCP:  Clent Demark, PA-C Pharmacy:   [place], [place] - [address] [place] [address] Phone: [phone] Fax: [phone]  [id number] - [place], [place] [address]  [place] [address] Phone: [phone] Fax: [phone]     Social Determinants of Health (SDOH) Interventions    Readmission Risk Interventions No flowsheet data found.

## 2018-05-21 NOTE — ED Notes (Addendum)
Pt to ED, reports recurrent abdominal pain over the past month. Hx of gastroparesis. Reports N/V. Abdominal pain 10/10.

## 2018-05-22 ENCOUNTER — Encounter (HOSPITAL_COMMUNITY): Payer: Self-pay

## 2018-05-22 ENCOUNTER — Other Ambulatory Visit: Payer: Self-pay

## 2018-05-22 ENCOUNTER — Emergency Department (HOSPITAL_COMMUNITY)
Admission: EM | Admit: 2018-05-22 | Discharge: 2018-05-22 | Disposition: A | Discharge status: Home / Self Care | Payer: Medicare Other | Attending: Emergency Medicine | Admitting: Emergency Medicine

## 2018-05-22 DIAGNOSIS — R112 Nausea with vomiting, unspecified: Secondary | ICD-10-CM | POA: Diagnosis not present

## 2018-05-22 DIAGNOSIS — Z79899 Other long term (current) drug therapy: Secondary | ICD-10-CM | POA: Diagnosis not present

## 2018-05-22 DIAGNOSIS — R1030 Lower abdominal pain, unspecified: Secondary | ICD-10-CM | POA: Diagnosis not present

## 2018-05-22 DIAGNOSIS — R1013 Epigastric pain: Secondary | ICD-10-CM | POA: Insufficient documentation

## 2018-05-22 DIAGNOSIS — Z87891 Personal history of nicotine dependence: Secondary | ICD-10-CM | POA: Diagnosis not present

## 2018-05-22 LAB — CBC WITH DIFFERENTIAL/PLATELET
Abs Immature Granulocytes: 0.12 10*3/uL — ABNORMAL HIGH (ref 0.00–0.07)
Basophils Absolute: 0.1 10*3/uL (ref 0.0–0.1)
Basophils Relative: 0 %
Eosinophils Absolute: 0.1 10*3/uL (ref 0.0–0.5)
Eosinophils Relative: 0 %
HCT: 39 % (ref 36.0–46.0)
Hemoglobin: 11.7 g/dL — ABNORMAL LOW (ref 12.0–15.0)
Immature Granulocytes: 1 %
Lymphocytes Relative: 13 %
Lymphs Abs: 2.5 10*3/uL (ref 0.7–4.0)
MCH: 23 pg — ABNORMAL LOW (ref 26.0–34.0)
MCHC: 30 g/dL (ref 30.0–36.0)
MCV: 76.8 fL — ABNORMAL LOW (ref 80.0–100.0)
Monocytes Absolute: 0.7 10*3/uL (ref 0.1–1.0)
Monocytes Relative: 3 %
Neutro Abs: 16.5 10*3/uL — ABNORMAL HIGH (ref 1.7–7.7)
Neutrophils Relative %: 83 %
Platelets: 420 10*3/uL — ABNORMAL HIGH (ref 150–400)
RBC: 5.08 MIL/uL (ref 3.87–5.11)
RDW: 18.6 % — ABNORMAL HIGH (ref 11.5–15.5)
WBC: 20 10*3/uL — ABNORMAL HIGH (ref 4.0–10.5)
nRBC: 0 % (ref 0.0–0.2)

## 2018-05-22 LAB — COMPREHENSIVE METABOLIC PANEL
ALT: 16 U/L (ref 0–44)
AST: 22 U/L (ref 15–41)
Albumin: 4 g/dL (ref 3.5–5.0)
Alkaline Phosphatase: 104 U/L (ref 38–126)
Anion gap: 11 (ref 5–15)
BUN: 10 mg/dL (ref 6–20)
CO2: 25 mmol/L (ref 22–32)
Calcium: 9.5 mg/dL (ref 8.9–10.3)
Chloride: 103 mmol/L (ref 98–111)
Creatinine, Ser: 1.09 mg/dL — ABNORMAL HIGH (ref 0.44–1.00)
GFR calc Af Amer: >60 mL/min (ref >60)
GFR calc non Af Amer: >60 mL/min (ref >60)
Glucose, Bld: 121 mg/dL — ABNORMAL HIGH (ref 70–99)
Potassium: 3.8 mmol/L (ref 3.5–5.1)
Sodium: 139 mmol/L (ref 135–145)
Total Bilirubin: 0.5 mg/dL (ref 0.3–1.2)
Total Protein: 8.4 g/dL — ABNORMAL HIGH (ref 6.5–8.1)

## 2018-05-22 LAB — RAPID URINE DRUG SCREEN, HOSP PERFORMED
Amphetamines: NOT DETECTED
Barbiturates: NOT DETECTED
Benzodiazepines: NOT DETECTED
Cocaine: NOT DETECTED
Opiates: NOT DETECTED
Tetrahydrocannabinol: POSITIVE — AB

## 2018-05-22 LAB — URINALYSIS, ROUTINE W REFLEX MICROSCOPIC
Bacteria, UA: NONE SEEN
Glucose, UA: NEGATIVE mg/dL
Hgb urine dipstick: NEGATIVE
Ketones, ur: 20 mg/dL — AB
Nitrite: NEGATIVE
Protein, ur: 100 mg/dL — AB
Specific Gravity, Urine: 1.031 — ABNORMAL HIGH (ref 1.005–1.030)
pH: 5 (ref 5.0–8.0)

## 2018-05-22 LAB — I-STAT BETA HCG BLOOD, ED (MC, WL, AP ONLY): I-stat hCG, quantitative: <5 m[IU]/mL (ref <5)

## 2018-05-22 LAB — LIPASE, BLOOD: Lipase: 26 U/L (ref 11–51)

## 2018-05-22 MED ORDER — HALOPERIDOL LACTATE 5 MG/ML IJ SOLN
5.0000 mg | Freq: Once | INTRAMUSCULAR | Status: AC
  Administered 2018-05-22: 5 mg via INTRAVENOUS
  Filled 2018-05-22: qty 1

## 2018-05-22 MED ORDER — PROMETHAZINE HCL 25 MG RE SUPP: 25.0000 mg | Freq: Four times a day (QID) | RECTAL | 0 refills | Status: DC | PRN

## 2018-05-22 MED ORDER — DIPHENHYDRAMINE HCL 50 MG/ML IJ SOLN
12.5000 mg | Freq: Once | INTRAMUSCULAR | Status: AC
  Administered 2018-05-22: 12.5 mg via INTRAVENOUS
  Filled 2018-05-22: qty 1

## 2018-05-22 NOTE — Discharge Instructions (Addendum)
Use phenergan as prescribed as needed. Stay away from all marijuana, even socially.

## 2018-05-22 NOTE — ED Notes (Signed)
Patient refused vitals signs because her daughter is here.

## 2018-05-22 NOTE — ED Triage Notes (Signed)
Patient c/o mid upper abdominal pain and vomiting since yesterday. Patient reports that she was seen yesterday for the same. Patient states she called Dr. Oletta Lamas and was told to come to the ED.

## 2018-05-22 NOTE — ED Provider Notes (Signed)
[place] DEPT Provider Note   CSN: [id number] Arrival date & time: 05/22/18  1612    History   Chief Complaint Chief Complaint  Patient presents with  . Abdominal Pain  . Emesis    HPI Haley Freeman is a 43 y.o. female.     43 yo female with history of cyclic vomiting syndrome, gastroparesis, cannabis hypermesis syndrome presents with complaint of ongoing nausea, vomiting, upper abdominal pain.  Patient states this is been ongoing for the past 2 weeks with multiple ER visits, last seen in the ER yesterday.  Vomiting improved while in the ER with Haldol and Benadryl and then resumes when patient returns home.  Patient reports using Zofran ODT, Phenergan, Reglan without relief of her vomiting.  Symptoms are similar to previous episodes, no fevers, no new complaints.     Past Medical History:  Diagnosis Date  . Alcohol abuse   . Anxiety   . Chronic anemia 05/04/2018  . Depression   . Headache(784.0)   . Neuromuscular disorder [facility])     Patient Active Problem List   Diagnosis Date Noted  . Gastroparesis 05/04/2018  . Diarrhea 05/04/2018  . Chronic anemia 05/04/2018  . Cyclic vomiting syndrome 11/01/2017  . Epigastric pain   . Nausea & vomiting 10/24/2017  . Hypokalemia 09/27/2017  . Elevated LFTs 09/27/2017  . Abnormal biliary HIDA scan 09/27/2017  . Marijuana abuse 09/27/2017  . Intractable nausea and vomiting 09/26/2017  . Cannabis hyperemesis syndrome concurrent with and due to cannabis abuse ([place]) 09/16/2017  . Nausea and vomiting 09/06/2017  . Dermoid 08/22/2017    Past Surgical History:  Procedure Laterality Date  . CESAREAN SECTION  '99 and '08   x 2  . CHOLECYSTECTOMY N/A 10/01/2017   Procedure: LAPAROSCOPIC CHOLECYSTECTOMY WITH POSSIBLE  INTRAOPERATIVE CHOLANGIOGRAM;  Surgeon: Stark Klein, MD;  [facility];  Service: General;  Laterality: N/A;  . ESOPHAGOGASTRODUODENOSCOPY N/A 09/16/2017   Procedure:  ESOPHAGOGASTRODUODENOSCOPY (EGD) WITH PROPOFOL;  Surgeon: Clarene Essex, MD;  Location: [place];  Service: Endoscopy;  Laterality: N/A;  . LAPAROSCOPY Right 08/22/2017   Procedure: LAPAROSCOPY OPERATIVE removal of right ovary and right dermoid cyst;  Surgeon: Sloan Leiter, MD;  Location: [facility];  Service: Gynecology;  Laterality: Right;     OB History    Gravida  3   Para  3   Term      Preterm      AB      Living  3     SAB      TAB      Ectopic      Multiple      Live Births               Home Medications    Prior to Admission medications   Medication Sig Start Date End Date Taking? Authorizing Provider  diphenhydrAMINE (BENADRYL) 25 mg capsule Take 25 mg by mouth every 6 (six) hours as needed for allergies or sleep.   Yes [provider]  ferrous sulfate 325 (65 FE) MG tablet Take 1 tablet (325 mg total) by mouth daily with breakfast. 05/05/18  Yes Shelly Coss, MD  hydrOXYzine (ATARAX/VISTARIL) 25 MG tablet Take 50 mg by mouth at bedtime as needed for nausea or vomiting.  09/20/17  Yes [provider]  metoCLOPramide (REGLAN) 5 MG tablet Take 5 mg by mouth 3 (three) times daily. 05/19/18  Yes [provider]  mirtazapine (REMERON) 45 MG tablet Take 45  mg by mouth at bedtime.  10/16/17  Yes [provider]  pantoprazole (PROTONIX) 40 MG tablet Take 1 tablet (40 mg total) by mouth daily. 04/24/18  Yes Sherwood Gambler, MD  sucralfate (CARAFATE) 1 g tablet Take 1 tablet (1 g total) by mouth 4 (four) times daily -  with meals and at bedtime. 04/26/18  Yes Julianne Rice, MD  traZODone (DESYREL) 100 MG tablet Take 300 mg by mouth at bedtime.    Yes [provider]  triamcinolone cream (KENALOG) 0.5 % Apply 1 application topically 3 (three) times daily as needed (ezcema).   Yes [provider]  promethazine (PHENERGAN) 25 MG suppository Place 1 suppository (25 mg total) rectally every 6 (six) hours as needed for  nausea or vomiting. 05/22/18   Tacy Learn, PA-C    Family History Family History  Problem Relation Age of Onset  . Hypertension Mother     Social History Social History   Tobacco Use  . Smoking status: Former Smoker    Packs/day: 0.50  . Smokeless tobacco: Never Used  Substance Use Topics  . Alcohol use: Not Currently  . Drug use: Yes    Types: Other-see comments, Marijuana    Comment: Occasionally smokes marijuana      Allergies   Penicillins and Strawberry (diagnostic)   Review of Systems Review of Systems  Constitutional: Negative for fever.  Respiratory: Negative for shortness of breath.   Cardiovascular: Negative for chest pain.  Gastrointestinal: Positive for abdominal pain, nausea and vomiting. Negative for diarrhea.  Musculoskeletal: Negative for arthralgias and myalgias.  Skin: Negative for rash and wound.  Allergic/Immunologic: Negative for immunocompromised state.  Neurological: Negative for dizziness and weakness.  Psychiatric/Behavioral: Negative for confusion.  All other systems reviewed and are negative.    Physical Exam Updated Vital Signs BP (!) 142/81 (BP Location: Right Arm)   Pulse 96   Temp 98.4 F (36.9 C) (Oral)   Resp 16   Ht 5\' 8"  (1.727 m)   Wt 127.9 kg   LMP 04/28/2018 Comment: neg preg test 05/12/2018  SpO2 98%   BMI 42.87 kg/m   Physical Exam Vitals signs and nursing note reviewed.  Constitutional:      General: She is not in acute distress.    Appearance: She is well-developed. She is not diaphoretic.  HENT:     Head: Normocephalic and atraumatic.  Cardiovascular:     Rate and Rhythm: Regular rhythm. Tachycardia present.     Heart sounds: Normal heart sounds.  Pulmonary:     Effort: Pulmonary effort is normal.  Abdominal:     Palpations: Abdomen is soft.     Tenderness: There is abdominal tenderness in the epigastric area and left upper quadrant.  Skin:    General: Skin is warm and dry.     Findings: No rash.   Neurological:     Mental Status: She is alert and oriented to person, place, and time.  Psychiatric:        Behavior: Behavior normal.      ED Treatments / Results  Labs (all labs ordered are listed, but only abnormal results are displayed) Labs Reviewed  CBC WITH DIFFERENTIAL/PLATELET - Abnormal; Notable for the following components:      Result Value   WBC 20.0 (*)    Hemoglobin 11.7 (*)    MCV 76.8 (*)    MCH 23.0 (*)    RDW 18.6 (*)    Platelets 420 (*)    Neutro Abs  16.5 (*)    Abs Immature Granulocytes 0.12 (*)    All other components within normal limits  COMPREHENSIVE METABOLIC PANEL - Abnormal; Notable for the following components:   Glucose, Bld 121 (*)    Creatinine, Ser 1.09 (*)    Total Protein 8.4 (*)    All other components within normal limits  URINALYSIS, ROUTINE W REFLEX MICROSCOPIC - Abnormal; Notable for the following components:   Color, Urine AMBER (*)    APPearance CLOUDY (*)    Specific Gravity, Urine 1.031 (*)    Bilirubin Urine SMALL (*)    Ketones, ur 20 (*)    Protein, ur 100 (*)    Leukocytes,Ua SMALL (*)    All other components within normal limits  RAPID URINE DRUG SCREEN, HOSP PERFORMED - Abnormal; Notable for the following components:   Tetrahydrocannabinol POSITIVE (*)    All other components within normal limits  LIPASE, BLOOD  I-STAT BETA HCG BLOOD, ED ([facility], [facility], [facility] ONLY)    EKG None  Radiology No results found.  Procedures Procedures (including critical care time)  Medications Ordered in ED Medications  haloperidol lactate (HALDOL) injection 5 mg (5 mg Intravenous Given 05/22/18 1722)  diphenhydrAMINE (BENADRYL) injection 12.5 mg (12.5 mg Intravenous Given 05/22/18 1723)     Initial Impression / Assessment and Plan / ED Course  I have reviewed the triage vital signs and the nursing notes.  Pertinent labs & imaging results that were available during my care of the patient were reviewed by me and considered in my medical  decision making (see chart for details).  Clinical Course as of May 22 2051  Fri May 22, 3862  5311 43 year old female presents with ongoing nausea and vomiting, seen in the ER multiple times recently for same, history of gastroparesis and cannabinoid hyperemesis.  Patient tried medications at home without relief of her symptoms.  Patient states that she has not smoked marijuana in a long time however was at a picnic this past weekend he was exposed to people who were smoking marijuana.  Patient's pregnancy test is negative, lipase within normal limits, UDS positive for marijuana.  CMP is without significant electrolyte abnormality.  CBC with increased white count at 20,000 which does not vary significantly from patient's previous CBCs.  Urinalysis with increase in ketones and protein compared to yesterday.  Patient was given IV fluids, Haldol, Benadryl.  Patient is feeling much better and is able to tolerate crackers and liquids.  Patient feels she is ready for discharge home at this time, request additional Phenergan suppositories.  Advised patient to avoid marijuana use and socially and follow-up with her gastroenterologist.   [LM]    Clinical Course User Index [LM] Tacy Learn, PA-C      Final Clinical Impressions(s) / ED Diagnoses   Final diagnoses:  Non-intractable vomiting with nausea, unspecified vomiting type    ED Discharge Orders         Ordered    promethazine (PHENERGAN) 25 MG suppository  Every 6 hours PRN     05/22/18 2027           Tacy Learn, PA-C 05/22/18 2053    Valarie Merino, MD 05/25/18 [phone]

## 2018-05-22 NOTE — ED Notes (Signed)
Patient is drinking water without any difficulty. Patient is also eating crackers.

## 2018-05-24 ENCOUNTER — Encounter (HOSPITAL_COMMUNITY): Payer: Self-pay

## 2018-05-24 ENCOUNTER — Emergency Department (HOSPITAL_COMMUNITY)
Admission: EM | Admit: 2018-05-24 | Discharge: 2018-05-24 | Disposition: A | Discharge status: Home / Self Care | Payer: Medicare Other | Source: Home / Self Care | Attending: Emergency Medicine | Admitting: Emergency Medicine

## 2018-05-24 ENCOUNTER — Other Ambulatory Visit: Payer: Self-pay

## 2018-05-24 DIAGNOSIS — Z79899 Other long term (current) drug therapy: Secondary | ICD-10-CM | POA: Insufficient documentation

## 2018-05-24 DIAGNOSIS — Z20828 Contact with and (suspected) exposure to other viral communicable diseases: Secondary | ICD-10-CM | POA: Diagnosis not present

## 2018-05-24 DIAGNOSIS — F12988 Cannabis use, unspecified with other cannabis-induced disorder: Secondary | ICD-10-CM | POA: Diagnosis not present

## 2018-05-24 DIAGNOSIS — R197 Diarrhea, unspecified: Secondary | ICD-10-CM | POA: Diagnosis not present

## 2018-05-24 DIAGNOSIS — Z87891 Personal history of nicotine dependence: Secondary | ICD-10-CM | POA: Insufficient documentation

## 2018-05-24 DIAGNOSIS — R1084 Generalized abdominal pain: Secondary | ICD-10-CM

## 2018-05-24 DIAGNOSIS — R1115 Cyclical vomiting syndrome unrelated to migraine: Secondary | ICD-10-CM | POA: Diagnosis not present

## 2018-05-24 LAB — COMPREHENSIVE METABOLIC PANEL
ALT: 20 U/L (ref 0–44)
AST: 28 U/L (ref 15–41)
Albumin: 4.2 g/dL (ref 3.5–5.0)
Alkaline Phosphatase: 108 U/L (ref 38–126)
Anion gap: 10 (ref 5–15)
BUN: 9 mg/dL (ref 6–20)
CO2: 25 mmol/L (ref 22–32)
Calcium: 9.5 mg/dL (ref 8.9–10.3)
Chloride: 106 mmol/L (ref 98–111)
Creatinine, Ser: 0.98 mg/dL (ref 0.44–1.00)
GFR calc Af Amer: >60 mL/min (ref >60)
GFR calc non Af Amer: >60 mL/min (ref >60)
Glucose, Bld: 109 mg/dL — ABNORMAL HIGH (ref 70–99)
Potassium: 3.5 mmol/L (ref 3.5–5.1)
Sodium: 141 mmol/L (ref 135–145)
Total Bilirubin: 0.5 mg/dL (ref 0.3–1.2)
Total Protein: 8.7 g/dL — ABNORMAL HIGH (ref 6.5–8.1)

## 2018-05-24 LAB — CBC WITH DIFFERENTIAL/PLATELET
Abs Immature Granulocytes: 0.06 10*3/uL (ref 0.00–0.07)
Basophils Absolute: 0.1 10*3/uL (ref 0.0–0.1)
Basophils Relative: 0 %
Eosinophils Absolute: 0.1 10*3/uL (ref 0.0–0.5)
Eosinophils Relative: 0 %
HCT: 40.6 % (ref 36.0–46.0)
Hemoglobin: 11.8 g/dL — ABNORMAL LOW (ref 12.0–15.0)
Immature Granulocytes: 0 %
Lymphocytes Relative: 16 %
Lymphs Abs: 2.4 10*3/uL (ref 0.7–4.0)
MCH: 22.3 pg — ABNORMAL LOW (ref 26.0–34.0)
MCHC: 29.1 g/dL — ABNORMAL LOW (ref 30.0–36.0)
MCV: 76.6 fL — ABNORMAL LOW (ref 80.0–100.0)
Monocytes Absolute: 0.5 10*3/uL (ref 0.1–1.0)
Monocytes Relative: 3 %
Neutro Abs: 12.5 10*3/uL — ABNORMAL HIGH (ref 1.7–7.7)
Neutrophils Relative %: 81 %
Platelets: 240 10*3/uL (ref 150–400)
RBC: 5.3 MIL/uL — ABNORMAL HIGH (ref 3.87–5.11)
RDW: 19 % — ABNORMAL HIGH (ref 11.5–15.5)
WBC: 15.6 10*3/uL — ABNORMAL HIGH (ref 4.0–10.5)
nRBC: 0 % (ref 0.0–0.2)

## 2018-05-24 LAB — LIPASE, BLOOD: Lipase: 31 U/L (ref 11–51)

## 2018-05-24 MED ORDER — HALOPERIDOL LACTATE 5 MG/ML IJ SOLN
5.0000 mg | Freq: Once | INTRAMUSCULAR | Status: AC
  Administered 2018-05-24: 5 mg via INTRAVENOUS
  Filled 2018-05-24: qty 1

## 2018-05-24 MED ORDER — DIPHENHYDRAMINE HCL 50 MG/ML IJ SOLN
25.0000 mg | Freq: Once | INTRAMUSCULAR | Status: AC
  Administered 2018-05-24: 25 mg via INTRAVENOUS
  Filled 2018-05-24: qty 1

## 2018-05-24 MED ORDER — SODIUM CHLORIDE 0.9 % IV BOLUS
1000.0000 mL | Freq: Once | INTRAVENOUS | Status: AC
  Administered 2018-05-24: 1000 mL via INTRAVENOUS

## 2018-05-24 NOTE — ED Provider Notes (Signed)
Patient is, alert, tolerating oral intake, ambulatory. With history of multiple prior similar events, and resolution of current symptoms, patient instructed follow-up closely with outpatient providers.   Carmin Muskrat, MD 05/24/18 [phone]

## 2018-05-24 NOTE — Discharge Instructions (Addendum)
It is important that you follow-up with your physician.  Return here for concerning changes in your condition.

## 2018-05-24 NOTE — ED Provider Notes (Signed)
[facility] DEPT Provider Note   CSN: [id number] Arrival date & time: 05/24/18  1222    History   Chief Complaint Chief Complaint  Patient presents with  . Emesis  . Abdominal Pain    HPI Haley Freeman is a 43 y.o. female.     HPI Patient with history of cyclic vomiting syndrome and gastroparesis presents with vomiting and diarrhea which started yesterday.  States she is been unable to tolerate oral intake.  She complains of upper abdominal pain which she states is similar to previous exacerbations.  She is unable to tolerate Phenergan suppositories.  She has not seen gastroenterology.  Denies blood in the vomit or stool.  No fever or chills. Past Medical History:  Diagnosis Date  . Alcohol abuse   . Anxiety   . Chronic anemia 05/04/2018  . Depression   . Headache(784.0)   . Neuromuscular disorder [facility])     Patient Active Problem List   Diagnosis Date Noted  . Gastroparesis 05/04/2018  . Diarrhea 05/04/2018  . Chronic anemia 05/04/2018  . Cyclic vomiting syndrome 11/01/2017  . Epigastric pain   . Nausea & vomiting 10/24/2017  . Hypokalemia 09/27/2017  . Elevated LFTs 09/27/2017  . Abnormal biliary HIDA scan 09/27/2017  . Marijuana abuse 09/27/2017  . Intractable nausea and vomiting 09/26/2017  . Cannabis hyperemesis syndrome concurrent with and due to cannabis abuse ([place]) 09/16/2017  . Nausea and vomiting 09/06/2017  . Dermoid 08/22/2017    Past Surgical History:  Procedure Laterality Date  . CESAREAN SECTION  '99 and '08   x 2  . CHOLECYSTECTOMY N/A 10/01/2017   Procedure: LAPAROSCOPIC CHOLECYSTECTOMY WITH POSSIBLE  INTRAOPERATIVE CHOLANGIOGRAM;  Surgeon: Stark Klein, MD;  Location: [facility];  Service: General;  Laterality: N/A;  . ESOPHAGOGASTRODUODENOSCOPY N/A 09/16/2017   Procedure: ESOPHAGOGASTRODUODENOSCOPY (EGD) WITH PROPOFOL;  Surgeon: Clarene Essex, MD;  Location: [facility];  Service: Endoscopy;  Laterality: N/A;   . LAPAROSCOPY Right 08/22/2017   Procedure: LAPAROSCOPY OPERATIVE removal of right ovary and right dermoid cyst;  Surgeon: Sloan Leiter, MD;  Location: [place] [facility];  Service: Gynecology;  Laterality: Right;     OB History    Gravida  3   Para  3   Term      Preterm      AB      Living  3     SAB      TAB      Ectopic      Multiple      Live Births               Home Medications    Prior to Admission medications   Medication Sig Start Date End Date Taking? Authorizing Provider  diphenhydrAMINE (BENADRYL) 25 mg capsule Take 25 mg by mouth every 6 (six) hours as needed for allergies or sleep.   Yes [provider]  ferrous sulfate 325 (65 FE) MG tablet Take 1 tablet (325 mg total) by mouth daily with breakfast. 05/05/18  Yes Shelly Coss, MD  hydrOXYzine (ATARAX/VISTARIL) 25 MG tablet Take 50 mg by mouth at bedtime as needed for nausea or vomiting.  09/20/17  Yes [provider]  metoCLOPramide (REGLAN) 5 MG tablet Take 5 mg by mouth 3 (three) times daily. 05/19/18  Yes [provider]  mirtazapine (REMERON) 45 MG tablet Take 45 mg by mouth at bedtime.  10/16/17  Yes [provider]  pantoprazole (PROTONIX) 40 MG tablet Take 1 tablet (40  mg total) by mouth daily. 04/24/18  Yes Sherwood Gambler, MD  promethazine (PHENERGAN) 25 MG suppository Place 1 suppository (25 mg total) rectally every 6 (six) hours as needed for nausea or vomiting. 05/22/18  Yes Tacy Learn, PA-C  sucralfate (CARAFATE) 1 g tablet Take 1 tablet (1 g total) by mouth 4 (four) times daily -  with meals and at bedtime. 04/26/18  Yes Julianne Rice, MD  traZODone (DESYREL) 100 MG tablet Take 300 mg by mouth at bedtime.    Yes [provider]  triamcinolone cream (KENALOG) 0.5 % Apply 1 application topically 3 (three) times daily as needed (ezcema).   Yes [provider]    Family History Family History  Problem Relation Age of Onset  . Hypertension  Mother     Social History Social History   Tobacco Use  . Smoking status: Former Smoker    Packs/day: 0.50  . Smokeless tobacco: Never Used  Substance Use Topics  . Alcohol use: Not Currently  . Drug use: Yes    Types: Other-see comments, Marijuana    Comment: Occasionally smokes marijuana      Allergies   Penicillins and Strawberry (diagnostic)   Review of Systems Review of Systems  Constitutional: Negative for chills and fever.  HENT: Negative for trouble swallowing.   Eyes: Negative for visual disturbance.  Respiratory: Negative for cough and shortness of breath.   Cardiovascular: Negative for chest pain.  Gastrointestinal: Positive for abdominal pain, diarrhea, nausea and vomiting. Negative for constipation.  Genitourinary: Negative for dysuria, flank pain and frequency.  Musculoskeletal: Negative for back pain, myalgias and neck pain.  Skin: Negative for rash and wound.  Neurological: Negative for dizziness, weakness, light-headedness, numbness and headaches.  All other systems reviewed and are negative.    Physical Exam Updated Vital Signs BP (!) 159/110 (BP Location: Right Arm)   Pulse 96   Temp 98.9 F (37.2 C) (Oral)   Resp 18   LMP 04/28/2018 Comment: neg preg test 05/12/2018  SpO2 99%   Physical Exam Vitals signs and nursing note reviewed.  Constitutional:      Appearance: Normal appearance. She is well-developed.     Comments: Actively vomiting  HENT:     Head: Normocephalic and atraumatic.     Nose: Nose normal.     Mouth/Throat:     Mouth: Mucous membranes are moist.  Eyes:     Pupils: Pupils are equal, round, and reactive to light.  Neck:     Musculoskeletal: Normal range of motion and neck supple.  Cardiovascular:     Rate and Rhythm: Normal rate and regular rhythm.     Heart sounds: No murmur. No friction rub. No gallop.   Pulmonary:     Effort: Pulmonary effort is normal. No respiratory distress.     Breath sounds: Normal breath  sounds. No stridor. No wheezing, rhonchi or rales.  Chest:     Chest wall: No tenderness.  Abdominal:     General: Bowel sounds are normal.     Palpations: Abdomen is soft.     Tenderness: There is abdominal tenderness. There is no guarding or rebound.     Comments: Mild generalized abdominal tenderness which appears most pronounced in the epigastrium.  No rebound or guarding.  Musculoskeletal: Normal range of motion.        General: No swelling, tenderness, deformity or signs of injury.     Right lower leg: No edema.     Left lower leg: No  edema.     Comments: No CVA tenderness bilaterally.  Skin:    General: Skin is warm and dry.     Capillary Refill: Capillary refill takes less than 2 seconds.     Findings: No erythema or rash.  Neurological:     General: No focal deficit present.     Mental Status: She is alert and oriented to person, place, and time.  Psychiatric:        Behavior: Behavior normal.      ED Treatments / Results  Labs (all labs ordered are listed, but only abnormal results are displayed) Labs Reviewed  CBC WITH DIFFERENTIAL/PLATELET - Abnormal; Notable for the following components:      Result Value   WBC 15.6 (*)    RBC 5.30 (*)    Hemoglobin 11.8 (*)    MCV 76.6 (*)    MCH 22.3 (*)    MCHC 29.1 (*)    RDW 19.0 (*)    Neutro Abs 12.5 (*)    All other components within normal limits  COMPREHENSIVE METABOLIC PANEL - Abnormal; Notable for the following components:   Glucose, Bld 109 (*)    Total Protein 8.7 (*)    All other components within normal limits  LIPASE, BLOOD    EKG None  Radiology No results found.  Procedures Procedures (including critical care time)  Medications Ordered in ED Medications  haloperidol lactate (HALDOL) injection 5 mg (5 mg Intravenous Given 05/24/18 1431)  diphenhydrAMINE (BENADRYL) injection 25 mg (25 mg Intravenous Given 05/24/18 1430)  sodium chloride 0.9 % bolus 1,000 mL (0 mLs Intravenous Stopped 05/24/18  1726)     Initial Impression / Assessment and Plan / ED Course  I have reviewed the triage vital signs and the nursing notes.  Pertinent labs & imaging results that were available during my care of the patient were reviewed by me and considered in my medical decision making (see chart for details).        Signed out to oncoming emergency physician pending IV fluid and reevaluation after medication.  Final Clinical Impressions(s) / ED Diagnoses   Final diagnoses:  Generalized abdominal pain    ED Discharge Orders    None       Julianne Rice, MD 05/25/18 1614

## 2018-05-24 NOTE — ED Triage Notes (Addendum)
Patient c/o n/v/d and abdominal pain.   Recently seen here on 15th for same.  Over 5 occurences of emesis and diarrhea in past 24 hours.     Hx: of cyclic vomiting syndrome, gastroparesis, cannabis hypermesis syndrome presents with complaint of ongoing nausea, vomiting.   A/Ox4 Ambulatory in triage.

## 2018-05-26 ENCOUNTER — Other Ambulatory Visit: Payer: Self-pay

## 2018-05-26 ENCOUNTER — Encounter (HOSPITAL_COMMUNITY): Payer: Self-pay | Admitting: Emergency Medicine

## 2018-05-26 ENCOUNTER — Inpatient Hospital Stay (HOSPITAL_COMMUNITY)
Admission: EM | Admit: 2018-05-26 | Discharge: 2018-05-29 | DRG: 897 | Disposition: A | Discharge status: Home / Self Care | Payer: Medicare Other | Attending: Internal Medicine | Admitting: Internal Medicine

## 2018-05-26 DIAGNOSIS — E869 Volume depletion, unspecified: Secondary | ICD-10-CM | POA: Diagnosis present

## 2018-05-26 DIAGNOSIS — Z87891 Personal history of nicotine dependence: Secondary | ICD-10-CM

## 2018-05-26 DIAGNOSIS — K3184 Gastroparesis: Secondary | ICD-10-CM | POA: Diagnosis present

## 2018-05-26 DIAGNOSIS — Z20828 Contact with and (suspected) exposure to other viral communicable diseases: Secondary | ICD-10-CM | POA: Diagnosis not present

## 2018-05-26 DIAGNOSIS — F329 Major depressive disorder, single episode, unspecified: Secondary | ICD-10-CM | POA: Diagnosis present

## 2018-05-26 DIAGNOSIS — R1115 Cyclical vomiting syndrome unrelated to migraine: Secondary | ICD-10-CM

## 2018-05-26 DIAGNOSIS — E876 Hypokalemia: Secondary | ICD-10-CM | POA: Diagnosis not present

## 2018-05-26 DIAGNOSIS — Z880 Allergy status to penicillin: Secondary | ICD-10-CM

## 2018-05-26 DIAGNOSIS — F12988 Cannabis use, unspecified with other cannabis-induced disorder: Principal | ICD-10-CM | POA: Diagnosis present

## 2018-05-26 DIAGNOSIS — F12188 Cannabis abuse with other cannabis-induced disorder: Secondary | ICD-10-CM | POA: Diagnosis present

## 2018-05-26 DIAGNOSIS — F419 Anxiety disorder, unspecified: Secondary | ICD-10-CM | POA: Diagnosis present

## 2018-05-26 DIAGNOSIS — Z1159 Encounter for screening for other viral diseases: Secondary | ICD-10-CM

## 2018-05-26 DIAGNOSIS — R197 Diarrhea, unspecified: Secondary | ICD-10-CM | POA: Diagnosis not present

## 2018-05-26 DIAGNOSIS — Z91018 Allergy to other foods: Secondary | ICD-10-CM

## 2018-05-26 DIAGNOSIS — R112 Nausea with vomiting, unspecified: Secondary | ICD-10-CM | POA: Diagnosis present

## 2018-05-26 DIAGNOSIS — Z79899 Other long term (current) drug therapy: Secondary | ICD-10-CM

## 2018-05-26 DIAGNOSIS — Z8249 Family history of ischemic heart disease and other diseases of the circulatory system: Secondary | ICD-10-CM

## 2018-05-26 LAB — CBC WITH DIFFERENTIAL/PLATELET
Abs Immature Granulocytes: 0.07 10*3/uL (ref 0.00–0.07)
Basophils Absolute: 0.1 10*3/uL (ref 0.0–0.1)
Basophils Relative: 0 %
Eosinophils Absolute: 0.2 10*3/uL (ref 0.0–0.5)
Eosinophils Relative: 1 %
HCT: 37.9 % (ref 36.0–46.0)
Hemoglobin: 11.4 g/dL — ABNORMAL LOW (ref 12.0–15.0)
Immature Granulocytes: 1 %
Lymphocytes Relative: 19 %
Lymphs Abs: 2.9 10*3/uL (ref 0.7–4.0)
MCH: 22.8 pg — ABNORMAL LOW (ref 26.0–34.0)
MCHC: 30.1 g/dL (ref 30.0–36.0)
MCV: 76 fL — ABNORMAL LOW (ref 80.0–100.0)
Monocytes Absolute: 0.5 10*3/uL (ref 0.1–1.0)
Monocytes Relative: 3 %
Neutro Abs: 11.8 10*3/uL — ABNORMAL HIGH (ref 1.7–7.7)
Neutrophils Relative %: 76 %
Platelets: 369 10*3/uL (ref 150–400)
RBC: 4.99 MIL/uL (ref 3.87–5.11)
RDW: 18.9 % — ABNORMAL HIGH (ref 11.5–15.5)
WBC: 15.4 10*3/uL — ABNORMAL HIGH (ref 4.0–10.5)
nRBC: 0 % (ref 0.0–0.2)

## 2018-05-26 LAB — URINALYSIS, ROUTINE W REFLEX MICROSCOPIC
Bacteria, UA: NONE SEEN
Bilirubin Urine: NEGATIVE
Glucose, UA: NEGATIVE mg/dL
Hgb urine dipstick: NEGATIVE
Ketones, ur: 20 mg/dL — AB
Nitrite: NEGATIVE
Protein, ur: 30 mg/dL — AB
Specific Gravity, Urine: 1.021 (ref 1.005–1.030)
pH: 6 (ref 5.0–8.0)

## 2018-05-26 LAB — COMPREHENSIVE METABOLIC PANEL
ALT: 19 U/L (ref 0–44)
AST: 21 U/L (ref 15–41)
Albumin: 4.1 g/dL (ref 3.5–5.0)
Alkaline Phosphatase: 99 U/L (ref 38–126)
Anion gap: 10 (ref 5–15)
BUN: 7 mg/dL (ref 6–20)
CO2: 25 mmol/L (ref 22–32)
Calcium: 9.4 mg/dL (ref 8.9–10.3)
Chloride: 106 mmol/L (ref 98–111)
Creatinine, Ser: 0.93 mg/dL (ref 0.44–1.00)
GFR calc Af Amer: >60 mL/min (ref >60)
GFR calc non Af Amer: >60 mL/min (ref >60)
Glucose, Bld: 106 mg/dL — ABNORMAL HIGH (ref 70–99)
Potassium: 3.2 mmol/L — ABNORMAL LOW (ref 3.5–5.1)
Sodium: 141 mmol/L (ref 135–145)
Total Bilirubin: 0.4 mg/dL (ref 0.3–1.2)
Total Protein: 7.9 g/dL (ref 6.5–8.1)

## 2018-05-26 LAB — TROPONIN I: Troponin I: <0.03 ng/mL (ref <0.03)

## 2018-05-26 LAB — CBC
HCT: 38.8 % (ref 36.0–46.0)
Hemoglobin: 11.2 g/dL — ABNORMAL LOW (ref 12.0–15.0)
MCH: 22.3 pg — ABNORMAL LOW (ref 26.0–34.0)
MCHC: 28.9 g/dL — ABNORMAL LOW (ref 30.0–36.0)
MCV: 77.3 fL — ABNORMAL LOW (ref 80.0–100.0)
Platelets: 364 10*3/uL (ref 150–400)
RBC: 5.02 MIL/uL (ref 3.87–5.11)
RDW: 18.9 % — ABNORMAL HIGH (ref 11.5–15.5)
WBC: 18.6 10*3/uL — ABNORMAL HIGH (ref 4.0–10.5)
nRBC: 0 % (ref 0.0–0.2)

## 2018-05-26 LAB — I-STAT BETA HCG BLOOD, ED (MC, WL, AP ONLY): I-stat hCG, quantitative: <5 m[IU]/mL (ref <5)

## 2018-05-26 LAB — SARS CORONAVIRUS 2 BY RT PCR (HOSPITAL ORDER, PERFORMED IN ~~LOC~~ HOSPITAL LAB): SARS Coronavirus 2: NEGATIVE

## 2018-05-26 LAB — LIPASE, BLOOD: Lipase: 35 U/L (ref 11–51)

## 2018-05-26 LAB — CREATININE, SERUM
Creatinine, Ser: 0.89 mg/dL (ref 0.44–1.00)
GFR calc Af Amer: >60 mL/min (ref >60)
GFR calc non Af Amer: >60 mL/min (ref >60)

## 2018-05-26 LAB — MAGNESIUM: Magnesium: 2.5 mg/dL — ABNORMAL HIGH (ref 1.7–2.4)

## 2018-05-26 MED ORDER — HALOPERIDOL LACTATE 5 MG/ML IJ SOLN
5.0000 mg | Freq: Once | INTRAMUSCULAR | Status: AC
  Administered 2018-05-26: 5 mg via INTRAVENOUS
  Filled 2018-05-26: qty 1

## 2018-05-26 MED ORDER — LACTATED RINGERS IV SOLN
INTRAVENOUS | Status: DC
  Administered 2018-05-26 – 2018-05-28 (×3): via INTRAVENOUS

## 2018-05-26 MED ORDER — POTASSIUM CHLORIDE 20 MEQ/15ML (10%) PO SOLN
20.0000 meq | Freq: Once | ORAL | Status: AC
  Administered 2018-05-26: 20 meq via ORAL
  Filled 2018-05-26: qty 15

## 2018-05-26 MED ORDER — SUCRALFATE 1 G PO TABS
1.0000 g | ORAL_TABLET | Freq: Three times a day (TID) | ORAL | Status: DC
  Administered 2018-05-27 – 2018-05-29 (×8): 1 g via ORAL
  Filled 2018-05-26 (×9): qty 1

## 2018-05-26 MED ORDER — ENOXAPARIN SODIUM 60 MG/0.6ML ~~LOC~~ SOLN
60.0000 mg | Freq: Every day | SUBCUTANEOUS | Status: DC
  Administered 2018-05-26 – 2018-05-28 (×3): 60 mg via SUBCUTANEOUS
  Filled 2018-05-26 (×3): qty 0.6

## 2018-05-26 MED ORDER — POTASSIUM CHLORIDE 10 MEQ/100ML IV SOLN
10.0000 meq | INTRAVENOUS | Status: AC
  Administered 2018-05-26 (×2): 10 meq via INTRAVENOUS
  Filled 2018-05-26 (×2): qty 100

## 2018-05-26 MED ORDER — METOCLOPRAMIDE HCL 5 MG/ML IJ SOLN
5.0000 mg | Freq: Four times a day (QID) | INTRAMUSCULAR | Status: DC
  Administered 2018-05-26 – 2018-05-27 (×2): 5 mg via INTRAVENOUS
  Filled 2018-05-26 (×2): qty 2

## 2018-05-26 MED ORDER — MIRTAZAPINE 15 MG PO TABS
45.0000 mg | ORAL_TABLET | Freq: Every day | ORAL | Status: DC
  Administered 2018-05-26 – 2018-05-28 (×3): 45 mg via ORAL
  Filled 2018-05-26 (×3): qty 3

## 2018-05-26 MED ORDER — POTASSIUM CHLORIDE 10 MEQ/100ML IV SOLN
10.0000 meq | Freq: Once | INTRAVENOUS | Status: AC
  Administered 2018-05-26: 10 meq via INTRAVENOUS
  Filled 2018-05-26: qty 100

## 2018-05-26 MED ORDER — PROMETHAZINE HCL 25 MG/ML IJ SOLN
6.2500 mg | Freq: Four times a day (QID) | INTRAMUSCULAR | Status: DC | PRN
  Administered 2018-05-27 – 2018-05-29 (×6): 6.25 mg via INTRAVENOUS
  Filled 2018-05-26 (×6): qty 1

## 2018-05-26 MED ORDER — CAPSAICIN 0.025 % EX CREA
TOPICAL_CREAM | Freq: Once | CUTANEOUS | Status: DC
  Filled 2018-05-26: qty 60

## 2018-05-26 MED ORDER — POTASSIUM CHLORIDE IN NACL 20-0.9 MEQ/L-% IV SOLN
INTRAVENOUS | Status: DC
  Administered 2018-05-26 – 2018-05-27 (×2): via INTRAVENOUS
  Filled 2018-05-26 (×2): qty 1000

## 2018-05-26 MED ORDER — LACTATED RINGERS IV BOLUS
1000.0000 mL | Freq: Once | INTRAVENOUS | Status: AC
  Administered 2018-05-26: 16:00:00 1000 mL via INTRAVENOUS

## 2018-05-26 MED ORDER — FERROUS SULFATE 325 (65 FE) MG PO TABS
325.0000 mg | ORAL_TABLET | Freq: Every day | ORAL | Status: DC
  Administered 2018-05-28 – 2018-05-29 (×2): 325 mg via ORAL
  Filled 2018-05-26 (×3): qty 1

## 2018-05-26 MED ORDER — DIPHENHYDRAMINE HCL 50 MG/ML IJ SOLN
12.5000 mg | Freq: Once | INTRAMUSCULAR | Status: AC
  Administered 2018-05-26: 12.5 mg via INTRAVENOUS
  Filled 2018-05-26: qty 1

## 2018-05-26 NOTE — ED Notes (Signed)
Pt tolerating PO intake, stating that she is feeling much better.

## 2018-05-26 NOTE — H&P (Signed)
History and Physical  Haley Freeman ZOX:[id number] DOB: [birth year] DOA: 05/26/2018  Referring physician: ER provider PCP: Haley Demark, PA-C  Outpatient Specialists: GI, Dr. Oletta Freeman Patient coming from: Home  Chief Complaint: Nausea and vomiting.  HPI: Patient is a 43 year old female past medical history significant for cyclical vomiting.  Patient also carries diagnosis of anxiety, chronic anemia, depression and alcohol abuse.  According to the patient, she has had intermittent nausea and vomiting since April of this year.  Nausea and vomiting got worse within the last 24 hours.  According to the patient, she has vomited up to 20 times.  No associated fever or chills, no diarrhea.  Potassium is noted to be 3.2.  Leukocytosis of 15.4 is noted, urine is positive for ketones and the specific gravity is 1.021.  No headache, no neck pain, no fever or chills, no chest pain, no shortness of breath and no urinary symptoms.  Patient be admitted for further assessment and management.  ED Course: Patient is currently being managed supportively.  Patient has been hydrated.  Potassium is been repleted.  Pertinent labs: Chemistry reveals sodium of 141, potassium of 3.3, chloride 106, CO2 of 25, BUN of 7 and serum creatinine of 0.93 with a blood sugar of 106.  CBC reveals WBC of 15.4, hemoglobin of 11.4, hematocrit of 37.9, MCV of 762 platelet count of 369.  EKG: Independently reviewed.  QTc interval is 468 ms.  Review of Systems:  Negative for fever, visual changes, sore throat, rash, new muscle aches, chest pain, SOB, dysuria, bleeding, abdominal pain.  Past Medical History:  Diagnosis Date  . Alcohol abuse   . Anxiety   . Chronic anemia 05/04/2018  . Depression   . Headache(784.0)   . Neuromuscular disorder [facility])     Past Surgical History:  Procedure Laterality Date  . CESAREAN SECTION  '99 and '08   x 2  . CHOLECYSTECTOMY N/A 10/01/2017   Procedure: LAPAROSCOPIC CHOLECYSTECTOMY  WITH POSSIBLE  INTRAOPERATIVE CHOLANGIOGRAM;  Surgeon: Haley Klein, MD;  Location: [facility];  Service: General;  Laterality: N/A;  . ESOPHAGOGASTRODUODENOSCOPY N/A 09/16/2017   Procedure: ESOPHAGOGASTRODUODENOSCOPY (EGD) WITH PROPOFOL;  Surgeon: Haley Essex, MD;  Location: [facility];  Service: Endoscopy;  Laterality: N/A;  . LAPAROSCOPY Right 08/22/2017   Procedure: LAPAROSCOPY OPERATIVE removal of right ovary and right dermoid cyst;  Surgeon: Sloan Leiter, MD;  Location: [facility];  Service: Gynecology;  Laterality: Right;     reports that she has quit smoking. She smoked 0.50 packs per day. She has never used smokeless tobacco. She reports previous alcohol use. She reports current drug use. Drugs: Other-see comments and Marijuana.  Allergies  Allergen Reactions  . Penicillins Anaphylaxis    Swells throat. Has patient had a PCN reaction causing immediate rash, facial/tongue/throat swelling, SOB or lightheadedness with hypotension: Yes Has patient had a PCN reaction causing severe rash involving mucus membranes or skin necrosis: No Has patient had a PCN reaction that required hospitalization: Yes Has patient had a PCN reaction occurring within the last 10 years: No If all of the above answers are "NO", then may proceed with Cephalosporin use.   Haley Freeman (Diagnostic) Shortness Of Breath    Family History  Problem Relation Age of Onset  . Hypertension Mother      Prior to Admission medications   Medication Sig Start Date End Date Taking? Authorizing Provider  diphenhydrAMINE (BENADRYL) 25 mg capsule Take 25 mg by mouth every 6 (six) hours as needed for allergies  or sleep.   Yes [provider]  ferrous sulfate 325 (65 FE) MG tablet Take 1 tablet (325 mg total) by mouth daily with breakfast. 05/05/18  Yes Shelly Coss, MD  hydrOXYzine (ATARAX/VISTARIL) 25 MG tablet Take 50 mg by mouth at bedtime as needed for nausea or vomiting.  09/20/17  Yes [provider]   metoCLOPramide (REGLAN) 5 MG tablet Take 5 mg by mouth 3 (three) times daily. 05/19/18  Yes [provider]  mirtazapine (REMERON) 45 MG tablet Take 45 mg by mouth at bedtime.  10/16/17  Yes [provider]  pantoprazole (PROTONIX) 40 MG tablet Take 1 tablet (40 mg total) by mouth daily. 04/24/18  Yes Sherwood Gambler, MD  promethazine (PHENERGAN) 25 MG suppository Place 1 suppository (25 mg total) rectally every 6 (six) hours as needed for nausea or vomiting. 05/22/18  Yes Tacy Learn, PA-C  sucralfate (CARAFATE) 1 g tablet Take 1 tablet (1 g total) by mouth 4 (four) times daily -  with meals and at bedtime. 04/26/18  Yes Julianne Rice, MD  traZODone (DESYREL) 100 MG tablet Take 300 mg by mouth at bedtime.    Yes [provider]  triamcinolone cream (KENALOG) 0.5 % Apply 1 application topically 3 (three) times daily as needed (ezcema).   Yes [provider]    Physical Exam: Vitals:   05/26/18 1432 05/26/18 1542 05/26/18 1718 05/26/18 1800  BP: (!) 158/105 128/77 (!) 166/100 (!) 148/82  Pulse: (!) 104 88 97 96  Resp: 20 16 20 17   Temp: 98.5 F (36.9 C)     TempSrc: Oral     SpO2: 100% 100% 100% 100%     Constitutional:  . Appears calm and comfortable Eyes:  . No pallor. No jaundice.  ENMT:  . external ears, nose appear normal Neck:  . Neck is supple. No JVD Respiratory:  . CTA bilaterally, no w/r/r.  . Respiratory effort normal. No retractions or accessory muscle use Cardiovascular:  . S1S2 . No LE extremity edema   Abdomen:  . Abdomen is soft and non tender. Organs are difficult to assess. Neurologic:  . Awake and alert. . Moves all limbs.  Wt Readings from Last 3 Encounters:  05/22/18 127.9 kg  05/21/18 127.9 kg  05/19/18 127.9 kg    I have personally reviewed following labs and imaging studies  Labs on Admission:  CBC: Recent Labs  Lab 05/21/18 1216 05/22/18 1719 05/24/18 1325 05/26/18 1543  WBC 17.7* 20.0* 15.6*  15.4*  NEUTROABS 13.2* 16.5* 12.5* 11.8*  HGB 11.1* 11.7* 11.8* 11.4*  HCT 38.0 39.0 40.6 37.9  MCV 76.3* 76.8* 76.6* 76.0*  PLT 395 420* 240 938   Basic Metabolic Panel: Recent Labs  Lab 05/21/18 1216 05/22/18 1719 05/24/18 1325 05/26/18 1543  NA 139 139 141 141  K 3.6 3.8 3.5 3.2*  CL 106 103 106 106  CO2 24 25 25 25   GLUCOSE 111* 121* 109* 106*  BUN 10 10 9 7   CREATININE 0.90 1.09* 0.98 0.93  CALCIUM 9.3 9.5 9.5 9.4  MG  --   --   --  2.5*   Liver Function Tests: Recent Labs  Lab 05/21/18 1216 05/22/18 1719 05/24/18 1325 05/26/18 1543  AST 17 22 28 21   ALT 15 16 20 19   ALKPHOS 99 104 108 99  BILITOT 0.4 0.5 0.5 0.4  PROT 8.1 8.4* 8.7* 7.9  ALBUMIN 3.9 4.0 4.2 4.1   Recent Labs  Lab 05/22/18 1719 05/24/18 1325 05/26/18  1543  LIPASE 26 31 35   No results for input(s): AMMONIA in the last 168 hours. Coagulation Profile: No results for input(s): INR, PROTIME in the last 168 hours. Cardiac Enzymes: Recent Labs  Lab 05/26/18 1543  TROPONINI <0.03   BNP (last 3 results) No results for input(s): PROBNP in the last 8760 hours. HbA1C: No results for input(s): HGBA1C in the last 72 hours. CBG: No results for input(s): GLUCAP in the last 168 hours. Lipid Profile: No results for input(s): CHOL, HDL, LDLCALC, TRIG, CHOLHDL, LDLDIRECT in the last 72 hours. Thyroid Function Tests: No results for input(s): TSH, T4TOTAL, FREET4, T3FREE, THYROIDAB in the last 72 hours. Anemia Panel: No results for input(s): VITAMINB12, FOLATE, FERRITIN, TIBC, IRON, RETICCTPCT in the last 72 hours. Urine analysis:    Component Value Date/Time   COLORURINE YELLOW 05/26/2018 1544   APPEARANCEUR HAZY (A) 05/26/2018 1544   LABSPEC 1.021 05/26/2018 1544   PHURINE 6.0 05/26/2018 1544   GLUCOSEU NEGATIVE 05/26/2018 1544   HGBUR NEGATIVE 05/26/2018 1544   BILIRUBINUR NEGATIVE 05/26/2018 1544   KETONESUR 20 (A) 05/26/2018 1544   PROTEINUR 30 (A) 05/26/2018 1544   UROBILINOGEN 0.2  09/22/2014 0730   NITRITE NEGATIVE 05/26/2018 1544   LEUKOCYTESUR TRACE (A) 05/26/2018 1544   Sepsis Labs: @LABRCNTIP (procalcitonin:4,lacticidven:4) )No results found for this or any previous visit (from the past 240 hour(s)).    Radiological Exams on Admission: No results found.  EKG: Independently reviewed.   Active Problems:   Cyclical vomiting   Assessment/Plan Persistent nausea and vomiting/cyclical vomiting: Continue volume resuscitation. Continue to monitor and replete abdominal electrolytes Monitor renal function Change Reglan to IV. Continue Phenergan as needed. Monitor QTc interval while on above medications.  Volume depletion: Continue IV fluids as documented above.  Hypokalemia: Monitor and replete.  Further management will depend on hospital course.  DVT prophylaxis: Subacute Lovenox Code Status: Full code Family Communication:  Disposition Plan: Home Consults called: ER provider has already consulted GI Admission status: Observation  Time spent: 65 minutes  Dana Allan, MD  [facility] Pager #: [phone] 7PM-7AM contact night coverage as above  05/26/2018, 7:27 PM

## 2018-05-26 NOTE — ED Notes (Signed)
Called floor  per floor request covid test collected

## 2018-05-26 NOTE — Progress Notes (Signed)
Lovenox per Pharmacy for DVT Prophylaxis    Pharmacy has been consulted from dosing enoxaparin (lovenox) in this patient for DVT prophylaxis.  The pharmacist has reviewed pertinent labs (Hgb _11.4__; PLT_369__), patient weight (_141__kg) and renal function (CrCl_>90__mL/min) and decided that enoxaparin 60__mg SQ Q24Hrs is appropriate for this patient.  The pharmacy department will sign off at this time.  Please reconsult pharmacy if status changes or for further issues.  Thank you  Cyndia Diver PharmD, BCPS  05/26/2018, 10:33 PM

## 2018-05-26 NOTE — ED Notes (Signed)
ED TO INPATIENT HANDOFF REPORT  ED Nurse Name and Phone #: jon wled   S Name/Age/Gender Haley Freeman 43 y.o. female Room/Bed: WA13/WA13  Code Status   Code Status: Prior  Home/SNF/Other Home Patient oriented to: self, place, time and situation Is this baseline? Yes   Triage Complete: Triage complete  Chief Complaint N/V / Dizzy   Triage Note Per patient, states she has been here for the same abdominal pain and vomiting she has had on previous visits-states her GI MD keeps sending her here for eval-states she has'nt been getting better   Allergies Allergies  Allergen Reactions  . Penicillins Anaphylaxis    Swells throat. Has patient had a PCN reaction causing immediate rash, facial/tongue/throat swelling, SOB or lightheadedness with hypotension: Yes Has patient had a PCN reaction causing severe rash involving mucus membranes or skin necrosis: No Has patient had a PCN reaction that required hospitalization: Yes Has patient had a PCN reaction occurring within the last 10 years: No If all of the above answers are "NO", then may proceed with Cephalosporin use.   [name] (Diagnostic) Shortness Of Breath    Level of Care/Admitting Diagnosis ED Disposition    ED Disposition Condition Comment   Admit  Hospital Area: [place] [[id number]]  Level of Care: Telemetry [5]  Admit to tele based on following criteria: Monitor QTC interval  Covid Evaluation: Screening Protocol (No Symptoms)  Diagnosis: Cyclical vomiting [937902]  Admitting Physician: Bonnell Public [[id number]]  Attending Physician: Dana Allan I [[id number]]  PT Class (Do Not Modify): Observation [104]  PT Acc Code (Do Not Modify): Observation [10022]       B Medical/Surgery History Past Medical History:  Diagnosis Date  . Alcohol abuse   . Anxiety   . Chronic anemia 05/04/2018  . Depression   . Headache(784.0)   . Neuromuscular disorder [facility]    Past Surgical History:   Procedure Laterality Date  . CESAREAN SECTION  '99 and '08   x 2  . CHOLECYSTECTOMY N/A 10/01/2017   Procedure: LAPAROSCOPIC CHOLECYSTECTOMY WITH POSSIBLE  INTRAOPERATIVE CHOLANGIOGRAM;  Surgeon: Stark Klein, MD;  Location: [facility];  Service: General;  Laterality: N/A;  . ESOPHAGOGASTRODUODENOSCOPY N/A 09/16/2017   Procedure: ESOPHAGOGASTRODUODENOSCOPY (EGD) WITH PROPOFOL;  Surgeon: Clarene Essex, MD;  Location: [place];  Service: Endoscopy;  Laterality: N/A;  . LAPAROSCOPY Right 08/22/2017   Procedure: LAPAROSCOPY OPERATIVE removal of right ovary and right dermoid cyst;  Surgeon: Sloan Leiter, MD;  Location: [place] [facility];  Service: Gynecology;  Laterality: Right;     A IV Location/Drains/Wounds Patient Lines/Drains/Airways Status   Active Line/Drains/Airways    Name:   Placement date:   Placement time:   Site:   Days:   Peripheral IV 05/26/18 Right Wrist   05/26/18    1536    Wrist   less than 1          Intake/Output Last 24 hours  Intake/Output Summary (Last 24 hours) at 05/26/2018 2053 Last data filed at 05/26/2018 1827 Gross per 24 hour  Intake 1100 ml  Output -  Net 1100 ml    Labs/Imaging Results for orders placed or performed during the hospital encounter of 05/26/18 (from the past 48 hour(s))  Comprehensive metabolic panel     Status: Abnormal   Collection Time: 05/26/18  3:43 PM  Result Value Ref Range   Sodium 141 135 - 145 mmol/L   Potassium 3.2 (L) 3.5 - 5.1 mmol/L   Chloride 106 98 -  111 mmol/L   CO2 25 22 - 32 mmol/L   Glucose, Bld 106 (H) 70 - 99 mg/dL   BUN 7 6 - 20 mg/dL   Creatinine, Ser 0.93 0.44 - 1.00 mg/dL   Calcium 9.4 8.9 - 10.3 mg/dL   Total Protein 7.9 6.5 - 8.1 g/dL   Albumin 4.1 3.5 - 5.0 g/dL   AST 21 15 - 41 U/L   ALT 19 0 - 44 U/L   Alkaline Phosphatase 99 38 - 126 U/L   Total Bilirubin 0.4 0.3 - 1.2 mg/dL   GFR calc non Af Amer >60 >60 mL/min   GFR calc Af Amer >60 >60 mL/min   Anion gap 10 5 - 15    Comment: Performed at [facility], [place] [address], [place], [place] [address]  Lipase, blood     Status: None   Collection Time: 05/26/18  3:43 PM  Result Value Ref Range   Lipase 35 11 - 51 U/L    Comment: Performed at [facility], [place] [address], [place], [place] [address]  Troponin I - Once     Status: None   Collection Time: 05/26/18  3:43 PM  Result Value Ref Range   Troponin I <0.03 <0.03 ng/mL    Comment: Performed at [facility] [place] [address], [place], [place] [address]  CBC with Differential     Status: Abnormal   Collection Time: 05/26/18  3:43 PM  Result Value Ref Range   WBC 15.4 (H) 4.0 - 10.5 K/uL   RBC 4.99 3.87 - 5.11 MIL/uL   Hemoglobin 11.4 (L) 12.0 - 15.0 g/dL   HCT 37.9 36.0 - 46.0 %   MCV 76.0 (L) 80.0 - 100.0 fL   MCH 22.8 (L) 26.0 - 34.0 pg   MCHC 30.1 30.0 - 36.0 g/dL   RDW 18.9 (H) 11.5 - 15.5 %   Platelets 369 150 - 400 K/uL   nRBC 0.0 0.0 - 0.2 %   Neutrophils Relative % 76 %   Neutro Abs 11.8 (H) 1.7 - 7.7 K/uL   Lymphocytes Relative 19 %   Lymphs Abs 2.9 0.7 - 4.0 K/uL   Monocytes Relative 3 %   Monocytes Absolute 0.5 0.1 - 1.0 K/uL   Eosinophils Relative 1 %   Eosinophils Absolute 0.2 0.0 - 0.5 K/uL   Basophils Relative 0 %   Basophils Absolute 0.1 0.0 - 0.1 K/uL   Immature Granulocytes 1 %   Abs Immature Granulocytes 0.07 0.00 - 0.07 K/uL    Comment: Performed at [facility], [place] [address], [place], [place] [address]  Magnesium     Status: Abnormal   Collection Time: 05/26/18  3:43 PM  Result Value Ref Range   Magnesium 2.5 (H) 1.7 - 2.4 mg/dL    Comment: Performed at [facility], [place] [address] [place], [place] [address]  Urinalysis, Routine w reflex microscopic     Status: Abnormal   Collection Time: 05/26/18  3:44 PM  Result Value Ref Range   Color, Urine YELLOW YELLOW   APPearance HAZY (A) CLEAR   Specific Gravity, Urine 1.021 1.005 - 1.030   pH 6.0 5.0 - 8.0    Glucose, UA NEGATIVE NEGATIVE mg/dL   Hgb urine dipstick NEGATIVE NEGATIVE   Bilirubin Urine NEGATIVE NEGATIVE   Ketones, ur 20 (A) NEGATIVE mg/dL   Protein, ur 30 (A) NEGATIVE mg/dL   Nitrite NEGATIVE NEGATIVE   Leukocytes,Ua TRACE (  A) NEGATIVE   RBC / HPF 0-5 0 - 5 RBC/hpf   WBC, UA 0-5 0 - 5 WBC/hpf   Bacteria, UA NONE SEEN NONE SEEN   Squamous Epithelial / LPF 0-5 0 - 5   Mucus PRESENT    Hyaline Casts, UA PRESENT     Comment: Performed at [facility], [place] [address] [place],  [address]  I-Stat beta hCG blood, ED     Status: None   Collection Time: 05/26/18  4:10 PM  Result Value Ref Range   I-stat hCG, quantitative <5.0 <5 mIU/mL   Comment 3            Comment:   GEST. AGE      CONC.  (mIU/mL)   <=1 WEEK        5 - 50     2 WEEKS       50 - 500     3 WEEKS       100 - 10,000     4 WEEKS     1,000 - 30,000        FEMALE AND NON-PREGNANT FEMALE:     LESS THAN 5 mIU/mL    No results found.  Pending Labs [employer] (From admission, onward)    Start     Ordered   05/26/18 1824  SARS Coronavirus 2 (CEPHEID - Performed in [place] hospital lab), [place]  (Asymptomatic Patients Labs)  Once,   R    Question:  Rule Out  Answer:  Yes   05/26/18 1823   Signed and Held  CBC  (enoxaparin (LOVENOX)    CrCl >/= 30 ml/min)  Once,   R    Comments:  Baseline for enoxaparin therapy IF NOT ALREADY DRAWN.  Notify MD if PLT < 100 K.    Signed and Held   Signed and Held  Creatinine, serum  (enoxaparin (LOVENOX)    CrCl >/= 30 ml/min)  Once,   R    Comments:  Baseline for enoxaparin therapy IF NOT ALREADY DRAWN.    Signed and Held   Signed and Held  Creatinine, serum  (enoxaparin (LOVENOX)    CrCl >/= 30 ml/min)  Weekly,   R    Comments:  while on enoxaparin therapy    Signed and Held   Signed and Held  Basic metabolic panel  Tomorrow morning,   R     Signed and Held   Signed and Held  CBC  Tomorrow morning,   R     Signed and Held           Vitals/Pain Today's Vitals   05/26/18 1800 05/26/18 1830 05/26/18 2045 05/26/18 2046  BP: (!) 148/82 135/85 (!) 153/93 (!) 153/93  Pulse: 96 95 87 90  Resp: 17 15 12 20   Temp:    98.7 F (37.1 C)  TempSrc:    Oral  SpO2: 100% 100% 100% 100%  PainSc:    0-No pain    Isolation Precautions No active isolations  Medications Medications  lactated ringers infusion ( Intravenous New Bag/Given 05/26/18 1946)  potassium chloride 10 mEq in 100 mL IVPB (10 mEq Intravenous New Bag/Given 05/26/18 1947)  lactated ringers bolus 1,000 mL (0 mLs Intravenous Stopped 05/26/18 1640)  haloperidol lactate (HALDOL) injection 5 mg (5 mg Intravenous Given 05/26/18 1539)  diphenhydrAMINE (BENADRYL) injection 12.5 mg (12.5 mg Intravenous Given 05/26/18 1539)  potassium chloride 10 mEq in 100 mL IVPB (0 mEq Intravenous Stopped  05/26/18 1827)  potassium chloride 20 MEQ/15ML (10%) solution 20 mEq (20 mEq Oral Given 05/26/18 1727)    Mobility walks Low fall risk   Focused Assessment   R Recommendations: See Admitting Provider Note  Report given to:   Additional Notes:  NPO advance diet slowly

## 2018-05-26 NOTE — ED Provider Notes (Signed)
[facility] DEPT Provider Note   CSN: [id number] Arrival date & time: 05/26/18  1422    History   Chief Complaint Chief Complaint  Patient presents with  . Abdominal Pain    HPI Haley Freeman is a 43 y.o. female.     HPI   Patient is a 43 year old female with past medical history of gastroparesis, cyclical vomiting syndrome, headaches, depression, chronic anemia, anxiety, cannabis use presenting for epigastric pain and repetitive vomiting.  Patient reports that she has had daily vomiting for "a month".  She reports that this is a particular bad episode of her cyclical vomiting.  She reports approximately 20 times emesis/dry heaving per day.  She reports occasional streaks of blood, but has not had any hematemesis this week.  Denies bloody or melanotic stools.  Patient denies any chest pain or shortness of breath.  Patient denies any constipation but does report she had diarrhea yesterday.  Denies dysuria, urgency, frequency, or flank pain.  Patient reports that she takes Reglan, Protonix, and Phenergan rectal suppositories as needed.  She is followed by [facility].  Patient had EGD performed in September 2019 which showed medium size hiatal hernia and patchy erythematous mucosa without active bleeding of the duodenal bulb.  Past Medical History:  Diagnosis Date  . Alcohol abuse   . Anxiety   . Chronic anemia 05/04/2018  . Depression   . Headache(784.0)   . Neuromuscular disorder [facility])     Patient Active Problem List   Diagnosis Date Noted  . Gastroparesis 05/04/2018  . Diarrhea 05/04/2018  . Chronic anemia 05/04/2018  . Cyclic vomiting syndrome 11/01/2017  . Epigastric pain   . Nausea & vomiting 10/24/2017  . Hypokalemia 09/27/2017  . Elevated LFTs 09/27/2017  . Abnormal biliary HIDA scan 09/27/2017  . Marijuana abuse 09/27/2017  . Intractable nausea and vomiting 09/26/2017  . Cannabis hyperemesis syndrome concurrent with and due to  cannabis abuse ([place]) 09/16/2017  . Nausea and vomiting 09/06/2017  . Dermoid 08/22/2017    Past Surgical History:  Procedure Laterality Date  . CESAREAN SECTION  '99 and '08   x 2  . CHOLECYSTECTOMY N/A 10/01/2017   Procedure: LAPAROSCOPIC CHOLECYSTECTOMY WITH POSSIBLE  INTRAOPERATIVE CHOLANGIOGRAM;  Surgeon: Stark Klein, MD;  [facility];  Service: General;  Laterality: N/A;  . ESOPHAGOGASTRODUODENOSCOPY N/A 09/16/2017   Procedure: ESOPHAGOGASTRODUODENOSCOPY (EGD) WITH PROPOFOL;  Surgeon: Clarene Essex, MD;  Location: [place];  Service: Endoscopy;  Laterality: N/A;  . LAPAROSCOPY Right 08/22/2017   Procedure: LAPAROSCOPY OPERATIVE removal of right ovary and right dermoid cyst;  Surgeon: Sloan Leiter, MD;  Location: [facility] [place] [facility];  Service: Gynecology;  Laterality: Right;     OB History    Gravida  3   Para  3   Term      Preterm      AB      Living  3     SAB      TAB      Ectopic      Multiple      Live Births               Home Medications    Prior to Admission medications   Medication Sig Start Date End Date Taking? Authorizing Provider  diphenhydrAMINE (BENADRYL) 25 mg capsule Take 25 mg by mouth every 6 (six) hours as needed for allergies or sleep.   Yes [provider]  ferrous sulfate 325 (65 FE) MG tablet Take 1  tablet (325 mg total) by mouth daily with breakfast. 05/05/18  Yes Shelly Coss, MD  hydrOXYzine (ATARAX/VISTARIL) 25 MG tablet Take 50 mg by mouth at bedtime as needed for nausea or vomiting.  09/20/17  Yes [provider]  metoCLOPramide (REGLAN) 5 MG tablet Take 5 mg by mouth 3 (three) times daily. 05/19/18  Yes [provider]  mirtazapine (REMERON) 45 MG tablet Take 45 mg by mouth at bedtime.  10/16/17  Yes [provider]  pantoprazole (PROTONIX) 40 MG tablet Take 1 tablet (40 mg total) by mouth daily. 04/24/18  Yes Sherwood Gambler, MD  promethazine (PHENERGAN) 25 MG suppository Place 1 suppository  (25 mg total) rectally every 6 (six) hours as needed for nausea or vomiting. 05/22/18  Yes Tacy Learn, PA-C  sucralfate (CARAFATE) 1 g tablet Take 1 tablet (1 g total) by mouth 4 (four) times daily -  with meals and at bedtime. 04/26/18  Yes Julianne Rice, MD  traZODone (DESYREL) 100 MG tablet Take 300 mg by mouth at bedtime.    Yes [provider]  triamcinolone cream (KENALOG) 0.5 % Apply 1 application topically 3 (three) times daily as needed (ezcema).   Yes [provider]    Family History Family History  Problem Relation Age of Onset  . Hypertension Mother     Social History Social History   Tobacco Use  . Smoking status: Former Smoker    Packs/day: 0.50  . Smokeless tobacco: Never Used  Substance Use Topics  . Alcohol use: Not Currently  . Drug use: Yes    Types: Other-see comments, Marijuana    Comment: Occasionally smokes marijuana      Allergies   Penicillins and Strawberry (diagnostic)   Review of Systems Review of Systems  Constitutional: Negative for chills and fever.  HENT: Negative for congestion, rhinorrhea, sinus pain and sore throat.   Eyes: Negative for visual disturbance.  Respiratory: Negative for cough, chest tightness and shortness of breath.   Cardiovascular: Negative for chest pain, palpitations and leg swelling.  Gastrointestinal: Positive for abdominal pain, diarrhea, nausea and vomiting. Negative for constipation.  Genitourinary: Negative for dysuria and flank pain.  Musculoskeletal: Negative for back pain and myalgias.  Skin: Negative for rash.  Neurological: Negative for dizziness, syncope, light-headedness and headaches.     Physical Exam Updated Vital Signs BP (!) 166/100   Pulse 97   Temp 98.5 F (36.9 C) (Oral)   Resp 20   LMP 04/28/2018 Comment: neg preg test 05/12/2018  SpO2 100%   Physical Exam Vitals signs and nursing note reviewed.  Constitutional:      General: She is not in acute distress.     Appearance: She is well-developed.  HENT:     Head: Normocephalic and atraumatic.  Eyes:     Conjunctiva/sclera: Conjunctivae normal.     Pupils: Pupils are equal, round, and reactive to light.  Neck:     Musculoskeletal: Normal range of motion and neck supple.  Cardiovascular:     Rate and Rhythm: Normal rate and regular rhythm.     Heart sounds: S1 normal and S2 normal. No murmur.  Pulmonary:     Effort: Pulmonary effort is normal.     Breath sounds: Normal breath sounds. No wheezing or rales.  Abdominal:     General: There is no distension.     Palpations: Abdomen is soft.     Tenderness: There is abdominal tenderness in the epigastric area. There is no guarding.  Comments: Mild epigastric discomfort without guarding or rebound.   Musculoskeletal: Normal range of motion.        General: No deformity.  Lymphadenopathy:     Cervical: No cervical adenopathy.  Skin:    General: Skin is warm and dry.     Findings: No erythema or rash.  Neurological:     Mental Status: She is alert.     Comments: Cranial nerves grossly intact. Patient moves extremities symmetrically and with good coordination.  Psychiatric:        Behavior: Behavior normal.        Thought Content: Thought content normal.        Judgment: Judgment normal.      ED Treatments / Results  Labs (all labs ordered are listed, but only abnormal results are displayed) Labs Reviewed  COMPREHENSIVE METABOLIC PANEL - Abnormal; Notable for the following components:      Result Value   Potassium 3.2 (*)    Glucose, Bld 106 (*)    All other components within normal limits  CBC WITH DIFFERENTIAL/PLATELET - Abnormal; Notable for the following components:   WBC 15.4 (*)    Hemoglobin 11.4 (*)    MCV 76.0 (*)    MCH 22.8 (*)    RDW 18.9 (*)    Neutro Abs 11.8 (*)    All other components within normal limits  URINALYSIS, ROUTINE W REFLEX MICROSCOPIC - Abnormal; Notable for the following components:   APPearance  HAZY (*)    Ketones, ur 20 (*)    Protein, ur 30 (*)    Leukocytes,Ua TRACE (*)    All other components within normal limits  LIPASE, BLOOD  TROPONIN I  I-STAT BETA HCG BLOOD, ED ([facility], [facility], [facility] ONLY)    EKG EKG Interpretation  Date/Time:  Tuesday May 26 2018 15:55:33 EDT Ventricular Rate:  82 PR Interval:    QRS Duration: 93 QT Interval:  400 QTC Calculation: 468 R Axis:   52 Text Interpretation:  Sinus rhythm Confirmed by Gerlene Fee [phone]) on 05/26/2018 5:31:29 PM   Radiology No results found.  Procedures Procedures (including critical care time)  Medications Ordered in ED Medications  potassium chloride 10 mEq in 100 mL IVPB (10 mEq Intravenous New Bag/Given 05/26/18 1726)  capsaicin (ZOSTRIX) 0.025 % cream (has no administration in time range)  lactated ringers bolus 1,000 mL (0 mLs Intravenous Stopped 05/26/18 1640)  haloperidol lactate (HALDOL) injection 5 mg (5 mg Intravenous Given 05/26/18 1539)  diphenhydrAMINE (BENADRYL) injection 12.5 mg (12.5 mg Intravenous Given 05/26/18 1539)  potassium chloride 20 MEQ/15ML (10%) solution 20 mEq (20 mEq Oral Given 05/26/18 1727)     Initial Impression / Assessment and Plan / ED Course  I have reviewed the triage vital signs and the nursing notes.  Pertinent labs & imaging results that were available during my care of the patient were reviewed by me and considered in my medical decision making (see chart for details).  Clinical Course as of May 25 1841  Tue May 26, 2018  1824 Spoke with Dr. Alessandra Bevels of [facility] who recommends admission and GI will consult in the morning.  This is patient's fifth visit in a week and he feels that given the amount of cyclical vomiting this is reasonable to admit for supportive care.  Appreciate his involvement.   [AM]  Harmon with Dr. Marthenia Rolling who will admit patient. Appreciate his involvement.    [AM]    Clinical Course User Index [AM] Langston Masker  B, PA-C       This is a  43 year old female with extended history of cyclical vomiting syndrome followed by gastroenterology presenting for vomiting episode.  Differential diagnosis includes cyclic vomiting syndrome, cannabis hyperemesis, gastritis, ACS.  Patient does have a family history of early cardiovascular disease and given the 5 visits in 1 week with similar symptoms of epigastric pain and vomiting will check troponin EKG in addition to electrolytes, CBC, and lipase.  Work-up overall reassuring.  Patient has a stable leukocytosis of 15.4.  Hemoglobin stable at 11.4.  Potassium 3.2, repleted.  No evidence of urinary tract infection.  Troponin is negative.  Lipase is normal.  EKG normal sinus rhythm without evidence of ischemia, infarction, or arrhythmia.  Given multiple visits in 1 week, will consult [facility] to facilitate follow-up and discuss case.  Per discussion with [facility], recommends admission and they will consult in morning. Appreciate their involvement. Dr. Marthenia Rolling, [facility] to admit.   Final Clinical Impressions(s) / ED Diagnoses   Final diagnoses:  Cyclical vomiting    ED Discharge Orders    None       Tamala Julian 05/26/18 1843    Maudie Flakes, MD 05/26/18 2318

## 2018-05-26 NOTE — ED Notes (Signed)
Pt ambulatory to bathroom

## 2018-05-26 NOTE — ED Notes (Signed)
Pt provided Ginger ale.

## 2018-05-26 NOTE — ED Triage Notes (Signed)
Per patient, states she has been here for the same abdominal pain and vomiting she has had on previous visits-states her GI MD keeps sending her here for eval-states she has'nt been getting better

## 2018-05-26 NOTE — ED Notes (Signed)
ED TO INPATIENT HANDOFF REPORT  ED Nurse Name and Phone #: [phone]  S Name/Age/Gender Haley Freeman 43 y.o. female Room/Bed: WA13/WA13  Code Status   Code Status: Prior  Home/SNF/Other Home Patient oriented to: self Is this baseline? Yes   Triage Complete: Triage complete  Chief Complaint N/V / Dizzy   Triage Note Per patient, states she has been here for the same abdominal pain and vomiting she has had on previous visits-states her GI MD keeps sending her here for eval-states she has'nt been getting better   Allergies Allergies  Allergen Reactions  . Penicillins Anaphylaxis    Swells throat. Has patient had a PCN reaction causing immediate rash, facial/tongue/throat swelling, SOB or lightheadedness with hypotension: Yes Has patient had a PCN reaction causing severe rash involving mucus membranes or skin necrosis: No Has patient had a PCN reaction that required hospitalization: Yes Has patient had a PCN reaction occurring within the last 10 years: No If all of the above answers are "NO", then may proceed with Cephalosporin use.   [name] (Diagnostic) Shortness Of Breath    Level of Care/Admitting Diagnosis ED Disposition    ED Disposition Condition Comment   Admit  Hospital Area: [facility] [[id number]]  Level of Care: Telemetry [5]  Admit to tele based on following criteria: Monitor QTC interval  Covid Evaluation: Screening Protocol (No Symptoms)  Diagnosis: Cyclical vomiting [761607]  Admitting Physician: Bonnell Public [[id number]]  Attending Physician: Dana Allan I [[id number]]  PT Class (Do Not Modify): Observation [104]  PT Acc Code (Do Not Modify): Observation [10022]       B Medical/Surgery History Past Medical History:  Diagnosis Date  . Alcohol abuse   . Anxiety   . Chronic anemia 05/04/2018  . Depression   . Headache(784.0)   . Neuromuscular disorder [facility])    Past Surgical History:  Procedure Laterality Date  .  CESAREAN SECTION  '99 and '08   x 2  . CHOLECYSTECTOMY N/A 10/01/2017   Procedure: LAPAROSCOPIC CHOLECYSTECTOMY WITH POSSIBLE  INTRAOPERATIVE CHOLANGIOGRAM;  Surgeon: Stark Klein, MD;  Location: [facility];  Service: General;  Laterality: N/A;  . ESOPHAGOGASTRODUODENOSCOPY N/A 09/16/2017   Procedure: ESOPHAGOGASTRODUODENOSCOPY (EGD) WITH PROPOFOL;  Surgeon: Clarene Essex, MD;  Location: [facility];  Service: Endoscopy;  Laterality: N/A;  . LAPAROSCOPY Right 08/22/2017   Procedure: LAPAROSCOPY OPERATIVE removal of right ovary and right dermoid cyst;  Surgeon: Sloan Leiter, MD;  Location: [facility];  Service: Gynecology;  Laterality: Right;     A IV Location/Drains/Wounds Patient Lines/Drains/Airways Status   Active Line/Drains/Airways    Name:   Placement date:   Placement time:   Site:   Days:   Peripheral IV 05/26/18 Right Wrist   05/26/18    1536    Wrist   less than 1          Intake/Output Last 24 hours  Intake/Output Summary (Last 24 hours) at 05/26/2018 2158 Last data filed at 05/26/2018 1827 Gross per 24 hour  Intake 1100 ml  Output -  Net 1100 ml    Labs/Imaging Results for orders placed or performed during the hospital encounter of 05/26/18 (from the past 48 hour(s))  Comprehensive metabolic panel     Status: Abnormal   Collection Time: 05/26/18  3:43 PM  Result Value Ref Range   Sodium 141 135 - 145 mmol/L   Potassium 3.2 (L) 3.5 - 5.1 mmol/L   Chloride 106 98 - 111 mmol/L   CO2  25 22 - 32 mmol/L   Glucose, Bld 106 (H) 70 - 99 mg/dL   BUN 7 6 - 20 mg/dL   Creatinine, Ser 0.93 0.44 - 1.00 mg/dL   Calcium 9.4 8.9 - 10.3 mg/dL   Total Protein 7.9 6.5 - 8.1 g/dL   Albumin 4.1 3.5 - 5.0 g/dL   AST 21 15 - 41 U/L   ALT 19 0 - 44 U/L   Alkaline Phosphatase 99 38 - 126 U/L   Total Bilirubin 0.4 0.3 - 1.2 mg/dL   GFR calc non Af Amer >60 >60 mL/min   GFR calc Af Amer >60 >60 mL/min   Anion gap 10 5 - 15    Comment: Performed at [facility], [place]  [address], [place], [place] [address]  Lipase, blood     Status: None   Collection Time: 05/26/18  3:43 PM  Result Value Ref Range   Lipase 35 11 - 51 U/L    Comment: Performed at [facility], [place] [address] [place], [place] [address]  Troponin I - Once     Status: None   Collection Time: 05/26/18  3:43 PM  Result Value Ref Range   Troponin I <0.03 <0.03 ng/mL    Comment: Performed at [facility], [place] [address], [place], [place] [address]  CBC with Differential     Status: Abnormal   Collection Time: 05/26/18  3:43 PM  Result Value Ref Range   WBC 15.4 (H) 4.0 - 10.5 K/uL   RBC 4.99 3.87 - 5.11 MIL/uL   Hemoglobin 11.4 (L) 12.0 - 15.0 g/dL   HCT 37.9 36.0 - 46.0 %   MCV 76.0 (L) 80.0 - 100.0 fL   MCH 22.8 (L) 26.0 - 34.0 pg   MCHC 30.1 30.0 - 36.0 g/dL   RDW 18.9 (H) 11.5 - 15.5 %   Platelets 369 150 - 400 K/uL   nRBC 0.0 0.0 - 0.2 %   Neutrophils Relative % 76 %   Neutro Abs 11.8 (H) 1.7 - 7.7 K/uL   Lymphocytes Relative 19 %   Lymphs Abs 2.9 0.7 - 4.0 K/uL   Monocytes Relative 3 %   Monocytes Absolute 0.5 0.1 - 1.0 K/uL   Eosinophils Relative 1 %   Eosinophils Absolute 0.2 0.0 - 0.5 K/uL   Basophils Relative 0 %   Basophils Absolute 0.1 0.0 - 0.1 K/uL   Immature Granulocytes 1 %   Abs Immature Granulocytes 0.07 0.00 - 0.07 K/uL    Comment: Performed at [facility], [place] [address], [place], [place] [address]  Magnesium     Status: Abnormal   Collection Time: 05/26/18  3:43 PM  Result Value Ref Range   Magnesium 2.5 (H) 1.7 - 2.4 mg/dL    Comment: Performed at [facility], [place] [address] [place],  [address]  Urinalysis, Routine w reflex microscopic     Status: Abnormal   Collection Time: 05/26/18  3:44 PM  Result Value Ref Range   Color, Urine YELLOW YELLOW   APPearance HAZY (A) CLEAR   Specific Gravity, Urine 1.021 1.005 - 1.030   pH 6.0 5.0 - 8.0   Glucose, UA NEGATIVE NEGATIVE  mg/dL   Hgb urine dipstick NEGATIVE NEGATIVE   Bilirubin Urine NEGATIVE NEGATIVE   Ketones, ur 20 (A) NEGATIVE mg/dL   Protein, ur 30 (A) NEGATIVE mg/dL   Nitrite NEGATIVE NEGATIVE   Leukocytes,Ua TRACE (A) NEGATIVE   RBC /  HPF 0-5 0 - 5 RBC/hpf   WBC, UA 0-5 0 - 5 WBC/hpf   Bacteria, UA NONE SEEN NONE SEEN   Squamous Epithelial / LPF 0-5 0 - 5   Mucus PRESENT    Hyaline Casts, UA PRESENT     Comment: Performed at [facility], [place] [address] [place], [place] [address]  I-Stat beta hCG blood, ED     Status: None   Collection Time: 05/26/18  4:10 PM  Result Value Ref Range   I-stat hCG, quantitative <5.0 <5 mIU/mL   Comment 3            Comment:   GEST. AGE      CONC.  (mIU/mL)   <=1 WEEK        5 - 50     2 WEEKS       50 - 500     3 WEEKS       100 - 10,000     4 WEEKS     1,000 - 30,000        FEMALE AND NON-PREGNANT FEMALE:     LESS THAN 5 mIU/mL   SARS Coronavirus 2 (CEPHEID - Performed in [place] hospital lab), Hosp Order     Status: None   Collection Time: 05/26/18  7:38 PM  Result Value Ref Range   SARS Coronavirus 2 NEGATIVE NEGATIVE    Comment: (NOTE) If result is NEGATIVE SARS-CoV-2 target nucleic acids are NOT DETECTED. The SARS-CoV-2 RNA is generally detectable in upper and lower  respiratory specimens during the acute phase of infection. The lowest  concentration of SARS-CoV-2 viral copies this assay can detect is 250  copies / mL. A negative result does not preclude SARS-CoV-2 infection  and should not be used as the sole basis for treatment or other  patient management decisions.  A negative result may occur with  improper specimen collection / handling, submission of specimen other  than nasopharyngeal swab, presence of viral mutation(s) within the  areas targeted by this assay, and inadequate number of viral copies  (<250 copies / mL). A negative result must be combined with clinical  observations, patient history, and  epidemiological information. If result is POSITIVE SARS-CoV-2 target nucleic acids are DETECTED. The SARS-CoV-2 RNA is generally detectable in upper and lower  respiratory specimens dur ing the acute phase of infection.  Positive  results are indicative of active infection with SARS-CoV-2.  Clinical  correlation with patient history and other diagnostic information is  necessary to determine patient infection status.  Positive results do  not rule out bacterial infection or co-infection with other viruses. If result is PRESUMPTIVE POSTIVE SARS-CoV-2 nucleic acids MAY BE PRESENT.   A presumptive positive result was obtained on the submitted specimen  and confirmed on repeat testing.  While 2019 novel coronavirus  (SARS-CoV-2) nucleic acids may be present in the submitted sample  additional confirmatory testing may be necessary for epidemiological  and / or clinical management purposes  to differentiate between  SARS-CoV-2 and other Sarbecovirus currently known to infect humans.  If clinically indicated additional testing with an alternate test  methodology [phone]) is advised. The SARS-CoV-2 RNA is generally  detectable in upper and lower respiratory sp ecimens during the acute  phase of infection. The expected result is Negative. Fact Sheet for Patients:  StrictlyIdeas.no Fact Sheet for Healthcare Providers: BankingDealers.co.za This test is not yet approved or cleared by the [place] FDA and has been authorized for  detection and/or diagnosis of SARS-CoV-2 by FDA under an Emergency Use Authorization (EUA).  This EUA will remain in effect (meaning this test can be used) for the duration of the COVID-19 declaration under Section 564(b)(1) of the Act, 21 U.S.C. section 360bbb-3(b)(1), unless the authorization is terminated or revoked sooner. Performed at [facility], [place] [address], [place], [place] [address]     No results found.  Pending Labs [employer] (From admission, onward)    Start     Ordered   Signed and Held  CBC  (enoxaparin (LOVENOX)    CrCl >/= 30 ml/min)  Once,   R    Comments:  Baseline for enoxaparin therapy IF NOT ALREADY DRAWN.  Notify MD if PLT < 100 K.    Signed and Held   Signed and Held  Creatinine, serum  (enoxaparin (LOVENOX)    CrCl >/= 30 ml/min)  Once,   R    Comments:  Baseline for enoxaparin therapy IF NOT ALREADY DRAWN.    Signed and Held   Signed and Held  Creatinine, serum  (enoxaparin (LOVENOX)    CrCl >/= 30 ml/min)  Weekly,   R    Comments:  while on enoxaparin therapy    Signed and Held   Signed and Held  Basic metabolic panel  Tomorrow morning,   R     Signed and Held   Signed and Held  CBC  Tomorrow morning,   R     Signed and Held          Vitals/Pain Today's Vitals   05/26/18 1800 05/26/18 1830 05/26/18 2045 05/26/18 2046  BP: (!) 148/82 135/85 (!) 153/93 (!) 153/93  Pulse: 96 95 87 90  Resp: 17 15 12 20   Temp:    98.7 F (37.1 C)  TempSrc:    Oral  SpO2: 100% 100% 100% 100%  PainSc:    0-No pain    Isolation Precautions No active isolations  Medications Medications  lactated ringers infusion ( Intravenous Transfusing/Transfer 05/26/18 2152)  potassium chloride 10 mEq in 100 mL IVPB (0 mEq Intravenous Stopped 05/26/18 2152)  lactated ringers bolus 1,000 mL (0 mLs Intravenous Stopped 05/26/18 1640)  haloperidol lactate (HALDOL) injection 5 mg (5 mg Intravenous Given 05/26/18 1539)  diphenhydrAMINE (BENADRYL) injection 12.5 mg (12.5 mg Intravenous Given 05/26/18 1539)  potassium chloride 10 mEq in 100 mL IVPB (0 mEq Intravenous Stopped 05/26/18 1827)  potassium chloride 20 MEQ/15ML (10%) solution 20 mEq (20 mEq Oral Given 05/26/18 1727)    Mobility walks Low fall risk   Focused Assessments Cardiac Assessment Handoff:    Lab Results  Component Value Date   TROPONINI <0.03 05/26/2018   No results found for: DDIMER Does the  Patient currently have chest pain? No     R Recommendations: See Admitting Provider Note  Report given to:   Additional Notes: c/o N/V

## 2018-05-27 DIAGNOSIS — F419 Anxiety disorder, unspecified: Secondary | ICD-10-CM | POA: Diagnosis present

## 2018-05-27 DIAGNOSIS — Z8249 Family history of ischemic heart disease and other diseases of the circulatory system: Secondary | ICD-10-CM | POA: Diagnosis not present

## 2018-05-27 DIAGNOSIS — Z880 Allergy status to penicillin: Secondary | ICD-10-CM | POA: Diagnosis not present

## 2018-05-27 DIAGNOSIS — Z79899 Other long term (current) drug therapy: Secondary | ICD-10-CM | POA: Diagnosis not present

## 2018-05-27 DIAGNOSIS — R197 Diarrhea, unspecified: Secondary | ICD-10-CM | POA: Diagnosis present

## 2018-05-27 DIAGNOSIS — E869 Volume depletion, unspecified: Secondary | ICD-10-CM | POA: Diagnosis present

## 2018-05-27 DIAGNOSIS — F12188 Cannabis abuse with other cannabis-induced disorder: Secondary | ICD-10-CM | POA: Diagnosis not present

## 2018-05-27 DIAGNOSIS — Z91018 Allergy to other foods: Secondary | ICD-10-CM | POA: Diagnosis not present

## 2018-05-27 DIAGNOSIS — E876 Hypokalemia: Secondary | ICD-10-CM | POA: Diagnosis present

## 2018-05-27 DIAGNOSIS — R1115 Cyclical vomiting syndrome unrelated to migraine: Secondary | ICD-10-CM | POA: Diagnosis not present

## 2018-05-27 DIAGNOSIS — K3184 Gastroparesis: Secondary | ICD-10-CM | POA: Diagnosis present

## 2018-05-27 DIAGNOSIS — F12988 Cannabis use, unspecified with other cannabis-induced disorder: Secondary | ICD-10-CM | POA: Diagnosis present

## 2018-05-27 DIAGNOSIS — F329 Major depressive disorder, single episode, unspecified: Secondary | ICD-10-CM | POA: Diagnosis present

## 2018-05-27 DIAGNOSIS — Z87891 Personal history of nicotine dependence: Secondary | ICD-10-CM | POA: Diagnosis not present

## 2018-05-27 DIAGNOSIS — R112 Nausea with vomiting, unspecified: Secondary | ICD-10-CM | POA: Diagnosis not present

## 2018-05-27 DIAGNOSIS — Z1159 Encounter for screening for other viral diseases: Secondary | ICD-10-CM | POA: Diagnosis not present

## 2018-05-27 DIAGNOSIS — R109 Unspecified abdominal pain: Secondary | ICD-10-CM | POA: Diagnosis not present

## 2018-05-27 LAB — CBC
HCT: 34.1 % — ABNORMAL LOW (ref 36.0–46.0)
Hemoglobin: 10.1 g/dL — ABNORMAL LOW (ref 12.0–15.0)
MCH: 22.9 pg — ABNORMAL LOW (ref 26.0–34.0)
MCHC: 29.6 g/dL — ABNORMAL LOW (ref 30.0–36.0)
MCV: 77.3 fL — ABNORMAL LOW (ref 80.0–100.0)
Platelets: 293 10*3/uL (ref 150–400)
RBC: 4.41 MIL/uL (ref 3.87–5.11)
RDW: 18.5 % — ABNORMAL HIGH (ref 11.5–15.5)
WBC: 12 10*3/uL — ABNORMAL HIGH (ref 4.0–10.5)
nRBC: 0 % (ref 0.0–0.2)

## 2018-05-27 LAB — BASIC METABOLIC PANEL
Anion gap: 7 (ref 5–15)
BUN: 5 mg/dL — ABNORMAL LOW (ref 6–20)
CO2: 24 mmol/L (ref 22–32)
Calcium: 8.7 mg/dL — ABNORMAL LOW (ref 8.9–10.3)
Chloride: 108 mmol/L (ref 98–111)
Creatinine, Ser: 0.81 mg/dL (ref 0.44–1.00)
GFR calc Af Amer: >60 mL/min (ref >60)
GFR calc non Af Amer: >60 mL/min (ref >60)
Glucose, Bld: 79 mg/dL (ref 70–99)
Potassium: 3.7 mmol/L (ref 3.5–5.1)
Sodium: 139 mmol/L (ref 135–145)

## 2018-05-27 MED ORDER — HYDROMORPHONE HCL 1 MG/ML IJ SOLN
1.0000 mg | INTRAMUSCULAR | Status: DC | PRN
  Administered 2018-05-27: 1 mg via INTRAVENOUS
  Filled 2018-05-27 (×2): qty 1

## 2018-05-27 MED ORDER — METOCLOPRAMIDE HCL 5 MG/ML IJ SOLN: 10.0000 mg | Freq: Four times a day (QID) | INTRAMUSCULAR | Status: DC

## 2018-05-27 MED ORDER — TRAZODONE HCL 100 MG PO TABS
300.0000 mg | ORAL_TABLET | Freq: Every day | ORAL | Status: DC
  Administered 2018-05-27 – 2018-05-28 (×2): 300 mg via ORAL
  Filled 2018-05-27 (×2): qty 3

## 2018-05-27 MED ORDER — KETOROLAC TROMETHAMINE 30 MG/ML IJ SOLN
30.0000 mg | Freq: Four times a day (QID) | INTRAMUSCULAR | Status: DC | PRN
  Administered 2018-05-27 – 2018-05-29 (×5): 30 mg via INTRAVENOUS
  Filled 2018-05-27 (×5): qty 1

## 2018-05-27 MED ORDER — MORPHINE SULFATE (PF) 2 MG/ML IV SOLN
0.5000 mg | INTRAVENOUS | Status: DC | PRN
  Administered 2018-05-27: 1 mg via INTRAVENOUS
  Filled 2018-05-27: qty 1

## 2018-05-27 MED ORDER — POTASSIUM CHLORIDE 10 MEQ/100ML IV SOLN
10.0000 meq | INTRAVENOUS | Status: AC
  Administered 2018-05-27 (×2): 10 meq via INTRAVENOUS
  Filled 2018-05-27 (×2): qty 100

## 2018-05-27 NOTE — Progress Notes (Signed)
PROGRESS NOTE    Haley Freeman  DZH:[id number] DOB: 1975-04-14 DOA: 05/26/2018 PCP: Clent Demark, PA-C     Brief Narrative:  Haley Freeman is a 43 year old female past medical history significant for cyclical vomiting.  Patient also carries diagnosis of anxiety, chronic anemia, depression and alcohol abuse.  According to the patient, she has had intermittent nausea and vomiting since April of this year.  Nausea and vomiting got worse within the last 24 hours.  According to the patient, she has vomited up to 20 times.  No associated fever or chills, no diarrhea.  Potassium is noted to be 3.2.  Leukocytosis of 15.4 is noted, urine is positive for ketones and the specific gravity is 1.021.  No headache, no neck pain, no fever or chills, no chest pain, no shortness of breath and no urinary symptoms.  Patient be admitted for further assessment and management.  New events last 24 hours / Subjective: Patient continues to complain of significant nausea, vomiting and epigastric sharp abdominal pain.  Nausea is controlled with IV Phenergan, although when it wears off, she continues to vomit.  She has not been able to keep down any fluids at home.  Pain not controlled with IV morphine this morning.  Assessment & Plan:   Active Problems:   Cyclical vomiting   Persistent intractable nausea, vomiting, cyclical vomiting syndrome, history of gastroparesis -She has been seen in the emergency department for intractable nausea, vomiting 11 times since April 2020.  Also admitted to the hospital in September 2019 for intractable vomiting and underwent EGD at that time which was unrevealing.  She underwent laparoscopic cholecystectomy. -GI consulted -IV fluids -Increase reglan dosing  Hypokalemia -Replaced  Leukocytosis -Improved after IV fluids, no source of infection found.  Afebrile.   DVT prophylaxis: Lovenox Code Status: Full Family Communication: None Disposition Plan:  Pending further GI plan, improvement in symptoms   Consultants:   GI  Procedures:   None   Antimicrobials:  Anti-infectives (From admission, onward)   None       Objective: Vitals:   05/26/18 2100 05/26/18 2130 05/26/18 2228 05/27/18 0406  BP: (!) 146/83 138/90 (!) 152/100 137/82  Pulse: 100 80 79 80  Resp: 17 19 14 16   Temp:   99 F (37.2 C) 98 F (36.7 C)  TempSrc:   Oral Oral  SpO2: 99% 99% 99% 97%  Weight:   (!) 141 kg   Height:   5\' 8"  (1.727 m)     Intake/Output Summary (Last 24 hours) at 05/27/2018 1046 Last data filed at 05/27/2018 1029 Gross per 24 hour  Intake 1750 ml  Output 300 ml  Net 1450 ml   Filed Weights   05/26/18 2228  Weight: (!) 141 kg    Examination:  General exam: Appears calm but uncomfortable  Respiratory system: Clear to auscultation. Respiratory effort normal. Cardiovascular system: S1 & S2 heard, RRR. No JVD, murmurs, rubs, gallops or clicks. No pedal edema. Gastrointestinal system: Abdomen is nondistended, soft and tender to palpation epigastric Central nervous system: Alert and oriented. No focal neurological deficits. Extremities: Symmetric  Skin: No rashes, lesions or ulcers Psychiatry: Judgement and insight appear normal. Mood & affect appropriate.   Data Reviewed: I have personally reviewed following labs and imaging studies  CBC: Recent Labs  Lab 05/21/18 1216 05/22/18 1719 05/24/18 1325 05/26/18 1543 05/26/18 2245 05/27/18 0524  WBC 17.7* 20.0* 15.6* 15.4* 18.6* 12.0*  NEUTROABS 13.2* 16.5* 12.5* 11.8*  --   --  HGB 11.1* 11.7* 11.8* 11.4* 11.2* 10.1*  HCT 38.0 39.0 40.6 37.9 38.8 34.1*  MCV 76.3* 76.8* 76.6* 76.0* 77.3* 77.3*  PLT 395 420* 240 369 364 409   Basic Metabolic Panel: Recent Labs  Lab 05/21/18 1216 05/22/18 1719 05/24/18 1325 05/26/18 1543 05/26/18 2245 05/27/18 0524  NA 139 139 141 141  --  139  K 3.6 3.8 3.5 3.2*  --  3.7  CL 106 103 106 106  --  108  CO2 24 25 25 25   --  24   GLUCOSE 111* 121* 109* 106*  --  79  BUN 10 10 9 7   --  5*  CREATININE 0.90 1.09* 0.98 0.93 0.89 0.81  CALCIUM 9.3 9.5 9.5 9.4  --  8.7*  MG  --   --   --  2.5*  --   --    GFR: Estimated Creatinine Clearance: 133.9 mL/min (by C-G formula based on SCr of 0.81 mg/dL). Liver Function Tests: Recent Labs  Lab 05/21/18 1216 05/22/18 1719 05/24/18 1325 05/26/18 1543  AST 17 22 28 21   ALT 15 16 20 19   ALKPHOS 99 104 108 99  BILITOT 0.4 0.5 0.5 0.4  PROT 8.1 8.4* 8.7* 7.9  ALBUMIN 3.9 4.0 4.2 4.1   Recent Labs  Lab 05/22/18 1719 05/24/18 1325 05/26/18 1543  LIPASE 26 31 35   No results for input(s): AMMONIA in the last 168 hours. Coagulation Profile: No results for input(s): INR, PROTIME in the last 168 hours. Cardiac Enzymes: Recent Labs  Lab 05/26/18 1543  TROPONINI <0.03   BNP (last 3 results) No results for input(s): PROBNP in the last 8760 hours. HbA1C: No results for input(s): HGBA1C in the last 72 hours. CBG: No results for input(s): GLUCAP in the last 168 hours. Lipid Profile: No results for input(s): CHOL, HDL, LDLCALC, TRIG, CHOLHDL, LDLDIRECT in the last 72 hours. Thyroid Function Tests: No results for input(s): TSH, T4TOTAL, FREET4, T3FREE, THYROIDAB in the last 72 hours. Anemia Panel: No results for input(s): VITAMINB12, FOLATE, FERRITIN, TIBC, IRON, RETICCTPCT in the last 72 hours. Sepsis Labs: No results for input(s): PROCALCITON, LATICACIDVEN in the last 168 hours.  Recent Results (from the past 240 hour(s))  SARS Coronavirus 2 (CEPHEID - Performed in [place] hospital lab), Hosp Order     Status: None   Collection Time: 05/26/18  7:38 PM  Result Value Ref Range Status   SARS Coronavirus 2 NEGATIVE NEGATIVE Final    Comment: (NOTE) If result is NEGATIVE SARS-CoV-2 target nucleic acids are NOT DETECTED. The SARS-CoV-2 RNA is generally detectable in upper and lower  respiratory specimens during the acute phase of infection. The lowest   concentration of SARS-CoV-2 viral copies this assay can detect is 250  copies / mL. A negative result does not preclude SARS-CoV-2 infection  and should not be used as the sole basis for treatment or other  patient management decisions.  A negative result may occur with  improper specimen collection / handling, submission of specimen other  than nasopharyngeal swab, presence of viral mutation(s) within the  areas targeted by this assay, and inadequate number of viral copies  (<250 copies / mL). A negative result must be combined with clinical  observations, patient history, and epidemiological information. If result is POSITIVE SARS-CoV-2 target nucleic acids are DETECTED. The SARS-CoV-2 RNA is generally detectable in upper and lower  respiratory specimens dur ing the acute phase of infection.  Positive  results are indicative of active infection  with SARS-CoV-2.  Clinical  correlation with patient history and other diagnostic information is  necessary to determine patient infection status.  Positive results do  not rule out bacterial infection or co-infection with other viruses. If result is PRESUMPTIVE POSTIVE SARS-CoV-2 nucleic acids MAY BE PRESENT.   A presumptive positive result was obtained on the submitted specimen  and confirmed on repeat testing.  While 2019 novel coronavirus  (SARS-CoV-2) nucleic acids may be present in the submitted sample  additional confirmatory testing may be necessary for epidemiological  and / or clinical management purposes  to differentiate between  SARS-CoV-2 and other Sarbecovirus currently known to infect humans.  If clinically indicated additional testing with an alternate test  methodology [phone]) is advised. The SARS-CoV-2 RNA is generally  detectable in upper and lower respiratory sp ecimens during the acute  phase of infection. The expected result is Negative. Fact Sheet for Patients:  StrictlyIdeas.no Fact Sheet  for Healthcare Providers: BankingDealers.co.za This test is not yet approved or cleared by the [place] FDA and has been authorized for detection and/or diagnosis of SARS-CoV-2 by FDA under an Emergency Use Authorization (EUA).  This EUA will remain in effect (meaning this test can be used) for the duration of the COVID-19 declaration under Section 564(b)(1) of the Act, 21 U.S.C. section 360bbb-3(b)(1), unless the authorization is terminated or revoked sooner. Performed at [facility], [place] [address] [place], [place] [address]        Radiology Studies: No results found.    Scheduled Meds: . enoxaparin (LOVENOX) injection  60 mg Subcutaneous QHS  . ferrous sulfate  325 mg Oral Q breakfast  . metoCLOPramide (REGLAN) injection  10 mg Intravenous Q6H  . mirtazapine  45 mg Oral QHS  . sucralfate  1 g Oral TID WC & HS   Continuous Infusions: . lactated ringers Stopped (05/26/18 2250)     LOS: 0 days    Time spent: 35 minutes   Dessa Phi, DO [facility] [email or web address] 05/27/2018, 10:46 AM

## 2018-05-27 NOTE — Consult Note (Signed)
[facility] Gastroenterology Consultation Note  Referring Provider: Dr. Dessa Phi [facility]) Primary Care Physician:  Clent Demark, PA-C Primary Gastroenterologist:  Dr. Laurence Spates  Reason for Consultation:  Abdominal pain, nausea, vomiting  HPI: Haley Freeman is a 43 y.o. female admitted for above reasons.  For the past nearly one year, she has had progressively worsening epigastric pain, nausea, vomiting.  Symptoms can worsen at times with eating, but occurs at rest as well.  Unprompted, patient tells me that symptoms worsen with taking hot showers.  She has had extensive evaluation of these:  4 CT scans in the past 8 months for abdominal pain, 2 of which were in the last month, all unrevealing; cholecystectomy (transient improvement of symptoms, but overall didn't help); EGD September 2019; multiple labs (unrevealing except for leukocytosis and some mild hypokalemia); gastric emptying study last Fall (delayed gastric emptying, which was in setting of inpatient stay with narcotics on board); CT head (unrevealing except partially empty sella).  Patient reports ongoing upper abdominal pain, nausea, vomiting, 20 lbs weight loss.  No improvement of late with phenergan and metoclopramide.  Doesn't have any appreciable "symptom free" episodes, over the past several months.  Denies recent alcohol or narcotics.  Endorses prior use of marijuana, but none recently; positive THC urine screen patient attributes to second hand marijuana exposure.   Past Medical History:  Diagnosis Date  . Alcohol abuse   . Anxiety   . Chronic anemia 05/04/2018  . Depression   . Headache(784.0)   . Neuromuscular disorder [facility])     Past Surgical History:  Procedure Laterality Date  . CESAREAN SECTION  '99 and '08   x 2  . CHOLECYSTECTOMY N/A 10/01/2017   Procedure: LAPAROSCOPIC CHOLECYSTECTOMY WITH POSSIBLE  INTRAOPERATIVE CHOLANGIOGRAM;  Surgeon: Stark Klein, MD;  Location: [facility];  Service: General;   Laterality: N/A;  . ESOPHAGOGASTRODUODENOSCOPY N/A 09/16/2017   Procedure: ESOPHAGOGASTRODUODENOSCOPY (EGD) WITH PROPOFOL;  Surgeon: Clarene Essex, MD;  Location: [facility];  Service: Endoscopy;  Laterality: N/A;  . LAPAROSCOPY Right 08/22/2017   Procedure: LAPAROSCOPY OPERATIVE removal of right ovary and right dermoid cyst;  Surgeon: Sloan Leiter, MD;  Location: [facility];  Service: Gynecology;  Laterality: Right;    Prior to Admission medications   Medication Sig Start Date End Date Taking? Authorizing Provider  diphenhydrAMINE (BENADRYL) 25 mg capsule Take 25 mg by mouth every 6 (six) hours as needed for allergies or sleep.   Yes [provider]  ferrous sulfate 325 (65 FE) MG tablet Take 1 tablet (325 mg total) by mouth daily with breakfast. 05/05/18  Yes Shelly Coss, MD  hydrOXYzine (ATARAX/VISTARIL) 25 MG tablet Take 50 mg by mouth at bedtime as needed for nausea or vomiting.  09/20/17  Yes [provider]  metoCLOPramide (REGLAN) 5 MG tablet Take 5 mg by mouth 3 (three) times daily. 05/19/18  Yes [provider]  mirtazapine (REMERON) 45 MG tablet Take 45 mg by mouth at bedtime.  10/16/17  Yes [provider]  pantoprazole (PROTONIX) 40 MG tablet Take 1 tablet (40 mg total) by mouth daily. 04/24/18  Yes Sherwood Gambler, MD  promethazine (PHENERGAN) 25 MG suppository Place 1 suppository (25 mg total) rectally every 6 (six) hours as needed for nausea or vomiting. 05/22/18  Yes Tacy Learn, PA-C  sucralfate (CARAFATE) 1 g tablet Take 1 tablet (1 g total) by mouth 4 (four) times daily -  with meals and at bedtime. 04/26/18  Yes Julianne Rice, MD  traZODone (  DESYREL) 100 MG tablet Take 300 mg by mouth at bedtime.    Yes [provider]  triamcinolone cream (KENALOG) 0.5 % Apply 1 application topically 3 (three) times daily as needed (ezcema).   Yes [provider]    Current Facility-Administered Medications  Medication Dose Route  Frequency Provider Last Rate Last Dose  . enoxaparin (LOVENOX) injection 60 mg  60 mg Subcutaneous QHS Dana Allan I, MD   60 mg at 05/26/18 2259  . ferrous sulfate tablet 325 mg  325 mg Oral Q breakfast Dana Allan I, MD      . ketorolac (TORADOL) 30 MG/ML injection 30 mg  30 mg Intravenous Q6H PRN Dessa Phi, DO      . lactated ringers infusion   Intravenous Continuous Langston Masker B, PA-C   Stopped at 05/26/18 2250  . metoCLOPramide (REGLAN) injection 10 mg  10 mg Intravenous Q6H Dessa Phi, DO      . mirtazapine (REMERON) tablet 45 mg  45 mg Oral QHS Dana Allan I, MD   45 mg at 05/26/18 2311  . promethazine (PHENERGAN) injection 6.25 mg  6.25 mg Intravenous Q6H PRN Dana Allan I, MD   6.25 mg at 05/27/18 0750  . sucralfate (CARAFATE) tablet 1 g  1 g Oral TID WC & HS Bonnell Public, MD        Allergies as of 05/26/2018 - Review Complete 05/26/2018  Allergen Reaction Noted  . Penicillins Anaphylaxis 11/18/2010  . Strawberry (diagnostic) Shortness Of Breath 09/08/2017    Family History  Problem Relation Age of Onset  . Hypertension Mother     Social History   Socioeconomic History  . Marital status: Divorced    Spouse name: Not on file  . Number of children: Not on file  . Years of education: Not on file  . Highest education level: Not on file  Occupational History  . Not on file  Social Needs  . Financial resource strain: Not on file  . Food insecurity:    Worry: Not on file    Inability: Not on file  . Transportation needs:    Medical: Not on file    Non-medical: Not on file  Tobacco Use  . Smoking status: Former Smoker    Packs/day: 0.50  . Smokeless tobacco: Never Used  Substance and Sexual Activity  . Alcohol use: Not Currently  . Drug use: Yes    Types: Other-see comments, Marijuana    Comment: Occasionally smokes marijuana   . Sexual activity: Not Currently  Lifestyle  . Physical activity:    Days per week: Not on  file    Minutes per session: Not on file  . Stress: Not on file  Relationships  . Social connections:    Talks on phone: Not on file    Gets together: Not on file    Attends religious service: Not on file    Active member of club or organization: Not on file    Attends meetings of clubs or organizations: Not on file    Relationship status: Not on file  . Intimate partner violence:    Fear of current or ex partner: Not on file    Emotionally abused: Not on file    Physically abused: Not on file    Forced sexual activity: Not on file  Other Topics Concern  . Not on file  Social History Narrative  . Not on file    Review of Systems: As per HPI, all others  negative  Physical Exam: Vital signs in last 24 hours: Temp:  [98 F (36.7 C)-99 F (37.2 C)] 98 F (36.7 C) (05/20 0406) Pulse Rate:  [79-104] 80 (05/20 0406) Resp:  [12-20] 16 (05/20 0406) BP: (128-166)/(77-105) 137/82 (05/20 0406) SpO2:  [97 %-100 %] 97 % (05/20 0406) Weight:  [141 kg] 141 kg (05/19 2228)   General:   Alert,  Obese, and cooperative in NAD Head:  Normocephalic and atraumatic. Eyes:  Sclera clear, no icterus.   Conjunctiva pink. Ears:  Normal auditory acuity. Nose:  No deformity, discharge,  or lesions. Mouth:  No deformity or lesions.  Oropharynx pink & moist. Neck:  Supple; no masses or thyromegaly. Lungs:  Clear throughout to auscultation.   No wheezes, crackles, or rhonchi. No acute distress. Heart:  Regular rate and rhythm; no murmurs, clicks, rubs,  or gallops. Abdomen:  Soft, protuberant, epigastric tenderness without peritonitis. No masses, hepatosplenomegaly or hernias noted. Normal bowel sounds, without guarding, and without rebound.     Msk:  Symmetrical without gross deformities. Normal posture. Pulses:  Normal pulses noted. Extremities:  Without clubbing or edema. Neurologic:  Alert and  oriented x4;  grossly normal neurologically. Skin:  Intact without significant lesions or  rashes. Cervical Nodes:  No significant cervical adenopathy. Psych:  Alert and cooperative; frustrated about her lack of improvement   Lab Results: Recent Labs    05/26/18 1543 05/26/18 2245 05/27/18 0524  WBC 15.4* 18.6* 12.0*  HGB 11.4* 11.2* 10.1*  HCT 37.9 38.8 34.1*  PLT 369 364 293   BMET Recent Labs    05/24/18 1325 05/26/18 1543 05/26/18 2245 05/27/18 0524  NA 141 141  --  139  K 3.5 3.2*  --  3.7  CL 106 106  --  108  CO2 25 25  --  24  GLUCOSE 109* 106*  --  79  BUN 9 7  --  5*  CREATININE 0.98 0.93 0.89 0.81  CALCIUM 9.5 9.4  --  8.7*   LFT Recent Labs    05/26/18 1543  PROT 7.9  ALBUMIN 4.1  AST 21  ALT 19  ALKPHOS 99  BILITOT 0.4   PT/INR No results for input(s): LABPROT, INR in the last 72 hours.  Studies/Results: No results found.  Impression:  1.  Progressive upper abdominal pain. 2.  Progressive nausea and vomiting. 3.  THC urine positive screen.  Doubt from second hand exposure; second hand exposure very rarely causes urine tests to be positive, and typically only when exposure to very large amounts of marijuana and only typically in the next few hours after the exposure. 4.  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  Cyclical vomiting syndrome (CVS) seems less likely, as patient's symptoms have been progressively worsening and there haven't been many symptom-free episodes of late.  There is also a subset of CVS that can be triggered by [facility] and can persist even with THC cessation, but given no significant symptom-free intervals, I doubt CVS.  5.  Patient may have component of gastroparesis, but delayed gastric emptying itself can be a manifestation of marijuana use and it is hard to differentiate idiopathic delayed gastric emptying from this entity, and the distinction doesn't appreciably affect management.   Plan:  1.  Long-term metoclopramide is not advised, for fear of extrapyramidal side effects,  especially in someone with history of depression.  Since this is not helping, I would stop the metoclopramide. 2.  Short-term use of erythromycin is a  possibility over the short-term, but habituation is frequent with long-term use, thus not advised in that setting. 3.  Minimize use of narcotics, will make vomiting worse. 4.  Stop marijuana use. 5.  Given multiple prior evaluations, I don't see the utility in further testing at this time.  Would try clear liquids when her pain has improved a bit, and slowly advance.  Complete resolution of symptoms is neither feasible nor likely as a standard measure for this patient's discharge. 6.  Tertiary care center evaluation as an outpatient.  They might consider further motility testing or even further neurologic testing to exclude highly unlikely possibility of some type of neurologic process at root of patient's symptoms. 7.  [facility] will sign-off; case discussed with Dr. Maylene Roes; please call with any further questions; thank you for the consultation.   LOS: 0 days   Mertha Clyatt M  05/27/2018, 11:01 AM  Cell [phone] If no answer or after 5 PM call [phone]

## 2018-05-28 DIAGNOSIS — F12188 Cannabis abuse with other cannabis-induced disorder: Secondary | ICD-10-CM

## 2018-05-28 LAB — CBC
HCT: 37.6 % (ref 36.0–46.0)
Hemoglobin: 11.1 g/dL — ABNORMAL LOW (ref 12.0–15.0)
MCH: 22.5 pg — ABNORMAL LOW (ref 26.0–34.0)
MCHC: 29.5 g/dL — ABNORMAL LOW (ref 30.0–36.0)
MCV: 76.1 fL — ABNORMAL LOW (ref 80.0–100.0)
Platelets: 347 10*3/uL (ref 150–400)
RBC: 4.94 MIL/uL (ref 3.87–5.11)
RDW: 18.6 % — ABNORMAL HIGH (ref 11.5–15.5)
WBC: 14.4 10*3/uL — ABNORMAL HIGH (ref 4.0–10.5)
nRBC: 0 % (ref 0.0–0.2)

## 2018-05-28 LAB — BASIC METABOLIC PANEL
Anion gap: 8 (ref 5–15)
BUN: 6 mg/dL (ref 6–20)
CO2: 25 mmol/L (ref 22–32)
Calcium: 9.1 mg/dL (ref 8.9–10.3)
Chloride: 106 mmol/L (ref 98–111)
Creatinine, Ser: 1.05 mg/dL — ABNORMAL HIGH (ref 0.44–1.00)
GFR calc Af Amer: >60 mL/min (ref >60)
GFR calc non Af Amer: >60 mL/min (ref >60)
Glucose, Bld: 96 mg/dL (ref 70–99)
Potassium: 3.4 mmol/L — ABNORMAL LOW (ref 3.5–5.1)
Sodium: 139 mmol/L (ref 135–145)

## 2018-05-28 LAB — MAGNESIUM: Magnesium: 2.3 mg/dL (ref 1.7–2.4)

## 2018-05-28 MED ORDER — ALUM & MAG HYDROXIDE-SIMETH 200-200-20 MG/5ML PO SUSP
30.0000 mL | Freq: Once | ORAL | Status: AC
  Administered 2018-05-28: 30 mL via ORAL
  Filled 2018-05-28: qty 30

## 2018-05-28 MED ORDER — POTASSIUM CHLORIDE CRYS ER 20 MEQ PO TBCR
40.0000 meq | EXTENDED_RELEASE_TABLET | Freq: Once | ORAL | Status: AC
  Administered 2018-05-28: 40 meq via ORAL
  Filled 2018-05-28: qty 2

## 2018-05-28 NOTE — Progress Notes (Signed)
PROGRESS NOTE    Haley Freeman  VVO:[id number] DOB: 04-07-1975 DOA: 05/26/2018 PCP: Clent Demark, PA-C     Brief Narrative:  Haley Freeman is a 43 year old female past medical history significant for cyclical vomiting.  Patient also carries diagnosis of anxiety, chronic anemia, depression and alcohol abuse.  According to the patient, she has had intermittent nausea and vomiting since April of this year.  Nausea and vomiting got worse within the last 24 hours.  According to the patient, she has vomited up to 20 times.  No associated fever or chills, no diarrhea.  Potassium is noted to be 3.2.  Leukocytosis of 15.4 is noted, urine is positive for ketones and the specific gravity is 1.021.  No headache, no neck pain, no fever or chills, no chest pain, no shortness of breath and no urinary symptoms.  Patient be admitted for further assessment and management.  Patient seen and examined by GI.  Overall patient's history is most consistent with cannabis hyperemesis syndrome.  New events last 24 hours / Subjective: Continues to have "10 out of 10" abdominal pain and unable to keep down any food or liquid.  No appetite.  She states that she takes hot showers because it is "in her culture" to do so when not feeling well.  Hot water feels good on her stomach but she does not believe that this is an indication of cannabis hyperemesis syndrome.  Assessment & Plan:   Active Problems:   Cyclical vomiting   Persistent intractable nausea, vomiting, history of gastroparesis -She has been seen in the emergency department for intractable nausea, vomiting 11 times since April 2020.  Also admitted to the hospital in September 2019 for intractable vomiting and underwent EGD at that time which was unrevealing.  She underwent laparoscopic cholecystectomy. -GI consulted, patient's overall history most consistent with cannabis hyperemesis syndrome.  No further inpatient work-up planned.  She may  pursue tertiary care center evaluation as an outpatient. -IV fluids -No narcotics  Hypokalemia -Replaced  Leukocytosis -No source of infection found.  Afebrile.   DVT prophylaxis: Lovenox Code Status: Full Family Communication: None Disposition Plan: Pending improvement in symptoms   Consultants:   GI  Procedures:   None   Antimicrobials:  Anti-infectives (From admission, onward)   None       Objective: Vitals:   05/27/18 0406 05/27/18 1344 05/27/18 2047 05/28/18 0529  BP: 137/82 (!) 141/83 108/72 116/77  Pulse: 80 91 83 89  Resp: 16 16 16 16   Temp: 98 F (36.7 C) 98.5 F (36.9 C) 98.9 F (37.2 C) 99.2 F (37.3 C)  TempSrc: Oral Oral Oral Oral  SpO2: 97% 100% 98% 99%  Weight:      Height:        Intake/Output Summary (Last 24 hours) at 05/28/2018 1219 Last data filed at 05/27/2018 1700 Gross per 24 hour  Intake -  Output 50 ml  Net -50 ml   Filed Weights   05/26/18 2228  Weight: (!) 141 kg    Examination: General exam: Appears calm and uncomfortable Respiratory system: Clear to auscultation. Respiratory effort normal. Cardiovascular system: S1 & S2 heard, RRR. No JVD, murmurs, rubs, gallops or clicks. No pedal edema. Gastrointestinal system: Abdomen is nondistended, soft and tender to palpation epigastric. No organomegaly or masses felt. Normal bowel sounds heard. Central nervous system: Alert and oriented. No focal neurological deficits. Extremities: Symmetric 5 x 5 power. Skin: No rashes, lesions or ulcers Psychiatry: Judgement and insight appear normal.  Mood & affect appropriate.   Data Reviewed: I have personally reviewed following labs and imaging studies  CBC: Recent Labs  Lab 05/22/18 1719 05/24/18 1325 05/26/18 1543 05/26/18 2245 05/27/18 0524 05/28/18 0608  WBC 20.0* 15.6* 15.4* 18.6* 12.0* 14.4*  NEUTROABS 16.5* 12.5* 11.8*  --   --   --   HGB 11.7* 11.8* 11.4* 11.2* 10.1* 11.1*  HCT 39.0 40.6 37.9 38.8 34.1* 37.6  MCV  76.8* 76.6* 76.0* 77.3* 77.3* 76.1*  PLT 420* 240 369 364 293 130   Basic Metabolic Panel: Recent Labs  Lab 05/22/18 1719 05/24/18 1325 05/26/18 1543 05/26/18 2245 05/27/18 0524 05/28/18 0608  NA 139 141 141  --  139 139  K 3.8 3.5 3.2*  --  3.7 3.4*  CL 103 106 106  --  108 106  CO2 25 25 25   --  24 25  GLUCOSE 121* 109* 106*  --  79 96  BUN 10 9 7   --  5* 6  CREATININE 1.09* 0.98 0.93 0.89 0.81 1.05*  CALCIUM 9.5 9.5 9.4  --  8.7* 9.1  MG  --   --  2.5*  --   --  2.3   GFR: Estimated Creatinine Clearance: 103.3 mL/min (A) (by C-G formula based on SCr of 1.05 mg/dL (H)). Liver Function Tests: Recent Labs  Lab 05/22/18 1719 05/24/18 1325 05/26/18 1543  AST 22 28 21   ALT 16 20 19   ALKPHOS 104 108 99  BILITOT 0.5 0.5 0.4  PROT 8.4* 8.7* 7.9  ALBUMIN 4.0 4.2 4.1   Recent Labs  Lab 05/22/18 1719 05/24/18 1325 05/26/18 1543  LIPASE 26 31 35   No results for input(s): AMMONIA in the last 168 hours. Coagulation Profile: No results for input(s): INR, PROTIME in the last 168 hours. Cardiac Enzymes: Recent Labs  Lab 05/26/18 1543  TROPONINI <0.03   BNP (last 3 results) No results for input(s): PROBNP in the last 8760 hours. HbA1C: No results for input(s): HGBA1C in the last 72 hours. CBG: No results for input(s): GLUCAP in the last 168 hours. Lipid Profile: No results for input(s): CHOL, HDL, LDLCALC, TRIG, CHOLHDL, LDLDIRECT in the last 72 hours. Thyroid Function Tests: No results for input(s): TSH, T4TOTAL, FREET4, T3FREE, THYROIDAB in the last 72 hours. Anemia Panel: No results for input(s): VITAMINB12, FOLATE, FERRITIN, TIBC, IRON, RETICCTPCT in the last 72 hours. Sepsis Labs: No results for input(s): PROCALCITON, LATICACIDVEN in the last 168 hours.  Recent Results (from the past 240 hour(s))  SARS Coronavirus 2 (CEPHEID - Performed in [place] hospital lab), Hosp Order     Status: None   Collection Time: 05/26/18  7:38 PM  Result Value Ref Range  Status   SARS Coronavirus 2 NEGATIVE NEGATIVE Final    Comment: (NOTE) If result is NEGATIVE SARS-CoV-2 target nucleic acids are NOT DETECTED. The SARS-CoV-2 RNA is generally detectable in upper and lower  respiratory specimens during the acute phase of infection. The lowest  concentration of SARS-CoV-2 viral copies this assay can detect is 250  copies / mL. A negative result does not preclude SARS-CoV-2 infection  and should not be used as the sole basis for treatment or other  patient management decisions.  A negative result may occur with  improper specimen collection / handling, submission of specimen other  than nasopharyngeal swab, presence of viral mutation(s) within the  areas targeted by this assay, and inadequate number of viral copies  (<250 copies / mL). A negative result must  be combined with clinical  observations, patient history, and epidemiological information. If result is POSITIVE SARS-CoV-2 target nucleic acids are DETECTED. The SARS-CoV-2 RNA is generally detectable in upper and lower  respiratory specimens dur ing the acute phase of infection.  Positive  results are indicative of active infection with SARS-CoV-2.  Clinical  correlation with patient history and other diagnostic information is  necessary to determine patient infection status.  Positive results do  not rule out bacterial infection or co-infection with other viruses. If result is PRESUMPTIVE POSTIVE SARS-CoV-2 nucleic acids MAY BE PRESENT.   A presumptive positive result was obtained on the submitted specimen  and confirmed on repeat testing.  While 2019 novel coronavirus  (SARS-CoV-2) nucleic acids may be present in the submitted sample  additional confirmatory testing may be necessary for epidemiological  and / or clinical management purposes  to differentiate between  SARS-CoV-2 and other Sarbecovirus currently known to infect humans.  If clinically indicated additional testing with an alternate  test  methodology [phone]) is advised. The SARS-CoV-2 RNA is generally  detectable in upper and lower respiratory sp ecimens during the acute  phase of infection. The expected result is Negative. Fact Sheet for Patients:  StrictlyIdeas.no Fact Sheet for Healthcare Providers: BankingDealers.co.za This test is not yet approved or cleared by the [place] FDA and has been authorized for detection and/or diagnosis of SARS-CoV-2 by FDA under an Emergency Use Authorization (EUA).  This EUA will remain in effect (meaning this test can be used) for the duration of the COVID-19 declaration under Section 564(b)(1) of the Act, 21 U.S.C. section 360bbb-3(b)(1), unless the authorization is terminated or revoked sooner. Performed at [facility], [place] [address] [place], [place] [address]        Radiology Studies: No results found.    Scheduled Meds: . enoxaparin (LOVENOX) injection  60 mg Subcutaneous QHS  . ferrous sulfate  325 mg Oral Q breakfast  . mirtazapine  45 mg Oral QHS  . sucralfate  1 g Oral TID WC & HS  . traZODone  300 mg Oral QHS   Continuous Infusions: . lactated ringers 100 mL/hr at 05/28/18 1059     LOS: 1 day    Time spent: 25 minutes   Dessa Phi, DO [facility] [email or web address] 05/28/2018, 12:19 PM

## 2018-05-29 LAB — BASIC METABOLIC PANEL
Anion gap: 11 (ref 5–15)
BUN: 10 mg/dL (ref 6–20)
CO2: 25 mmol/L (ref 22–32)
Calcium: 9 mg/dL (ref 8.9–10.3)
Chloride: 104 mmol/L (ref 98–111)
Creatinine, Ser: 1.1 mg/dL — ABNORMAL HIGH (ref 0.44–1.00)
GFR calc Af Amer: >60 mL/min (ref >60)
GFR calc non Af Amer: >60 mL/min (ref >60)
Glucose, Bld: 117 mg/dL — ABNORMAL HIGH (ref 70–99)
Potassium: 3.1 mmol/L — ABNORMAL LOW (ref 3.5–5.1)
Sodium: 140 mmol/L (ref 135–145)

## 2018-05-29 LAB — MAGNESIUM: Magnesium: 2.2 mg/dL (ref 1.7–2.4)

## 2018-05-29 LAB — CBC
HCT: 36.7 % (ref 36.0–46.0)
Hemoglobin: 10.9 g/dL — ABNORMAL LOW (ref 12.0–15.0)
MCH: 22.7 pg — ABNORMAL LOW (ref 26.0–34.0)
MCHC: 29.7 g/dL — ABNORMAL LOW (ref 30.0–36.0)
MCV: 76.5 fL — ABNORMAL LOW (ref 80.0–100.0)
Platelets: 296 10*3/uL (ref 150–400)
RBC: 4.8 MIL/uL (ref 3.87–5.11)
RDW: 18.4 % — ABNORMAL HIGH (ref 11.5–15.5)
WBC: 11.3 10*3/uL — ABNORMAL HIGH (ref 4.0–10.5)
nRBC: 0 % (ref 0.0–0.2)

## 2018-05-29 MED ORDER — PROMETHAZINE HCL 25 MG RE SUPP: 25.0000 mg | Freq: Four times a day (QID) | RECTAL | 0 refills | Status: DC | PRN

## 2018-05-29 MED ORDER — ONDANSETRON 4 MG PO TBDP: 4.0000 mg | ORAL_TABLET | Freq: Three times a day (TID) | ORAL | 0 refills | Status: DC | PRN

## 2018-05-29 MED ORDER — LORAZEPAM 2 MG/ML IJ SOLN
0.5000 mg | Freq: Once | INTRAMUSCULAR | Status: AC
  Administered 2018-05-29: 0.5 mg via INTRAVENOUS
  Filled 2018-05-29: qty 1

## 2018-05-29 MED ORDER — ONDANSETRON HCL 4 MG/2ML IJ SOLN
4.0000 mg | Freq: Four times a day (QID) | INTRAMUSCULAR | Status: DC | PRN
  Administered 2018-05-29: 4 mg via INTRAVENOUS
  Filled 2018-05-29: qty 2

## 2018-05-29 MED ORDER — PROMETHAZINE HCL 25 MG/ML IJ SOLN: 25.0000 mg | Freq: Four times a day (QID) | INTRAMUSCULAR | Status: DC | PRN

## 2018-05-29 MED ORDER — POTASSIUM CHLORIDE CRYS ER 20 MEQ PO TBCR
40.0000 meq | EXTENDED_RELEASE_TABLET | ORAL | Status: AC
  Administered 2018-05-29 (×2): 40 meq via ORAL
  Filled 2018-05-29 (×2): qty 2

## 2018-05-29 NOTE — Discharge Summary (Signed)
Physician Discharge Summary  [name] QVZ:[id number] DOB: 04-Oct-1975 DOA: 05/26/2018  PCP: Clent Demark, PA-C  Admit date: 05/26/2018 Discharge date: 05/29/2018  Admitted From: Home Disposition:  Home  Recommendations for Outpatient Follow-up:  1. Follow up with PCP in 1 week 2. Stop marijuana use   Discharge Condition: Stable CODE STATUS: Full  Diet recommendation: Soft bland diet   Brief/Interim Summary: Haley Freeman is a 43 year old female past medical history significant for cyclical vomiting. Patient also carries diagnosis of anxiety, chronic anemia, depression and alcohol abuse. According to the patient, she has had intermittent nausea and vomiting since April of this year. Nausea and vomiting got worse within the last 24 hours. According to the patient, she has vomited up to 20 times. No associated fever or chills, no diarrhea. Potassium is noted to be 3.2. Leukocytosis of 15.4 is noted, urine is positive for ketones and the specific gravity is 1.021. No headache, no neck pain, no fever or chills, no chest pain, no shortness of breath and no urinary symptoms. Patient be admitted for further assessment and management.  Patient seen and examined by GI.  Overall patient's history is most consistent with cannabis hyperemesis syndrome. Hypokalemia replaced prior to discharge.   Discharge Diagnoses:  Principal Problem:   Cannabis hyperemesis syndrome concurrent with and due to cannabis abuse [facility]) Active Problems:   Intractable nausea and vomiting   Hypokalemia    Discharge Instructions  Discharge Instructions    Call MD for:  difficulty breathing, headache or visual disturbances   Complete by:  As directed    Call MD for:  extreme fatigue   Complete by:  As directed    Call MD for:  persistant dizziness or light-headedness   Complete by:  As directed    Call MD for:  persistant nausea and vomiting   Complete by:  As directed    Call MD for:   severe uncontrolled pain   Complete by:  As directed    Call MD for:  temperature >100.4   Complete by:  As directed    Discharge instructions   Complete by:  As directed    You were cared for by a hospitalist during your hospital stay. If you have any questions about your discharge medications or the care you received while you were in the hospital after you are discharged, you can call the unit and ask to speak with the hospitalist on call if the hospitalist that took care of you is not available. Once you are discharged, your primary care physician will handle any further medical issues. Please note that NO REFILLS for any discharge medications will be authorized once you are discharged, as it is imperative that you return to your primary care physician (or establish a relationship with a primary care physician if you do not have one) for your aftercare needs so that they can reassess your need for medications and monitor your lab values.   Increase activity slowly   Complete by:  As directed      Allergies as of 05/29/2018      Reactions   Penicillins Anaphylaxis   Swells throat. Has patient had a PCN reaction causing immediate rash, facial/tongue/throat swelling, SOB or lightheadedness with hypotension: Yes Has patient had a PCN reaction causing severe rash involving mucus membranes or skin necrosis: No Has patient had a PCN reaction that required hospitalization: Yes Has patient had a PCN reaction occurring within the last 10 years: No If all of  the above answers are "NO", then may proceed with Cephalosporin use.   Strawberry (diagnostic) Shortness Of Breath      Medication List    STOP taking these medications   metoCLOPramide 5 MG tablet Commonly known as:  REGLAN     TAKE these medications   diphenhydrAMINE 25 mg capsule Commonly known as:  BENADRYL Take 25 mg by mouth every 6 (six) hours as needed for allergies or sleep.   ferrous sulfate 325 (65 FE) MG tablet Take 1  tablet (325 mg total) by mouth daily with breakfast.   hydrOXYzine 25 MG tablet Commonly known as:  ATARAX/VISTARIL Take 50 mg by mouth at bedtime as needed for nausea or vomiting.   mirtazapine 45 MG tablet Commonly known as:  REMERON Take 45 mg by mouth at bedtime.   ondansetron 4 MG disintegrating tablet Commonly known as:  Zofran ODT Take 1 tablet (4 mg total) by mouth every 8 (eight) hours as needed for nausea or vomiting.   pantoprazole 40 MG tablet Commonly known as:  Protonix Take 1 tablet (40 mg total) by mouth daily.   promethazine 25 MG suppository Commonly known as:  PHENERGAN Place 1 suppository (25 mg total) rectally every 6 (six) hours as needed for nausea or vomiting.   sucralfate 1 g tablet Commonly known as:  Carafate Take 1 tablet (1 g total) by mouth 4 (four) times daily -  with meals and at bedtime.   traZODone 100 MG tablet Commonly known as:  DESYREL Take 300 mg by mouth at bedtime.   triamcinolone cream 0.5 % Commonly known as:  KENALOG Apply 1 application topically 3 (three) times daily as needed (ezcema).      Follow-up Information    Clent Demark, PA-C. Schedule an appointment as soon as possible for a visit in 1 week(s).   Specialty:  Physician Assistant Contact information: [place] [address] [phone]          Allergies  Allergen Reactions  . Penicillins Anaphylaxis    Swells throat. Has patient had a PCN reaction causing immediate rash, facial/tongue/throat swelling, SOB or lightheadedness with hypotension: Yes Has patient had a PCN reaction causing severe rash involving mucus membranes or skin necrosis: No Has patient had a PCN reaction that required hospitalization: Yes Has patient had a PCN reaction occurring within the last 10 years: No If all of the above answers are "NO", then may proceed with Cephalosporin use.   [name] (Diagnostic) Shortness Of Breath    Consultations:  GI     Procedures/Studies: None this admission      Discharge Exam: Vitals:   05/28/18 2020 05/29/18 0506  BP: (!) 126/94 135/80  Pulse: (!) 103 92  Resp: 20 20  Temp: 99.2 F (37.3 C) 98.9 F (37.2 C)  SpO2: 98% 94%    General: Pt is alert, awake, not in acute distress Cardiovascular: RRR, S1/S2 +, no rubs, no gallops Respiratory: CTA bilaterally, no wheezing, no rhonchi Abdominal: Soft, mildly TTP epigastric, ND, bowel sounds + Extremities: no edema, no cyanosis    The results of significant diagnostics from this hospitalization (including imaging, microbiology, ancillary and laboratory) are listed below for reference.     Microbiology: Recent Results (from the past 240 hour(s))  SARS Coronavirus 2 (CEPHEID - Performed in [place] hospital lab), Hosp Order     Status: None   Collection Time: 05/26/18  7:38 PM  Result Value Ref Range Status   SARS Coronavirus  2 NEGATIVE NEGATIVE Final    Comment: (NOTE) If result is NEGATIVE SARS-CoV-2 target nucleic acids are NOT DETECTED. The SARS-CoV-2 RNA is generally detectable in upper and lower  respiratory specimens during the acute phase of infection. The lowest  concentration of SARS-CoV-2 viral copies this assay can detect is 250  copies / mL. A negative result does not preclude SARS-CoV-2 infection  and should not be used as the sole basis for treatment or other  patient management decisions.  A negative result may occur with  improper specimen collection / handling, submission of specimen other  than nasopharyngeal swab, presence of viral mutation(s) within the  areas targeted by this assay, and inadequate number of viral copies  (<250 copies / mL). A negative result must be combined with clinical  observations, patient history, and epidemiological information. If result is POSITIVE SARS-CoV-2 target nucleic acids are DETECTED. The SARS-CoV-2 RNA is generally detectable in upper and lower  respiratory specimens  dur ing the acute phase of infection.  Positive  results are indicative of active infection with SARS-CoV-2.  Clinical  correlation with patient history and other diagnostic information is  necessary to determine patient infection status.  Positive results do  not rule out bacterial infection or co-infection with other viruses. If result is PRESUMPTIVE POSTIVE SARS-CoV-2 nucleic acids MAY BE PRESENT.   A presumptive positive result was obtained on the submitted specimen  and confirmed on repeat testing.  While 2019 novel coronavirus  (SARS-CoV-2) nucleic acids may be present in the submitted sample  additional confirmatory testing may be necessary for epidemiological  and / or clinical management purposes  to differentiate between  SARS-CoV-2 and other Sarbecovirus currently known to infect humans.  If clinically indicated additional testing with an alternate test  methodology [phone]) is advised. The SARS-CoV-2 RNA is generally  detectable in upper and lower respiratory sp ecimens during the acute  phase of infection. The expected result is Negative. Fact Sheet for Patients:  StrictlyIdeas.no Fact Sheet for Healthcare Providers: BankingDealers.co.za This test is not yet approved or cleared by the [place] FDA and has been authorized for detection and/or diagnosis of SARS-CoV-2 by FDA under an Emergency Use Authorization (EUA).  This EUA will remain in effect (meaning this test can be used) for the duration of the COVID-19 declaration under Section 564(b)(1) of the Act, 21 U.S.C. section 360bbb-3(b)(1), unless the authorization is terminated or revoked sooner. Performed at [facility], [place] [address], [place], [place] [address]      Labs: BNP (last 3 results) No results for input(s): BNP in the last 8760 hours. Basic Metabolic Panel: Recent Labs  Lab 05/24/18 1325 05/26/18 1543 05/26/18 2245  05/27/18 0524 05/28/18 0608 05/29/18 0808  NA 141 141  --  139 139 140  K 3.5 3.2*  --  3.7 3.4* 3.1*  CL 106 106  --  108 106 104  CO2 25 25  --  24 25 25   GLUCOSE 109* 106*  --  79 96 117*  BUN 9 7  --  5* 6 10  CREATININE 0.98 0.93 0.89 0.81 1.05* 1.10*  CALCIUM 9.5 9.4  --  8.7* 9.1 9.0  MG  --  2.5*  --   --  2.3 2.2   Liver Function Tests: Recent Labs  Lab 05/22/18 1719 05/24/18 1325 05/26/18 1543  AST 22 28 21   ALT 16 20 19   ALKPHOS 104 108 99  BILITOT 0.5 0.5 0.4  PROT 8.4* 8.7* 7.9  ALBUMIN 4.0 4.2 4.1   Recent Labs  Lab 05/22/18 1719 05/24/18 1325 05/26/18 1543  LIPASE 26 31 35   No results for input(s): AMMONIA in the last 168 hours. CBC: Recent Labs  Lab 05/22/18 1719 05/24/18 1325 05/26/18 1543 05/26/18 2245 05/27/18 0524 05/28/18 0608 05/29/18 0808  WBC 20.0* 15.6* 15.4* 18.6* 12.0* 14.4* 11.3*  NEUTROABS 16.5* 12.5* 11.8*  --   --   --   --   HGB 11.7* 11.8* 11.4* 11.2* 10.1* 11.1* 10.9*  HCT 39.0 40.6 37.9 38.8 34.1* 37.6 36.7  MCV 76.8* 76.6* 76.0* 77.3* 77.3* 76.1* 76.5*  PLT 420* 240 369 364 293 347 296   Cardiac Enzymes: Recent Labs  Lab 05/26/18 1543  TROPONINI <0.03   BNP: Invalid input(s): POCBNP CBG: No results for input(s): GLUCAP in the last 168 hours. D-Dimer No results for input(s): DDIMER in the last 72 hours. Hgb A1c No results for input(s): HGBA1C in the last 72 hours. Lipid Profile No results for input(s): CHOL, HDL, LDLCALC, TRIG, CHOLHDL, LDLDIRECT in the last 72 hours. Thyroid function studies No results for input(s): TSH, T4TOTAL, T3FREE, THYROIDAB in the last 72 hours.  Invalid input(s): FREET3 Anemia work up No results for input(s): VITAMINB12, FOLATE, FERRITIN, TIBC, IRON, RETICCTPCT in the last 72 hours. Urinalysis    Component Value Date/Time   COLORURINE YELLOW 05/26/2018 1544   APPEARANCEUR HAZY (A) 05/26/2018 1544   LABSPEC 1.021 05/26/2018 1544   PHURINE 6.0 05/26/2018 1544   GLUCOSEU  NEGATIVE 05/26/2018 1544   HGBUR NEGATIVE 05/26/2018 [place] 05/26/2018 1544   KETONESUR 20 (A) 05/26/2018 1544   PROTEINUR 30 (A) 05/26/2018 1544   UROBILINOGEN 0.2 09/22/2014 0730   NITRITE NEGATIVE 05/26/2018 1544   LEUKOCYTESUR TRACE (A) 05/26/2018 1544   Sepsis Labs Invalid input(s): PROCALCITONIN,  WBC,  LACTICIDVEN Microbiology Recent Results (from the past 240 hour(s))  SARS Coronavirus 2 (CEPHEID - Performed in [place] hospital lab), Hosp Order     Status: None   Collection Time: 05/26/18  7:38 PM  Result Value Ref Range Status   SARS Coronavirus 2 NEGATIVE NEGATIVE Final    Comment: (NOTE) If result is NEGATIVE SARS-CoV-2 target nucleic acids are NOT DETECTED. The SARS-CoV-2 RNA is generally detectable in upper and lower  respiratory specimens during the acute phase of infection. The lowest  concentration of SARS-CoV-2 viral copies this assay can detect is 250  copies / mL. A negative result does not preclude SARS-CoV-2 infection  and should not be used as the sole basis for treatment or other  patient management decisions.  A negative result may occur with  improper specimen collection / handling, submission of specimen other  than nasopharyngeal swab, presence of viral mutation(s) within the  areas targeted by this assay, and inadequate number of viral copies  (<250 copies / mL). A negative result must be combined with clinical  observations, patient history, and epidemiological information. If result is POSITIVE SARS-CoV-2 target nucleic acids are DETECTED. The SARS-CoV-2 RNA is generally detectable in upper and lower  respiratory specimens dur ing the acute phase of infection.  Positive  results are indicative of active infection with SARS-CoV-2.  Clinical  correlation with patient history and other diagnostic information is  necessary to determine patient infection status.  Positive results do  not rule out bacterial infection or  co-infection with other viruses. If result is PRESUMPTIVE POSTIVE SARS-CoV-2 nucleic acids MAY BE PRESENT.   A presumptive positive result was obtained  on the submitted specimen  and confirmed on repeat testing.  While 2019 novel coronavirus  (SARS-CoV-2) nucleic acids may be present in the submitted sample  additional confirmatory testing may be necessary for epidemiological  and / or clinical management purposes  to differentiate between  SARS-CoV-2 and other Sarbecovirus currently known to infect humans.  If clinically indicated additional testing with an alternate test  methodology [phone]) is advised. The SARS-CoV-2 RNA is generally  detectable in upper and lower respiratory sp ecimens during the acute  phase of infection. The expected result is Negative. Fact Sheet for Patients:  StrictlyIdeas.no Fact Sheet for Healthcare Providers: BankingDealers.co.za This test is not yet approved or cleared by the [place] FDA and has been authorized for detection and/or diagnosis of SARS-CoV-2 by FDA under an Emergency Use Authorization (EUA).  This EUA will remain in effect (meaning this test can be used) for the duration of the COVID-19 declaration under Section 564(b)(1) of the Act, 21 U.S.C. section 360bbb-3(b)(1), unless the authorization is terminated or revoked sooner. Performed at [facility], [place] [address], [place], [place] [address]      Patient was seen and examined on the day of discharge and was found to be in stable condition. Time coordinating discharge: 25 minutes including assessment and coordination of care, as well as examination of the patient.   SIGNED:  Dessa Phi, DO [facility] [email or web address] 05/29/2018, 1:10 PM

## 2018-06-02 ENCOUNTER — Telehealth: Payer: Self-pay

## 2018-06-02 NOTE — Telephone Encounter (Signed)
Tried to contact patient regarding COV-19 screening before appointment. There was no answer and vm was full

## 2018-06-03 ENCOUNTER — Encounter: Payer: Self-pay | Admitting: Family Medicine

## 2018-06-03 ENCOUNTER — Other Ambulatory Visit: Payer: Self-pay

## 2018-06-03 ENCOUNTER — Ambulatory Visit (INDEPENDENT_AMBULATORY_CARE_PROVIDER_SITE_OTHER): Payer: Medicare Other | Admitting: Family Medicine

## 2018-06-03 VITALS — BP 130/80 | HR 88 | Temp 98.4°F | Ht 68.0 in | Wt 310.8 lb

## 2018-06-03 DIAGNOSIS — R112 Nausea with vomiting, unspecified: Secondary | ICD-10-CM | POA: Diagnosis not present

## 2018-06-03 DIAGNOSIS — E876 Hypokalemia: Secondary | ICD-10-CM | POA: Diagnosis not present

## 2018-06-03 DIAGNOSIS — E559 Vitamin D deficiency, unspecified: Secondary | ICD-10-CM | POA: Diagnosis not present

## 2018-06-03 DIAGNOSIS — R8290 Unspecified abnormal findings in urine: Secondary | ICD-10-CM

## 2018-06-03 DIAGNOSIS — Z09 Encounter for follow-up examination after completed treatment for conditions other than malignant neoplasm: Secondary | ICD-10-CM | POA: Diagnosis not present

## 2018-06-03 DIAGNOSIS — K2100 Gastro-esophageal reflux disease with esophagitis, without bleeding: Secondary | ICD-10-CM

## 2018-06-03 DIAGNOSIS — E538 Deficiency of other specified B group vitamins: Secondary | ICD-10-CM

## 2018-06-03 DIAGNOSIS — Z9049 Acquired absence of other specified parts of digestive tract: Secondary | ICD-10-CM | POA: Diagnosis not present

## 2018-06-03 DIAGNOSIS — D649 Anemia, unspecified: Secondary | ICD-10-CM | POA: Diagnosis not present

## 2018-06-03 DIAGNOSIS — Z7689 Persons encountering health services in other specified circumstances: Secondary | ICD-10-CM

## 2018-06-03 DIAGNOSIS — Z0000 Encounter for general adult medical examination without abnormal findings: Secondary | ICD-10-CM | POA: Diagnosis not present

## 2018-06-03 LAB — POCT URINALYSIS DIP (MANUAL ENTRY)
Blood, UA: NEGATIVE
Glucose, UA: NEGATIVE mg/dL
Nitrite, UA: NEGATIVE
Protein Ur, POC: 30 mg/dL — AB
Spec Grav, UA: 1.02 (ref 1.010–1.025)
Urobilinogen, UA: 0.2 E.U./dL
pH, UA: 6 (ref 5.0–8.0)

## 2018-06-03 NOTE — Progress Notes (Signed)
Patient [facility]   New Patient--Hospital Follow Up--Establish Care  Subjective:  Patient ID: Haley Freeman, female    DOB: 12/22/1975  Age: 43 y.o. MRN: [id number]  CC:  Chief Complaint  Patient presents with  . Establish Care  . Hospitalization Follow-up  . A1C    5.8    HPI Haley Freeman is a 43 year old female who presents for Hospital Follow Up and to Establish Care today.   Past Medical History:  Diagnosis Date  . Alcohol abuse   . Anxiety   . Chronic anemia 05/04/2018  . Depression   . Headache(784.0)   . History of cholecystectomy 2019  . Hypokalemia   . Neuromuscular disorder [facility])    Current Status: Since her last Hospital Admission from 05/26/2018-05/29/2018 for Cannabis Hyperemesis Syndrome, she is doing well with no complaints. She last had Cannabis on today (06/03/2018). She states that she smokes Marijuana every day. She denies other illegal drug use. She states that she does not drink alcohol. She has never been screened for breast cancer. She has c/o occasional back pain. She does not take OTC pain medications because of her abdominal problems. No reports of GI problems such as diarrhea, and constipation.She states that she wants to eventually get a a healthier weight. Her anxiety is stable today. She denies suicidal ideations, homicidal ideations, or auditory hallucinations.   She denies fevers, chills, fatigue, recent infections, weight loss, and night sweats. She has not had any headaches, visual changes, dizziness, and falls. No chest pain, heart palpitations, cough and shortness of breath reported.  She has no reports of blood in stools, dysuria and hematuria.   Past Surgical History:  Procedure Laterality Date  . CESAREAN SECTION  '99 and '08   x 2  . CHOLECYSTECTOMY N/A 10/01/2017   Procedure: LAPAROSCOPIC CHOLECYSTECTOMY WITH POSSIBLE  INTRAOPERATIVE CHOLANGIOGRAM;  Surgeon: Stark Klein, MD;   Location: [facility];  Service: General;  Laterality: N/A;  . ESOPHAGOGASTRODUODENOSCOPY N/A 09/16/2017   Procedure: ESOPHAGOGASTRODUODENOSCOPY (EGD) WITH PROPOFOL;  Surgeon: Clarene Essex, MD;  Location: [place];  Service: Endoscopy;  Laterality: N/A;  . LAPAROSCOPY Right 08/22/2017   Procedure: LAPAROSCOPY OPERATIVE removal of right ovary and right dermoid cyst;  Surgeon: Sloan Leiter, MD;  Location: [facility];  Service: Gynecology;  Laterality: Right;    Family History  Problem Relation Age of Onset  . Hypertension Mother     Social History   Socioeconomic History  . Marital status: Divorced    Spouse name: Not on file  . Number of children: Not on file  . Years of education: Not on file  . Highest education level: Not on file  Occupational History  . Not on file  Social Needs  . Financial resource strain: Not on file  . Food insecurity:    Worry: Not on file    Inability: Not on file  . Transportation needs:    Medical: Not on file    Non-medical: Not on file  Tobacco Use  . Smoking status: Former Smoker    Packs/day: 0.50  . Smokeless tobacco: Never Used  Substance and Sexual Activity  . Alcohol use: Not Currently  . Drug use: Yes    Types: Other-see comments, Marijuana    Comment: Occasionally smokes marijuana   . Sexual activity: Not Currently  Lifestyle  . Physical activity:    Days per week: Not on file    Minutes per session: Not  on file  . Stress: Not on file  Relationships  . Social connections:    Talks on phone: Not on file    Gets together: Not on file    Attends religious service: Not on file    Active member of club or organization: Not on file    Attends meetings of clubs or organizations: Not on file    Relationship status: Not on file  . Intimate partner violence:    Fear of current or ex partner: Not on file    Emotionally abused: Not on file    Physically abused: Not on file    Forced sexual activity: Not on file  Other Topics Concern  .  Not on file  Social History Narrative  . Not on file    Outpatient Medications Prior to Visit  Medication Sig Dispense Refill  . diphenhydrAMINE (BENADRYL) 25 mg capsule Take 25 mg by mouth every 6 (six) hours as needed for allergies or sleep.    . ferrous sulfate 325 (65 FE) MG tablet Take 1 tablet (325 mg total) by mouth daily with breakfast. 30 tablet 0  . hydrOXYzine (ATARAX/VISTARIL) 25 MG tablet Take 50 mg by mouth at bedtime as needed for nausea or vomiting.   2  . mirtazapine (REMERON) 45 MG tablet Take 45 mg by mouth at bedtime.   2  . ondansetron (ZOFRAN ODT) 4 MG disintegrating tablet Take 1 tablet (4 mg total) by mouth every 8 (eight) hours as needed for nausea or vomiting. 20 tablet 0  . pantoprazole (PROTONIX) 40 MG tablet Take 1 tablet (40 mg total) by mouth daily. 30 tablet 1  . promethazine (PHENERGAN) 25 MG suppository Place 1 suppository (25 mg total) rectally every 6 (six) hours as needed for nausea or vomiting. 24 each 0  . sucralfate (CARAFATE) 1 g tablet Take 1 tablet (1 g total) by mouth 4 (four) times daily -  with meals and at bedtime. 28 tablet 0  . traZODone (DESYREL) 100 MG tablet Take 300 mg by mouth at bedtime.     . triamcinolone cream (KENALOG) 0.5 % Apply 1 application topically 3 (three) times daily as needed (ezcema).     No facility-administered medications prior to visit.     Allergies  Allergen Reactions  . Penicillins Anaphylaxis    Swells throat. Has patient had a PCN reaction causing immediate rash, facial/tongue/throat swelling, SOB or lightheadedness with hypotension: Yes Has patient had a PCN reaction causing severe rash involving mucus membranes or skin necrosis: No Has patient had a PCN reaction that required hospitalization: Yes Has patient had a PCN reaction occurring within the last 10 years: No If all of the above answers are "NO", then may proceed with Cephalosporin use.   . [place] (Diagnostic) Shortness Of Breath    ROS Review  of Systems  Constitutional: Negative.   HENT: Negative.   Eyes: Negative.   Respiratory: Negative.   Cardiovascular: Negative.   Gastrointestinal: Positive for abdominal distention (Obese). Abdominal pain: generalized.   Endocrine: Negative.   Genitourinary: Negative.   Musculoskeletal: Negative.   Skin: Negative.   Allergic/Immunologic: Negative.   Neurological: Negative.   Hematological: Negative.   Psychiatric/Behavioral: Negative.       Objective:    Physical Exam  Constitutional: She is oriented to person, place, and time. She appears well-developed and well-nourished.  HENT:  Head: Normocephalic and atraumatic.  Eyes: Conjunctivae are normal.  Neck: Normal range of motion. Neck supple.  Cardiovascular: Normal rate, regular  rhythm, normal heart sounds and intact distal pulses.  Pulmonary/Chest: Effort normal and breath sounds normal.  Abdominal: Soft. Bowel sounds are normal.  Musculoskeletal: Normal range of motion.  Neurological: She is alert and oriented to person, place, and time. She has normal reflexes.  Skin: Skin is warm and dry.  Psychiatric: She has a normal mood and affect. Her behavior is normal. Judgment and thought content normal.  Nursing note and vitals reviewed.   BP 130/80 (BP Location: Left Arm, Patient Position: Sitting, Cuff Size: Large)   Pulse 88   Temp 98.4 F (36.9 C) (Oral)   Ht 5\' 8"  (1.727 m)   Wt (!) 310 lb 12.8 oz (141 kg)   LMP 05/06/2018   SpO2 100%   BMI 47.26 kg/m  Wt Readings from Last 3 Encounters:  06/03/18 (!) 310 lb 12.8 oz (141 kg)  05/26/18 (!) 310 lb 14.4 oz (141 kg)  05/22/18 281 lb 15.5 oz (127.9 kg)     Health Maintenance Due  Topic Date Due  . PAP SMEAR-Modifier  01/25/1996    There are no preventive care reminders to display for this patient.  Lab Results  Component Value Date   TSH 0.895 06/03/2018   Lab Results  Component Value Date   WBC 11.3 (H) 05/29/2018   HGB 10.9 (L) 05/29/2018   HCT 36.7  05/29/2018   MCV 76.5 (L) 05/29/2018   PLT 296 05/29/2018   Lab Results  Component Value Date   NA 142 06/03/2018   K 3.9 06/03/2018   CO2 22 06/03/2018   GLUCOSE 85 06/03/2018   BUN 10 06/03/2018   CREATININE 0.94 06/03/2018   BILITOT <0.2 06/03/2018   ALKPHOS 110 06/03/2018   AST 18 06/03/2018   ALT 15 06/03/2018   PROT 7.0 06/03/2018   ALBUMIN 4.3 06/03/2018   CALCIUM 9.8 06/03/2018   ANIONGAP 11 05/29/2018   Lab Results  Component Value Date   CHOL 183 06/03/2018   Lab Results  Component Value Date   HDL 42 06/03/2018   Lab Results  Component Value Date   LDLCALC 103 (H) 06/03/2018   Lab Results  Component Value Date   TRIG 188 (H) 06/03/2018   Lab Results  Component Value Date   CHOLHDL 4.4 06/03/2018   Lab Results  Component Value Date   HGBA1C 6.3 (H) 10/29/2017      Assessment & Plan:   1. Encounter to establish care - POCT urinalysis dipstick  2. Nausea and vomiting, intractability of vomiting not specified, unspecified vomiting type Improved since hospital discharge.   3. Hypokalemia Stable today.   4. Chronic anemia  5. Vitamin D deficiency - Vitamin D, 25-hydroxy  6. Vitamin B 12 deficiency - Vitamin B12  7. Abnormal urinalysis Results are pending.  - Urine Culture  8. Gastroesophageal reflux disease with esophagitis - Ambulatory referral to Gastroenterology  9. Healthcare maintenance - Comprehensive metabolic panel - TSH - Lipid Panel  10. History of cholecystectomy Recent Cholecystectomy 2019.  11. Follow up He will follow up in 3 months.   No orders of the defined types were placed in this encounter.   Orders Placed This Encounter  Procedures  . Urine Culture  . Comprehensive metabolic panel  . TSH  . Lipid Panel  . Vitamin B12  . Vitamin D, 25-hydroxy  . Ambulatory referral to Gastroenterology  . POCT urinalysis dipstick     Referral Orders     Ambulatory referral to Gastroenterology   Kathe Becton,  MSN, FNP-BC [facility] [place], [place] [phone]   Problem List Items Addressed This Visit      Other   Chronic anemia   Hypokalemia    Other Visit Diagnoses    Encounter to establish care    -  Primary   Relevant Orders   POCT urinalysis dipstick (Completed)   Healthcare maintenance       Relevant Orders   Comprehensive metabolic panel (Completed)   TSH (Completed)   Lipid Panel (Completed)   Vitamin D deficiency       Relevant Orders   Vitamin D, 25-hydroxy (Completed)   Vitamin B 12 deficiency       Relevant Orders   Vitamin B12 (Completed)   Abnormal urinalysis       Relevant Orders   Urine Culture   Gastroesophageal reflux disease with esophagitis       Relevant Orders   Ambulatory referral to Gastroenterology   Nausea and vomiting, intractability of vomiting not specified, unspecified vomiting type       History of cholecystectomy       Follow up          No orders of the defined types were placed in this encounter.   Follow-up: Return in about 3 months (around 09/03/2018).    Azzie Glatter, FNP

## 2018-06-04 DIAGNOSIS — K2100 Gastro-esophageal reflux disease with esophagitis, without bleeding: Secondary | ICD-10-CM | POA: Insufficient documentation

## 2018-06-04 LAB — COMPREHENSIVE METABOLIC PANEL
ALT: 15 IU/L (ref 0–32)
AST: 18 IU/L (ref 0–40)
Albumin/Globulin Ratio: 1.6 (ref 1.2–2.2)
Albumin: 4.3 g/dL (ref 3.8–4.8)
Alkaline Phosphatase: 110 IU/L (ref 39–117)
BUN/Creatinine Ratio: 11 (ref 9–23)
BUN: 10 mg/dL (ref 6–24)
Bilirubin Total: <0.2 mg/dL (ref 0.0–1.2)
CO2: 22 mmol/L (ref 20–29)
Calcium: 9.8 mg/dL (ref 8.7–10.2)
Chloride: 102 mmol/L (ref 96–106)
Creatinine, Ser: 0.94 mg/dL (ref 0.57–1.00)
GFR calc Af Amer: 86 mL/min/{1.73_m2} (ref >59)
GFR calc non Af Amer: 75 mL/min/{1.73_m2} (ref >59)
Globulin, Total: 2.7 g/dL (ref 1.5–4.5)
Glucose: 85 mg/dL (ref 65–99)
Potassium: 3.9 mmol/L (ref 3.5–5.2)
Sodium: 142 mmol/L (ref 134–144)
Total Protein: 7 g/dL (ref 6.0–8.5)

## 2018-06-04 LAB — LIPID PANEL
Chol/HDL Ratio: 4.4 ratio (ref 0.0–4.4)
Cholesterol, Total: 183 mg/dL (ref 100–199)
HDL: 42 mg/dL (ref >39)
LDL Calculated: 103 mg/dL — ABNORMAL HIGH (ref 0–99)
Triglycerides: 188 mg/dL — ABNORMAL HIGH (ref 0–149)
VLDL Cholesterol Cal: 38 mg/dL (ref 5–40)

## 2018-06-04 LAB — TSH: TSH: 0.895 u[IU]/mL (ref 0.450–4.500)

## 2018-06-04 LAB — VITAMIN B12: Vitamin B-12: 504 pg/mL (ref 232–1245)

## 2018-06-04 LAB — VITAMIN D 25 HYDROXY (VIT D DEFICIENCY, FRACTURES): Vit D, 25-Hydroxy: 21.3 ng/mL — ABNORMAL LOW (ref 30.0–100.0)

## 2018-06-05 LAB — URINE CULTURE

## 2018-06-22 ENCOUNTER — Telehealth: Payer: Self-pay | Admitting: Internal Medicine

## 2018-06-22 NOTE — Telephone Encounter (Signed)
DOD 06-22-18 AM Dr. Hilarie Fredrickson  We have received a referral for the patient to be seen in our office. Records have been sent over to our office for Review. Records will be sent to Doctor of the day. Please advise for scheduling.

## 2018-06-29 NOTE — Telephone Encounter (Signed)
Dr Hilarie Fredrickson indicates that he is unable to take patient into his practice. Records given back to front office staff.

## 2018-07-08 NOTE — Telephone Encounter (Signed)
Spoke with patient and communicated Dr. Vena Rua answer.

## 2018-08-23 ENCOUNTER — Other Ambulatory Visit: Payer: Self-pay

## 2018-08-23 ENCOUNTER — Encounter (HOSPITAL_COMMUNITY): Payer: Self-pay | Admitting: Emergency Medicine

## 2018-08-23 ENCOUNTER — Ambulatory Visit (HOSPITAL_COMMUNITY)
Admission: EM | Admit: 2018-08-23 | Discharge: 2018-08-23 | Disposition: A | Discharge status: Home / Self Care | Payer: Medicare Other

## 2018-08-23 DIAGNOSIS — F1210 Cannabis abuse, uncomplicated: Secondary | ICD-10-CM

## 2018-08-23 DIAGNOSIS — R140 Abdominal distension (gaseous): Secondary | ICD-10-CM | POA: Diagnosis not present

## 2018-08-23 DIAGNOSIS — F1220 Cannabis dependence, uncomplicated: Secondary | ICD-10-CM

## 2018-08-23 DIAGNOSIS — R141 Gas pain: Secondary | ICD-10-CM

## 2018-08-23 MED ORDER — METAMUCIL 0.52 G PO CAPS: 0.5200 g | ORAL_CAPSULE | Freq: Every day | ORAL | 0 refills | Status: DC

## 2018-08-23 NOTE — ED Triage Notes (Signed)
Reports gastroparesis and has had issues for 2-3 weeks.  Has used imodium ad and gas-x to treat symptoms.  Minimal relief with imodium ad.  Reports bloating and gas, in extreme moments, will vomit

## 2018-08-23 NOTE — ED Provider Notes (Signed)
MRN: [id number] DOB: 01-09-1975  Subjective:   Haley Freeman is a 43 y.o. female presenting for 2-3 week history of gas, gastroparesis. Patient has been using Imodium and Gas X with minimal relief. Still has abdominal bloating. Denies opioid use. Still smokes marijuana, daily. States that this helps with her nausea but is not her main issue. Her primary concern today is getting help with her gas. If she presses on belly, she can start belching. But has random vomiting.  He contacted her GI specialist and was advised that there is no more that they can do after extensive work-up including CT scans and procedures.  She was supposed to go to a tertiary care center but patient refused stating that they wanted her to have experimental treatment.  She tries to practice a healthy diet.  Tries to minimize foods that can cause inflammation but this does not help.   Patient is currently taking mirtazapine, hydroxyzine, trazodone.    Allergies  Allergen Reactions  . Penicillins Anaphylaxis    Swells throat. Has patient had a PCN reaction causing immediate rash, facial/tongue/throat swelling, SOB or lightheadedness with hypotension: Yes Has patient had a PCN reaction causing severe rash involving mucus membranes or skin necrosis: No Has patient had a PCN reaction that required hospitalization: Yes Has patient had a PCN reaction occurring within the last 10 years: No If all of the above answers are "NO", then may proceed with Cephalosporin use.   [name] (Diagnostic) Shortness Of Breath    Past Medical History:  Diagnosis Date  . Alcohol abuse   . Anxiety   . Chronic anemia 05/04/2018  . Depression   . Headache(784.0)   . History of cholecystectomy 2019  . Hypokalemia   . Neuromuscular disorder [facility])      Past Surgical History:  Procedure Laterality Date  . CESAREAN SECTION  '99 and '08   x 2  . CHOLECYSTECTOMY N/A 10/01/2017   Procedure: LAPAROSCOPIC CHOLECYSTECTOMY WITH POSSIBLE   INTRAOPERATIVE CHOLANGIOGRAM;  Surgeon: Stark Klein, MD;  Location: [facility];  Service: General;  Laterality: N/A;  . ESOPHAGOGASTRODUODENOSCOPY N/A 09/16/2017   Procedure: ESOPHAGOGASTRODUODENOSCOPY (EGD) WITH PROPOFOL;  Surgeon: Clarene Essex, MD;  Location: [place];  Service: Endoscopy;  Laterality: N/A;  . LAPAROSCOPY Right 08/22/2017   Procedure: LAPAROSCOPY OPERATIVE removal of right ovary and right dermoid cyst;  Surgeon: Sloan Leiter, MD;  Location: [facility];  Service: Gynecology;  Laterality: Right;    ROS  Objective:   Vitals: BP 105/62 (BP Location: Left Arm) Comment (BP Location): large  Pulse (!) 105   Temp 98.3 F (36.8 C) (Oral)   Resp 18   LMP 08/18/2018   SpO2 98%   Physical Exam Constitutional:      General: She is not in acute distress.    Appearance: Normal appearance. She is well-developed. She is obese. She is not ill-appearing.  HENT:     Head: Normocephalic and atraumatic.     Nose: Nose normal.     Mouth/Throat:     Mouth: Mucous membranes are moist.     Pharynx: Oropharynx is clear.  Eyes:     General: No scleral icterus.    Extraocular Movements: Extraocular movements intact.     Pupils: Pupils are equal, round, and reactive to light.  Cardiovascular:     Rate and Rhythm: Normal rate.  Pulmonary:     Effort: Pulmonary effort is normal.  Skin:    General: Skin is warm and dry.  Neurological:  General: No focal deficit present.     Mental Status: She is alert and oriented to person, place, and time.  Psychiatric:        Mood and Affect: Mood is not anxious or depressed. Affect is not labile, flat or tearful.        Speech: She is communicative. Speech is not rapid and pressured, delayed, slurred or tangential.        Behavior: Behavior is not agitated, slowed, aggressive, withdrawn, hyperactive or combative.        Thought Content: Thought content does not include homicidal or suicidal ideation.        Cognition and Memory: Cognition and  memory normal.        Judgment: Judgment is not impulsive or inappropriate.     Assessment and Plan :   1. Abdominal bloating   2. Marijuana dependence ([place])   3. Marijuana abuse   4. Gas pain     Case discussed with Dr. Meda Coffee.  We counseled patient on avoiding use of Imodium and will have her start Metamucil as she is not willing to consider smoking marijuana cessation.  Provided her with information to during her center for her marijuana dependence/abuse.  Recommended patient follow-up with her GI doctor or consider is a referral to the tertiary care center has a recommended. Counseled patient on potential for adverse effects with medications prescribed/recommended today, ER and return-to-clinic precautions discussed, patient verbalized understanding.    Jaynee Eagles, PA-C 08/23/18 1131

## 2018-08-27 ENCOUNTER — Emergency Department (HOSPITAL_COMMUNITY)
Admission: EM | Admit: 2018-08-27 | Discharge: 2018-08-27 | Disposition: A | Discharge status: Home / Self Care | Payer: Medicare Other | Attending: Emergency Medicine | Admitting: Emergency Medicine

## 2018-08-27 ENCOUNTER — Other Ambulatory Visit: Payer: Self-pay

## 2018-08-27 ENCOUNTER — Emergency Department (HOSPITAL_COMMUNITY): Payer: Medicare Other

## 2018-08-27 DIAGNOSIS — Z87891 Personal history of nicotine dependence: Secondary | ICD-10-CM | POA: Diagnosis not present

## 2018-08-27 DIAGNOSIS — R112 Nausea with vomiting, unspecified: Secondary | ICD-10-CM | POA: Insufficient documentation

## 2018-08-27 DIAGNOSIS — R1013 Epigastric pain: Secondary | ICD-10-CM | POA: Diagnosis present

## 2018-08-27 DIAGNOSIS — Z79899 Other long term (current) drug therapy: Secondary | ICD-10-CM | POA: Diagnosis not present

## 2018-08-27 DIAGNOSIS — R109 Unspecified abdominal pain: Secondary | ICD-10-CM | POA: Diagnosis not present

## 2018-08-27 LAB — COMPREHENSIVE METABOLIC PANEL
ALT: 13 U/L (ref 0–44)
AST: 22 U/L (ref 15–41)
Albumin: 3.8 g/dL (ref 3.5–5.0)
Alkaline Phosphatase: 95 U/L (ref 38–126)
Anion gap: 13 (ref 5–15)
BUN: 10 mg/dL (ref 6–20)
CO2: 20 mmol/L — ABNORMAL LOW (ref 22–32)
Calcium: 9.2 mg/dL (ref 8.9–10.3)
Chloride: 105 mmol/L (ref 98–111)
Creatinine, Ser: 0.99 mg/dL (ref 0.44–1.00)
GFR calc Af Amer: >60 mL/min (ref >60)
GFR calc non Af Amer: >60 mL/min (ref >60)
Glucose, Bld: 114 mg/dL — ABNORMAL HIGH (ref 70–99)
Potassium: 3.4 mmol/L — ABNORMAL LOW (ref 3.5–5.1)
Sodium: 138 mmol/L (ref 135–145)
Total Bilirubin: 0.3 mg/dL (ref 0.3–1.2)
Total Protein: 7.7 g/dL (ref 6.5–8.1)

## 2018-08-27 LAB — URINALYSIS, ROUTINE W REFLEX MICROSCOPIC
Bilirubin Urine: NEGATIVE
Glucose, UA: NEGATIVE mg/dL
Hgb urine dipstick: NEGATIVE
Ketones, ur: 20 mg/dL — AB
Nitrite: NEGATIVE
Protein, ur: 100 mg/dL — AB
Specific Gravity, Urine: 1.028 (ref 1.005–1.030)
pH: 6 (ref 5.0–8.0)

## 2018-08-27 LAB — CBC
HCT: 37.4 % (ref 36.0–46.0)
Hemoglobin: 11 g/dL — ABNORMAL LOW (ref 12.0–15.0)
MCH: 22 pg — ABNORMAL LOW (ref 26.0–34.0)
MCHC: 29.4 g/dL — ABNORMAL LOW (ref 30.0–36.0)
MCV: 74.7 fL — ABNORMAL LOW (ref 80.0–100.0)
Platelets: 225 10*3/uL (ref 150–400)
RBC: 5.01 MIL/uL (ref 3.87–5.11)
RDW: 19.9 % — ABNORMAL HIGH (ref 11.5–15.5)
WBC: 17.9 10*3/uL — ABNORMAL HIGH (ref 4.0–10.5)
nRBC: 0 % (ref 0.0–0.2)

## 2018-08-27 LAB — POC URINE PREG, ED: Preg Test, Ur: NEGATIVE

## 2018-08-27 LAB — LIPASE, BLOOD: Lipase: 23 U/L (ref 11–51)

## 2018-08-27 MED ORDER — HALOPERIDOL LACTATE 5 MG/ML IJ SOLN
5.0000 mg | Freq: Once | INTRAMUSCULAR | Status: AC
  Administered 2018-08-27: 5 mg via INTRAVENOUS
  Filled 2018-08-27: qty 1

## 2018-08-27 MED ORDER — METOCLOPRAMIDE HCL 5 MG/ML IJ SOLN
10.0000 mg | Freq: Once | INTRAMUSCULAR | Status: AC
  Administered 2018-08-27: 10 mg via INTRAVENOUS
  Filled 2018-08-27: qty 2

## 2018-08-27 MED ORDER — HALOPERIDOL 2 MG PO TABS: 2.0000 mg | ORAL_TABLET | Freq: Two times a day (BID) | ORAL | 0 refills | Status: DC | PRN

## 2018-08-27 MED ORDER — SODIUM CHLORIDE 0.9 % IV BOLUS
1000.0000 mL | Freq: Once | INTRAVENOUS | Status: AC
  Administered 2018-08-27: 1000 mL via INTRAVENOUS

## 2018-08-27 MED ORDER — METOCLOPRAMIDE HCL 5 MG/ML IJ SOLN: 10.0000 mg | Freq: Once | INTRAMUSCULAR | Status: DC

## 2018-08-27 MED ORDER — ONDANSETRON HCL 4 MG/2ML IJ SOLN
4.0000 mg | Freq: Once | INTRAMUSCULAR | Status: AC | PRN
  Administered 2018-08-27: 4 mg via INTRAVENOUS
  Filled 2018-08-27: qty 2

## 2018-08-27 MED ORDER — SODIUM CHLORIDE 0.9% FLUSH: 3.0000 mL | Freq: Once | INTRAVENOUS | Status: DC

## 2018-08-27 NOTE — ED Provider Notes (Signed)
[facility] DEPT Provider Note   CSN: [id number] Arrival date & time: 08/27/18  9485     History   Chief Complaint Chief Complaint  Patient presents with  . Abdominal Pain    HPI Haley Freeman is a 43 y.o. female.     HPI Patient presents with abdominal pain gas bloating and vomiting.  Has had for around 3 weeks now.  Although she has had abdominal issues for much longer than that.  States that she had been told it was marijuana causing it.  She states then she started to smoke marijuana every day and got better.  Now is been having nausea vomiting and severe gas.  States gas in her epigastric area.  No diarrhea.  Pain is dull.  States the gas feels better after she belches.  No fevers.  Reviewing records she has had delayed gastric emptying studies also. Past Medical History:  Diagnosis Date  . Alcohol abuse   . Anxiety   . Chronic anemia 05/04/2018  . Depression   . Headache(784.0)   . History of cholecystectomy 2019  . Hypokalemia   . Neuromuscular disorder [facility])     Patient Active Problem List   Diagnosis Date Noted  . Gastroesophageal reflux disease with esophagitis 06/04/2018  . Chronic anemia 05/04/2018  . Hypokalemia 09/27/2017  . Elevated LFTs 09/27/2017  . Abnormal biliary HIDA scan 09/27/2017  . Marijuana abuse 09/27/2017  . Intractable nausea and vomiting 09/26/2017  . Cannabis hyperemesis syndrome concurrent with and due to cannabis abuse ([place]) 09/16/2017  . Dermoid 08/22/2017    Past Surgical History:  Procedure Laterality Date  . CESAREAN SECTION  '99 and '08   x 2  . CHOLECYSTECTOMY N/A 10/01/2017   Procedure: LAPAROSCOPIC CHOLECYSTECTOMY WITH POSSIBLE  INTRAOPERATIVE CHOLANGIOGRAM;  Surgeon: Stark Klein, MD;  Location: [facility];  Service: General;  Laterality: N/A;  . ESOPHAGOGASTRODUODENOSCOPY N/A 09/16/2017   Procedure: ESOPHAGOGASTRODUODENOSCOPY (EGD) WITH PROPOFOL;  Surgeon: Clarene Essex, MD;  Location: [facility];  Service: Endoscopy;  Laterality: N/A;  . LAPAROSCOPY Right 08/22/2017   Procedure: LAPAROSCOPY OPERATIVE removal of right ovary and right dermoid cyst;  Surgeon: Sloan Leiter, MD;  Location: [facility];  Service: Gynecology;  Laterality: Right;     OB History    Gravida  3   Para  3   Term      Preterm      AB      Living  3     SAB      TAB      Ectopic      Multiple      Live Births               Home Medications    Prior to Admission medications   Medication Sig Start Date End Date Taking? Authorizing Provider  diphenhydrAMINE (BENADRYL) 25 mg capsule Take 25 mg by mouth every 6 (six) hours as needed for allergies or sleep.    [provider]  haloperidol (HALDOL) 2 MG tablet Take 1 tablet (2 mg total) by mouth every 12 (twelve) hours as needed (vomiting). 08/27/18   Davonna Belling, MD  Loperamide HCl (IMODIUM A-D PO) Take by mouth.    [provider]  metoCLOPramide (REGLAN) 5 MG tablet Take 5 mg by mouth 3 (three) times daily. 06/09/18   [provider]  mirtazapine (REMERON) 45 MG tablet Take 45 mg by mouth at bedtime.  10/16/17   [provider]  promethazine (PHENERGAN) 25 MG suppository Place 1 suppository (25 mg total) rectally every 6 (six) hours as needed for nausea or vomiting. 05/29/18   Dessa Phi, DO  psyllium (METAMUCIL) 0.52 g capsule Take 1 capsule (0.52 g total) by mouth daily. 08/23/18   Jaynee Eagles, PA-C  Simethicone (GAS-X PO) Take by mouth.    [provider]  traZODone (DESYREL) 100 MG tablet Take 300 mg by mouth at bedtime.     [provider]  ferrous sulfate 325 (65 FE) MG tablet Take 1 tablet (325 mg total) by mouth daily with breakfast. 05/05/18 08/23/18  Shelly Coss, MD  pantoprazole (PROTONIX) 40 MG tablet Take 1 tablet (40 mg total) by mouth daily. 04/24/18 08/23/18  Sherwood Gambler, MD  sucralfate (CARAFATE) 1 g tablet Take 1 tablet (1 g total) by mouth 4 (four) times  daily -  with meals and at bedtime. 04/26/18 08/23/18  Julianne Rice, MD    Family History Family History  Problem Relation Age of Onset  . Hypertension Mother     Social History Social History   Tobacco Use  . Smoking status: Former Smoker    Packs/day: 0.50  . Smokeless tobacco: Never Used  Substance Use Topics  . Alcohol use: Not Currently  . Drug use: Yes    Types: Other-see comments, Marijuana    Comment: Occasionally smokes marijuana      Allergies   Penicillins and Strawberry (diagnostic)   Review of Systems Review of Systems  Constitutional: Negative for appetite change.  HENT: Negative for congestion.   Respiratory: Negative for shortness of breath.   Gastrointestinal: Positive for abdominal pain, nausea and vomiting.  Genitourinary: Negative for flank pain.  Musculoskeletal: Negative for back pain.  Skin: Negative for rash.  Neurological: Negative for weakness.  Psychiatric/Behavioral: Negative for confusion.     Physical Exam Updated Vital Signs BP 135/89   Pulse 89   Temp 98.3 F (36.8 C) (Oral)   Resp 16   Ht 5\' 8"  (1.727 m)   Wt 132 kg   LMP 08/18/2018   SpO2 99%   BMI 44.25 kg/m   Physical Exam Vitals signs and nursing note reviewed.  Constitutional:      Comments: Patient standing in the room walking around.  She is wearing her down which is only loosely on her.  Carrying emesis bag with some emesis in it.  Cardiovascular:     Rate and Rhythm: Normal rate and regular rhythm.  Pulmonary:     Breath sounds: Normal breath sounds.  Abdominal:     Comments: Mild upper abdominal tenderness without rebound or guarding.  Skin:    General: Skin is warm.  Neurological:     General: No focal deficit present.  Psychiatric:        Mood and Affect: Mood is anxious.      ED Treatments / Results  Labs (all labs ordered are listed, but only abnormal results are displayed) Labs Reviewed  COMPREHENSIVE METABOLIC PANEL - Abnormal; Notable  for the following components:      Result Value   Potassium 3.4 (*)    CO2 20 (*)    Glucose, Bld 114 (*)    All other components within normal limits  CBC - Abnormal; Notable for the following components:   WBC 17.9 (*)    Hemoglobin 11.0 (*)    MCV 74.7 (*)    MCH 22.0 (*)    MCHC 29.4 (*)    RDW 19.9 (*)  All other components within normal limits  URINALYSIS, ROUTINE W REFLEX MICROSCOPIC - Abnormal; Notable for the following components:   APPearance TURBID (*)    Ketones, ur 20 (*)    Protein, ur 100 (*)    Leukocytes,Ua TRACE (*)    Bacteria, UA RARE (*)    All other components within normal limits  LIPASE, BLOOD  POC URINE PREG, ED    EKG None  Radiology Dg Abdomen Acute W/chest  Result Date: 08/27/2018 CLINICAL DATA:  Worsening mid abdominal pain with nausea vomiting. EXAM: DG ABDOMEN ACUTE W/ 1V CHEST COMPARISON:  CT abdomen/pelvis 05/12/2018. FINDINGS: The lungs are clear without focal pneumonia, edema, pneumothorax or pleural effusion. The cardiopericardial silhouette is within normal limits for size. The visualized bony structures of the thorax are intact. Telemetry leads overlie the chest. Upright film shows no evidence for intraperitoneal free air. There is no evidence for gaseous bowel dilation to suggest obstruction. Increased density of contents in the right colon may be from contrast study or ingested material. Multiple phleboliths noted over the lower pelvis. Visualized bony anatomy unremarkable. IMPRESSION: 1. No acute cardiopulmonary findings. 2. No evidence for bowel perforation or obstruction. Electronically Signed   By: Misty Stanley M.D.   On: 08/27/2018 09:20    Procedures Procedures (including critical care time)  Medications Ordered in ED Medications  sodium chloride flush (NS) 0.9 % injection 3 mL (has no administration in time range)  ondansetron (ZOFRAN) injection 4 mg (4 mg Intravenous Given 08/27/18 0614)  metoCLOPramide (REGLAN) injection 10  mg (10 mg Intravenous Given 08/27/18 0735)  sodium chloride 0.9 % bolus 1,000 mL (1,000 mLs Intravenous New Bag/Given 08/27/18 0956)  haloperidol lactate (HALDOL) injection 5 mg (5 mg Intravenous Given 08/27/18 0955)     Initial Impression / Assessment and Plan / ED Course  I have reviewed the triage vital signs and the nursing notes.  Pertinent labs & imaging results that were available during my care of the patient were reviewed by me and considered in my medical decision making (see chart for details).        Patient with along with nausea and vomiting.  History of same.  Gastroparesis versus cannabinoid hyperemesis.  Lab work overall reassuring.  No relief with Reglan.  However feels somewhat better after Haldol.  Has not had this before.  Will discharge home with outpatient follow-up.  Final Clinical Impressions(s) / ED Diagnoses   Final diagnoses:  Non-intractable vomiting with nausea, unspecified vomiting type    ED Discharge Orders         Ordered    haloperidol (HALDOL) 2 MG tablet  Every 12 hours PRN     08/27/18 1259           Davonna Belling, MD 08/27/18 1312

## 2018-08-27 NOTE — ED Triage Notes (Signed)
Patient complaining of abdominal pain. Patient states that she was seen on August 23, 2018. Patient states she is still not having relief.

## 2018-08-27 NOTE — ED Notes (Signed)
Patient transported to X-ray 

## 2018-08-27 NOTE — ED Notes (Signed)
Pt provided water, tolerated well.  

## 2018-08-27 NOTE — ED Notes (Signed)
Pt d/c home per MD order. Discharge summary reviewed with pt, pt verbalizes understanding. No s/s of acute distress noted, pt ambulatory off unit.

## 2018-08-27 NOTE — ED Notes (Signed)
Attempted to get IV access x2, Anderson Malta, RN attempted x1, all unsuccessful.

## 2018-09-04 ENCOUNTER — Ambulatory Visit: Payer: Medicare Other | Admitting: Family Medicine

## 2018-09-07 ENCOUNTER — Other Ambulatory Visit: Payer: Self-pay

## 2018-09-07 ENCOUNTER — Ambulatory Visit (INDEPENDENT_AMBULATORY_CARE_PROVIDER_SITE_OTHER): Payer: Medicare Other | Admitting: Family Medicine

## 2018-09-07 ENCOUNTER — Encounter: Payer: Self-pay | Admitting: Family Medicine

## 2018-09-07 VITALS — BP 107/59 | HR 94 | Temp 99.7°F | Ht 68.0 in | Wt 269.6 lb

## 2018-09-07 DIAGNOSIS — R110 Nausea: Secondary | ICD-10-CM | POA: Diagnosis not present

## 2018-09-07 DIAGNOSIS — Z23 Encounter for immunization: Secondary | ICD-10-CM | POA: Diagnosis not present

## 2018-09-07 DIAGNOSIS — E876 Hypokalemia: Secondary | ICD-10-CM | POA: Diagnosis not present

## 2018-09-07 DIAGNOSIS — Z09 Encounter for follow-up examination after completed treatment for conditions other than malignant neoplasm: Secondary | ICD-10-CM | POA: Diagnosis not present

## 2018-09-07 DIAGNOSIS — R112 Nausea with vomiting, unspecified: Secondary | ICD-10-CM

## 2018-09-07 DIAGNOSIS — N83202 Unspecified ovarian cyst, left side: Secondary | ICD-10-CM | POA: Diagnosis not present

## 2018-09-07 MED ORDER — PROCHLORPERAZINE MALEATE 10 MG PO TABS: 10.0000 mg | ORAL_TABLET | Freq: Four times a day (QID) | ORAL | 3 refills | Status: DC | PRN

## 2018-09-07 MED ORDER — POTASSIUM CHLORIDE CRYS ER 20 MEQ PO TBCR: 20.0000 meq | EXTENDED_RELEASE_TABLET | Freq: Every day | ORAL | 3 refills | Status: DC

## 2018-09-07 NOTE — Patient Instructions (Signed)
Hyperkalemia °Hyperkalemia is when you have too much potassium in your blood. Potassium helps your body in many ways, but having too much can cause problems. If there is too much potassium in your blood, it can affect how your heart works. °Potassium is normally removed from your body by your kidneys. Many things can cause the amount in your blood to be high. Medicines and other treatments can be used to bring the amount to a normal level. Treatment may need to be done in the hospital. °Follow these instructions at home: ° °· Take over-the-counter and prescription medicines only as told by your doctor. °· Do not take any of the following unless your doctor says it is okay: °? Supplements. °? Natural products. °? Herbs. °? Vitamins. °· Limit how much alcohol you drink as told by your doctor. °· Do not use drugs. If you need help quitting, ask your doctor. °· If you have kidney disease, you may need to follow a low-potassium diet. A food specialist (dietitian) can help you. °· Keep all follow-up visits as told by your doctor. This is important. °Contact a doctor if: °· Your heartbeat is not regular or is very slow. °· You feel dizzy (light-headed). °· You feel weak. °· You feel sick to your stomach (nauseous). °· You have tingling in your hands or feet. °· You lose feeling (have numbness) in your hands or feet. °Get help right away if: °· You are short of breath. °· You have chest pain. °· You pass out (faint). °· You cannot move your muscles. °Summary °· Hyperkalemia is when you have too much potassium in your blood. °· Take over-the-counter and prescription medicines only as told by your doctor. °· Limit how much alcohol you drink as told by your doctor. °· Contact a doctor if your heartbeat is not regular. °This information is not intended to replace advice given to you by your health care provider. Make sure you discuss any questions you have with your health care provider. °Document Released: 12/24/2004 Document  Revised: 12/09/2016 Document Reviewed: 12/09/2016 °Elsevier Patient Education © 2020 Else[employer] Inc. ° °

## 2018-09-07 NOTE — Progress Notes (Signed)
Patient Haley Freeman and [facility] Follow Up  Subjective:  Patient ID: Haley Freeman, female    DOB: August 21, 1975  Age: 43 y.o. MRN: [id number]  CC:  Chief Complaint  Patient presents with  . Follow-up    3 month follow up ,  still having a gas, and dicuss getting a referral ob/gyn , dicuss weight loss meds    HPI Haley Freeman is a 43 year old female who presents for Follow Up today.   Past Medical History:  Diagnosis Date  . Alcohol abuse   . Anxiety   . Chronic anemia 05/04/2018  . Depression   . Headache(784.0)   . History of cholecystectomy 2019  . Hypokalemia   . Neuromuscular disorder ([place])   . S/P removal of right ovary 08/2017   Current Status: Since her last office visit, she has had several ED visits for Vomiting and Abdominal Bloating. She is doing well with no complaints. She states that she currently drinks no alcoholic beverages a day. Her anxiety is moderate today. She denies suicidal ideations, homicidal ideations, or auditory hallucinations.  She denies fevers, chills, fatigue, recent infections, weight loss, and night sweats. She has not had any headaches, visual changes, dizziness, and falls. No chest pain, heart palpitations, cough and shortness of breath reported. No reports of GI problems such as diarrhea, and constipation. She has no reports of blood in stools, dysuria and hematuria. She denies pain today.   Past Surgical History:  Procedure Laterality Date  . CESAREAN SECTION  '99 and '08   x 2  . CHOLECYSTECTOMY N/A 10/01/2017   Procedure: LAPAROSCOPIC CHOLECYSTECTOMY WITH POSSIBLE  INTRAOPERATIVE CHOLANGIOGRAM;  Surgeon: Stark Klein, MD;  Location: [facility];  Service: General;  Laterality: N/A;  . ESOPHAGOGASTRODUODENOSCOPY N/A 09/16/2017   Procedure: ESOPHAGOGASTRODUODENOSCOPY (EGD) WITH PROPOFOL;  Surgeon: Clarene Essex, MD;  Location: [facility];  Service: Endoscopy;  Laterality: N/A;  . LAPAROSCOPY Right  08/22/2017   Procedure: LAPAROSCOPY OPERATIVE removal of right ovary and right dermoid cyst;  Surgeon: Sloan Leiter, MD;  Location: [facility];  Service: Gynecology;  Laterality: Right;    Family History  Problem Relation Age of Onset  . Hypertension Mother     Social History   Socioeconomic History  . Marital status: Divorced    Spouse name: Not on file  . Number of children: Not on file  . Years of education: Not on file  . Highest education level: Not on file  Occupational History  . Not on file  Social Needs  . Financial resource strain: Not on file  . Food insecurity    Worry: Not on file    Inability: Not on file  . Transportation needs    Medical: Not on file    Non-medical: Not on file  Tobacco Use  . Smoking status: Former Smoker    Packs/day: 0.50  . Smokeless tobacco: Never Used  Substance and Sexual Activity  . Alcohol use: Not Currently  . Drug use: Yes    Types: Other-see comments, Marijuana    Comment: Occasionally smokes marijuana   . Sexual activity: Not Currently    Birth control/protection: None  Lifestyle  . Physical activity    Days per week: Not on file    Minutes per session: Not on file  . Stress: Not on file  Relationships  . Social Herbalist on phone: Not on file    Gets together: Not on file  Attends religious service: Not on file    Active member of club or organization: Not on file    Attends meetings of clubs or organizations: Not on file    Relationship status: Not on file  . Intimate partner violence    Fear of current or ex partner: Not on file    Emotionally abused: Not on file    Physically abused: Not on file    Forced sexual activity: Not on file  Other Topics Concern  . Not on file  Social History Narrative  . Not on file    Outpatient Medications Prior to Visit  Medication Sig Dispense Refill  . diphenhydrAMINE (BENADRYL) 25 mg capsule Take 25 mg by mouth every 6 (six) hours as needed for allergies or  sleep.    . haloperidol (HALDOL) 2 MG tablet Take 1 tablet (2 mg total) by mouth every 12 (twelve) hours as needed (vomiting). 6 tablet 0  . metoCLOPramide (REGLAN) 5 MG tablet Take 5 mg by mouth 3 (three) times daily.    . mirtazapine (REMERON) 45 MG tablet Take 45 mg by mouth at bedtime.   2  . promethazine (PHENERGAN) 25 MG suppository Place 1 suppository (25 mg total) rectally every 6 (six) hours as needed for nausea or vomiting. 24 each 0  . Simethicone (GAS-X PO) Take by mouth.    . traZODone (DESYREL) 100 MG tablet Take 300 mg by mouth at bedtime.     . Loperamide HCl (IMODIUM A-D PO) Take by mouth.    . psyllium (METAMUCIL) 0.52 g capsule Take 1 capsule (0.52 g total) by mouth daily. (Patient not taking: Reported on 09/07/2018) 30 capsule 0   No facility-administered medications prior to visit.     Allergies  Allergen Reactions  . Penicillins Anaphylaxis    Swells throat. Has patient had a PCN reaction causing immediate rash, facial/tongue/throat swelling, SOB or lightheadedness with hypotension: Yes Has patient had a PCN reaction causing severe rash involving mucus membranes or skin necrosis: No Has patient had a PCN reaction that required hospitalization: Yes Has patient had a PCN reaction occurring within the last 10 years: No If all of the above answers are "NO", then may proceed with Cephalosporin use.   . Strawberry (Diagnostic) Shortness Of Breath    ROS Review of Systems  Constitutional: Negative.   HENT: Negative.   Eyes: Negative.   Respiratory: Negative.   Cardiovascular: Negative.   Gastrointestinal: Positive for nausea (occasional ).  Endocrine: Negative.   Genitourinary: Negative.   Musculoskeletal: Negative.   Skin: Negative.   Allergic/Immunologic: Negative.   Neurological: Negative.   Hematological: Negative.   Psychiatric/Behavioral: Negative.       Objective:    Physical Exam  Constitutional: She is oriented to person, place, and time. She  appears well-developed and well-nourished.  HENT:  Head: Normocephalic and atraumatic.  Eyes: Conjunctivae are normal.  Neck: Normal range of motion. Neck supple.  Cardiovascular: Normal rate, regular rhythm, normal heart sounds and intact distal pulses.  Pulmonary/Chest: Effort normal and breath sounds normal.  Abdominal: Soft. Bowel sounds are normal. She exhibits distension (obese).  Musculoskeletal: Normal range of motion.  Neurological: She is alert and oriented to person, place, and time. She has normal reflexes.  Skin: Skin is warm and dry.  Psychiatric: She has a normal mood and affect. Her behavior is normal. Judgment and thought content normal.  Nursing note and vitals reviewed.   BP (!) 107/59 (BP Location: Left Arm, Patient Position: Sitting,  Cuff Size: Large)   Pulse 94   Temp 99.7 F (37.6 C) (Oral)   Ht 5\' 8"  (1.727 m)   Wt 269 lb 9.6 oz (122.3 kg)   LMP 08/18/2018   SpO2 98%   BMI 40.99 kg/m  Wt Readings from Last 3 Encounters:  09/07/18 269 lb 9.6 oz (122.3 kg)  08/27/18 291 lb (132 kg)  06/03/18 (!) 310 lb 12.8 oz (141 kg)     Health Maintenance Due  Topic Date Due  . PAP SMEAR-Modifier  01/25/1996  . INFLUENZA VACCINE  08/08/2018    There are no preventive care reminders to display for this patient.  Lab Results  Component Value Date   TSH 0.895 06/03/2018   Lab Results  Component Value Date   WBC 17.9 (H) 08/27/2018   HGB 11.0 (L) 08/27/2018   HCT 37.4 08/27/2018   MCV 74.7 (L) 08/27/2018   PLT 225 08/27/2018   Lab Results  Component Value Date   NA 138 08/27/2018   K 3.4 (L) 08/27/2018   CO2 20 (L) 08/27/2018   GLUCOSE 114 (H) 08/27/2018   BUN 10 08/27/2018   CREATININE 0.99 08/27/2018   BILITOT 0.3 08/27/2018   ALKPHOS 95 08/27/2018   AST 22 08/27/2018   ALT 13 08/27/2018   PROT 7.7 08/27/2018   ALBUMIN 3.8 08/27/2018   CALCIUM 9.2 08/27/2018   ANIONGAP 13 08/27/2018   Lab Results  Component Value Date   CHOL 183 06/03/2018    Lab Results  Component Value Date   HDL 42 06/03/2018   Lab Results  Component Value Date   LDLCALC 103 (H) 06/03/2018   Lab Results  Component Value Date   TRIG 188 (H) 06/03/2018   Lab Results  Component Value Date   CHOLHDL 4.4 06/03/2018   Lab Results  Component Value Date   HGBA1C 6.3 (H) 10/29/2017      Assessment & Plan:   1. Hospital discharge follow-up  2. Nausea and vomiting, intractability of vomiting not specified, unspecified vomiting type Stable today.  - Ambulatory referral to Gastroenterology  3. Nausea We will initiate anti-emetic today.  - prochlorperazine (COMPAZINE) 10 MG tablet; Take 1 tablet (10 mg total) by mouth every 6 (six) hours as needed for nausea or vomiting.  Dispense: 30 tablet; Refill: 3  4. Hypokalemia We will initiate Potassium today.  - potassium chloride SA (K-DUR) 20 MEQ tablet; Take 1 tablet (20 mEq total) by mouth daily.  Dispense: 30 tablet; Refill: 3  5. Cyst of left ovary - Ambulatory referral to Obstetrics / Gynecology  6. Need for influenza vaccination - Flu Vaccine QUAD 6+ mos PF IM (Fluarix Quad PF)  7. Follow up She will follow up in 6 months.   Meds ordered this encounter  Medications  . prochlorperazine (COMPAZINE) 10 MG tablet    Sig: Take 1 tablet (10 mg total) by mouth every 6 (six) hours as needed for nausea or vomiting.    Dispense:  30 tablet    Refill:  3  . potassium chloride SA (K-DUR) 20 MEQ tablet    Sig: Take 1 tablet (20 mEq total) by mouth daily.    Dispense:  30 tablet    Refill:  3    Orders Placed This Encounter  Procedures  . Flu Vaccine QUAD 6+ mos PF IM (Fluarix Quad PF)  . Ambulatory referral to Gastroenterology  . Ambulatory referral to Obstetrics / Gynecology     Referral Orders     Ambulatory  referral to Gastroenterology     Ambulatory referral to Obstetrics / Gynecology   Kathe Becton,  MSN, FNP-BC [facility] [address] [place], [place] [address] [phone] fax   Problem List Items Addressed This Visit      Other   Hypokalemia   Relevant Medications   potassium chloride SA (K-DUR) 20 MEQ tablet    Other Visit Diagnoses    Hospital discharge follow-up    -  Primary   Nausea and vomiting, intractability of vomiting not specified, unspecified vomiting type       Relevant Orders   Ambulatory referral to Gastroenterology   Nausea       Relevant Medications   prochlorperazine (COMPAZINE) 10 MG tablet   Cyst of left ovary       Relevant Orders   Ambulatory referral to Obstetrics / Gynecology   Need for influenza vaccination       Relevant Orders   Flu Vaccine QUAD 6+ mos PF IM (Fluarix Quad PF) (Completed)   Follow up          Meds ordered this encounter  Medications  . prochlorperazine (COMPAZINE) 10 MG tablet    Sig: Take 1 tablet (10 mg total) by mouth every 6 (six) hours as needed for nausea or vomiting.    Dispense:  30 tablet    Refill:  3  . potassium chloride SA (K-DUR) 20 MEQ tablet    Sig: Take 1 tablet (20 mEq total) by mouth daily.    Dispense:  30 tablet    Refill:  3    Follow-up: Return in about 6 months (around 03/07/2019).    Azzie Glatter, FNP

## 2018-10-23 DIAGNOSIS — R1115 Cyclical vomiting syndrome unrelated to migraine: Secondary | ICD-10-CM | POA: Diagnosis not present

## 2018-10-23 DIAGNOSIS — R109 Unspecified abdominal pain: Secondary | ICD-10-CM | POA: Diagnosis not present

## 2018-10-23 DIAGNOSIS — G8929 Other chronic pain: Secondary | ICD-10-CM | POA: Diagnosis not present

## 2018-11-03 DIAGNOSIS — K3184 Gastroparesis: Secondary | ICD-10-CM | POA: Diagnosis not present

## 2018-11-03 DIAGNOSIS — R1115 Cyclical vomiting syndrome unrelated to migraine: Secondary | ICD-10-CM | POA: Diagnosis not present

## 2018-12-18 DIAGNOSIS — R1115 Cyclical vomiting syndrome unrelated to migraine: Secondary | ICD-10-CM | POA: Diagnosis not present

## 2018-12-26 ENCOUNTER — Other Ambulatory Visit: Payer: Self-pay | Admitting: Family Medicine

## 2018-12-26 DIAGNOSIS — E876 Hypokalemia: Secondary | ICD-10-CM

## 2019-03-05 ENCOUNTER — Telehealth: Payer: Self-pay | Admitting: Family Medicine

## 2019-03-05 NOTE — Telephone Encounter (Signed)
Pt was called and reminded of there appointment 

## 2019-03-08 ENCOUNTER — Ambulatory Visit: Payer: Medicare Other | Admitting: Family Medicine

## 2019-03-15 ENCOUNTER — Telehealth: Payer: Self-pay | Admitting: Family Medicine

## 2019-03-15 NOTE — Telephone Encounter (Signed)
Called pt. No answer. VoiceMailbox full. Unable to leave message.

## 2019-03-16 ENCOUNTER — Ambulatory Visit (INDEPENDENT_AMBULATORY_CARE_PROVIDER_SITE_OTHER): Payer: Medicare Other | Admitting: Family Medicine

## 2019-03-16 ENCOUNTER — Encounter: Payer: Self-pay | Admitting: Family Medicine

## 2019-03-16 ENCOUNTER — Other Ambulatory Visit: Payer: Self-pay

## 2019-03-16 VITALS — BP 98/70 | HR 102 | Temp 99.1°F | Ht 68.0 in | Wt 302.6 lb

## 2019-03-16 DIAGNOSIS — E66813 Obesity, class 3: Secondary | ICD-10-CM

## 2019-03-16 DIAGNOSIS — Z09 Encounter for follow-up examination after completed treatment for conditions other than malignant neoplasm: Secondary | ICD-10-CM

## 2019-03-16 DIAGNOSIS — F319 Bipolar disorder, unspecified: Secondary | ICD-10-CM

## 2019-03-16 DIAGNOSIS — Z0000 Encounter for general adult medical examination without abnormal findings: Secondary | ICD-10-CM

## 2019-03-16 DIAGNOSIS — Z90721 Acquired absence of ovaries, unilateral: Secondary | ICD-10-CM

## 2019-03-16 DIAGNOSIS — R739 Hyperglycemia, unspecified: Secondary | ICD-10-CM | POA: Diagnosis not present

## 2019-03-16 DIAGNOSIS — R112 Nausea with vomiting, unspecified: Secondary | ICD-10-CM | POA: Diagnosis not present

## 2019-03-16 DIAGNOSIS — Z6841 Body Mass Index (BMI) 40.0 and over, adult: Secondary | ICD-10-CM | POA: Diagnosis not present

## 2019-03-16 LAB — GLUCOSE, POCT (MANUAL RESULT ENTRY): POC Glucose: 108 mg/dl — AB (ref 70–99)

## 2019-03-16 LAB — POCT GLYCOSYLATED HEMOGLOBIN (HGB A1C): Hemoglobin A1C: 5.5 % (ref 4.0–5.6)

## 2019-03-16 MED ORDER — ONDANSETRON 4 MG PO FILM: 4.0000 mg | ORAL_FILM | Freq: Three times a day (TID) | ORAL | 2 refills | Status: DC | PRN

## 2019-03-16 NOTE — Progress Notes (Signed)
Patient [facility]   Established Patient Office Visit  Subjective:  Patient ID: Haley Freeman, female    DOB: May 05, 1975  Age: 44 y.o. MRN: [id number]  CC:  Chief Complaint  Patient presents with  . Follow-up    HPI Haley Freeman is a 44 year old female who presents for Follow Up today.   Past Medical History:  Diagnosis Date  . Alcohol abuse   . Anxiety   . Chronic anemia 05/04/2018  . Depression   . Headache(784.0)   . History of cholecystectomy 2019  . Hypokalemia   . Neuromuscular disorder ([place])   . S/P removal of right ovary 08/2017   Current Status: Since her last office visit, she is doing well with no complaints. She denies fevers, chills, fatigue, recent infections, weight loss, and night sweats. She has not had any headaches, visual changes, dizziness, and falls. No chest pain, heart palpitations, cough and shortness of breath reported. No reports of GI problems such as diarrhea, and constipation. She has no reports of blood in stools, dysuria and hematuria. No depression or anxiety reported today. She denies suicidal ideations, homicidal ideations, or auditory hallucinations. She denies pain today.   Past Surgical History:  Procedure Laterality Date  . CESAREAN SECTION  '99 and '08   x 2  . CHOLECYSTECTOMY N/A 10/01/2017   Procedure: LAPAROSCOPIC CHOLECYSTECTOMY WITH POSSIBLE  INTRAOPERATIVE CHOLANGIOGRAM;  Surgeon: Stark Klein, MD;  Location: [facility];  Service: General;  Laterality: N/A;  . ESOPHAGOGASTRODUODENOSCOPY N/A 09/16/2017   Procedure: ESOPHAGOGASTRODUODENOSCOPY (EGD) WITH PROPOFOL;  Surgeon: Clarene Essex, MD;  Location: [facility];  Service: Endoscopy;  Laterality: N/A;  . LAPAROSCOPY Right 08/22/2017   Procedure: LAPAROSCOPY OPERATIVE removal of right ovary and right dermoid cyst;  Surgeon: Sloan Leiter, MD;  Location: [facility];  Service: Gynecology;  Laterality: Right;    Family History  Problem  Relation Age of Onset  . Hypertension Mother     Social History   Socioeconomic History  . Marital status: Divorced    Spouse name: Not on file  . Number of children: Not on file  . Years of education: Not on file  . Highest education level: Not on file  Occupational History  . Not on file  Tobacco Use  . Smoking status: Former Smoker    Packs/day: 0.50  . Smokeless tobacco: Never Used  Substance and Sexual Activity  . Alcohol use: Not Currently  . Drug use: Yes    Types: Other-see comments, Marijuana    Comment: Occasionally smokes marijuana   . Sexual activity: Not Currently    Birth control/protection: None  Other Topics Concern  . Not on file  Social History Narrative  . Not on file   Social Determinants of Health   Financial Resource Strain:   . Difficulty of Paying Living Expenses: Not on file  Food Insecurity:   . Worried About Charity fundraiser in the Last Year: Not on file  . Ran Out of Food in the Last Year: Not on file  Transportation Needs:   . Lack of Transportation (Medical): Not on file  . Lack of Transportation (Non-Medical): Not on file  Physical Activity:   . Days of Exercise per Week: Not on file  . Minutes of Exercise per Session: Not on file  Stress:   . Feeling of Stress : Not on file  Social Connections:   . Frequency of Communication with Friends and Family:  Not on file  . Frequency of Social Gatherings with Friends and Family: Not on file  . Attends Religious Services: Not on file  . Active Member of Clubs or Organizations: Not on file  . Attends Archivist Meetings: Not on file  . Marital Status: Not on file  Intimate Partner Violence:   . Fear of Current or Ex-Partner: Not on file  . Emotionally Abused: Not on file  . Physically Abused: Not on file  . Sexually Abused: Not on file    Outpatient Medications Prior to Visit  Medication Sig Dispense Refill  . amitriptyline (ELAVIL) 100 MG tablet TAKE 1 TABLET BY MOUTH  EVERY DAY AT NIGHT    . diphenhydrAMINE (BENADRYL) 25 mg capsule Take 25 mg by mouth every 6 (six) hours as needed for allergies or sleep.    Marland Kitchen KLOR-CON M20 20 MEQ tablet TAKE 1 TABLET BY MOUTH EVERY DAY 90 tablet 1  . Loperamide HCl (IMODIUM A-D PO) Take by mouth.    . metoCLOPramide (REGLAN) 5 MG tablet Take 5 mg by mouth 3 (three) times daily.    . mirtazapine (REMERON) 45 MG tablet Take 45 mg by mouth at bedtime.   2  . traZODone (DESYREL) 100 MG tablet Take 300 mg by mouth at bedtime.     . Simethicone (GAS-X PO) Take by mouth.    Marland Kitchen amitriptyline (ELAVIL) 10 MG tablet Take 10 mg by mouth at bedtime.    . haloperidol (HALDOL) 2 MG tablet Take 1 tablet (2 mg total) by mouth every 12 (twelve) hours as needed (vomiting). 6 tablet 0  . hydrOXYzine (ATARAX/VISTARIL) 25 MG tablet Take 25 mg by mouth 2 (two) times daily.    . prochlorperazine (COMPAZINE) 10 MG tablet Take 1 tablet (10 mg total) by mouth every 6 (six) hours as needed for nausea or vomiting. 30 tablet 3  . promethazine (PHENERGAN) 25 MG suppository Place 1 suppository (25 mg total) rectally every 6 (six) hours as needed for nausea or vomiting. 24 each 0  . psyllium (METAMUCIL) 0.52 g capsule Take 1 capsule (0.52 g total) by mouth daily. (Patient not taking: Reported on 09/07/2018) 30 capsule 0   No facility-administered medications prior to visit.    Allergies  Allergen Reactions  . Penicillins Anaphylaxis    Swells throat. Has patient had a PCN reaction causing immediate rash, facial/tongue/throat swelling, SOB or lightheadedness with hypotension: Yes Has patient had a PCN reaction causing severe rash involving mucus membranes or skin necrosis: No Has patient had a PCN reaction that required hospitalization: Yes Has patient had a PCN reaction occurring within the last 10 years: No If all of the above answers are "NO", then may proceed with Cephalosporin use.   . Strawberry (Diagnostic) Shortness Of Breath    ROS Review of  Systems  Constitutional: Negative.   HENT: Negative.   Eyes: Negative.   Respiratory: Negative.   Cardiovascular: Negative.   Gastrointestinal: Positive for abdominal distention (obese), nausea (frequent) and vomiting (frequent).  Endocrine: Negative.   Genitourinary: Negative.   Musculoskeletal: Negative.   Skin: Negative.   Allergic/Immunologic: Negative.   Neurological: Negative.   Hematological: Negative.   Psychiatric/Behavioral: Negative.       Objective:    Physical Exam  Constitutional: She is oriented to person, place, and time. She appears well-developed and well-nourished.  HENT:  Head: Normocephalic and atraumatic.  Eyes: Conjunctivae are normal.  Cardiovascular: Normal rate, regular rhythm, normal heart sounds and intact distal pulses.  Pulmonary/Chest: Effort normal and breath sounds normal.  Abdominal: Soft. Bowel sounds are normal. She exhibits distension (obese).  Musculoskeletal:        General: Normal range of motion.     Cervical back: Normal range of motion and neck supple.  Neurological: She is alert and oriented to person, place, and time. She has normal reflexes.  Skin: Skin is warm and dry.  Psychiatric: She has a normal mood and affect. Her behavior is normal. Judgment and thought content normal.    BP 98/70   Pulse (!) 102   Temp 99.1 F (37.3 C) (Oral)   Ht 5\' 8"  (1.727 m)   Wt (!) 302 lb 9.6 oz (137.3 kg)   LMP 03/16/2019   SpO2 100%   BMI 46.01 kg/m  Wt Readings from Last 3 Encounters:  03/16/19 (!) 302 lb 9.6 oz (137.3 kg)  09/07/18 269 lb 9.6 oz (122.3 kg)  08/27/18 291 lb (132 kg)     Health Maintenance Due  Topic Date Due  . PAP SMEAR-Modifier  01/25/1996    There are no preventive care reminders to display for this patient.  Lab Results  Component Value Date   TSH 0.895 06/03/2018   Lab Results  Component Value Date   WBC 17.9 (H) 08/27/2018   HGB 11.0 (L) 08/27/2018   HCT 37.4 08/27/2018   MCV 74.7 (L) 08/27/2018    PLT 225 08/27/2018   Lab Results  Component Value Date   NA 138 08/27/2018   K 3.4 (L) 08/27/2018   CO2 20 (L) 08/27/2018   GLUCOSE 114 (H) 08/27/2018   BUN 10 08/27/2018   CREATININE 0.99 08/27/2018   BILITOT 0.3 08/27/2018   ALKPHOS 95 08/27/2018   AST 22 08/27/2018   ALT 13 08/27/2018   PROT 7.7 08/27/2018   ALBUMIN 3.8 08/27/2018   CALCIUM 9.2 08/27/2018   ANIONGAP 13 08/27/2018   Lab Results  Component Value Date   CHOL 183 06/03/2018   Lab Results  Component Value Date   HDL 42 06/03/2018   Lab Results  Component Value Date   LDLCALC 103 (H) 06/03/2018   Lab Results  Component Value Date   TRIG 188 (H) 06/03/2018   Lab Results  Component Value Date   CHOLHDL 4.4 06/03/2018   Lab Results  Component Value Date   HGBA1C 5.5 03/16/2019    Assessment & Plan:   1. Nausea and vomiting, intractability of vomiting not specified, unspecified vomiting type We will initiate Zofran today.  - Ondansetron 4 MG FILM; Take 4 mg by mouth every 8 (eight) hours as needed.  Dispense: 30 each; Refill: 2  2. Hyperglycemia - POCT glycosylated hemoglobin (Hb A1C) - POCT glucose (manual entry)  3. S/p removal of right ovary Removal on 08/21/2017. She will follow up with Dr. Cecille Po [facility] to assess left ovary.   4. Class 3 severe obesity due to excess calories without serious comorbidity with body mass index (BMI) of 45.0 to 49.9 in adult [facility]) Body mass index is 46.01 kg/m. Goal BMI  is <30. Encouraged efforts to reduce weight include engaging in physical activity as tolerated with goal of 150 minutes per week. Improve dietary choices and eat a meal regimen consistent with a Mediterranean or DASH diet. Reduce simple carbohydrates. Do not skip meals and eat healthy snacks throughout the day to avoid over-eating at dinner. Set a goal weight loss that is achievable for you. - Amb Referral to Bariatric Surgery  5. Bipolar disorder She  will continue to follow up with  Dr. Josph Macho for Psychiatric services.   6. Healthcare maintenance - POCT urinalysis dipstick  7. Follow up She will follow up in 6 months.   Meds ordered this encounter  Medications  . Ondansetron 4 MG FILM    Sig: Take 4 mg by mouth every 8 (eight) hours as needed.    Dispense:  30 each    Refill:  2    Orders Placed This Encounter  Procedures  . Amb Referral to Bariatric Surgery  . POCT urinalysis dipstick  . POCT glycosylated hemoglobin (Hb A1C)  . POCT glucose (manual entry)     Referral Orders     Amb Referral to Bariatric Surgery   Kathe Becton,  MSN, FNP-BC [address] [place], [place] [address] [phone] fax  Problem List Items Addressed This Visit      Endocrine   Cyst of left ovary    Other Visit Diagnoses    Nausea and vomiting, intractability of vomiting not specified, unspecified vomiting type    -  Primary   Relevant Medications   Ondansetron 4 MG FILM   Hyperglycemia       Relevant Orders   POCT glycosylated hemoglobin (Hb A1C) (Completed)   POCT glucose (manual entry) (Completed)   Class 3 severe obesity due to excess calories without serious comorbidity with body mass index (BMI) of 45.0 to 49.9 in adult [facility])       Relevant Orders   Amb Referral to Bariatric Surgery   Healthcare maintenance       Relevant Orders   POCT urinalysis dipstick   Follow up          Meds ordered this encounter  Medications  . Ondansetron 4 MG FILM    Sig: Take 4 mg by mouth every 8 (eight) hours as needed.    Dispense:  30 each    Refill:  2    Follow-up: Return in about 6 months (around 09/16/2019).    Azzie Glatter, FNP

## 2019-03-17 ENCOUNTER — Other Ambulatory Visit: Payer: Self-pay

## 2019-03-17 DIAGNOSIS — R112 Nausea with vomiting, unspecified: Secondary | ICD-10-CM

## 2019-03-17 MED ORDER — ONDANSETRON 4 MG PO FILM: ORAL_FILM | ORAL | 2 refills | Status: DC

## 2019-03-18 DIAGNOSIS — F319 Bipolar disorder, unspecified: Secondary | ICD-10-CM | POA: Insufficient documentation

## 2019-05-17 DIAGNOSIS — E782 Mixed hyperlipidemia: Secondary | ICD-10-CM | POA: Insufficient documentation

## 2019-05-17 DIAGNOSIS — F1011 Alcohol abuse, in remission: Secondary | ICD-10-CM | POA: Insufficient documentation

## 2019-05-17 DIAGNOSIS — K219 Gastro-esophageal reflux disease without esophagitis: Secondary | ICD-10-CM | POA: Insufficient documentation

## 2019-05-17 HISTORY — DX: Gastro-esophageal reflux disease without esophagitis: K21.9

## 2019-05-29 IMAGING — CT CT ABDOMEN AND PELVIS WITH CONTRAST
2 of 5 series · 17 of 46 positions shown, 19 images · IV contrast (ISOVUE)
Comparison: CT 10/24/2017, 09/27/2017

CLINICAL DATA: Epigastric pain with nausea vomiting and diarrhea

EXAM:
CT ABDOMEN AND PELVIS WITH CONTRAST
TECHNIQUE: Multidetector CT imaging of the abdomen and pelvis was performed
using the standard protocol following bolus administration of
intravenous contrast.
CONTRAST:  100mL OMNIPAQUE IOHEXOL 300 MG/ML  SOLN

[Series 3: axial st · axial · 0.84mm/px · z∈[-508,-103]mm · 14 of 93 slices shown, 16 images]
[im 6/93  soft-tissue]
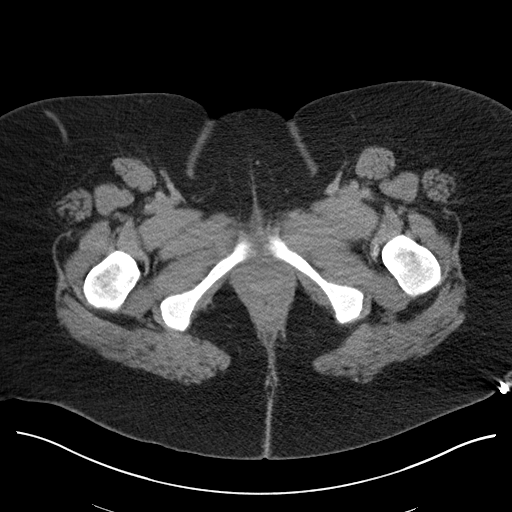
[im 6/93  bone]
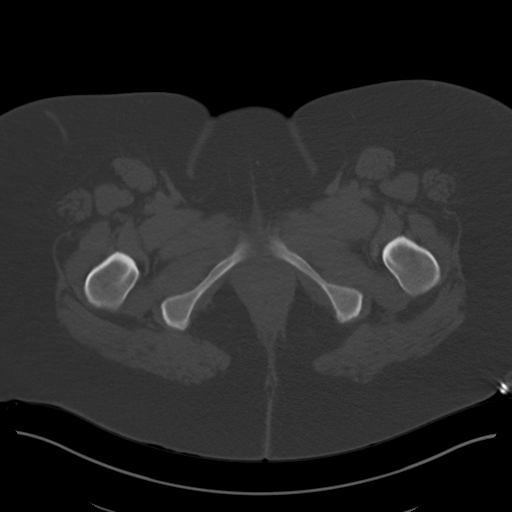
[im 12/93  soft-tissue]
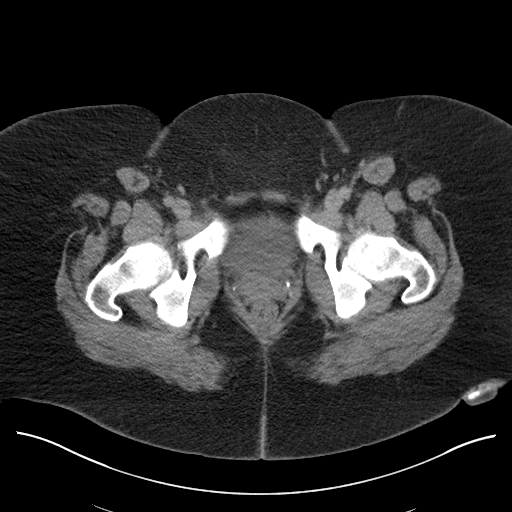
[im 18/93  soft-tissue]
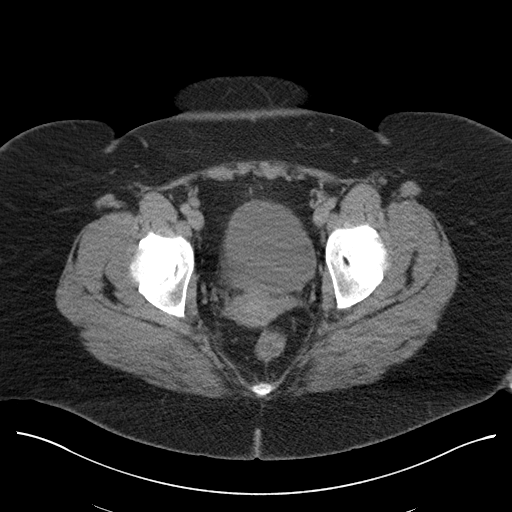
[im 24/93  soft-tissue]
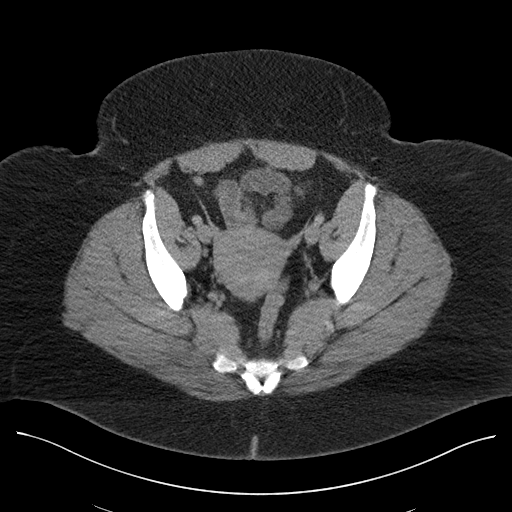
[im 29/93  soft-tissue]
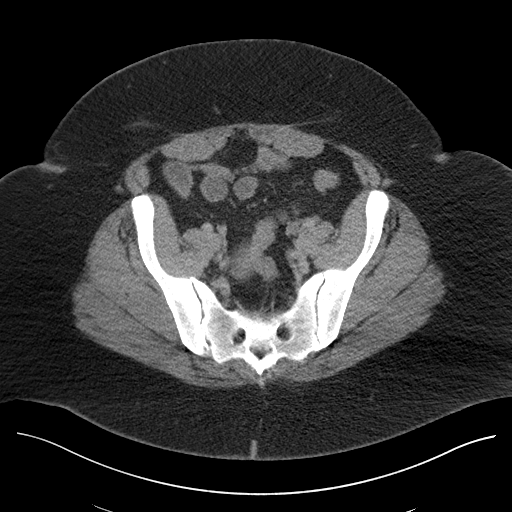
[im 35/93  soft-tissue]
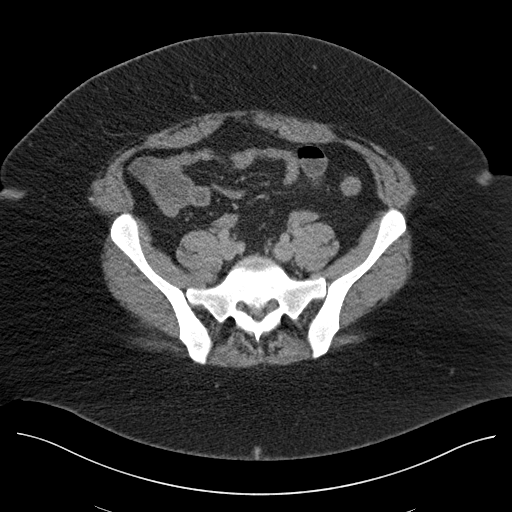
[im 41/93  soft-tissue]
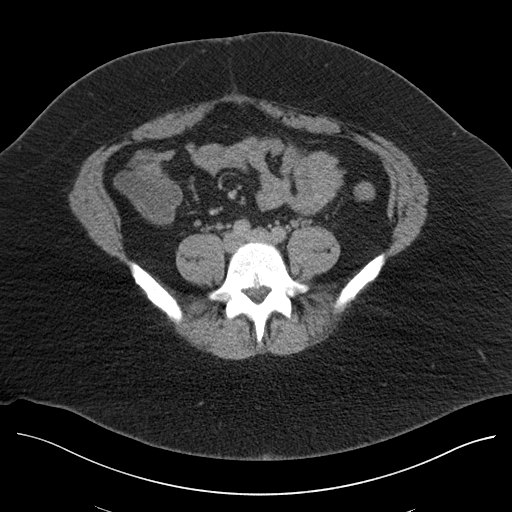
[im 52/93  soft-tissue]
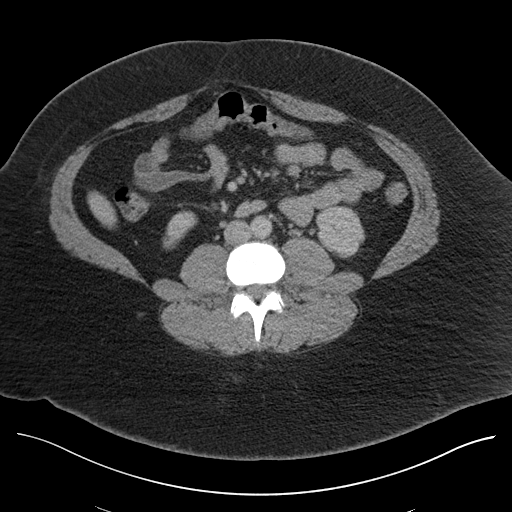
[im 58/93  soft-tissue]
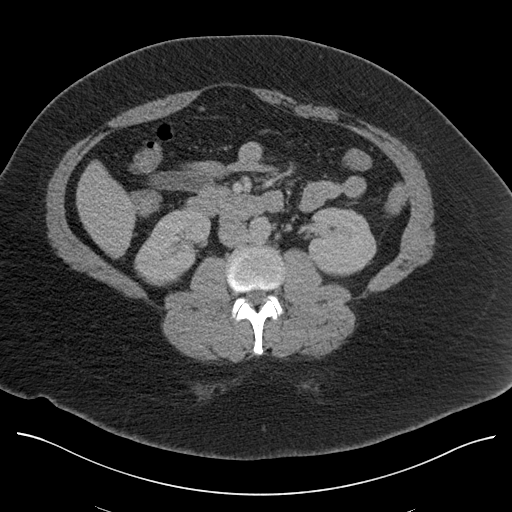
[im 58/93  bone]
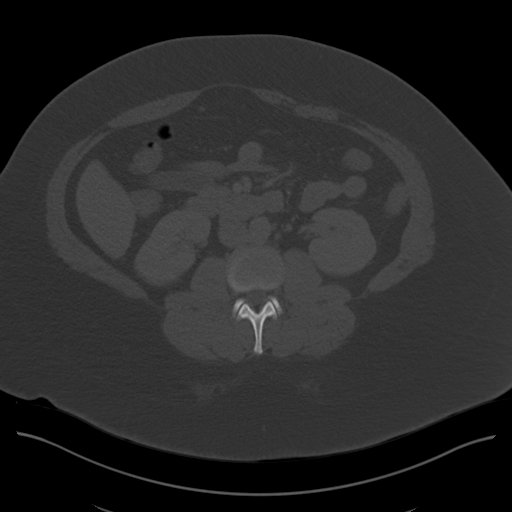
[im 64/93  soft-tissue]
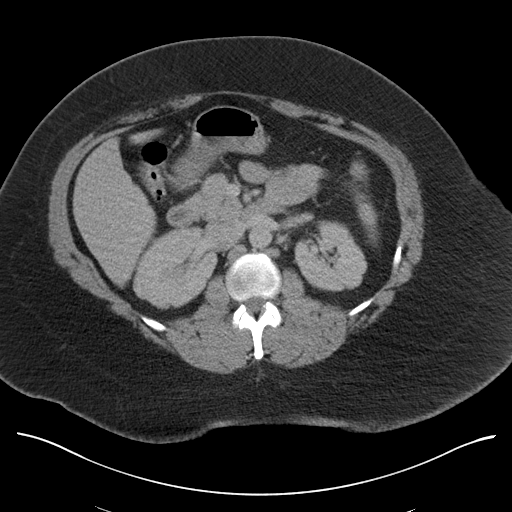
[im 70/93  soft-tissue]
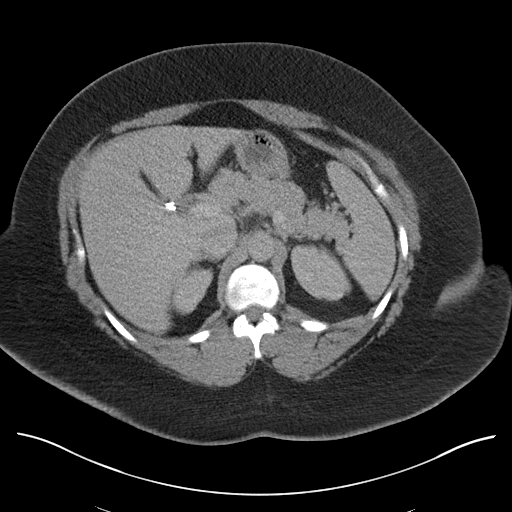
[im 75/93  soft-tissue]
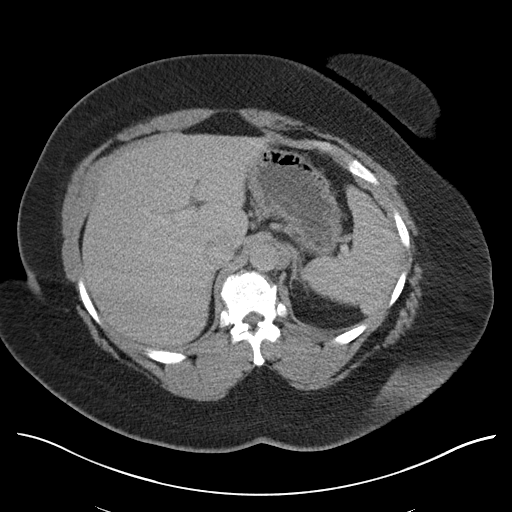
[im 81/93  soft-tissue]
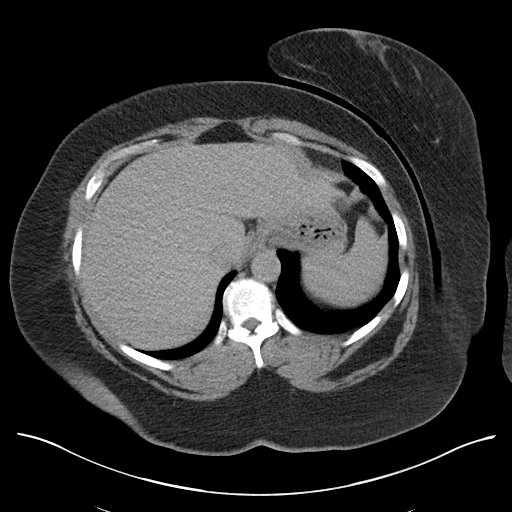
[im 87/93  soft-tissue]
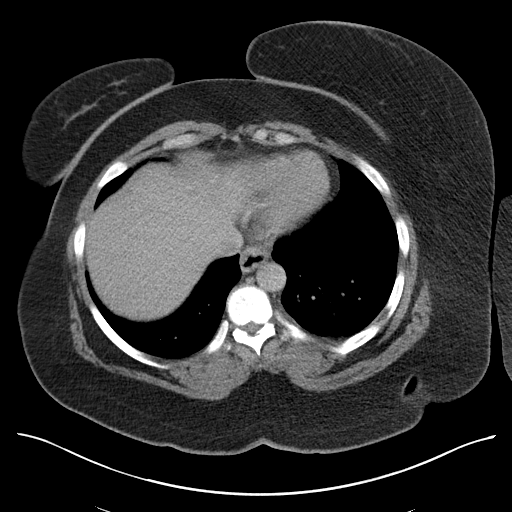

[Series 6: coronal st · coronal · 0.94mm/px · 3 of 161 slices shown]
[im 54/161  soft-tissue]
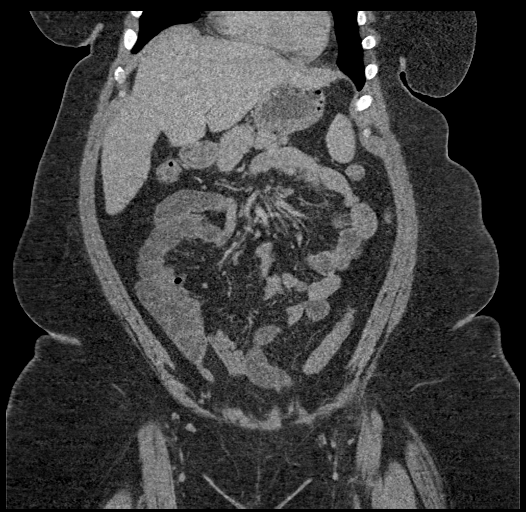
[im 72/161  soft-tissue]
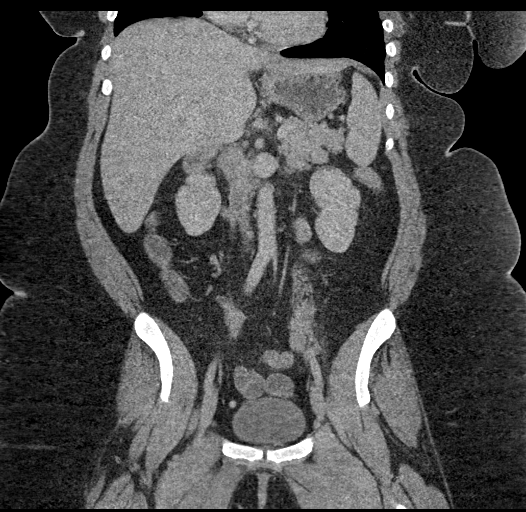
[im 89/161  soft-tissue]
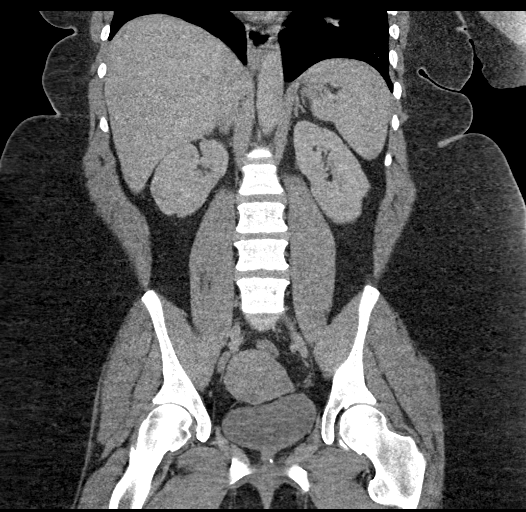

[17 of 46 positions shown; findings below may reference images not displayed]

FINDINGS: Lower chest: Lung bases demonstrate no acute consolidation or
pleural effusion. Heart size within normal limits. Small hiatal
hernia.

Hepatobiliary: Vague hypodensity in the posterior right hepatic lobe
as before. Status post cholecystectomy. No biliary enlargement

Pancreas: Unremarkable. No pancreatic ductal dilatation or
surrounding inflammatory changes.

Spleen: Normal in size without focal abnormality.

Adrenals/Urinary Tract: Right adrenal gland is normal. Probable fat
containing left adrenal mass measuring 2 cm. Kidneys are normal,
without renal calculi, focal lesion, or hydronephrosis. Bladder is
unremarkable.

Stomach/Bowel: Stomach is within normal limits. Appendix appears
normal. No evidence of bowel wall thickening, distention, or
inflammatory changes.

Vascular/Lymphatic: No significant vascular findings are present. No
enlarged abdominal or pelvic lymph nodes.

Reproductive: Uterus and bilateral adnexa are unremarkable.

Other: Negative for free air or free fluid. Small fat containing
umbilical and periumbilical hernias.

Musculoskeletal: No acute or significant osseous findings.
IMPRESSION: 1. No CT evidence for acute intra-abdominal or pelvic abnormality.
2. Other chronic findings as previously described

## 2019-07-04 ENCOUNTER — Other Ambulatory Visit: Payer: Self-pay | Admitting: Pediatrics

## 2019-07-04 DIAGNOSIS — E876 Hypokalemia: Secondary | ICD-10-CM

## 2019-07-14 ENCOUNTER — Other Ambulatory Visit: Payer: Self-pay

## 2019-07-14 ENCOUNTER — Emergency Department (HOSPITAL_COMMUNITY)
Admission: EM | Admit: 2019-07-14 | Discharge: 2019-07-14 | Disposition: A | Discharge status: Home / Self Care | Payer: Medicare Other | Attending: Emergency Medicine | Admitting: Emergency Medicine

## 2019-07-14 DIAGNOSIS — R1013 Epigastric pain: Secondary | ICD-10-CM | POA: Insufficient documentation

## 2019-07-14 DIAGNOSIS — Z87891 Personal history of nicotine dependence: Secondary | ICD-10-CM | POA: Insufficient documentation

## 2019-07-14 DIAGNOSIS — R112 Nausea with vomiting, unspecified: Secondary | ICD-10-CM | POA: Insufficient documentation

## 2019-07-14 DIAGNOSIS — Z79899 Other long term (current) drug therapy: Secondary | ICD-10-CM | POA: Insufficient documentation

## 2019-07-14 LAB — COMPREHENSIVE METABOLIC PANEL WITH GFR
ALT: 16 U/L (ref 0–44)
AST: 25 U/L (ref 15–41)
Albumin: 4.3 g/dL (ref 3.5–5.0)
Alkaline Phosphatase: 96 U/L (ref 38–126)
Anion gap: 15 (ref 5–15)
BUN: 14 mg/dL (ref 6–20)
CO2: 18 mmol/L — ABNORMAL LOW (ref 22–32)
Calcium: 9.9 mg/dL (ref 8.9–10.3)
Chloride: 106 mmol/L (ref 98–111)
Creatinine, Ser: 1.01 mg/dL — ABNORMAL HIGH (ref 0.44–1.00)
GFR calc Af Amer: >60 mL/min
GFR calc non Af Amer: >60 mL/min
Glucose, Bld: 164 mg/dL — ABNORMAL HIGH (ref 70–99)
Potassium: 3.6 mmol/L (ref 3.5–5.1)
Sodium: 139 mmol/L (ref 135–145)
Total Bilirubin: 0.4 mg/dL (ref 0.3–1.2)
Total Protein: 8.3 g/dL — ABNORMAL HIGH (ref 6.5–8.1)

## 2019-07-14 LAB — RAPID URINE DRUG SCREEN, HOSP PERFORMED
Amphetamines: NOT DETECTED
Barbiturates: NOT DETECTED
Benzodiazepines: NOT DETECTED
Cocaine: NOT DETECTED
Opiates: NOT DETECTED
Tetrahydrocannabinol: POSITIVE — AB

## 2019-07-14 LAB — CBC
HCT: 33.9 % — ABNORMAL LOW (ref 36.0–46.0)
Hemoglobin: 10.3 g/dL — ABNORMAL LOW (ref 12.0–15.0)
MCH: 22.7 pg — ABNORMAL LOW (ref 26.0–34.0)
MCHC: 30.4 g/dL (ref 30.0–36.0)
MCV: 74.8 fL — ABNORMAL LOW (ref 80.0–100.0)
Platelets: 343 10*3/uL (ref 150–400)
RBC: 4.53 MIL/uL (ref 3.87–5.11)
RDW: 17.4 % — ABNORMAL HIGH (ref 11.5–15.5)
WBC: 20.7 10*3/uL — ABNORMAL HIGH (ref 4.0–10.5)
nRBC: 0 % (ref 0.0–0.2)

## 2019-07-14 LAB — URINALYSIS, ROUTINE W REFLEX MICROSCOPIC
Bilirubin Urine: NEGATIVE
Glucose, UA: NEGATIVE mg/dL
Hgb urine dipstick: NEGATIVE
Ketones, ur: 80 mg/dL — AB
Nitrite: NEGATIVE
Protein, ur: 100 mg/dL — AB
Specific Gravity, Urine: 1.031 — ABNORMAL HIGH (ref 1.005–1.030)
pH: 6 (ref 5.0–8.0)

## 2019-07-14 LAB — I-STAT BETA HCG BLOOD, ED (MC, WL, AP ONLY): I-stat hCG, quantitative: <5 m[IU]/mL

## 2019-07-14 LAB — LIPASE, BLOOD: Lipase: 20 U/L (ref 11–51)

## 2019-07-14 MED ORDER — AMITRIPTYLINE HCL 25 MG PO TABS
100.0000 mg | ORAL_TABLET | ORAL | Status: AC
  Administered 2019-07-14: 100 mg via ORAL
  Filled 2019-07-14: qty 4

## 2019-07-14 MED ORDER — FAMOTIDINE 20 MG PO TABS
20.0000 mg | ORAL_TABLET | Freq: Two times a day (BID) | ORAL | 0 refills | Status: DC
Start: 2019-07-14 — End: 2019-11-10

## 2019-07-14 MED ORDER — CAPSAICIN 0.025 % EX CREA
TOPICAL_CREAM | CUTANEOUS | Status: AC
  Filled 2019-07-14: qty 60

## 2019-07-14 MED ORDER — AMITRIPTYLINE HCL 25 MG PO TABS: 100.0000 mg | ORAL_TABLET | Freq: Every day | ORAL | Status: DC

## 2019-07-14 MED ORDER — SODIUM CHLORIDE 0.9 % IV BOLUS
1000.0000 mL | Freq: Once | INTRAVENOUS | Status: AC
  Administered 2019-07-14: 1000 mL via INTRAVENOUS

## 2019-07-14 MED ORDER — METOCLOPRAMIDE HCL 10 MG PO TABS
10.0000 mg | ORAL_TABLET | Freq: Three times a day (TID) | ORAL | 0 refills | Status: DC | PRN
Start: 2019-07-14 — End: 2019-11-10

## 2019-07-14 MED ORDER — LORAZEPAM 2 MG/ML IJ SOLN
1.0000 mg | Freq: Once | INTRAMUSCULAR | Status: AC
  Administered 2019-07-14: 1 mg via INTRAVENOUS
  Filled 2019-07-14: qty 1

## 2019-07-14 MED ORDER — METOCLOPRAMIDE HCL 5 MG/ML IJ SOLN: 10.0000 mg | Freq: Once | INTRAMUSCULAR | Status: DC

## 2019-07-14 MED ORDER — DROPERIDOL 2.5 MG/ML IJ SOLN
2.5000 mg | Freq: Once | INTRAMUSCULAR | Status: AC
  Administered 2019-07-14: 2.5 mg via INTRAVENOUS
  Filled 2019-07-14: qty 2

## 2019-07-14 MED ORDER — FAMOTIDINE IN NACL 20-0.9 MG/50ML-% IV SOLN
20.0000 mg | Freq: Once | INTRAVENOUS | Status: AC
  Administered 2019-07-14: 20 mg via INTRAVENOUS
  Filled 2019-07-14: qty 50

## 2019-07-14 MED ORDER — SODIUM CHLORIDE 0.9% FLUSH
3.0000 mL | Freq: Once | INTRAVENOUS | Status: AC
  Administered 2019-07-14: 3 mL via INTRAVENOUS

## 2019-07-14 MED ORDER — DIPHENHYDRAMINE HCL 50 MG/ML IJ SOLN
25.0000 mg | Freq: Once | INTRAMUSCULAR | Status: AC
  Administered 2019-07-14: 25 mg via INTRAVENOUS
  Filled 2019-07-14: qty 1

## 2019-07-14 NOTE — ED Notes (Signed)
Delay in EKG and labs due to PT currently in restroom

## 2019-07-14 NOTE — ED Notes (Signed)
Patient vomiting again. Discharge held per [place], [place].

## 2019-07-14 NOTE — ED Triage Notes (Signed)
Patient arrived stating she has [facility] and is having an episode that started yesterday. States she takes amitriptyline daily. Complaints of upper abdominal pain, patient pacing room and there is difficulty getting information/vitals

## 2019-07-14 NOTE — ED Provider Notes (Signed)
[facility] DEPT Provider Note   CSN: [id number] Arrival date & time: 07/14/19  0458     History Chief Complaint  Patient presents with  . Emesis    Haley Freeman is a 44 y.o. female.  HPI  Patient is a 44 year old female with past medical history of chronic marijuana use, gastroparesis, anxiety on amitriptyline.   Patient states that she ran out of her amitriptyline that she takes daily several days ago and states that she started experiencing worsening nausea and vomiting since.  She states that she suffers from daily symptoms of anxiety which resulted in her experiencing nausea and vomiting.  Patient endorses epigastric abdominal pain and gas but she states that the symptoms have been ongoing for quite some time.  She states that she follows with gastroenterology and has been told "it is not the marijuana ".  Patient describes the pain as aching constant associated with "bloated" she states that it is relieved somewhat with bulging.  She states normal bowel movements no diarrhea or constipation.  She denies any blood or dark tarry stools.  She denies any dysuria, frequency urgency.  Denies any fevers or chills.  On my review of EMR it appears that patient has had frequent episodes of similar symptoms in the past.  Has received antinausea medications been p.o. challenged and discharged in the past.  Patient states that her goal is to be able to keep down her amitriptyline.     Past Medical History:  Diagnosis Date  . Alcohol abuse   . Anxiety   . Chronic anemia 05/04/2018  . Depression   . Headache(784.0)   . History of cholecystectomy 2019  . Hypokalemia   . Neuromuscular disorder ([place])   . S/P removal of right ovary 08/2017    Patient Active Problem List   Diagnosis Date Noted  . Bipolar disorder with depression ([place]) 03/18/2019  . Cyst of left ovary 09/07/2018  . Gastroesophageal reflux disease with esophagitis 06/04/2018  . Chronic  anemia 05/04/2018  . Hypokalemia 09/27/2017  . Elevated LFTs 09/27/2017  . Abnormal biliary HIDA scan 09/27/2017  . Marijuana abuse 09/27/2017  . Intractable nausea and vomiting 09/26/2017  . Cannabis hyperemesis syndrome concurrent with and due to cannabis abuse ([place]) 09/16/2017  . Dermoid 08/22/2017    Past Surgical History:  Procedure Laterality Date  . CESAREAN SECTION  '99 and '08   x 2  . CHOLECYSTECTOMY N/A 10/01/2017   Procedure: LAPAROSCOPIC CHOLECYSTECTOMY WITH POSSIBLE  INTRAOPERATIVE CHOLANGIOGRAM;  Surgeon: Stark Klein, MD;  Location: [facility];  Service: General;  Laterality: N/A;  . ESOPHAGOGASTRODUODENOSCOPY N/A 09/16/2017   Procedure: ESOPHAGOGASTRODUODENOSCOPY (EGD) WITH PROPOFOL;  Surgeon: Clarene Essex, MD;  Location: [facility];  Service: Endoscopy;  Laterality: N/A;  . LAPAROSCOPY Right 08/22/2017   Procedure: LAPAROSCOPY OPERATIVE removal of right ovary and right dermoid cyst;  Surgeon: Sloan Leiter, MD;  Location: [facility];  Service: Gynecology;  Laterality: Right;     OB History    Gravida  3   Para  3   Term      Preterm      AB      Living  3     SAB      TAB      Ectopic      Multiple      Live Births              Family History  Problem Relation Age of Onset  . Hypertension  Mother     Social History   Tobacco Use  . Smoking status: Former Smoker    Packs/day: 0.50  . Smokeless tobacco: Never Used  Vaping Use  . Vaping Use: Never used  Substance Use Topics  . Alcohol use: Not Currently  . Drug use: Yes    Types: Other-see comments, Marijuana    Comment: Occasionally smokes marijuana     Home Medications Prior to Admission medications   Medication Sig Start Date End Date Taking? Authorizing Provider  amitriptyline (ELAVIL) 100 MG tablet Take 100 mg by mouth at bedtime.  02/22/19  Yes [provider]  diphenhydrAMINE (BENADRYL) 25 mg capsule Take 25 mg by mouth every 6 (six) hours as needed for allergies or  sleep.   Yes [provider]  loperamide (IMODIUM A-D) 2 MG capsule Take 2 mg by mouth daily as needed.    Yes [provider]  mirtazapine (REMERON) 45 MG tablet Take 45 mg by mouth at bedtime as needed (sleep).  10/16/17  Yes [provider]  Ondansetron 4 MG FILM Dissolve 1 film as directed every 8 hours prn 03/17/19  Yes Azzie Glatter, FNP  simethicone (GAS-X) 80 MG chewable tablet Chew 80 mg by mouth daily as needed.    Yes [provider]  traZODone (DESYREL) 100 MG tablet Take 300 mg by mouth at bedtime.    Yes [provider]  famotidine (PEPCID) 20 MG tablet Take 1 tablet (20 mg total) by mouth 2 (two) times daily. 07/14/19   Tedd Sias, PA  haloperidol (HALDOL) 2 MG tablet Take 2 mg by mouth 2 (two) times daily.    [provider]  KLOR-CON M20 20 MEQ tablet TAKE 1 TABLET BY MOUTH EVERY DAY Patient not taking: Reported on 07/14/2019 12/28/18   Pat Patrick, MD  metoCLOPramide (REGLAN) 10 MG tablet Take 1 tablet (10 mg total) by mouth every 8 (eight) hours as needed for nausea or vomiting. 07/14/19   Tedd Sias, PA  ferrous sulfate 325 (65 FE) MG tablet Take 1 tablet (325 mg total) by mouth daily with breakfast. 05/05/18 08/23/18  Shelly Coss, MD  pantoprazole (PROTONIX) 40 MG tablet Take 1 tablet (40 mg total) by mouth daily. 04/24/18 08/23/18  Sherwood Gambler, MD  sucralfate (CARAFATE) 1 g tablet Take 1 tablet (1 g total) by mouth 4 (four) times daily -  with meals and at bedtime. 04/26/18 08/23/18  Julianne Rice, MD    Allergies    Penicillins and Strawberry (diagnostic)  Review of Systems   Review of Systems  Constitutional: Negative for chills and fever.  HENT: Negative for congestion.   Eyes: Negative for pain.  Respiratory: Negative for cough and shortness of breath.   Cardiovascular: Negative for chest pain and leg swelling.  Gastrointestinal: Positive for abdominal pain, nausea and vomiting. Negative  for diarrhea.  Genitourinary: Negative for dysuria.  Musculoskeletal: Negative for myalgias.  Skin: Negative for rash.  Neurological: Negative for dizziness and headaches.  Psychiatric/Behavioral: The patient is nervous/anxious.     Physical Exam Updated Vital Signs BP (!) 142/70   Pulse 93   Temp 98.3 F (36.8 C) (Oral)   Resp 16   SpO2 99%   Physical Exam Vitals and nursing note reviewed.  Constitutional:      Appearance: She is obese. She is not ill-appearing.     Comments: Patient is pleasant 44 year old female appears stated age somewhat anxious, clutching emesis bag with emesis  HENT:  Head: Normocephalic and atraumatic.     Nose: Nose normal.     Mouth/Throat:     Mouth: Mucous membranes are dry.  Eyes:     General: No scleral icterus. Cardiovascular:     Rate and Rhythm: Normal rate and regular rhythm.     Pulses: Normal pulses.     Heart sounds: Normal heart sounds.  Pulmonary:     Effort: Pulmonary effort is normal. No respiratory distress.     Breath sounds: No wheezing.  Abdominal:     Palpations: Abdomen is soft.     Tenderness: There is abdominal tenderness. There is no right CVA tenderness, left CVA tenderness, guarding or rebound.     Comments: Obese, soft abdomen with mild ttp of the epigastrium   Musculoskeletal:     Cervical back: Normal range of motion.     Right lower leg: No edema.     Left lower leg: No edema.  Skin:    General: Skin is warm and dry.     Capillary Refill: Capillary refill takes less than 2 seconds.  Neurological:     Mental Status: She is alert. Mental status is at baseline.  Psychiatric:     Comments: Patient appears quite anxious.  Is pacing in the room.      ED Results / Procedures / Treatments   Labs (all labs ordered are listed, but only abnormal results are displayed) Labs Reviewed  COMPREHENSIVE METABOLIC PANEL - Abnormal; Notable for the following components:      Result Value   CO2 18 (*)    Glucose, Bld  164 (*)    Creatinine, Ser 1.01 (*)    Total Protein 8.3 (*)    All other components within normal limits  CBC - Abnormal; Notable for the following components:   WBC 20.7 (*)    Hemoglobin 10.3 (*)    HCT 33.9 (*)    MCV 74.8 (*)    MCH 22.7 (*)    RDW 17.4 (*)    All other components within normal limits  URINALYSIS, ROUTINE W REFLEX MICROSCOPIC - Abnormal; Notable for the following components:   Color, Urine AMBER (*)    Specific Gravity, Urine 1.031 (*)    Ketones, ur 80 (*)    Protein, ur 100 (*)    Leukocytes,Ua SMALL (*)    Bacteria, UA RARE (*)    All other components within normal limits  RAPID URINE DRUG SCREEN, HOSP PERFORMED - Abnormal; Notable for the following components:   Tetrahydrocannabinol POSITIVE (*)    All other components within normal limits  LIPASE, BLOOD  I-STAT BETA HCG BLOOD, ED ([facility], [facility], [facility] ONLY)    EKG None  Radiology No results found.  Procedures Procedures (including critical care time)  Medications Ordered in ED Medications  amitriptyline (ELAVIL) tablet 100 mg (has no administration in time range)  sodium chloride flush (NS) 0.9 % injection 3 mL (3 mLs Intravenous Given 07/14/19 0800)  sodium chloride 0.9 % bolus 1,000 mL (0 mLs Intravenous Stopped 07/14/19 1008)  diphenhydrAMINE (BENADRYL) injection 25 mg (25 mg Intravenous Given 07/14/19 0759)  famotidine (PEPCID) IVPB 20 mg premix ( Intravenous Stopped 07/14/19 0838)  capsaicin (ZOSTRIX) 0.025 % cream ( Topical Given 07/14/19 0806)  droperidol (INAPSINE) 2.5 MG/ML injection 2.5 mg (2.5 mg Intravenous Given 07/14/19 0759)  LORazepam (ATIVAN) injection 1 mg (1 mg Intravenous Given 07/14/19 7654)    ED Course  I have reviewed the triage vital signs and the nursing notes.  Pertinent labs & imaging results that were available during my care of the patient were reviewed by me and considered in my medical decision making (see chart for details).  Patient is 44 year old female with a history of  cannabinoid hyperemesis, intractable nausea and vomiting, hypokalemia, chronic marijuana use, delayed gastric emptying following with gastroenterology presents today for 3 days of nausea vomiting and inability to tolerate p.o. and epigastric abdominal pain.  Patient states that she has had no fevers, no diarrhea no urinary symptoms or vaginal discharge.  Doubt increased ICP or acute intra-abdominal pathology as cause of patient's nausea vomiting today.  Suspect that this is related to her chronic cavities.  She states that she last used 2 days ago while she was having episodes of nausea and vomiting.  She states she also use this prior to her nausea and vomiting beginning.  She does however state that smoking marijuana seems to help with her symptoms.  This also may be related to gastroparesis/delayed gastric emptying.  She is taken no medications prior to arrival for nausea other than Benadryl.  Physical exam is notable for mild epigastric tenderness with deep palpation.  No other significant findings.  Patient does appear acutely dehydrated with dry oral mucosa.  Right lower quadrant or lower abdominal tenderness.  Doubt ectopic pregnancy, ovarian torsion, appendicitis.  hCG negative pregnancy.  Clinical Course as of Jul 13 1032  Wed Jul 14, 2019  0834 CMP with no significant worsening abnormalities. Glucose is mildly elevated. CO2 mildly low likely secondary to hyperventilation patient is consistently breathing quickly when she is anxious. CBC with significant leukocytosis of 20 however on my review of EMR it appears that this is frequently elevated. She has no fever or infectious symptoms other than the nausea and vomiting abdominal pain. I doubt acute abdominal infection. Hemoglobin is chronically low. Unchanged today.Lipase within normal limits.pancreatitis. Urinalysis does not appear acutely infected with rare bacteria and small leukocytes present protein and ketones present likely secondary  to dehydration as she does have an elevated specific gravity as well.  Urine drug screen positive for THC. Consistent with her history.  Comprehensive metabolic panel(!) [WF]  9833 Patient reassessed after droperidol, capsaicin and Benadryl. She is no longer vomiting. I personally p.o. challenge patient and she was able to tolerate fluids. Patient given her daily amkytripiline. She states that her medications will be in the mail tomorrow. She denies any further symptoms states she no longer has any abdominal pain.   [WF]  1027 Patient was given Ativan and reassessed at this time.  She seems to be improved.  Also less anxious.  I personally p.o. challenge patient and she seems to be doing significantly better than before.  She has received 1 L normal saline.  She is no longer tachycardic.  Will provide patient with her prescribed amitriptyline and discharged with Reglan and Benadryl.   [WF]    Clinical Course User Index [WF] Tedd Sias, PA   MDM Rules/Calculators/A&P                          Plan is to provide patient with fluids, droperidol, Benadryl, capsaicin administration to the abdomen.  Will fluid challenge and recheck vital signs after intervention.  Suspect her mild tachycardia is secondary to dehydration and anxiety.  Patient received droperidol and has significant improvement however I did have another episode of emesis that required Ativan.  She has a history of normal QT no indication for  EKG currently.  Patient is tolerating p.o. at this time.  Will provide her with her daily amitriptyline and discharged with Reglan Benadryl.  I discussed this case with my attending physician who cosigned this note including patient's presenting symptoms, physical exam, and planned diagnostics and interventions. Attending physician stated agreement with plan or made changes to plan which were implemented.    Final Clinical Impression(s) / ED Diagnoses Final diagnoses:  Non-intractable  vomiting with nausea, unspecified vomiting type    Rx / DC Orders ED Discharge Orders         Ordered    metoCLOPramide (REGLAN) 10 MG tablet  Every 8 hours PRN     Discontinue  Reprint     07/14/19 0904    famotidine (PEPCID) 20 MG tablet  2 times daily     Discontinue  Reprint     07/14/19 0904           Tedd Sias, PA 07/14/19 1034    Isla Pence, MD 07/14/19 1347

## 2019-07-14 NOTE — Discharge Instructions (Addendum)
Please follow-up with your primary care doctor and gastroenterologist.  I have prescribed you a medication called Reglan.  This will help with your nausea.  It also may help with your delayed stomach emptying.  I have also prescribed you Pepcid which I would recommend that you take once per day as well.  Please drink plenty of water and take your medications as prescribed.  Please refrain from smoking marijuana.

## 2019-08-10 DIAGNOSIS — F3181 Bipolar II disorder: Secondary | ICD-10-CM | POA: Diagnosis not present

## 2019-09-07 ENCOUNTER — Other Ambulatory Visit: Payer: Self-pay

## 2019-09-07 ENCOUNTER — Other Ambulatory Visit: Payer: Medicare Other

## 2019-09-07 DIAGNOSIS — Z20822 Contact with and (suspected) exposure to covid-19: Secondary | ICD-10-CM

## 2019-09-08 LAB — NOVEL CORONAVIRUS, NAA: SARS-CoV-2, NAA: NOT DETECTED

## 2019-09-17 ENCOUNTER — Ambulatory Visit: Payer: Medicare Other | Admitting: Family Medicine

## 2019-09-22 IMAGING — CR DG ABDOMEN ACUTE W/ 1V CHEST
4 series · 4 of 4 positions shown · non-contrast
Comparison: CT abdomen/pelvis 05/12/2018.

CLINICAL DATA: Worsening mid abdominal pain with nausea vomiting.

EXAM:
DG ABDOMEN ACUTE W/ 1V CHEST

[w chest pa]
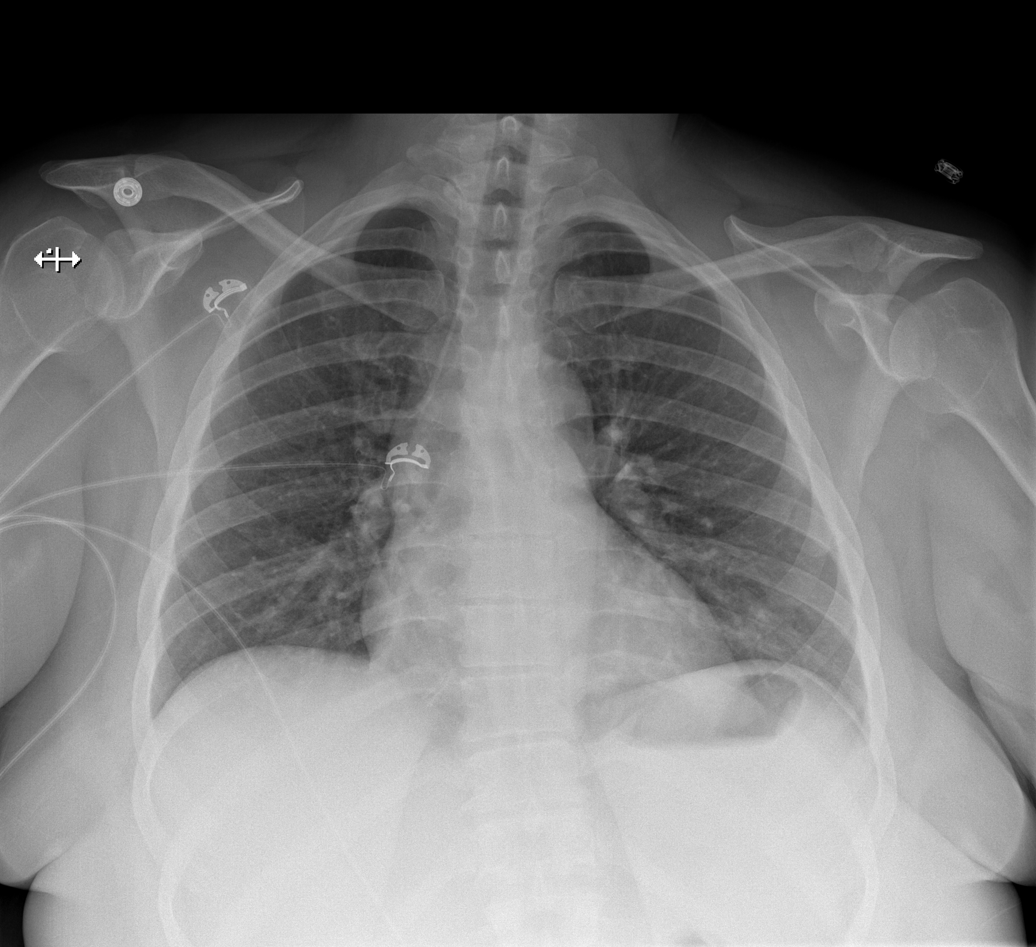

[w abdomen upright]
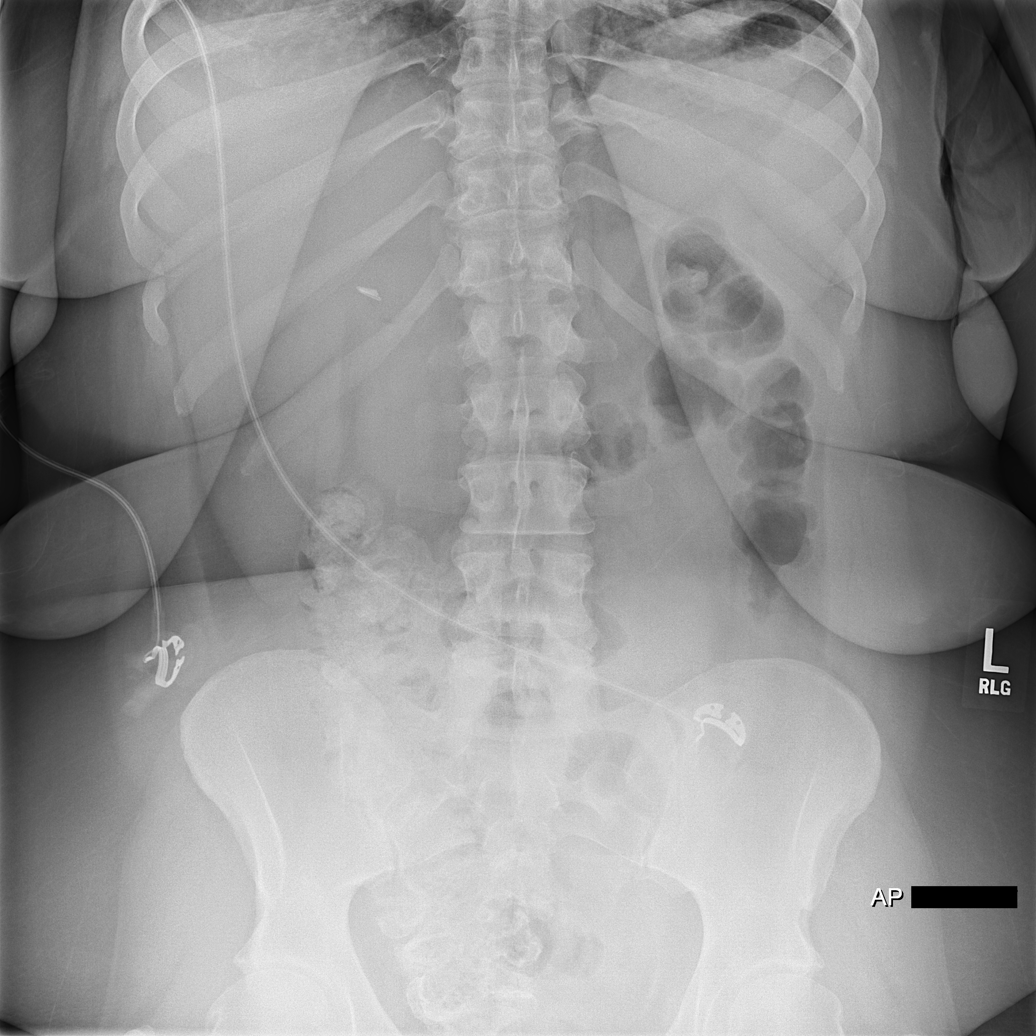

[t abdomen supine (1 of 2)]
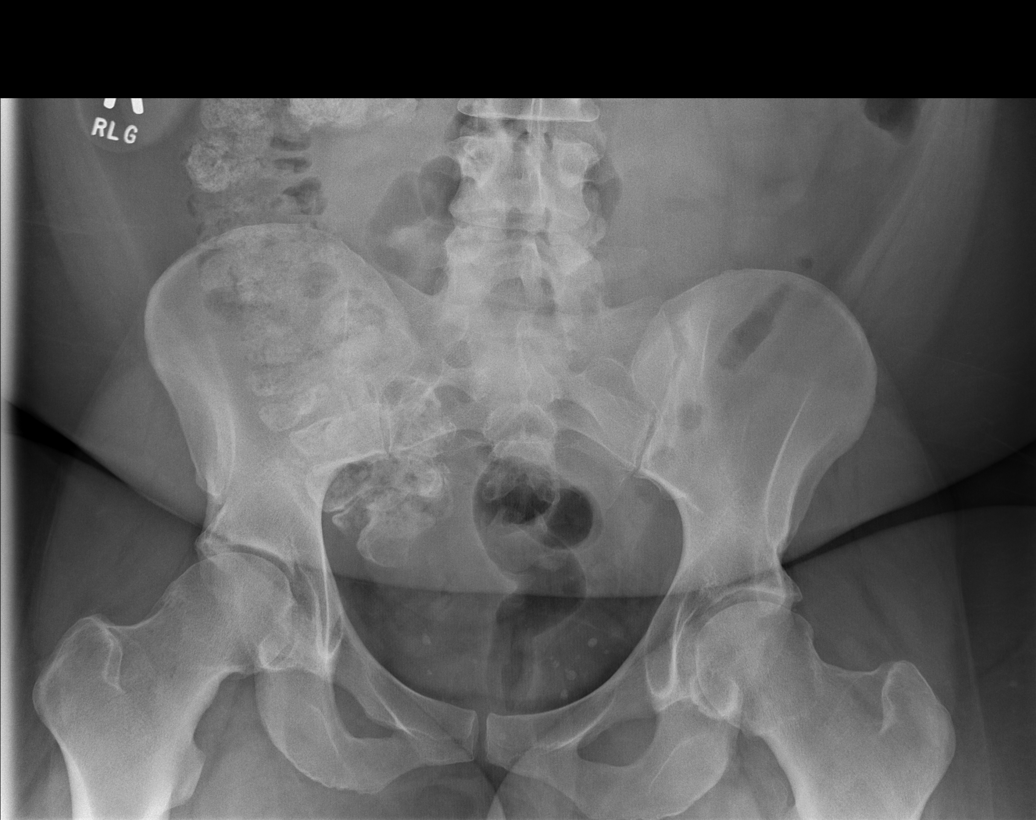

[t abdomen supine (2 of 2)]
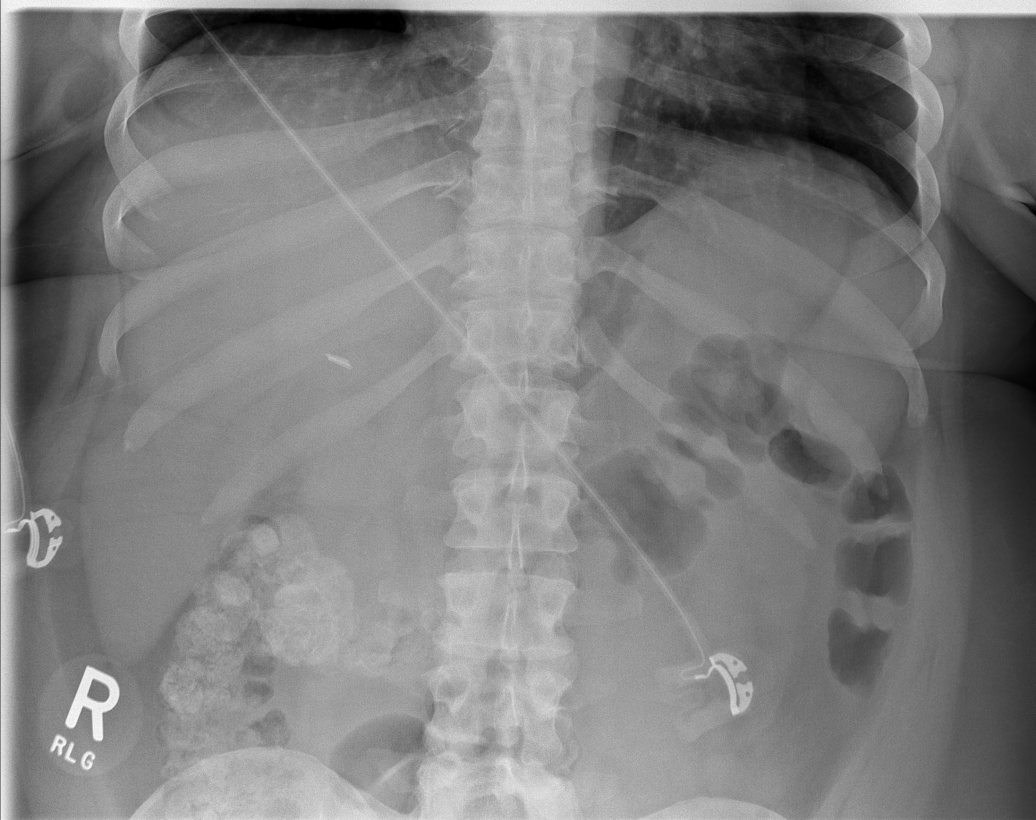

[4 of 4 positions shown; findings below may reference images not displayed]

FINDINGS: The lungs are clear without focal pneumonia, edema, pneumothorax or
pleural effusion. The cardiopericardial silhouette is within normal
limits for size. The visualized bony structures of the thorax are
intact. Telemetry leads overlie the chest.

Upright film shows no evidence for intraperitoneal free air. There
is no evidence for gaseous bowel dilation to suggest obstruction.
Increased density of contents in the right colon may be from
contrast study or ingested material. Multiple phleboliths noted over
the lower pelvis. Visualized bony anatomy unremarkable.
IMPRESSION: 1. No acute cardiopulmonary findings.
2. No evidence for bowel perforation or obstruction.

## 2019-10-11 ENCOUNTER — Encounter (HOSPITAL_COMMUNITY): Payer: Self-pay

## 2019-10-11 ENCOUNTER — Ambulatory Visit (INDEPENDENT_AMBULATORY_CARE_PROVIDER_SITE_OTHER): Payer: Medicare Other

## 2019-10-11 ENCOUNTER — Other Ambulatory Visit: Payer: Self-pay

## 2019-10-11 ENCOUNTER — Ambulatory Visit (HOSPITAL_COMMUNITY)
Admission: EM | Admit: 2019-10-11 | Discharge: 2019-10-11 | Disposition: A | Discharge status: Home / Self Care | Payer: Medicare Other | Attending: Urgent Care | Admitting: Urgent Care

## 2019-10-11 DIAGNOSIS — M25551 Pain in right hip: Secondary | ICD-10-CM

## 2019-10-11 DIAGNOSIS — M1611 Unilateral primary osteoarthritis, right hip: Secondary | ICD-10-CM | POA: Diagnosis not present

## 2019-10-11 DIAGNOSIS — M1610 Unilateral primary osteoarthritis, unspecified hip: Secondary | ICD-10-CM | POA: Diagnosis not present

## 2019-10-11 DIAGNOSIS — R1031 Right lower quadrant pain: Secondary | ICD-10-CM

## 2019-10-11 MED ORDER — PREDNISONE 20 MG PO TABS
ORAL_TABLET | ORAL | 0 refills | Status: DC
Start: 2019-10-11 — End: 2019-11-10

## 2019-10-11 MED ORDER — KETOROLAC TROMETHAMINE 60 MG/2ML IM SOLN: 60.0000 mg | Freq: Once | INTRAMUSCULAR | Status: DC

## 2019-10-11 MED ORDER — PROMETHAZINE HCL 25 MG RE SUPP
25.0000 mg | Freq: Four times a day (QID) | RECTAL | 0 refills | Status: DC | PRN
Start: 2019-10-11 — End: 2019-11-10

## 2019-10-11 NOTE — ED Provider Notes (Signed)
[facility]   MRN: [id number] DOB: 1975-07-25  Subjective:   Haley Freeman is a 44 y.o. female presenting for 1 month history of persistent right groin pain that radiates internally.  She has also had some numbness and tingling along with the side of her right thigh.  Denies any particular falls or trauma.  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  Has had to do a lot of walking and standing.  Has had significant difficulty doing her job due to her pain.  Is unable to take any medications orally as she states she has cyclic vomiting syndrome in gets sick really easily.  No current facility-administered medications for this encounter.  Current Outpatient Medications:    amitriptyline (ELAVIL) 100 MG tablet, Take 100 mg by mouth at bedtime. , Disp: , Rfl:    diphenhydrAMINE (BENADRYL) 25 mg capsule, Take 25 mg by mouth every 6 (six) hours as needed for allergies or sleep., Disp: , Rfl:    famotidine (PEPCID) 20 MG tablet, Take 1 tablet (20 mg total) by mouth 2 (two) times daily., Disp: 30 tablet, Rfl: 0   haloperidol (HALDOL) 2 MG tablet, Take 2 mg by mouth 2 (two) times daily., Disp: , Rfl:    KLOR-CON M20 20 MEQ tablet, TAKE 1 TABLET BY MOUTH EVERY DAY (Patient not taking: Reported on 07/14/2019), Disp: 90 tablet, Rfl: 1   loperamide (IMODIUM A-D) 2 MG capsule, Take 2 mg by mouth daily as needed. , Disp: , Rfl:    metoCLOPramide (REGLAN) 10 MG tablet, Take 1 tablet (10 mg total) by mouth every 8 (eight) hours as needed for nausea or vomiting., Disp: 30 tablet, Rfl: 0   mirtazapine (REMERON) 45 MG tablet, Take 45 mg by mouth at bedtime as needed (sleep). , Disp: , Rfl: 2   Ondansetron 4 MG FILM, Dissolve 1 film as directed every 8 hours prn, Disp: 30 each, Rfl: 2   simethicone (GAS-X) 80 MG chewable tablet, Chew 80 mg by mouth daily as needed. , Disp: , Rfl:    traZODone (DESYREL) 100 MG tablet, Take 300 mg by mouth at bedtime. , Disp: , Rfl:     Allergies  Allergen Reactions   Penicillins Anaphylaxis    Swells throat. Has patient had a PCN reaction causing immediate rash, facial/tongue/throat swelling, SOB or lightheadedness with hypotension: Yes Has patient had a PCN reaction causing severe rash involving mucus membranes or skin necrosis: No Has patient had a PCN reaction that required hospitalization: Yes Has patient had a PCN reaction occurring within the last 10 years: No If all of the above answers are "NO", then may proceed with Cephalosporin use.    Strawberry (Diagnostic) Shortness Of Breath    Past Medical History:  Diagnosis Date   Alcohol abuse    Anxiety    Chronic anemia 05/04/2018   Depression    Headache(784.0)    History of cholecystectomy 2019   Hypokalemia    Neuromuscular disorder ([place])    S/P removal of right ovary 08/2017     Past Surgical History:  Procedure Laterality Date   CESAREAN SECTION  '99 and '08   x 2   CHOLECYSTECTOMY N/A 10/01/2017   Procedure: LAPAROSCOPIC CHOLECYSTECTOMY WITH POSSIBLE  INTRAOPERATIVE CHOLANGIOGRAM;  Surgeon: Stark Klein, MD;  Location[facility];  Service: General;  Laterality: N/A;   ESOPHAGOGASTRODUODENOSCOPY N/A 09/16/2017   Procedure: ESOPHAGOGASTRODUODENOSCOPY (EGD) WITH PROPOFOL;  Surgeon: Clarene Essex, MD;  Location: [facility];  Service: Endoscopy;  Laterality: N/A;   LAPAROSCOPY Right 08/22/2017   Procedure: LAPAROSCOPY OPERATIVE removal of right ovary and right dermoid cyst;  Surgeon: Sloan Leiter, MD;  Location: [facility];  Service: Gynecology;  Laterality: Right;    Family History  Problem Relation Age of Onset   Hypertension Mother     Social History   Tobacco Use   Smoking status: Former Smoker    Packs/day: 0.50   Smokeless tobacco: Never Used  Scientific laboratory technician Use: Never used  Substance Use Topics   Alcohol use: Not Currently   Drug use: Yes    Types: Other-see comments, Marijuana    Comment: Occasionally smokes  marijuana     ROS   Objective:   Vitals: BP 138/85 (BP Location: Left Arm)    Pulse 94    Temp 98.3 F (36.8 C) (Oral)    Resp 18    LMP 10/01/2019    SpO2 99%   Physical Exam Constitutional:      General: She is not in acute distress.    Appearance: Normal appearance. She is well-developed. She is obese. She is not ill-appearing, toxic-appearing or diaphoretic.  HENT:     Head: Normocephalic and atraumatic.     Nose: Nose normal.     Mouth/Throat:     Mouth: Mucous membranes are moist.     Pharynx: Oropharynx is clear.  Eyes:     General: No scleral icterus.       Right eye: No discharge.        Left eye: No discharge.     Extraocular Movements: Extraocular movements intact.     Conjunctiva/sclera: Conjunctivae normal.     Pupils: Pupils are equal, round, and reactive to light.  Cardiovascular:     Rate and Rhythm: Normal rate.  Pulmonary:     Effort: Pulmonary effort is normal.  Abdominal:     Hernia: There is no hernia in the right inguinal area.  Genitourinary:    Pubic Area: No rash.      Labia:        Right: No rash, tenderness, lesion or injury.      Comments: RN Melissa assisted as a chaperone. Musculoskeletal:     Right hip: Tenderness present. No deformity, lacerations, bony tenderness or crepitus. Decreased range of motion. Normal strength.     Right upper leg: No swelling, edema, deformity, lacerations, tenderness or bony tenderness.  Lymphadenopathy:     Lower Body: No right inguinal adenopathy.  Skin:    General: Skin is warm and dry.     Findings: No rash.  Neurological:     General: No focal deficit present.     Mental Status: She is alert and oriented to person, place, and time.     Motor: No weakness.     Gait: Gait normal.  Psychiatric:        Mood and Affect: Mood normal.        Behavior: Behavior normal.        Thought Content: Thought content normal.        Judgment: Judgment normal.     DG Hip Unilat W or Wo Pelvis 2-3 Views  Right  Result Date: 10/11/2019 CLINICAL DATA:  Right hip pain after fall 3 weeks ago EXAM: DG HIP (WITH OR WITHOUT PELVIS) 2-3V RIGHT COMPARISON:  08/27/2018 FINDINGS: No acute fracture. No dislocation. Moderate degenerative changes of the right hip joint manifested by joint space narrowing and marginal osteophyte formation.  No bone lesion identified. IMPRESSION: Moderate degenerative changes of the right hip. No acute osseous findings. Electronically Signed   By: Davina Poke D.O.   On: 10/11/2019 09:17    Assessment and Plan :   PDMP not reviewed this encounter.  1. Hip arthritis   2. Right groin pain     Patient found to have moderate degenerative changes of the right hip which I suspect is the source of her severe pain likely worsened by her recent excessive walking.  Patient was agreeable to a trial of prednisone.  Offered promethazine suppositories to help with her nausea and vomiting.  Contact an orthopedist, information provided to the patient. Counseled patient on potential for adverse effects with medications prescribed/recommended today, ER and return-to-clinic precautions discussed, patient verbalized understanding.    Jaynee Eagles, [place] 10/11/19 [phone]

## 2019-10-11 NOTE — ED Triage Notes (Signed)
Pt presents with right leg pain and swelling X 4 weeks: pt also complains of numbness & tingling in right thigh.

## 2019-10-13 DIAGNOSIS — M5459 Other low back pain: Secondary | ICD-10-CM | POA: Diagnosis not present

## 2019-11-01 ENCOUNTER — Encounter (INDEPENDENT_AMBULATORY_CARE_PROVIDER_SITE_OTHER): Payer: Self-pay

## 2019-11-03 DIAGNOSIS — M545 Low back pain, unspecified: Secondary | ICD-10-CM | POA: Diagnosis not present

## 2019-11-08 DIAGNOSIS — M25551 Pain in right hip: Secondary | ICD-10-CM | POA: Diagnosis not present

## 2019-11-10 ENCOUNTER — Ambulatory Visit (INDEPENDENT_AMBULATORY_CARE_PROVIDER_SITE_OTHER): Payer: Medicare Other | Admitting: Bariatrics

## 2019-11-10 ENCOUNTER — Encounter (INDEPENDENT_AMBULATORY_CARE_PROVIDER_SITE_OTHER): Payer: Self-pay | Admitting: Bariatrics

## 2019-11-10 ENCOUNTER — Other Ambulatory Visit: Payer: Self-pay

## 2019-11-10 VITALS — BP 123/83 | HR 91 | Temp 98.1°F | Ht 68.0 in | Wt 274.0 lb

## 2019-11-10 DIAGNOSIS — E559 Vitamin D deficiency, unspecified: Secondary | ICD-10-CM

## 2019-11-10 DIAGNOSIS — Z862 Personal history of diseases of the blood and blood-forming organs and certain disorders involving the immune mechanism: Secondary | ICD-10-CM | POA: Diagnosis not present

## 2019-11-10 DIAGNOSIS — Z6841 Body Mass Index (BMI) 40.0 and over, adult: Secondary | ICD-10-CM

## 2019-11-10 DIAGNOSIS — R7309 Other abnormal glucose: Secondary | ICD-10-CM | POA: Diagnosis not present

## 2019-11-10 DIAGNOSIS — R0602 Shortness of breath: Secondary | ICD-10-CM

## 2019-11-10 DIAGNOSIS — Z1331 Encounter for screening for depression: Secondary | ICD-10-CM

## 2019-11-10 DIAGNOSIS — R5383 Other fatigue: Secondary | ICD-10-CM

## 2019-11-10 DIAGNOSIS — Z0289 Encounter for other administrative examinations: Secondary | ICD-10-CM

## 2019-11-10 DIAGNOSIS — M549 Dorsalgia, unspecified: Secondary | ICD-10-CM | POA: Diagnosis not present

## 2019-11-10 NOTE — Progress Notes (Signed)
Dear Dr. Edmonia Lynch,   Thank you for referring Haley Freeman to our clinic. The following note includes my evaluation and treatment recommendations.  Chief Complaint:   OBESITY Haley Freeman (MR# [id number]) is a 44 y.o. female who presents for evaluation and treatment of obesity and related comorbidities. Current BMI is Body mass index is 41.66 kg/m.Marland Kitchen Haley Freeman has been struggling with her weight for many years and has been unsuccessful in either losing weight, maintaining weight loss, or reaching her healthy weight goal.  Haley Freeman is currently in the action stage of change and ready to dedicate time achieving and maintaining a healthier weight. Haley Freeman is interested in becoming our patient and working on intensive lifestyle modifications including (but not limited to) diet and exercise for weight loss.  Haley Freeman does not usually like to cook. She states that she is not a picky eater. She tends to skip breakfast.  Haley Freeman's habits were reviewed today and are as follows: Her family eats meals together, she thinks her family will eat healthier with her, her desired weight loss is 99 lbs, she has been heavy most of her life, she started gaining weight at about the age of 45, her heaviest weight ever was 325 pounds, she is a picky eater and doesn't like to eat healthier foods, she snacks frequently in the evenings, she wakes up frequently in the middle of the night to eat, she skips breakfast and sometimes lunch, she frequently eats larger portions than normal, she has binge eating behaviors and she struggles with emotional eating.  Depression Screen Haley Freeman's Food and Mood (modified PHQ-9) score was 16.  Depression screen PHQ 2/9 11/10/2019  Decreased Interest 2  Down, Depressed, Hopeless 2  PHQ - 2 Score 4  Altered sleeping 3  Tired, decreased energy 3  Change in appetite 1  Feeling bad or failure about yourself  1  Trouble concentrating 1  Moving slowly or fidgety/restless 3  Suicidal  thoughts 0  PHQ-9 Score 16  Difficult doing work/chores Extremely dIfficult   Subjective:   Other fatigue. Haley Freeman denies daytime somnolence and denies waking up still tired. Haley Freeman generally gets 6 hours of sleep per night, and states that she sometimes has restful sleep. Snoring is present. Apneic episodes are not present. Epworth Sleepiness Score is 2.  SOB (shortness of breath) on exertion. Haley Freeman notes increasing shortness of breath with exercising and seems to be worsening over time with weight gain. She notes getting out of breath sooner with activity than she used to. This has gotten worse recently. Haley Freeman denies shortness of breath at rest or orthopnea.  Back pain, unspecified back location, unspecified back pain laterality, unspecified chronicity. Haley Freeman uses treadmill for exercise.  Vitamin D deficiency. Haley Freeman takes Vitamin D occasionally.   Ref. Range 06/03/2018 14:01  Vitamin D, 25-Hydroxy Latest Ref Range: 30.0 - 100.0 ng/mL 21.3 (L)   History of iron deficiency anemia. Haley Freeman is on no vitamin supplementation.   CBC Latest Ref Rng & Units 07/14/2019 08/27/2018 05/29/2018  WBC 4.0 - 10.5 K/uL 20.7(H) 17.9(H) 11.3(H)  Hemoglobin 12.0 - 15.0 g/dL 10.3(L) 11.0(L) 10.9(L)  Hematocrit 36 - 46 % 33.9(L) 37.4 36.7  Platelets 150 - 400 K/uL 343 225 296   Lab Results  Component Value Date   IRON 16 (L) 05/04/2018   TIBC 482 (H) 05/04/2018   FERRITIN 6 (L) 05/04/2018   Lab Results  Component Value Date   VITAMINB12 504 06/03/2018   Elevated glucose. Haley Freeman denies polyphagia.  Depression  screening. Haley Freeman had a strongly positive depression screen with a PHQ-9 score of 16.  Assessment/Plan:   Other fatigue. Haley Freeman does feel that her weight is causing her energy to be lower than it should be. Fatigue may be related to obesity, depression or many other causes. Labs will be ordered, and in the meanwhile, Haley Freeman will focus on self care including making healthy food choices,  increasing physical activity and focusing on stress reduction. EKG 12-Lead, CBC with Differential/Platelet, Comprehensive metabolic panel, Hemoglobin A1c, Insulin, random, T3, T4, free, TSH, VITAMIN D 25 Hydroxy (Vit-D Deficiency, Fractures) testing ordered today.  SOB (shortness of breath) on exertion. Haley Freeman does feel that she gets out of breath more easily that she used to when she exercises. Haley Freeman's shortness of breath appears to be obesity related and exercise induced. She has agreed to work on weight loss and gradually increase exercise to treat her exercise induced shortness of breath. Will continue to monitor closely.  Back pain, unspecified back location, unspecified back pain laterality, unspecified chronicity. Haley Freeman will gradually increase exercise and was advised to avoid pounding exercises.  Vitamin D deficiency. Low Vitamin D level contributes to fatigue and are associated with obesity, breast, and colon cancer. VITAMIN D 25 Hydroxy (Vit-D Deficiency, Fractures) level will be checked today.   History of iron deficiency anemia. Orders and follow up as documented in patient record. CBC with Differential/Platelet, Ferritin labs will be checked today.   Counseling . Iron is essential for our bodies to make red blood cells.  Reasons that someone may be deficient include: an iron-deficient diet (more likely in those following vegan or vegetarian diets), women with heavy menses, patients with GI disorders or poor absorption, patients that have had bariatric surgery, frequent blood donors, patients with cancer, and patients with heart disease.   Marland Kitchen An iron supplement has been recommended. This is found over-the-counter.  Haley Freeman foods include dark leafy greens, red and white meats, eggs, seafood, and beans.   . Certain foods and drinks prevent your body from absorbing iron properly. Avoid eating these foods in the same meal as iron-rich foods or with iron supplements. These foods include:  coffee, black tea, and red wine; milk, dairy products, and foods that are high in calcium; beans and soybeans; whole grains.  . Constipation can be a side effect of iron supplementation. Increased water and fiber intake are helpful. Water goal: > 2 liters/day. Fiber goal: > 25 grams/day.   Elevated glucose.  Hemoglobin A1c, Insulin, random levels will be checked today.  Depression screening. Alaiza had a positive depression screening. Depression is commonly associated with obesity and often results in emotional eating behaviors. We will monitor this closely and work on CBT to help improve the non-hunger eating patterns. Referral to Psychology may be required if no improvement is seen as she continues in our clinic.  Class 3 severe obesity with serious comorbidity and body mass index (BMI) of 40.0 to 44.9 in adult, unspecified obesity type ([place]).  Haley Freeman is currently in the action stage of change and her goal is to continue with weight loss efforts. I recommend Haley Freeman begin the structured treatment plan as follows:  She has agreed to the Category 3 Plan.  She will work on meal planning and intentional eating.   Exercise goals: All adults should avoid inactivity. Some physical activity is better than none, and adults who participate in any amount of physical activity gain some health benefits.   Behavioral modification strategies: increasing lean protein intake, decreasing  simple carbohydrates, increasing vegetables, increasing water intake, decreasing eating out, no skipping meals, meal planning and cooking strategies, keeping healthy foods in the home, better snacking choices, emotional eating strategies and planning for success.  She was informed of the importance of frequent follow-up visits to maximize her success with intensive lifestyle modifications for her multiple health conditions. She was informed we would discuss her lab results at her next visit unless there is a critical issue that  needs to be addressed sooner. Haley Freeman agreed to keep her next visit at the agreed upon time to discuss these results.  Objective:   Blood pressure 123/83, pulse 91, temperature 98.1 F (36.7 C), temperature source Oral, height 5\' 8"  (1.727 m), weight 274 lb (124.3 kg), last menstrual period 10/25/2019, SpO2 98 %. Body mass index is 41.66 kg/m.  EKG: Sinus  Rhythm with a rate of 89 BPM. Left atrial enlargement. Borderline.  Indirect Calorimeter completed today shows a VO2 of 326 and a REE of 2266.  Her calculated basal metabolic rate is 1610 thus her basal metabolic rate is better than expected.  General: Cooperative, alert, well developed, in no acute distress. HEENT: Conjunctivae and lids unremarkable. Cardiovascular: Regular rhythm.  Lungs: Normal work of breathing. Neurologic: No focal deficits.   Lab Results  Component Value Date   CREATININE 1.01 (H) 07/14/2019   BUN 14 07/14/2019   NA 139 07/14/2019   K 3.6 07/14/2019   CL 106 07/14/2019   CO2 18 (L) 07/14/2019   Lab Results  Component Value Date   ALT 16 07/14/2019   AST 25 07/14/2019   ALKPHOS 96 07/14/2019   BILITOT 0.4 07/14/2019   Lab Results  Component Value Date   HGBA1C 5.5 03/16/2019   HGBA1C 6.3 (H) 10/29/2017   No results found for: INSULIN Lab Results  Component Value Date   TSH 0.895 06/03/2018   Lab Results  Component Value Date   CHOL 183 06/03/2018   HDL 42 06/03/2018   LDLCALC 103 (H) 06/03/2018   TRIG 188 (H) 06/03/2018   CHOLHDL 4.4 06/03/2018   Lab Results  Component Value Date   WBC 20.7 (H) 07/14/2019   HGB 10.3 (L) 07/14/2019   HCT 33.9 (L) 07/14/2019   MCV 74.8 (L) 07/14/2019   PLT 343 07/14/2019   Lab Results  Component Value Date   IRON 16 (L) 05/04/2018   TIBC 482 (H) 05/04/2018   FERRITIN 6 (L) 05/04/2018   Obesity Behavioral Intervention:   Approximately 15 minutes were spent on the discussion below.  ASK: We discussed the diagnosis of obesity with Haley Freeman today  and Haley Freeman agreed to give [name] permission to discuss obesity behavioral modification therapy today.  ASSESS: Haley Freeman has the diagnosis of obesity and her BMI today is 41.7. Haley Freeman is in the action stage of change.   ADVISE: Haley Freeman was educated on the multiple health risks of obesity as well as the benefit of weight loss to improve her health. She was advised of the need for long term treatment and the importance of lifestyle modifications to improve her current health and to decrease her risk of future health problems.  AGREE: Multiple dietary modification options and treatment options were discussed and Haley Freeman agreed to follow the recommendations documented in the above note.  ARRANGE: Haley Freeman was educated on the importance of frequent visits to treat obesity as outlined per CMS and USPSTF guidelines and agreed to schedule her next follow up appointment today.  Attestation Statements:   Reviewed by Pension scheme manager on  day of visit: allergies, medications, problem list, medical history, surgical history, family history, social history, and previous encounter notes.  Migdalia Dk, am acting as Location manager for [employer], DO   I have reviewed the above documentation for accuracy and completeness, and I agree with the above. Jearld Lesch, DO

## 2019-11-24 ENCOUNTER — Ambulatory Visit (INDEPENDENT_AMBULATORY_CARE_PROVIDER_SITE_OTHER): Payer: Self-pay | Admitting: Bariatrics

## 2019-11-24 DIAGNOSIS — M25551 Pain in right hip: Secondary | ICD-10-CM | POA: Diagnosis not present

## 2019-11-30 ENCOUNTER — Ambulatory Visit: Payer: Self-pay | Admitting: Family Medicine

## 2020-01-21 ENCOUNTER — Other Ambulatory Visit: Payer: Medicaid Other

## 2020-01-21 DIAGNOSIS — Z20822 Contact with and (suspected) exposure to covid-19: Secondary | ICD-10-CM | POA: Diagnosis not present

## 2020-01-23 LAB — SARS-COV-2, NAA 2 DAY TAT

## 2020-01-23 LAB — NOVEL CORONAVIRUS, NAA: SARS-CoV-2, NAA: NOT DETECTED

## 2020-03-10 DIAGNOSIS — R1115 Cyclical vomiting syndrome unrelated to migraine: Secondary | ICD-10-CM | POA: Diagnosis not present

## 2020-04-05 ENCOUNTER — Ambulatory Visit: Payer: Medicare Other | Admitting: Family Medicine

## 2020-04-10 ENCOUNTER — Other Ambulatory Visit: Payer: Self-pay

## 2020-04-10 ENCOUNTER — Encounter: Payer: Self-pay | Admitting: Family Medicine

## 2020-04-10 ENCOUNTER — Ambulatory Visit (INDEPENDENT_AMBULATORY_CARE_PROVIDER_SITE_OTHER): Payer: Medicare Other | Admitting: Family Medicine

## 2020-04-10 VITALS — BP 117/76 | HR 101 | Ht 68.0 in | Wt 277.0 lb

## 2020-04-10 DIAGNOSIS — Z0000 Encounter for general adult medical examination without abnormal findings: Secondary | ICD-10-CM

## 2020-04-10 DIAGNOSIS — D649 Anemia, unspecified: Secondary | ICD-10-CM

## 2020-04-10 DIAGNOSIS — Z09 Encounter for follow-up examination after completed treatment for conditions other than malignant neoplasm: Secondary | ICD-10-CM | POA: Diagnosis not present

## 2020-04-10 DIAGNOSIS — F319 Bipolar disorder, unspecified: Secondary | ICD-10-CM

## 2020-04-10 DIAGNOSIS — R112 Nausea with vomiting, unspecified: Secondary | ICD-10-CM

## 2020-04-10 MED ORDER — ONDANSETRON HCL 4 MG PO TABS: 4.0000 mg | ORAL_TABLET | Freq: Three times a day (TID) | ORAL | 0 refills | Status: DC | PRN

## 2020-04-10 NOTE — Patient Instructions (Addendum)
Nausea, Adult Nausea is feeling sick to your stomach or feeling that you are about to throw up (vomit). Feeling sick to your stomach is usually not serious, but it may be an early sign of a more serious medical problem. As you feel sicker to your stomach, you may throw up. If you throw up, or if you are not able to drink enough fluids, there is a risk that you may lose too much water in your body (get dehydrated). If you lose too much water in your body, you may:  Feel tired.  Feel thirsty.  Have a dry mouth.  Have cracked lips.  Go pee (urinate) less often. Older adults and people who have other diseases or a weak body defense system (immune system) have a higher risk of losing too much water in the body. The main goals of treating this condition are:  To relieve your nausea.  To ensure your nausea occurs less often.  To prevent throwing up and losing too much fluid. Follow these instructions at home: Watch your symptoms for any changes. Tell your doctor about them. Follow these instructions as told by your doctor. Eating and drinking  Take an ORS (oral rehydration solution). This is a drink that is sold at pharmacies and stores.  Drink clear fluids in small amounts as you are able. These include: ? Water. ? Ice chips. ? Fruit juice that has water added (diluted fruit juice). ? Low-calorie sports drinks.  Eat bland, easy-to-digest foods in small amounts as you are able, such as: ? Bananas. ? Applesauce. ? Rice. ? Low-fat (lean) meats. ? Toast. ? Crackers.  Avoid drinking fluids that have a lot of sugar or caffeine in them. This includes energy drinks, sports drinks, and soda.  Avoid alcohol.  Avoid spicy or fatty foods.      General instructions  Take over-the-counter and prescription medicines only as told by your doctor.  Rest at home while you get better.  Drink enough fluid to keep your pee (urine) pale yellow.  Take slow and deep breaths when you feel  sick to your stomach.  Avoid food or things that have strong smells.  Wash your hands often with soap and water. If you cannot use soap and water, use hand sanitizer.  Make sure that all people in your home wash their hands well and often.  Keep all follow-up visits as told by your doctor. This is important. Contact a doctor if:  You feel sicker to your stomach.  You feel sick to your stomach for more than 2 days.  You throw up.  You are not able to drink fluids without throwing up.  You have new symptoms.  You have a fever.  You have a headache.  You have muscle cramps.  You have a rash.  You have pain while peeing.  You feel light-headed or dizzy. Get help right away if:  You have pain in your chest, neck, arm, or jaw.  You feel very weak or you pass out (faint).  You have throw up that is bright red or looks like coffee grounds.  You have bloody or black poop (stools) or poop that looks like tar.  You have a very bad headache, a stiff neck, or both.  You have very bad pain, cramping, or bloating in your belly (abdomen).  You have trouble breathing or you are breathing very quickly.  Your heart is beating very quickly.  Your skin feels cold and clammy.  You feel confused.  You have signs of losing too much water in your body, such as: ? Dark pee, very little pee, or no pee. ? Cracked lips. ? Dry mouth. ? Sunken eyes. ? Sleepiness. ? Weakness. These symptoms may be an emergency. Do not wait to see if the symptoms will go away. Get medical help right away. Call your local emergency services (911 in the [place] Do not drive yourself to the hospital. Summary  Nausea is feeling sick to your stomach or feeling that you are about to throw up (vomit).  If you throw up, or if you are not able to drink enough fluids, there is a risk that you may lose too much water in your body (get dehydrated).  Eat and drink what your doctor tells you. Take  over-the-counter and prescription medicines only as told by your doctor.  Contact a doctor right away if your symptoms get worse or you have new symptoms.  Keep all follow-up visits as told by your doctor. This is important. This information is not intended to replace advice given to you by your health care provider. Make sure you discuss any questions you have with your health care provider. Document Revised: 11/24/2018 Document Reviewed: 06/03/2017 Elsevier Patient Education  2021 [place]. Ondansetron tablets What is this medicine? ONDANSETRON (on DAN se tron) is used to treat nausea and vomiting caused by chemotherapy. It is also used to prevent or treat nausea and vomiting after surgery. This medicine may be used for other purposes; ask your health care provider or pharmacist if you have questions. COMMON BRAND NAME(S): Zofran What should I tell my health care provider before I take this medicine? They need to know if you have any of these conditions:  heart disease  history of irregular heartbeat  liver disease  low levels of magnesium or potassium in the blood  an unusual or allergic reaction to ondansetron, granisetron, other medicines, foods, dyes, or preservatives  pregnant or trying to get pregnant  breast-feeding How should I use this medicine? Take this medicine by mouth with a glass of water. Follow the directions on your prescription label. Take your doses at regular intervals. Do not take your medicine more often than directed. Talk to your pediatrician regarding the use of this medicine in children. Special care may be needed. Overdosage: If you think you have taken too much of this medicine contact a poison control center or emergency room at once. NOTE: This medicine is only for you. Do not share this medicine with others. What if I miss a dose? If you miss a dose, take it as soon as you can. If it is almost time for your next dose, take only that dose. Do  not take double or extra doses. What may interact with this medicine? Do not take this medicine with any of the following medications:  apomorphine  certain medicines for fungal infections like fluconazole, itraconazole, ketoconazole, posaconazole, voriconazole  cisapride  dronedarone  pimozide  thioridazine This medicine may also interact with the following medications:  carbamazepine  certain medicines for depression, anxiety, or psychotic disturbances  fentanyl  linezolid  MAOIs like Carbex, Eldepryl, Marplan, Nardil, and Parnate  methylene blue (injected into a vein)  other medicines that prolong the QT interval (cause an abnormal heart rhythm) like dofetilide, ziprasidone  phenytoin  rifampicin  tramadol This list may not describe all possible interactions. Give your health care provider a list of all the medicines, herbs, non-prescription drugs, or dietary supplements you use. Also  tell them if you smoke, drink alcohol, or use illegal drugs. Some items may interact with your medicine. What should I watch for while using this medicine? Check with your doctor or health care professional right away if you have any sign of an allergic reaction. What side effects may I notice from receiving this medicine? Side effects that you should report to your doctor or health care professional as soon as possible:  allergic reactions like skin rash, itching or hives, swelling of the face, lips or tongue  breathing problems  confusion  dizziness  fast or irregular heartbeat  feeling faint or lightheaded, falls  fever and chills  loss of balance or coordination  seizures  sweating  swelling of the hands or feet  tightness in the chest  tremors  unusually weak or tired Side effects that usually do not require medical attention (report to your doctor or health care professional if they continue or are bothersome):  constipation or diarrhea  headache This  list may not describe all possible side effects. Call your doctor for medical advice about side effects. You may report side effects to FDA at 1-800-FDA-1088. Where should I keep my medicine? Keep out of the reach of children. Store between 2 and 30 degrees C (36 and 86 degrees F). Throw away any unused medicine after the expiration date. NOTE: This sheet is a summary. It may not cover all possible information. If you have questions about this medicine, talk to your doctor, pharmacist, or health care provider.  2021 Elsevier/Gold Standard (2017-12-16 07:16:43) +

## 2020-04-10 NOTE — Progress Notes (Signed)
Patient [facility]   Established Patient Office Visit  Subjective:  Patient ID: Haley Freeman, female    DOB: Jan 11, 1975  Age: 45 y.o. MRN: [id number]  CC:  Chief Complaint  Patient presents with  . Anemia    HPI Haley Freeman is a 45 year old female who presents for Follow Up today.   Patient Active Problem List   Diagnosis Date Noted  . Morbid obesity ([place]) 11/10/2019  . GERD (gastroesophageal reflux disease) 05/17/2019  . History of ETOH abuse 05/17/2019  . Mixed hyperlipidemia 05/17/2019  . Bipolar disorder with depression ([place]) 03/18/2019  . Cyst of left ovary 09/07/2018  . Gastroesophageal reflux disease with esophagitis 06/04/2018  . Chronic anemia 05/04/2018  . Hypokalemia 09/27/2017  . Elevated LFTs 09/27/2017  . Abnormal biliary HIDA scan 09/27/2017  . Marijuana abuse 09/27/2017  . Intractable nausea and vomiting 09/26/2017  . Cannabis hyperemesis syndrome concurrent with and due to cannabis abuse ([place]) 09/16/2017  . Dermoid 08/22/2017   Current Status: Since her last office visit, she continues to have occasional nausea/vomiting r/t personal, family, and school stressors. She is currently following up with Ms. Dwaine Deter, MD in GI at [facility]. She states that she has follow up appointment with Psychiatry on 04/17/2020. She denies suicidal ideations, homicidal ideations, or auditory hallucinations. She denies fevers, chills, fatigue, recent infections, weight loss, and night sweats. She has not had any headaches, visual changes, dizziness, and falls. No chest pain, heart palpitations, cough and shortness of breath reported. Denies GI problems such as diarrhea, and constipation. She has no reports of blood in stools, dysuria and hematuria. She is taking all medications as prescribed. She denies pain today.   Past Medical History:  Diagnosis Date  . Alcohol abuse   . Anxiety   . Back pain   . Chronic anemia  05/04/2018  . Constipation   . CVS disease   . Depression   . Edema of both lower legs   . Gallbladder problem   . GERD (gastroesophageal reflux disease) 05/17/2019  . Headache(784.0)   . History of cholecystectomy 2019  . Hypokalemia   . Joint pain   . Neuromuscular disorder ([place])   . S/P removal of right ovary 08/2017  . SOB (shortness of breath)     Past Surgical History:  Procedure Laterality Date  . CESAREAN SECTION  '99 and '08   x 2  . CHOLECYSTECTOMY N/A 10/01/2017   Procedure: LAPAROSCOPIC CHOLECYSTECTOMY WITH POSSIBLE  INTRAOPERATIVE CHOLANGIOGRAM;  Surgeon: Stark Klein, MD;  Location: [facility];  Service: General;  Laterality: N/A;  . ESOPHAGOGASTRODUODENOSCOPY N/A 09/16/2017   Procedure: ESOPHAGOGASTRODUODENOSCOPY (EGD) WITH PROPOFOL;  Surgeon: Clarene Essex, MD;  Location: [facility];  Service: Endoscopy;  Laterality: N/A;  . LAPAROSCOPY Right 08/22/2017   Procedure: LAPAROSCOPY OPERATIVE removal of right ovary and right dermoid cyst;  Surgeon: Sloan Leiter, MD;  Location: [facility];  Service: Gynecology;  Laterality: Right;    Family History  Problem Relation Age of Onset  . Hypertension Mother   . High Cholesterol Mother   . Heart disease Mother   . Thyroid disease Mother   . Depression Mother   . Anxiety disorder Mother   . Alcoholism Mother   . Drug abuse Mother   . Cancer Father   . Depression Father   . Alcohol abuse Father   . Drug abuse Father     Social History   Socioeconomic History  .  Marital status: Divorced    Spouse name: Not on file  . Number of children: Not on file  . Years of education: Not on file  . Highest education level: Not on file  Occupational History  . Occupation: Ship broker  Tobacco Use  . Smoking status: Former Smoker    Packs/day: 0.50  . Smokeless tobacco: Never Used  Vaping Use  . Vaping Use: Never used  Substance and Sexual Activity  . Alcohol use: Not Currently  . Drug use: Yes    Types: Other-see comments,  Marijuana    Comment: Occasionally smokes marijuana   . Sexual activity: Not Currently    Birth control/protection: None  Other Topics Concern  . Not on file  Social History Narrative  . Not on file   Social Determinants of Health   Financial Resource Strain: Not on file  Food Insecurity: Not on file  Transportation Needs: Not on file  Physical Activity: Not on file  Stress: Not on file  Social Connections: Not on file  Intimate Partner Violence: Not on file    Outpatient Medications Prior to Visit  Medication Sig Dispense Refill  . amitriptyline (ELAVIL) 100 MG tablet Take 100 mg by mouth at bedtime.     . diphenhydrAMINE (BENADRYL) 25 mg capsule Take 25 mg by mouth every 6 (six) hours as needed for allergies or sleep.    . traZODone (DESYREL) 100 MG tablet Take 300 mg by mouth at bedtime.      No facility-administered medications prior to visit.    Allergies  Allergen Reactions  . Penicillins Anaphylaxis    Swells throat. Has patient had a PCN reaction causing immediate rash, facial/tongue/throat swelling, SOB or lightheadedness with hypotension: Yes Has patient had a PCN reaction causing severe rash involving mucus membranes or skin necrosis: No Has patient had a PCN reaction that required hospitalization: Yes Has patient had a PCN reaction occurring within the last 10 years: No If all of the above answers are "NO", then may proceed with Cephalosporin use.   . Strawberry (Diagnostic) Shortness Of Breath   ROS Review of Systems  Constitutional: Negative.   HENT: Negative.   Eyes: Negative.   Respiratory: Negative.   Cardiovascular: Negative.   Gastrointestinal: Positive for abdominal distention (obese).  Endocrine: Negative.   Genitourinary: Negative.   Musculoskeletal: Negative.   Skin: Negative.   Allergic/Immunologic: Negative.   Neurological: Negative.   Hematological: Negative.   Psychiatric/Behavioral: Negative.    Objective:    Physical Exam Vitals  and nursing note reviewed.  Constitutional:      Appearance: Normal appearance.  HENT:     Head: Normocephalic and atraumatic.     Nose: Nose normal.     Mouth/Throat:     Mouth: Mucous membranes are moist.     Pharynx: Oropharynx is clear.  Cardiovascular:     Rate and Rhythm: Normal rate and regular rhythm.     Pulses: Normal pulses.     Heart sounds: Normal heart sounds.  Pulmonary:     Effort: Pulmonary effort is normal.     Breath sounds: Normal breath sounds.  Abdominal:     General: Bowel sounds are normal.     Palpations: Abdomen is soft.  Musculoskeletal:        General: Normal range of motion.     Cervical back: Normal range of motion and neck supple.  Skin:    General: Skin is warm and dry.  Neurological:     General: No focal  deficit present.     Mental Status: She is alert and oriented to person, place, and time.  Psychiatric:        Mood and Affect: Mood normal.        Behavior: Behavior normal.        Thought Content: Thought content normal.        Judgment: Judgment normal.     BP 117/76   Pulse (!) 101   Ht 5\' 8"  (1.727 m)   Wt 277 lb (125.6 kg)   SpO2 98%   BMI 42.12 kg/m  Wt Readings from Last 3 Encounters:  04/10/20 277 lb (125.6 kg)  11/10/19 274 lb (124.3 kg)  03/16/19 (!) 302 lb 9.6 oz (137.3 kg)     Health Maintenance Due  Topic Date Due  . PAP SMEAR-Modifier  Never done    There are no preventive care reminders to display for this patient.  Lab Results  Component Value Date   TSH 0.895 06/03/2018   Lab Results  Component Value Date   WBC 20.7 (H) 07/14/2019   HGB 10.2 (L) 04/10/2020   HCT 35.1 04/10/2020   MCV 74.8 (L) 07/14/2019   PLT 343 07/14/2019   Lab Results  Component Value Date   NA 139 07/14/2019   K 3.6 07/14/2019   CO2 18 (L) 07/14/2019   GLUCOSE 164 (H) 07/14/2019   BUN 14 07/14/2019   CREATININE 1.01 (H) 07/14/2019   BILITOT 0.4 07/14/2019   ALKPHOS 96 07/14/2019   AST 25 07/14/2019   ALT 16  07/14/2019   PROT 8.3 (H) 07/14/2019   ALBUMIN 4.3 07/14/2019   CALCIUM 9.9 07/14/2019   ANIONGAP 15 07/14/2019   Lab Results  Component Value Date   CHOL 183 06/03/2018   Lab Results  Component Value Date   HDL 42 06/03/2018   Lab Results  Component Value Date   LDLCALC 103 (H) 06/03/2018   Lab Results  Component Value Date   TRIG 188 (H) 06/03/2018   Lab Results  Component Value Date   CHOLHDL 4.4 06/03/2018   Lab Results  Component Value Date   HGBA1C 5.5 03/16/2019   Assessment & Plan:   1. Nausea and vomiting, intractability of vomiting not specified, unspecified vomiting type - ondansetron (ZOFRAN) 4 MG tablet; Take 1 tablet (4 mg total) by mouth every 8 (eight) hours as needed for nausea or vomiting.  Dispense: 20 tablet; Refill: 0  2. Anemia, unspecified type - Hemoglobin and Hematocrit, Blood - Iron and TIBC([facility])  3. Bipolar disorder with depression ([place])  4. Healthcare maintenance  5. Follow up She will follow up in 2 months.   Meds ordered this encounter  Medications  . ondansetron (ZOFRAN) 4 MG tablet    Sig: Take 1 tablet (4 mg total) by mouth every 8 (eight) hours as needed for nausea or vomiting.    Dispense:  20 tablet    Refill:  0    Orders Placed This Encounter  Procedures  . Hemoglobin and Hematocrit, Blood  . Iron and TIBC([facility])    Referral Orders  No referral(s) requested today    Kathe Becton, MSN, ANE, FNP-BC [facility] [address] [place], [place] [address] [phone]- fax   Problem List Items Addressed This Visit      Other   Bipolar disorder with depression ([place])    Other Visit Diagnoses    Nausea and vomiting, intractability of  vomiting not specified, unspecified vomiting type    -  Primary   Relevant Medications   ondansetron (ZOFRAN) 4 MG tablet   Anemia, unspecified type        Relevant Orders   Hemoglobin and Hematocrit, Blood (Completed)   Iron and TIBC([facility]) (Completed)   Healthcare maintenance       Follow up          Meds ordered this encounter  Medications  . ondansetron (ZOFRAN) 4 MG tablet    Sig: Take 1 tablet (4 mg total) by mouth every 8 (eight) hours as needed for nausea or vomiting.    Dispense:  20 tablet    Refill:  0    Follow-up: No follow-ups on file.    Azzie Glatter, FNP

## 2020-04-11 ENCOUNTER — Other Ambulatory Visit: Payer: Self-pay | Admitting: Family Medicine

## 2020-04-11 DIAGNOSIS — D509 Iron deficiency anemia, unspecified: Secondary | ICD-10-CM

## 2020-04-11 LAB — IRON AND TIBC
Iron Saturation: 3 % — CL (ref 15–55)
Iron: 13 ug/dL — ABNORMAL LOW (ref 27–159)
Total Iron Binding Capacity: 452 ug/dL — ABNORMAL HIGH (ref 250–450)
UIBC: 439 ug/dL — ABNORMAL HIGH (ref 131–425)

## 2020-04-11 LAB — HEMOGLOBIN AND HEMATOCRIT, BLOOD
Hematocrit: 35.1 % (ref 34.0–46.6)
Hemoglobin: 10.2 g/dL — ABNORMAL LOW (ref 11.1–15.9)

## 2020-04-12 ENCOUNTER — Encounter: Payer: Self-pay | Admitting: Family Medicine

## 2020-05-02 DIAGNOSIS — D72829 Elevated white blood cell count, unspecified: Secondary | ICD-10-CM | POA: Diagnosis not present

## 2020-05-02 DIAGNOSIS — Z87891 Personal history of nicotine dependence: Secondary | ICD-10-CM | POA: Diagnosis not present

## 2020-05-02 DIAGNOSIS — R0789 Other chest pain: Secondary | ICD-10-CM | POA: Insufficient documentation

## 2020-05-02 DIAGNOSIS — D649 Anemia, unspecified: Secondary | ICD-10-CM | POA: Diagnosis not present

## 2020-05-02 DIAGNOSIS — R079 Chest pain, unspecified: Secondary | ICD-10-CM | POA: Diagnosis not present

## 2020-05-03 ENCOUNTER — Emergency Department (HOSPITAL_COMMUNITY)
Admission: EM | Admit: 2020-05-03 | Discharge: 2020-05-03 | Disposition: A | Discharge status: Home / Self Care | Payer: Medicare Other | Attending: Emergency Medicine | Admitting: Emergency Medicine

## 2020-05-03 ENCOUNTER — Encounter (HOSPITAL_COMMUNITY): Payer: Self-pay

## 2020-05-03 ENCOUNTER — Other Ambulatory Visit: Payer: Self-pay

## 2020-05-03 ENCOUNTER — Emergency Department (HOSPITAL_COMMUNITY): Payer: Medicare Other

## 2020-05-03 DIAGNOSIS — R079 Chest pain, unspecified: Secondary | ICD-10-CM | POA: Diagnosis not present

## 2020-05-03 DIAGNOSIS — R0789 Other chest pain: Secondary | ICD-10-CM

## 2020-05-03 LAB — CBC
HCT: 32.2 % — ABNORMAL LOW (ref 36.0–46.0)
Hemoglobin: 9.5 g/dL — ABNORMAL LOW (ref 12.0–15.0)
MCH: 19.8 pg — ABNORMAL LOW (ref 26.0–34.0)
MCHC: 29.5 g/dL — ABNORMAL LOW (ref 30.0–36.0)
MCV: 67.2 fL — ABNORMAL LOW (ref 80.0–100.0)
Platelets: 294 10*3/uL (ref 150–400)
RBC: 4.79 MIL/uL (ref 3.87–5.11)
RDW: 19.4 % — ABNORMAL HIGH (ref 11.5–15.5)
WBC: 17.6 10*3/uL — ABNORMAL HIGH (ref 4.0–10.5)
nRBC: 0 % (ref 0.0–0.2)

## 2020-05-03 LAB — BASIC METABOLIC PANEL
Anion gap: 8 (ref 5–15)
BUN: 14 mg/dL (ref 6–20)
CO2: 22 mmol/L (ref 22–32)
Calcium: 8.9 mg/dL (ref 8.9–10.3)
Chloride: 106 mmol/L (ref 98–111)
Creatinine, Ser: 1.14 mg/dL — ABNORMAL HIGH (ref 0.44–1.00)
GFR, Estimated: >60 mL/min (ref >60)
Glucose, Bld: 107 mg/dL — ABNORMAL HIGH (ref 70–99)
Potassium: 3.5 mmol/L (ref 3.5–5.1)
Sodium: 136 mmol/L (ref 135–145)

## 2020-05-03 LAB — TROPONIN I (HIGH SENSITIVITY): Troponin I (High Sensitivity): 3 ng/L (ref <18)

## 2020-05-03 LAB — I-STAT BETA HCG BLOOD, ED (MC, WL, AP ONLY): I-stat hCG, quantitative: <5 m[IU]/mL (ref <5)

## 2020-05-03 NOTE — ED Provider Notes (Signed)
[facility] EMERGENCY DEPARTMENT Provider Note  CSN: [id number] Arrival date & time: 05/02/20 2357    History Chief Complaint  Patient presents with  . Chest Pain    HPI  Haley Freeman is a 45 y.o. female presents for evaluation of chest pain, ongoing for about 2 days, described as a pin and needles sensation in her L upper back, radiating around to her left chest. No rash. Pain comes and goes, improved with ibuprofen. Denies any associated SOB, nausea, diaphoresis. She denies history of CAD, HTN, DM, HLD, smokes occasionally, has several family members with MI in their 28s.     Past Medical History:  Diagnosis Date  . Alcohol abuse   . Anxiety   . Back pain   . Chronic anemia 05/04/2018  . Constipation   . CVS disease   . Depression   . Edema of both lower legs   . Gallbladder problem   . GERD (gastroesophageal reflux disease) 05/17/2019  . Headache(784.0)   . History of cholecystectomy 2019  . Hypokalemia   . Joint pain   . Neuromuscular disorder ([place])   . S/P removal of right ovary 08/2017  . SOB (shortness of breath)     Past Surgical History:  Procedure Laterality Date  . CESAREAN SECTION  '99 and '08   x 2  . CHOLECYSTECTOMY N/A 10/01/2017   Procedure: LAPAROSCOPIC CHOLECYSTECTOMY WITH POSSIBLE  INTRAOPERATIVE CHOLANGIOGRAM;  Surgeon: Stark Klein, MD;  [facility];  Service: General;  Laterality: N/A;  . ESOPHAGOGASTRODUODENOSCOPY N/A 09/16/2017   Procedure: ESOPHAGOGASTRODUODENOSCOPY (EGD) WITH PROPOFOL;  Surgeon: Clarene Essex, MD;  Location: [facility];  Service: Endoscopy;  Laterality: N/A;  . LAPAROSCOPY Right 08/22/2017   Procedure: LAPAROSCOPY OPERATIVE removal of right ovary and right dermoid cyst;  Surgeon: Sloan Leiter, MD;  Location: [facility];  Service: Gynecology;  Laterality: Right;    Family History  Problem Relation Age of Onset  . Hypertension Mother   . High Cholesterol Mother   . Heart disease Mother   . Thyroid disease Mother    . Depression Mother   . Anxiety disorder Mother   . Alcoholism Mother   . Drug abuse Mother   . Cancer Father   . Depression Father   . Alcohol abuse Father   . Drug abuse Father     Social History   Tobacco Use  . Smoking status: Former Smoker    Packs/day: 0.50  . Smokeless tobacco: Never Used  Vaping Use  . Vaping Use: Never used  Substance Use Topics  . Alcohol use: Not Currently  . Drug use: Yes    Types: Other-see comments, Marijuana    Comment: Occasionally smokes marijuana      Home Medications Prior to Admission medications   Medication Sig Start Date End Date Taking? Authorizing Provider  amitriptyline (ELAVIL) 100 MG tablet Take 100 mg by mouth at bedtime.  02/22/19   [provider]  ondansetron (ZOFRAN) 4 MG tablet Take 1 tablet (4 mg total) by mouth every 8 (eight) hours as needed for nausea or vomiting. 04/10/20   Azzie Glatter, FNP  ferrous sulfate 325 (65 FE) MG tablet Take 1 tablet (325 mg total) by mouth daily with breakfast. 05/05/18 08/23/18  Shelly Coss, MD  pantoprazole (PROTONIX) 40 MG tablet Take 1 tablet (40 mg total) by mouth daily. 04/24/18 08/23/18  Sherwood Gambler, MD  sucralfate (CARAFATE) 1 g tablet Take 1 tablet (1 g total) by mouth 4 (four) times  daily -  with meals and at bedtime. 04/26/18 08/23/18  Julianne Rice, MD     Allergies    Penicillins and Strawberry (diagnostic)   Review of Systems   Review of Systems A comprehensive review of systems was completed and negative except as noted in HPI.    Physical Exam BP 115/77   Pulse 79   Temp 99 F (37.2 C) (Oral)   Resp 20   Ht 5\' 8"  (1.727 m)   Wt 125.6 kg   LMP 04/13/2020 (Within Days)   SpO2 99%   BMI 42.10 kg/m   Physical Exam Vitals and nursing note reviewed.  Constitutional:      Appearance: Normal appearance.  HENT:     Head: Normocephalic and atraumatic.     Nose: Nose normal.     Mouth/Throat:     Mouth: Mucous membranes are moist.  Eyes:      Extraocular Movements: Extraocular movements intact.     Conjunctiva/sclera: Conjunctivae normal.  Cardiovascular:     Rate and Rhythm: Normal rate.  Pulmonary:     Effort: Pulmonary effort is normal.     Breath sounds: Normal breath sounds.  Chest:     Chest wall: No tenderness.  Abdominal:     General: Abdomen is flat.     Palpations: Abdomen is soft.     Tenderness: There is no abdominal tenderness.  Musculoskeletal:        General: No swelling. Normal range of motion.     Cervical back: Neck supple.  Skin:    General: Skin is warm and dry.     Findings: No rash (specifically no shingles rash on chest or back).  Neurological:     General: No focal deficit present.     Mental Status: She is alert.  Psychiatric:        Mood and Affect: Mood normal.      ED Results / Procedures / Treatments   Labs (all labs ordered are listed, but only abnormal results are displayed) Labs Reviewed  BASIC METABOLIC PANEL - Abnormal; Notable for the following components:      Result Value   Glucose, Bld 107 (*)    Creatinine, Ser 1.14 (*)    All other components within normal limits  CBC - Abnormal; Notable for the following components:   WBC 17.6 (*)    Hemoglobin 9.5 (*)    HCT 32.2 (*)    MCV 67.2 (*)    MCH 19.8 (*)    MCHC 29.5 (*)    RDW 19.4 (*)    All other components within normal limits  I-STAT BETA HCG BLOOD, ED ([facility], [facility], [facility] ONLY)  TROPONIN I (HIGH SENSITIVITY)    EKG NSR Rate 98 LAE Normal intervals Normal ST/T waves    Radiology DG Chest 2 View  Result Date: 05/03/2020 CLINICAL DATA:  Chest pain EXAM: CHEST - 2 VIEW COMPARISON:  None. FINDINGS: The heart size and mediastinal contours are within normal limits. Both lungs are clear. The visualized skeletal structures are unremarkable. IMPRESSION: No active cardiopulmonary disease. Electronically Signed   By: Fidela Salisbury MD   On: 05/03/2020 01:14    Procedures Procedures  Medications Ordered in the  ED Medications - No data to display   MDM Rules/Calculators/A&P MDM Patient with nonspecific chest pain. Low risk for ACS, pain has been present for 2 days. Will check labs, including single Trop and CXR.  ED Course  I have reviewed the triage vital signs and  the nursing notes.  Pertinent labs & imaging results that were available during my care of the patient were reviewed by me and considered in my medical decision making (see chart for details).  Clinical Course as of 05/03/20 0152  Wed May 03, 2020  0120 BMP, Trop are unremarkable.  [CS]  9323 CXR is clear.  [CS]  5573 CBC with leukocytosis and anemia, both similar to previous results.  [CS]  2202 Patient resting comfortably. Discussed results including abnormal blood counts which she acknowledges is normal for her. Recommend OTC NSAIDs as needed for discomfort and follow up with PCP if not improving.  [CS]    Clinical Course User Index [CS] Truddie Hidden, MD    Final Clinical Impression(s) / ED Diagnoses Final diagnoses:  Atypical chest pain    Rx / DC Orders ED Discharge Orders    None       Truddie Hidden, MD 05/03/20 [phone]

## 2020-05-03 NOTE — ED Triage Notes (Signed)
Pt presents from home, c/o intermittent L sided chest pain described as pins & needles and squeezing x 2 days. States it has improved with ibuprofen. Denies any SOB or dizziness

## 2020-05-16 ENCOUNTER — Telehealth: Payer: Self-pay

## 2020-05-16 NOTE — Telephone Encounter (Signed)
Spk w/ patient and she said that she went to ER asnd that she was fine and she will wait for appt in June

## 2020-05-26 ENCOUNTER — Other Ambulatory Visit: Payer: Self-pay

## 2020-05-26 ENCOUNTER — Other Ambulatory Visit: Payer: Self-pay | Admitting: Nurse Practitioner

## 2020-05-26 ENCOUNTER — Ambulatory Visit (INDEPENDENT_AMBULATORY_CARE_PROVIDER_SITE_OTHER): Payer: Medicare Other | Admitting: Nurse Practitioner

## 2020-05-26 DIAGNOSIS — R350 Frequency of micturition: Secondary | ICD-10-CM | POA: Diagnosis not present

## 2020-05-26 DIAGNOSIS — R319 Hematuria, unspecified: Secondary | ICD-10-CM

## 2020-05-26 DIAGNOSIS — N390 Urinary tract infection, site not specified: Secondary | ICD-10-CM

## 2020-05-26 LAB — POCT URINALYSIS DIPSTICK
Bilirubin, UA: NEGATIVE
Glucose, UA: NEGATIVE
Ketones, UA: NEGATIVE
Nitrite, UA: NEGATIVE
Protein, UA: NEGATIVE
Spec Grav, UA: 1.015 (ref 1.010–1.025)
Urobilinogen, UA: 0.2 E.U./dL
pH, UA: 6.5 (ref 5.0–8.0)

## 2020-05-26 MED ORDER — NITROFURANTOIN MONOHYD MACRO 100 MG PO CAPS: 100.0000 mg | ORAL_CAPSULE | Freq: Two times a day (BID) | ORAL | 0 refills | Status: AC

## 2020-05-26 NOTE — Progress Notes (Signed)
Patient in for urine analysis. Stated it hurts when urinating and frequent trips to restroom with small amounts.

## 2020-05-26 NOTE — Progress Notes (Unsigned)
   [place], [place]  [address] Phone:  [phone]   Fax:  [phone] Urinary Tract Infection Patient complains of dysuria. She has had symptoms for {1-10:13787} {time; units:18646}. Patient also complains of {symptoms; VKP:22449}. Patient denies {symptoms PNP:00511}. Patient does not have a history of recurrent UTI. Patient does not have a history of pyelonephritis.

## 2020-06-19 ENCOUNTER — Ambulatory Visit: Payer: Medicare Other | Admitting: Nurse Practitioner

## 2020-09-17 ENCOUNTER — Telehealth: Payer: Self-pay

## 2020-09-17 NOTE — Telephone Encounter (Signed)
09/17/2020 Name: Haley Freeman MRN: [id number] DOB: 1975/03/16  Unsuccessful outbound call made today to assist with:   scheduling Medicare AWV  Outreach Attempt:  1st Attempt  A HIPAA compliant voice message was left requesting a return call.  Instructed patient to call back at [phone].  Eugenia Mcalpine

## 2020-09-28 ENCOUNTER — Telehealth: Payer: Self-pay

## 2020-09-28 NOTE — Telephone Encounter (Signed)
Patient is due for Medicare Wellness Exam. Attempted to reach patient no answer and vm to leave a message. Outreach Attempt: 2nd Attempt

## 2020-09-30 ENCOUNTER — Ambulatory Visit (INDEPENDENT_AMBULATORY_CARE_PROVIDER_SITE_OTHER): Payer: Medicare Other

## 2020-09-30 DIAGNOSIS — Z0000 Encounter for general adult medical examination without abnormal findings: Secondary | ICD-10-CM | POA: Diagnosis not present

## 2020-09-30 NOTE — Progress Notes (Signed)
Subjective:  I connected with  Haley Freeman on 09/30/20 by an audio only telemedicine application and verified that I am speaking with the correct person using two identifiers.   I discussed the limitations, risks, security and privacy concerns of performing an evaluation and management service by telephone and the availability of in person appointments. I also discussed with the patient that there may be a patient responsible charge related to this service. The patient expressed understanding and verbally consented to this telephonic visit.  Location of Patient: Home Location of Provider: Office  List any persons and their role that are participating in the visit with the patient. None    Review of Systems    Defer to PCP       Objective:    Today's Vitals   09/30/20 1302  PainSc: 4    There is no height or weight on file to calculate BMI.  Advanced Directives 09/30/2020 05/03/2020 07/14/2019 08/27/2018 05/29/2018 05/26/2018 05/24/2018  Does Patient Have a Medical Advance Directive? No No No No No No No  Would patient like information on creating a medical advance directive? Yes (Inpatient - patient defers creating a medical advance directive at this time - Information given) No - Patient declined No - Patient declined No - Patient declined No - Patient declined No - Guardian declined No - Guardian declined  Some encounter information is confidential and restricted. Go to Review Flowsheets activity to see all data.    Current Medications (verified) Outpatient Encounter Medications as of 09/30/2020  Medication Sig   amitriptyline (ELAVIL) 100 MG tablet Take 100 mg by mouth at bedtime.    ondansetron (ZOFRAN) 4 MG tablet Take 1 tablet (4 mg total) by mouth every 8 (eight) hours as needed for nausea or vomiting.   [DISCONTINUED] ferrous sulfate 325 (65 FE) MG tablet Take 1 tablet (325 mg total) by mouth daily with breakfast.   [DISCONTINUED] pantoprazole (PROTONIX) 40 MG tablet Take  1 tablet (40 mg total) by mouth daily.   [DISCONTINUED] sucralfate (CARAFATE) 1 g tablet Take 1 tablet (1 g total) by mouth 4 (four) times daily -  with meals and at bedtime.   No facility-administered encounter medications on file as of 09/30/2020.    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   SOB (shortness of breath)    Past Surgical History:  Procedure Laterality Date   CESAREAN SECTION  '99 and '08   x 2   CHOLECYSTECTOMY N/A 10/01/2017   Procedure: LAPAROSCOPIC CHOLECYSTECTOMY WITH POSSIBLE  INTRAOPERATIVE CHOLANGIOGRAM;  Surgeon: Stark Klein, MD;  Location: [facility];  Service: General;  Laterality: N/A;   ESOPHAGOGASTRODUODENOSCOPY N/A 09/16/2017   Procedure: ESOPHAGOGASTRODUODENOSCOPY (EGD) WITH PROPOFOL;  Surgeon: Clarene Essex, MD;  Location: [facility] END[facility]COPY;  Service: Endoscopy;  Laterality: N/A;   LAPAROSCOPY Right 08/22/2017   Procedure: LAPAROSCOPY OPERATIVE removal of right ovary and right dermoid cyst;  Surgeon: Sloan Leiter, MD;  Location: [facility];  Service: Gynecology;  Laterality: Right;   Family History  Problem Relation Age of Onset   Hypertension Mother    High Cholesterol Mother    Heart disease Mother    Thyroid disease Mother    Depression Mother    Anxiety disorder Mother    Alcoholism Mother    Drug abuse Mother    Cancer Father    Depression Father    Alcohol abuse Father    Drug abuse Father    Social History   Socioeconomic History   Marital status: Divorced    Spouse name: Not on file   Number of children:  Not on file   Years of education: Not on file   Highest education level: Not on file  Occupational History   Occupation: Student  Tobacco Use   Smoking status: Former    Packs/day: 0.50    Types: Cigarettes    Quit date: 09/07/2009    Years since quitting: 11.0   Smokeless tobacco: Never  Vaping Use   Vaping Use: Never used  Substance and Sexual Activity   Alcohol use: Not Currently   Drug use: Yes    Types: Other-see comments, Marijuana    Comment: Occasionally smokes marijuana    Sexual activity: Not Currently    Birth control/protection: None  Other Topics Concern   Not on file  Social History Narrative   Not on file   Social Determinants of Health   Financial Resource Strain: High Risk   Difficulty of Paying Living Expenses: Hard  Food Insecurity: Not on file  Transportation Needs: No Transportation Needs   Lack of Transportation (Medical): No   Lack of Transportation (Non-Medical): No  Physical Activity: Insufficiently Active   Days of Exercise per Week: 2 days   Minutes of Exercise per Session: 30 min  Stress: Stress Concern Present   Feeling of Stress : To some extent  Social Connections: Not on file    Tobacco Counseling Counseling given: Not Answered   Clinical Intake:  Pre-visit preparation completed: Yes  Pain : 0-10 Pain Score: 4  Pain Type: Other (Comment) Pain Location: Hip Pain Orientation: Right Pain Descriptors / Indicators: Sore, Tightness Pain Onset: Yesterday Pain Frequency: Intermittent Pain Relieving Factors: Naproxen Effect of Pain on Daily Activities: Slows pt down with daily activites  Pain Relieving Factors: Naproxen  Diabetes: No  How often do you need to have someone help you when you read instructions, pamphlets, or other written materials from your doctor or pharmacy?: 1 - Never  Diabetic?No  Interpreter Needed?: No      Activities of Daily Living In your present state of health, do you have any difficulty  performing the following activities: 09/30/2020  Hearing? N  Vision? N  Difficulty concentrating or making decisions? N  Walking or climbing stairs? N  Dressing or bathing? N  Doing errands, shopping? N  Preparing Food and eating ? N  Using the Toilet? N  In the past six months, have you accidently leaked urine? N  Do you have problems with loss of bowel control? N  Managing your Medications? N  Managing your Finances? N  Housekeeping or managing your Housekeeping? N  Some recent data might be hidden    Patient Care Team: Azzie Glatter, FNP (Inactive) as PCP - General (Family Medicine) Vevelyn Francois, NP as Nurse Practitioner (Adult Health Nurse Practitioner)  Indicate any recent Medical Services you may have received from other than [facility] providers in the past year (date may  be approximate).     Assessment:   This is a routine wellness examination for Haley Freeman.  Hearing/Vision screen No results found.  Dietary issues and exercise activities discussed:     Goals Addressed   None    Depression Screen PHQ 2/9 Scores 09/30/2020 11/10/2019 03/16/2019 09/07/2018 06/03/2018 12/25/2017 12/02/2017  PHQ - 2 Score 2 4 0 0 0 0 0  PHQ- 9 Score 9 16 - - - 7 7    Fall Risk Fall Risk  09/30/2020 03/16/2019 09/07/2018 06/03/2018 12/25/2017  Falls in the past year? 0 0 0 0 0  Number falls in past yr: 0 0 0 0 -  Injury with Fall? 0 0 0 0 -    FALL RISK PREVENTION PERTAINING TO THE HOME:  Any stairs in or around the home? Yes  If so, are there any without handrails? Yes  Home free of loose throw rugs in walkways, pet beds, electrical cords, etc? Yes  Adequate lighting in your home to reduce risk of falls? Yes   ASSISTIVE DEVICES UTILIZED TO PREVENT FALLS:  Life alert? No  Use of a cane, walker or w/c? No  Grab bars in the bathroom? No  Shower chair or bench in shower? No  Elevated toilet seat or a handicapped toilet? No   TIMED UP AND GO:  Was the test performed?  N/A .  Length  of time to ambulate 10 feet: N/A sec.   These questions cannot be answered via Telephone encounter.  Cognitive Function:     6CIT Screen 09/30/2020  What Year? 0 points  What month? 0 points  What time? 0 points  Count back from 20 0 points  Months in reverse 0 points  Repeat phrase 0 points  Total Score 0    Immunizations Immunization History  Administered Date(s) Administered   Influenza,inj,Quad PF,6+ Mos 09/07/2018   PPD Test 11/03/2014, 11/11/2014    TDAP status: Up to date  Flu Vaccine status: Due, Education has been provided regarding the importance of this vaccine. Advised may receive this vaccine at local pharmacy or Health Dept. Aware to provide a copy of the vaccination record if obtained from local pharmacy or Health Dept. Verbalized acceptance and understanding.  Pneumococcal vaccine status: Due, Education has been provided regarding the importance of this vaccine. Advised may receive this vaccine at local pharmacy or Health Dept. Aware to provide a copy of the vaccination record if obtained from local pharmacy or Health Dept. Verbalized acceptance and understanding.  Covid-19 vaccine status: Completed vaccines  Qualifies for Shingles Vaccine? No   Zostavax completed No   Shingrix Completed?: No.    Education has been provided regarding the importance of this vaccine. Patient has been advised to call insurance company to determine out of pocket expense if they have not yet received this vaccine. Advised may also receive vaccine at local pharmacy or Health Dept. Verbalized acceptance and understanding.  Screening Tests Health Maintenance  Topic Date Due   COVID-19 Vaccine (1) Never done   PAP SMEAR-Modifier  Never done   INFLUENZA VACCINE  08/07/2020   COLONOSCOPY (Pts 45-68yrs Insurance coverage will need to be confirmed)  04/10/2021 (Originally 01/25/2020)   TETANUS/TDAP  04/10/2021 (Originally 01/24/1994)   Hepatitis C Screening  Completed   HIV Screening   Completed   HPV VACCINES  Aged Out    Health Maintenance  Health Maintenance Due  Topic Date Due   COVID-19 Vaccine (1) Never done   PAP SMEAR-Modifier  Never done  INFLUENZA VACCINE  08/07/2020    Colorectal cancer screening: No longer required.   Mammogram status: Ordered No. Pt provided with contact info and advised to call to schedule appt. Discuss with provider.  Bone Density status: Ordered No. Pt provided with contact info and advised to call to schedule appt. Discuss with provider  Lung Cancer Screening: (Low Dose CT Chest recommended if Age 59-80 years, 30 pack-year currently smoking OR have quit w/in 15years.) does not qualify.   Lung Cancer Screening Referral: No  Additional Screening:  Hepatitis C Screening: does not qualify; Completed 2019  Vision Screening: Recommended annual ophthalmology exams for early detection of glaucoma and other disorders of the eye. Is the patient up to date with their annual eye exam?  No  Who is the provider or what is the name of the office in which the patient attends annual eye exams? N/A If pt is not established with a provider, would they like to be referred to a provider to establish care?  N/A .   Dental Screening: Recommended annual dental exams for proper oral hygiene  Community Resource Referral / Chronic Care Management: CRR required this visit?  Yes   CCM required this visit?  No      Plan:     I have personally reviewed and noted the following in the patient's chart:   Medical and social history Use of alcohol, tobacco or illicit drugs  Current medications and supplements including opioid prescriptions.  Functional ability and status Nutritional status Physical activity Advanced directives List of other physicians Hospitalizations, surgeries, and ER visits in previous 12 months Vitals Screenings to include cognitive, depression, and falls Referrals and appointments  In addition, I have reviewed and  discussed with patient certain preventive protocols, quality metrics, and best practice recommendations. A written personalized care plan for preventive services as well as general preventive health recommendations were provided to patient.     Lauralyn Primes, RMA   09/30/2020   Nurse Notes: Non Face to Face 45 minute visit encounter   Ms. Richardson Landry , Thank you for taking time to come for your Medicare Wellness Visit. I appreciate your ongoing commitment to your health goals. Please review the following plan we discussed and let me know if I can assist you in the future.   These are the goals we discussed:  Goals   None     This is a list of the screening recommended for you and due dates:  Health Maintenance  Topic Date Due   COVID-19 Vaccine (1) Never done   Pap Smear  Never done   Flu Shot  08/07/2020   Colon Cancer Screening  04/10/2021*   Tetanus Vaccine  04/10/2021*   Hepatitis C Screening: USPSTF Recommendation to screen - Ages 18-79 yo.  Completed   HIV Screening  Completed   HPV Vaccine  Aged Out  *Topic was postponed. The date shown is not the original due date.

## 2021-06-24 ENCOUNTER — Emergency Department (HOSPITAL_BASED_OUTPATIENT_CLINIC_OR_DEPARTMENT_OTHER): Payer: Medicare Other | Admitting: Radiology

## 2021-06-24 ENCOUNTER — Other Ambulatory Visit: Payer: Self-pay

## 2021-06-24 ENCOUNTER — Emergency Department (HOSPITAL_BASED_OUTPATIENT_CLINIC_OR_DEPARTMENT_OTHER)
Admission: EM | Admit: 2021-06-24 | Discharge: 2021-06-24 | Disposition: A | Discharge status: Home / Self Care | Payer: Medicare Other | Attending: Emergency Medicine | Admitting: Emergency Medicine

## 2021-06-24 ENCOUNTER — Encounter (HOSPITAL_BASED_OUTPATIENT_CLINIC_OR_DEPARTMENT_OTHER): Payer: Self-pay

## 2021-06-24 DIAGNOSIS — M2578 Osteophyte, vertebrae: Secondary | ICD-10-CM | POA: Diagnosis not present

## 2021-06-24 DIAGNOSIS — M542 Cervicalgia: Secondary | ICD-10-CM | POA: Diagnosis not present

## 2021-06-24 DIAGNOSIS — S3992XA Unspecified injury of lower back, initial encounter: Secondary | ICD-10-CM | POA: Diagnosis present

## 2021-06-24 DIAGNOSIS — X509XXA Other and unspecified overexertion or strenuous movements or postures, initial encounter: Secondary | ICD-10-CM | POA: Insufficient documentation

## 2021-06-24 DIAGNOSIS — S39012A Strain of muscle, fascia and tendon of lower back, initial encounter: Secondary | ICD-10-CM | POA: Diagnosis not present

## 2021-06-24 DIAGNOSIS — M47812 Spondylosis without myelopathy or radiculopathy, cervical region: Secondary | ICD-10-CM | POA: Diagnosis not present

## 2021-06-24 DIAGNOSIS — S29012A Strain of muscle and tendon of back wall of thorax, initial encounter: Secondary | ICD-10-CM | POA: Diagnosis not present

## 2021-06-24 MED ORDER — NAPROXEN 375 MG PO TABS: 375.0000 mg | ORAL_TABLET | Freq: Two times a day (BID) | ORAL | 0 refills | Status: DC

## 2021-06-24 MED ORDER — HYDROMORPHONE HCL 1 MG/ML IJ SOLN
1.0000 mg | Freq: Once | INTRAMUSCULAR | Status: AC
  Administered 2021-06-24: 1 mg via INTRAMUSCULAR
  Filled 2021-06-24: qty 1

## 2021-06-24 MED ORDER — CYCLOBENZAPRINE HCL 10 MG PO TABS: 10.0000 mg | ORAL_TABLET | Freq: Two times a day (BID) | ORAL | 0 refills | Status: DC | PRN

## 2021-06-24 NOTE — Discharge Instructions (Addendum)
Take the medications as needed for pain.  You can also try over-the-counter lidocaine patches such as Salonpas.  Try heat therapy and massage.  Consider following up with your primary doctor or chiropractor for further treatment if the symptoms persist

## 2021-06-24 NOTE — ED Provider Notes (Signed)
[facility] EMERGENCY DEPT Provider Note   CSN: [id number] Arrival date & time: 06/24/21  1512     History  Chief Complaint  Patient presents with  . Back Pain    Haley Freeman is a 46 y.o. female.   Back Pain   Patient has a history of chronic anemia, marijuana use, bipolar disorder, hyperlipidemia, back pain, joint pain who presents to the ED with complaints of back and neck pain.  Patient states that initially she started having symptoms after punching her bed.  She felt sort of a pop in her neck area.  She felt a sharp pain on the left side.  Went up towards her jaw.  This was about 3 weeks ago.  Symptoms have been somewhat waxing and waning however she started having sharp pain in her mid back radiating up towards the neck and shoulder region.  Patient has been trying over-the-counter ibuprofen but is not helping.  Today the symptoms were more severe and she cannot get comfortable.  She is not having any focal numbness or weakness.  She is not having any chest pain or shortness of breath.  No fevers chills or coughing.    Home Medications Prior to Admission medications   Medication Sig Start Date End Date Taking? Authorizing Provider  cyclobenzaprine (FLEXERIL) 10 MG tablet Take 1 tablet (10 mg total) by mouth 2 (two) times daily as needed for muscle spasms. 06/24/21  Yes Dorie Rank, MD  naproxen (NAPROSYN) 375 MG tablet Take 1 tablet (375 mg total) by mouth 2 (two) times daily. 06/24/21  Yes Dorie Rank, MD  amitriptyline (ELAVIL) 100 MG tablet Take 100 mg by mouth at bedtime.  02/22/19   [provider]  ondansetron (ZOFRAN) 4 MG tablet Take 1 tablet (4 mg total) by mouth every 8 (eight) hours as needed for nausea or vomiting. 04/10/20   Azzie Glatter, FNP  ferrous sulfate 325 (65 FE) MG tablet Take 1 tablet (325 mg total) by mouth daily with breakfast. 05/05/18 08/23/18  Shelly Coss, MD  pantoprazole (PROTONIX) 40 MG tablet Take 1 tablet (40 mg total)  by mouth daily. 04/24/18 08/23/18  Sherwood Gambler, MD  sucralfate (CARAFATE) 1 g tablet Take 1 tablet (1 g total) by mouth 4 (four) times daily -  with meals and at bedtime. 04/26/18 08/23/18  Julianne Rice, MD      Allergies    Penicillins and Strawberry (diagnostic)    Review of Systems   Review of Systems  Musculoskeletal:  Positive for back pain.    Physical Exam Updated Vital Signs BP 111/77 (BP Location: Right Arm)   Pulse 78   Temp 97.7 F (36.5 C)   Resp 14   Ht 1.727 m ('5\' 8"'$ )   LMP 06/23/2021   SpO2 100%   BMI 42.10 kg/m  Physical Exam Vitals and nursing note reviewed.  Constitutional:      Appearance: She is well-developed.     Comments: Appears to be in pain  HENT:     Head: Normocephalic and atraumatic.     Right Ear: External ear normal.     Left Ear: External ear normal.  Eyes:     General: No scleral icterus.       Right eye: No discharge.        Left eye: No discharge.     Conjunctiva/sclera: Conjunctivae normal.  Neck:     Trachea: No tracheal deviation.  Cardiovascular:     Rate and Rhythm: Normal rate  and regular rhythm.  Pulmonary:     Effort: Pulmonary effort is normal. No respiratory distress.     Breath sounds: Normal breath sounds. No stridor. No wheezing or rales.  Abdominal:     General: Bowel sounds are normal. There is no distension.     Palpations: Abdomen is soft.     Tenderness: There is no abdominal tenderness. There is no guarding or rebound.  Musculoskeletal:        General: Tenderness present. No deformity.     Cervical back: Neck supple. Spasms and tenderness present. No bony tenderness.     Thoracic back: Spasms and tenderness present.     Lumbar back: Normal.  Skin:    General: Skin is warm and dry.     Findings: No rash.  Neurological:     General: No focal deficit present.     Mental Status: She is alert.     Cranial Nerves: No cranial nerve deficit (no facial droop, extraocular movements intact, no slurred speech).      Sensory: No sensory deficit.     Motor: No abnormal muscle tone or seizure activity.     Coordination: Coordination normal.  Psychiatric:        Mood and Affect: Mood normal.     ED Results / Procedures / Treatments   Labs (all labs ordered are listed, but only abnormal results are displayed) Labs Reviewed - No data to display  EKG None  Radiology DG Cervical Spine Complete  Result Date: 06/24/2021 CLINICAL DATA:  Pain EXAM: CERVICAL SPINE - COMPLETE 4+ VIEW COMPARISON:  None Available. FINDINGS: On the lateral view the cervical spine is visualized to the level of C7. Normal cervical lordosis. Dens is well positioned between the lateral masses of C1. There is limited evaluation of the dens for acute fracture on the open-mouth view due to overlying osseous structures. No acute displaced fracture is detected.No aggressive-appearing focal osseous lesions. Pre-vertebral soft tissues are within normal limits. IMPRESSION: Negative cervical spine radiographs. Electronically Signed   By: Iven Finn M.D.   On: 06/24/2021 16:53   DG Thoracic Spine 2 View  Result Date: 06/24/2021 CLINICAL DATA:  back pain, neck pain EXAM: THORACIC SPINE 2 VIEWS COMPARISON:  None Available. FINDINGS: Limited evaluation due to overlapping osseous structures and overlying soft tissues. Twelve rib-bearing thoracic vertebral bodies. There is no evidence of thoracic spine fracture. Multilevel mild degenerative changes of the spine with osteophyte formation. Slight levocurvature of the midthoracic spine may be due to positioning. Otherwise alignment is normal. No other significant bone abnormalities are identified. IMPRESSION: No acute displaced fracture or traumatic listhesis of the thoracic spine. Limited evaluation due to overlapping osseous structures and overlying soft tissues. Electronically Signed   By: Iven Finn M.D.   On: 06/24/2021 16:47    Procedures Procedures    Medications Ordered in  ED Medications  HYDROmorphone (DILAUDID) injection 1 mg (1 mg Intramuscular Given 06/24/21 1610)    ED Course/ Medical Decision Making/ A&P Clinical Course as of 06/24/21 1725  Sun Jun 24, 2021  1652 DG Thoracic Spine 2 View No acute bony abnormalities thoracic spine [JK]  1724 DG Cervical Spine Complete C-spine films without acute findings [JK]    Clinical Course User Index [JK] Dorie Rank, MD                           Medical Decision Making Amount and/or Complexity of Data Reviewed Radiology: ordered. Decision-making  details documented in ED Course.  Risk Prescription drug management.   Patient presented to the emergency room for evaluation of back pain.  No signs of acute infection.  She has reproducible musculoskeletal tenderness.  No focal neurologic deficits.  X-rays do not show any signs of any acute bony abnormalities.  Suspect musculoskeletal strain and spasm.  Discussed symptomatic treatment.  Consider massage, PT, chiropractic care.  Follow-up PCP if not improving  Evaluation and diagnostic testing in the emergency department does not suggest an emergent condition requiring admission or immediate intervention beyond what has been performed at this time.  The patient is safe for discharge and has been instructed to return immediately for worsening symptoms, change in symptoms or any other concerns.          Final Clinical Impression(s) / ED Diagnoses Final diagnoses:  Back strain, initial encounter    Rx / DC Orders ED Discharge Orders          Ordered    naproxen (NAPROSYN) 375 MG tablet  2 times daily        06/24/21 1723    cyclobenzaprine (FLEXERIL) 10 MG tablet  2 times daily PRN        06/24/21 1723              Dorie Rank, MD 06/24/21 1725

## 2021-06-24 NOTE — ED Triage Notes (Addendum)
Patient arrives ambulatory to triage with complaints of back pain that has been getting worse x3 weeks. Patient reports she punched something 3 weeks ago and she has started having pains radiating from her back up to her neck. No urinary symptoms. Rates pain a 7/10

## 2021-08-15 ENCOUNTER — Encounter (INDEPENDENT_AMBULATORY_CARE_PROVIDER_SITE_OTHER): Payer: Self-pay

## 2021-09-24 ENCOUNTER — Ambulatory Visit (INDEPENDENT_AMBULATORY_CARE_PROVIDER_SITE_OTHER): Payer: Medicare Other | Admitting: Nurse Practitioner

## 2021-09-24 ENCOUNTER — Encounter: Payer: Self-pay | Admitting: Nurse Practitioner

## 2021-09-24 VITALS — BP 118/80 | HR 100 | Temp 98.2°F | Ht 68.0 in | Wt 203.0 lb

## 2021-09-24 DIAGNOSIS — Z0000 Encounter for general adult medical examination without abnormal findings: Secondary | ICD-10-CM | POA: Diagnosis not present

## 2021-09-24 DIAGNOSIS — K5900 Constipation, unspecified: Secondary | ICD-10-CM

## 2021-09-24 DIAGNOSIS — Z1231 Encounter for screening mammogram for malignant neoplasm of breast: Secondary | ICD-10-CM

## 2021-09-24 DIAGNOSIS — J302 Other seasonal allergic rhinitis: Secondary | ICD-10-CM

## 2021-09-24 DIAGNOSIS — Z1211 Encounter for screening for malignant neoplasm of colon: Secondary | ICD-10-CM

## 2021-09-24 LAB — POCT URINALYSIS DIP (CLINITEK)
Bilirubin, UA: NEGATIVE
Blood, UA: NEGATIVE
Glucose, UA: NEGATIVE mg/dL
Ketones, POC UA: NEGATIVE mg/dL
Leukocytes, UA: NEGATIVE
Nitrite, UA: NEGATIVE
POC PROTEIN,UA: 30 — AB
Spec Grav, UA: 1.025 (ref 1.010–1.025)
Urobilinogen, UA: 0.2 E.U./dL
pH, UA: 6 (ref 5.0–8.0)

## 2021-09-24 MED ORDER — POLYETHYLENE GLYCOL 3350 17 GM/SCOOP PO POWD: 17.0000 g | Freq: Two times a day (BID) | ORAL | 1 refills | Status: DC | PRN

## 2021-09-24 MED ORDER — FLUTICASONE PROPIONATE 50 MCG/ACT NA SUSP: 2.0000 | Freq: Every day | NASAL | 6 refills | Status: DC

## 2021-09-24 MED ORDER — CETIRIZINE HCL 10 MG PO TABS: 10.0000 mg | ORAL_TABLET | Freq: Every day | ORAL | 11 refills | Status: AC

## 2021-09-24 NOTE — Progress Notes (Signed)
$'@Patient'E$  ID: Haley Freeman, female    DOB: 08/03/1975, 46 y.o.   MRN: [id number]  Chief Complaint  Patient presents with   Annual Exam    Pt is here for physical and allergies has been bothering her also pt is unable to move her bowels pt is diagnosed with CVS    Referring provider: No ref. provider found  HPI  46 year old female with history of GERD, cyclic vomiting syndrome, anxiety, alcohol abuse, marijuana use, and obesity.   Patient presents for a physical today. States that she has been having issues with allergies and constipation. Patient has a long history of cyclic vomiting syndrome. Patient states that she already sees GI for this. She is due for mammogram and colonoscopy. Denies f/c/s, n/v/d, hemoptysis, PND, leg swelling Denies chest pain or edema      Allergies  Allergen Reactions   Penicillins Anaphylaxis    Swells throat. Has patient had a PCN reaction causing immediate rash, facial/tongue/throat swelling, SOB or lightheadedness with hypotension: Yes Has patient had a PCN reaction causing severe rash involving mucus membranes or skin necrosis: No Has patient had a PCN reaction that required hospitalization: Yes Has patient had a PCN reaction occurring within the last 10 years: No If all of the above answers are "NO", then may proceed with Cephalosporin use.    Strawberry (Diagnostic) Shortness Of Breath    Immunization History  Administered Date(s) Administered   Influenza,inj,Quad PF,6+ Mos 09/07/2018   PPD Test 11/03/2014, 11/11/2014    Past Medical History:  Diagnosis Date   Alcohol abuse    Anxiety    Back pain    Chronic anemia 05/04/2018   Constipation    CVS disease    Depression    Edema of both lower legs    Gallbladder problem    GERD (gastroesophageal reflux disease) 05/17/2019   Headache(784.0)    History of cholecystectomy 2019   Hypokalemia    Joint pain    Neuromuscular disorder ([place])    S/P removal of right ovary 08/2017   SOB  (shortness of breath)     Tobacco History: Social History   Tobacco Use  Smoking Status Former   Packs/day: 0.50   Types: Cigarettes   Quit date: 09/07/2009   Years since quitting: 12.0  Smokeless Tobacco Never   Counseling given: Not Answered   Outpatient Encounter Medications as of 09/24/2021  Medication Sig   cetirizine (ZYRTEC) 10 MG tablet Take 1 tablet (10 mg total) by mouth daily.   fluticasone (FLONASE) 50 MCG/ACT nasal spray Place 2 sprays into both nostrils daily.   polyethylene glycol powder (GLYCOLAX/MIRALAX) 17 GM/SCOOP powder Take 17 g by mouth 2 (two) times daily as needed.   amitriptyline (ELAVIL) 100 MG tablet Take 100 mg by mouth at bedtime.  (Patient not taking: Reported on 09/24/2021)   cyclobenzaprine (FLEXERIL) 10 MG tablet Take 1 tablet (10 mg total) by mouth 2 (two) times daily as needed for muscle spasms. (Patient not taking: Reported on 09/24/2021)   naproxen (NAPROSYN) 375 MG tablet Take 1 tablet (375 mg total) by mouth 2 (two) times daily. (Patient not taking: Reported on 09/24/2021)   ondansetron (ZOFRAN) 4 MG tablet Take 1 tablet (4 mg total) by mouth every 8 (eight) hours as needed for nausea or vomiting. (Patient not taking: Reported on 09/24/2021)   [DISCONTINUED] ferrous sulfate 325 (65 FE) MG tablet Take 1 tablet (325 mg total) by mouth daily with breakfast.   [DISCONTINUED] pantoprazole (PROTONIX) 40  MG tablet Take 1 tablet (40 mg total) by mouth daily.   [DISCONTINUED] sucralfate (CARAFATE) 1 g tablet Take 1 tablet (1 g total) by mouth 4 (four) times daily -  with meals and at bedtime.   No facility-administered encounter medications on file as of 09/24/2021.     Review of Systems  Review of Systems  Constitutional: Negative.   HENT: Negative.    Cardiovascular: Negative.   Gastrointestinal: Negative.   Allergic/Immunologic: Negative.   Neurological: Negative.   Psychiatric/Behavioral: Negative.         Physical Exam  BP 118/80 (BP  Location: Left Arm, Patient Position: Sitting, Cuff Size: Large)   Pulse 100   Temp 98.2 F (36.8 C)   Ht '5\' 8"'$  (1.727 m)   Wt 203 lb (92.1 kg)   SpO2 100%   BMI 30.87 kg/m   Wt Readings from Last 5 Encounters:  09/24/21 203 lb (92.1 kg)  05/03/20 276 lb 14.4 oz (125.6 kg)  04/10/20 277 lb (125.6 kg)  11/10/19 274 lb (124.3 kg)  03/16/19 (!) 302 lb 9.6 oz (137.3 kg)     Physical Exam Vitals and nursing note reviewed.  Constitutional:      General: She is not in acute distress.    Appearance: She is well-developed.  Cardiovascular:     Rate and Rhythm: Normal rate and regular rhythm.  Pulmonary:     Effort: Pulmonary effort is normal.     Breath sounds: Normal breath sounds.  Neurological:     Mental Status: She is alert and oriented to person, place, and time.      Lab Results:  CBC    Component Value Date/Time   WBC 17.6 (H) 05/03/2020 0030   RBC 4.79 05/03/2020 0030   HGB 9.5 (L) 05/03/2020 0030   HGB 10.2 (L) 04/10/2020 1409   HCT 32.2 (L) 05/03/2020 0030   HCT 35.1 04/10/2020 1409   PLT 294 05/03/2020 0030   PLT 325 12/25/2017 1505   MCV 67.2 (L) 05/03/2020 0030   MCV 73 (L) 12/25/2017 1505   MCH 19.8 (L) 05/03/2020 0030   MCHC 29.5 (L) 05/03/2020 0030   RDW 19.4 (H) 05/03/2020 0030   RDW 20.0 (H) 12/25/2017 1505   LYMPHSABS 2.9 05/26/2018 1543   LYMPHSABS 4.0 (H) 12/25/2017 1505   MONOABS 0.5 05/26/2018 1543   EOSABS 0.2 05/26/2018 1543   EOSABS 0.5 (H) 12/25/2017 1505   BASOSABS 0.1 05/26/2018 1543   BASOSABS 0.1 12/25/2017 1505    BMET    Component Value Date/Time   NA 136 05/03/2020 0030   NA 142 06/03/2018 1401   K 3.5 05/03/2020 0030   CL 106 05/03/2020 0030   CO2 22 05/03/2020 0030   GLUCOSE 107 (H) 05/03/2020 0030   BUN 14 05/03/2020 0030   BUN 10 06/03/2018 1401   CREATININE 1.14 (H) 05/03/2020 0030   CALCIUM 8.9 05/03/2020 0030   GFRNONAA >60 05/03/2020 0030   GFRAA >60 07/14/2019 0529    BNP No results found for:  "BNP"  ProBNP No results found for: "PROBNP"  Imaging: No results found.   Assessment & Plan:   Healthcare maintenance - POCT URINALYSIS DIP (CLINITEK) - CBC - Comprehensive metabolic panel - TSH - Lipid Panel  2. Seasonal allergies  - cetirizine (ZYRTEC) 10 MG tablet; Take 1 tablet (10 mg total) by mouth daily.  Dispense: 30 tablet; Refill: 11 - fluticasone (FLONASE) 50 MCG/ACT nasal spray; Place 2 sprays into both nostrils daily.  Dispense: 16  g; Refill: 6  3. Constipation, unspecified constipation type  - polyethylene glycol powder (GLYCOLAX/MIRALAX) 17 GM/SCOOP powder; Take 17 g by mouth 2 (two) times daily as needed.  Dispense: 3350 g; Refill: 1  4. Encounter for screening mammogram for malignant neoplasm of breast  - MM Digital Screening  5. Colon cancer screening  - Ambulatory referral to Gastroenterology    Follow up:  Follow up in 6 months or sooner if needed     Fenton Foy, NP 09/24/2021

## 2021-09-24 NOTE — Patient Instructions (Signed)
1. Healthcare maintenance  - POCT URINALYSIS DIP (CLINITEK) - CBC - Comprehensive metabolic panel - TSH - Lipid Panel  2. Seasonal allergies  - cetirizine (ZYRTEC) 10 MG tablet; Take 1 tablet (10 mg total) by mouth daily.  Dispense: 30 tablet; Refill: 11 - fluticasone (FLONASE) 50 MCG/ACT nasal spray; Place 2 sprays into both nostrils daily.  Dispense: 16 g; Refill: 6  3. Constipation, unspecified constipation type  - polyethylene glycol powder (GLYCOLAX/MIRALAX) 17 GM/SCOOP powder; Take 17 g by mouth 2 (two) times daily as needed.  Dispense: 3350 g; Refill: 1  4. Encounter for screening mammogram for malignant neoplasm of breast  - MM Digital Screening  5. Colon cancer screening  - Ambulatory referral to Gastroenterology    Follow up:  Follow up in 6 months or sooner if needed

## 2021-09-24 NOTE — Assessment & Plan Note (Signed)
-   POCT URINALYSIS DIP (CLINITEK) - CBC - Comprehensive metabolic panel - TSH - Lipid Panel  2. Seasonal allergies  - cetirizine (ZYRTEC) 10 MG tablet; Take 1 tablet (10 mg total) by mouth daily.  Dispense: 30 tablet; Refill: 11 - fluticasone (FLONASE) 50 MCG/ACT nasal spray; Place 2 sprays into both nostrils daily.  Dispense: 16 g; Refill: 6  3. Constipation, unspecified constipation type  - polyethylene glycol powder (GLYCOLAX/MIRALAX) 17 GM/SCOOP powder; Take 17 g by mouth 2 (two) times daily as needed.  Dispense: 3350 g; Refill: 1  4. Encounter for screening mammogram for malignant neoplasm of breast  - MM Digital Screening  5. Colon cancer screening  - Ambulatory referral to Gastroenterology    Follow up:  Follow up in 6 months or sooner if needed

## 2021-09-25 LAB — COMPREHENSIVE METABOLIC PANEL
ALT: 11 IU/L (ref 0–32)
AST: 15 IU/L (ref 0–40)
Albumin/Globulin Ratio: 1.6 (ref 1.2–2.2)
Albumin: 4.6 g/dL (ref 3.9–4.9)
Alkaline Phosphatase: 103 IU/L (ref 44–121)
BUN/Creatinine Ratio: 12 (ref 9–23)
BUN: 12 mg/dL (ref 6–24)
Bilirubin Total: <0.2 mg/dL (ref 0.0–1.2)
CO2: 21 mmol/L (ref 20–29)
Calcium: 9.4 mg/dL (ref 8.7–10.2)
Chloride: 102 mmol/L (ref 96–106)
Creatinine, Ser: 1.02 mg/dL — ABNORMAL HIGH (ref 0.57–1.00)
Globulin, Total: 2.9 g/dL (ref 1.5–4.5)
Glucose: 81 mg/dL (ref 70–99)
Potassium: 4.1 mmol/L (ref 3.5–5.2)
Sodium: 138 mmol/L (ref 134–144)
Total Protein: 7.5 g/dL (ref 6.0–8.5)
eGFR: 69 mL/min/{1.73_m2} (ref >59)

## 2021-09-25 LAB — CBC
Hematocrit: 32.2 % — ABNORMAL LOW (ref 34.0–46.6)
Hemoglobin: 8.6 g/dL — ABNORMAL LOW (ref 11.1–15.9)
MCH: 17.4 pg — ABNORMAL LOW (ref 26.6–33.0)
MCHC: 26.7 g/dL — ABNORMAL LOW (ref 31.5–35.7)
MCV: 65 fL — ABNORMAL LOW (ref 79–97)
Platelets: 283 10*3/uL (ref 150–450)
RBC: 4.94 x10E6/uL (ref 3.77–5.28)
RDW: 20.6 % — ABNORMAL HIGH (ref 11.7–15.4)
WBC: 10.5 10*3/uL (ref 3.4–10.8)

## 2021-09-25 LAB — LIPID PANEL
Chol/HDL Ratio: 3.8 ratio (ref 0.0–4.4)
Cholesterol, Total: 207 mg/dL — ABNORMAL HIGH (ref 100–199)
HDL: 54 mg/dL (ref >39)
LDL Chol Calc (NIH): 129 mg/dL — ABNORMAL HIGH (ref 0–99)
Triglycerides: 137 mg/dL (ref 0–149)
VLDL Cholesterol Cal: 24 mg/dL (ref 5–40)

## 2021-09-25 LAB — TSH: TSH: 1.26 u[IU]/mL (ref 0.450–4.500)

## 2021-10-15 ENCOUNTER — Other Ambulatory Visit: Payer: Self-pay | Admitting: Nurse Practitioner

## 2021-10-15 DIAGNOSIS — Z1231 Encounter for screening mammogram for malignant neoplasm of breast: Secondary | ICD-10-CM

## 2021-11-26 ENCOUNTER — Ambulatory Visit: Payer: Medicare Other

## 2022-01-02 ENCOUNTER — Telehealth: Payer: Self-pay | Admitting: Nurse Practitioner

## 2022-01-02 NOTE — Telephone Encounter (Signed)
Opened in error

## 2022-01-02 NOTE — Telephone Encounter (Signed)
Left message for patient to call back and schedule Medicare Annual Wellness Visit (AWV).   Please offer to do virtually or by telephone.   Last AWV:  09/30/2020   Schedule at any time with Herman   30 minute appointment for Virtual or phone  45 minute appointment for Initial virtual/phone  Any questions, please contact me at [phone]   Thank you,   [facility]  Ambulatory Clinical Support for Care Management [place] [facility] ??[phone] or ??[phone]

## 2022-01-11 ENCOUNTER — Ambulatory Visit (INDEPENDENT_AMBULATORY_CARE_PROVIDER_SITE_OTHER): Payer: Medicare HMO

## 2022-01-11 VITALS — Ht 68.0 in | Wt 210.0 lb

## 2022-01-11 DIAGNOSIS — Z0000 Encounter for general adult medical examination without abnormal findings: Secondary | ICD-10-CM | POA: Diagnosis not present

## 2022-01-11 NOTE — Progress Notes (Signed)
Subjective:   Haley Freeman is a 47 y.o. female who presents for Medicare Annual (Subsequent) preventive examination.  I connected with  Haley Freeman on 01/11/22 by a audio enabled telemedicine application and verified that I am speaking with the correct person using two identifiers.  Patient Location: Home  Provider Location: Office/Clinic  I discussed the limitations of evaluation and management by telemedicine. The patient expressed understanding and agreed to proceed.  Review of Systems     Cardiac Risk Factors include: smoking/ tobacco exposure     Objective:    Today's Vitals   01/11/22 1212  Weight: 210 lb (95.3 kg)  Height: '5\' 8"'$  (1.727 m)   Body mass index is 31.93 kg/m.     01/11/2022   12:07 PM 09/30/2020    1:13 PM 05/03/2020   12:17 AM 07/14/2019    5:28 AM 08/27/2018    5:56 AM 05/29/2018    1:24 PM 05/26/2018   10:24 PM  Advanced Directives  Does Patient Have a Medical Advance Directive? No No No No No No No  Would patient like information on creating a medical advance directive? No - Patient declined Yes (Inpatient - patient defers creating a medical advance directive at this time - Information given) No - Patient declined No - Patient declined No - Patient declined No - Patient declined No - Guardian declined    Current Medications (verified) Outpatient Encounter Medications as of 01/11/2022  Medication Sig   polyethylene glycol powder (GLYCOLAX/MIRALAX) 17 GM/SCOOP powder Take 17 g by mouth 2 (two) times daily as needed.   cetirizine (ZYRTEC) 10 MG tablet Take 1 tablet (10 mg total) by mouth daily. (Patient not taking: Reported on 01/11/2022)   fluticasone (FLONASE) 50 MCG/ACT nasal spray Place 2 sprays into both nostrils daily. (Patient not taking: Reported on 01/11/2022)   [DISCONTINUED] amitriptyline (ELAVIL) 100 MG tablet Take 100 mg by mouth at bedtime.  (Patient not taking: Reported on 09/24/2021)   [DISCONTINUED] cyclobenzaprine (FLEXERIL) 10 MG  tablet Take 1 tablet (10 mg total) by mouth 2 (two) times daily as needed for muscle spasms. (Patient not taking: Reported on 09/24/2021)   [DISCONTINUED] ferrous sulfate 325 (65 FE) MG tablet Take 1 tablet (325 mg total) by mouth daily with breakfast.   [DISCONTINUED] naproxen (NAPROSYN) 375 MG tablet Take 1 tablet (375 mg total) by mouth 2 (two) times daily. (Patient not taking: Reported on 09/24/2021)   [DISCONTINUED] ondansetron (ZOFRAN) 4 MG tablet Take 1 tablet (4 mg total) by mouth every 8 (eight) hours as needed for nausea or vomiting. (Patient not taking: Reported on 09/24/2021)   [DISCONTINUED] pantoprazole (PROTONIX) 40 MG tablet Take 1 tablet (40 mg total) by mouth daily.   [DISCONTINUED] sucralfate (CARAFATE) 1 g tablet Take 1 tablet (1 g total) by mouth 4 (four) times daily -  with meals and at bedtime.   No facility-administered encounter medications on file as of 01/11/2022.    Allergies (verified) Penicillins and Strawberry (diagnostic)   History: Past Medical History:  Diagnosis Date   Alcohol abuse    Anxiety    Back pain    Chronic anemia 05/04/2018   Constipation    CVS disease    Depression    Edema of both lower legs    Gallbladder problem    GERD (gastroesophageal reflux disease) 05/17/2019   Headache(784.0)    History of cholecystectomy 2019   Hypokalemia    Joint pain    Neuromuscular disorder ([place])  S/P removal of right ovary 08/2017   SOB (shortness of breath)    Past Surgical History:  Procedure Laterality Date   CESAREAN SECTION  '99 and '08   x 2   CHOLECYSTECTOMY N/A 10/01/2017   Procedure: LAPAROSCOPIC CHOLECYSTECTOMY WITH POSSIBLE  INTRAOPERATIVE CHOLANGIOGRAM;  Surgeon: Stark Klein, MD;  Location: [facility];  Service: General;  Laterality: N/A;   ESOPHAGOGASTRODUODENOSCOPY N/A 09/16/2017   Procedure: ESOPHAGOGASTRODUODENOSCOPY (EGD) WITH PROPOFOL;  Surgeon: Clarene Essex, MD;  Location: [facility] ENDOSCOPY;  Service: Endoscopy;  Laterality: N/A;    LAPAROSCOPY Right 08/22/2017   Procedure: LAPAROSCOPY OPERATIVE removal of right ovary and right dermoid cyst;  Surgeon: Sloan Leiter, MD;  Location: [facility];  Service: Gynecology;  Laterality: Right;   Family History  Problem Relation Age of Onset   Hypertension Mother    High Cholesterol Mother    Heart disease Mother    Thyroid disease Mother    Depression Mother    Anxiety disorder Mother    Alcoholism Mother    Drug abuse Mother    Cancer Father    Depression Father    Alcohol abuse Father    Drug abuse Father    Social History   Socioeconomic History   Marital status: Divorced    Spouse name: Not on file   Number of children: Not on file   Years of education: Not on file   Highest education level: Not on file  Occupational History   Occupation: Student  Tobacco Use   Smoking status: Former    Packs/day: 0.50    Types: Cigarettes    Quit date: 09/07/2009    Years since quitting: 12.3   Smokeless tobacco: Never  Vaping Use   Vaping Use: Some days  Substance and Sexual Activity   Alcohol use: Not Currently   Drug use: Yes    Types: Other-see comments, Marijuana    Comment: Occasionally smokes marijuana    Sexual activity: Not Currently    Birth control/protection: None  Other Topics Concern   Not on file  Social History Narrative   Not on file   Social Determinants of Health   Financial Resource Strain: Low Risk  (01/11/2022)   Overall Financial Resource Strain (CARDIA)    Difficulty of Paying Living Expenses: Not hard at all  Food Insecurity: No Food Insecurity (01/11/2022)   Hunger Vital Sign    Worried About Running Out of Food in the Last Year: Never true    Ran Out of Food in the Last Year: Never true  Transportation Needs: No Transportation Needs (01/11/2022)   PRAPARE - Hydrologist (Medical): No    Lack of Transportation (Non-Medical): No  Physical Activity: Sufficiently Active (01/11/2022)   Exercise Vital Sign    Days of  Exercise per Week: 3 days    Minutes of Exercise per Session: 60 min  Stress: No Stress Concern Present (01/11/2022)   [place]    Feeling of Stress : Only a little  Social Connections: Moderately Isolated (01/11/2022)   Social Connection and Isolation Panel [NHANES]    Frequency of Communication with Friends and Family: More than three times a week    Frequency of Social Gatherings with Friends and Family: Three times a week    Attends Religious Services: More than 4 times per year    Active Member of Clubs or Organizations: No    Attends Archivist Meetings: Never  Marital Status: Divorced    Tobacco Counseling Counseling given: Not Answered   Clinical Intake:  Pre-visit preparation completed: Yes  Pain : No/denies pain  Diabetes: No  How often do you need to have someone help you when you read instructions, pamphlets, or other written materials from your doctor or pharmacy?: 1 - Never  Diabetic?No   Interpreter Needed?: No  Information entered by :: Denman George LPN   Activities of Daily Living    01/11/2022   12:08 PM  In your present state of health, do you have any difficulty performing the following activities:  Hearing? 0  Vision? 0  Difficulty concentrating or making decisions? 0  Walking or climbing stairs? 0  Dressing or bathing? 0  Doing errands, shopping? 0  Preparing Food and eating ? N  Using the Toilet? N  In the past six months, have you accidently leaked urine? N  Do you have problems with loss of bowel control? N  Managing your Medications? N  Managing your Finances? N  Housekeeping or managing your Housekeeping? N    Patient Care Team: Fenton Foy, NP as PCP - General (Pulmonary Disease) Vevelyn Francois, NP as Nurse Practitioner (Adult Health Nurse Practitioner) Dorna Bloom, MD as Consulting Physician (Gastroenterology)  Indicate any recent Medical  Services you may have received from other than [facility] providers in the past year (date may be approximate).     Assessment:   This is a routine wellness examination for Jenene.  Hearing/Vision screen Hearing Screening - Comments:: Denies hearing difficulties   Vision Screening - Comments:: up to date with routine eye exams    Dietary issues and exercise activities discussed: Current Exercise Habits: Home exercise routine, Type of exercise: calisthenics, Time (Minutes): 40, Frequency (Times/Week): 3, Weekly Exercise (Minutes/Week): 120, Intensity: Moderate   Goals Addressed   None    Depression Screen    01/11/2022   12:07 PM 09/24/2021    2:59 PM 09/30/2020    1:06 PM 11/10/2019    7:51 AM 03/16/2019    2:25 PM 09/07/2018    2:07 PM 06/03/2018    1:05 PM  PHQ 2/9 Scores  PHQ - 2 Score 0 0 2 4 0 0 0  PHQ- 9 Score  0 9 16       Fall Risk    01/11/2022   12:13 PM 09/24/2021    2:59 PM 09/30/2020    1:15 PM 03/16/2019    2:25 PM 09/07/2018    2:07 PM  Fall Risk   Falls in the past year? 1 0 0 0 0  Number falls in past yr: 0 0 0 0 0  Injury with Fall? 0 0 0 0 0  Follow up Falls evaluation completed;Education provided;Falls prevention discussed Falls evaluation completed       FALL RISK PREVENTION PERTAINING TO THE HOME:  Any stairs in or around the home? Yes  If so, are there any without handrails? No  Home free of loose throw rugs in walkways, pet beds, electrical cords, etc? Yes  Adequate lighting in your home to reduce risk of falls? Yes   ASSISTIVE DEVICES UTILIZED TO PREVENT FALLS:  Life alert? No  Use of a cane, walker or w/c? No  Grab bars in the bathroom? No  Shower chair or bench in shower? No  Elevated toilet seat or a handicapped toilet? No   TIMED UP AND GO:  Was the test performed? No Telephonic visit   Cognitive Function:  01/11/2022   12:08 PM 09/30/2020    1:23 PM  6CIT Screen  What Year? 0 points 0 points  What month? 0 points 0 points  What  time? 0 points 0 points  Count back from 20 0 points 0 points  Months in reverse 0 points 0 points  Repeat phrase 0 points 0 points  Total Score 0 points 0 points    Immunizations Immunization History  Administered Date(s) Administered   Influenza,inj,Quad PF,6+ Mos 09/07/2018   PPD Test 11/03/2014, 11/11/2014    TDAP status: Due, Education has been provided regarding the importance of this vaccine. Advised may receive this vaccine at local pharmacy or Health Dept. Aware to provide a copy of the vaccination record if obtained from local pharmacy or Health Dept. Verbalized acceptance and understanding.  Flu Vaccine status: Declined, Education has been provided regarding the importance of this vaccine but patient still declined. Advised may receive this vaccine at local pharmacy or Health Dept. Aware to provide a copy of the vaccination record if obtained from local pharmacy or Health Dept. Verbalized acceptance and understanding.  Pneumococcal vaccine status: Up to date  Covid-19 vaccine status: Information provided on how to obtain vaccines.   Qualifies for Shingles Vaccine? No     Screening Tests Health Maintenance  Topic Date Due   COVID-19 Vaccine (1) Never done   DTaP/Tdap/Td (1 - Tdap) Never done   COLONOSCOPY (Pts 45-47yr Insurance coverage will need to be confirmed)  Never done   PAP SMEAR-Modifier  07/11/2020   INFLUENZA VACCINE  04/07/2022 (Originally 08/07/2021)   Medicare Annual Wellness (AWV)  01/12/2023   Hepatitis C Screening  Completed   HIV Screening  Completed   HPV VACCINES  Aged Out    Health Maintenance  Health Maintenance Due  Topic Date Due   COVID-19 Vaccine (1) Never done   DTaP/Tdap/Td (1 - Tdap) Never done   COLONOSCOPY (Pts 45-483yrInsurance coverage will need to be confirmed)  Never done   PAP SMEAR-Modifier  07/11/2020    Colorectal cancer screening: Referral to GI placed patient will discuss with established GI doctor . Pt aware the  office will call re: appt.  Mammogram status: Ordered and scheduled for 01/22/22. Pt provided with contact info and advised to call to schedule appt.   Lung Cancer Screening: (Low Dose CT Chest recommended if Age 47-80ears, 30 pack-year currently smoking OR have quit w/in 15years.) does not qualify.   Additional Screening:  Hepatitis C Screening: does qualify; Completed 09/26/17  Vision Screening: Recommended annual ophthalmology exams for early detection of glaucoma and other disorders of the eye. Is the patient up to date with their annual eye exam?  Yes  Who is the provider or what is the name of the office in which the patient attends annual eye exams? Unable to provide doctor's name  If pt is not established with a provider, would they like to be referred to a provider to establish care? No .   Dental Screening: Recommended annual dental exams for proper oral hygiene  Community Resource Referral / Chronic Care Management: CRR required this visit?  No   CCM required this visit?  No      Plan:     I have personally reviewed and noted the following in the patient's chart:   Medical and social history Use of alcohol, tobacco or illicit drugs  Current medications and supplements including opioid prescriptions. Patient is not currently taking opioid prescriptions. Functional ability and status Nutritional  status Physical activity Advanced directives List of other physicians Hospitalizations, surgeries, and ER visits in previous 12 months Vitals Screenings to include cognitive, depression, and falls Referrals and appointments  In addition, I have reviewed and discussed with patient certain preventive protocols, quality metrics, and best practice recommendations. A written personalized care plan for preventive services as well as general preventive health recommendations were provided to patient.     Vanetta Mulders, [place]   07/13/7207   Due to this being a virtual  visit, the after visit summary with patients personalized plan was offered to patient via mail or my-chart.  Patient would like to access on my-chart  Nurse Notes: No concerns

## 2022-01-11 NOTE — Patient Instructions (Addendum)
Haley Freeman , Thank you for taking time to come for your Medicare Wellness Visit. I appreciate your ongoing commitment to your health goals. Please review the following plan we discussed and let me know if I can assist you in the future.   These are the goals we discussed:  Goals   None     This is a list of the screening recommended for you and due dates:  Health Maintenance  Topic Date Due   COVID-19 Vaccine (1) Never done   DTaP/Tdap/Td vaccine (1 - Tdap) Never done   Colon Cancer Screening  Never done   Pap Smear  07/11/2020   Flu Shot  08/07/2021   Medicare Annual Wellness Visit  01/12/2023   Hepatitis C Screening: USPSTF Recommendation to screen - Ages 18-79 yo.  Completed   HIV Screening  Completed   HPV Vaccine  Aged Out    Advanced directives: Forms are available if you choose in the future to pursue completion.  This is recommended in order to make sure that your health wishes are honored in the event that you are unable to verbalize them to the provider.    Conditions/risks identified: Aim for 30 minutes of exercise or brisk walking, 6-8 glasses of water, and 5 servings of fruits and vegetables each day.   Next appointment: Follow up in one year for your annual wellness visit.   Preventive Care 40-64 Years, Female Preventive care refers to lifestyle choices and visits with your health care provider that can promote health and wellness. What does preventive care include? A yearly physical exam. This is also called an annual well check. Dental exams once or twice a year. Routine eye exams. Ask your health care provider how often you should have your eyes checked. Personal lifestyle choices, including: Daily care of your teeth and gums. Regular physical activity. Eating a healthy diet. Avoiding tobacco and drug use. Limiting alcohol use. Practicing safe sex. Taking low-dose aspirin daily starting at age 3. Taking vitamin and mineral supplements as recommended by  your health care provider. What happens during an annual well check? The services and screenings done by your health care provider during your annual well check will depend on your age, overall health, lifestyle risk factors, and family history of disease. Counseling  Your health care provider may ask you questions about your: Alcohol use. Tobacco use. Drug use. Emotional well-being. Home and relationship well-being. Sexual activity. Eating habits. Work and work Statistician. Method of birth control. Menstrual cycle. Pregnancy history. Screening  You may have the following tests or measurements: Height, weight, and BMI. Blood pressure. Lipid and cholesterol levels. These may be checked every 5 years, or more frequently if you are over 32 years old. Skin check. Lung cancer screening. You may have this screening every year starting at age 48 if you have a 30-pack-year history of smoking and currently smoke or have quit within the past 15 years. Fecal occult blood test (FOBT) of the stool. You may have this test every year starting at age 49. Flexible sigmoidoscopy or colonoscopy. You may have a sigmoidoscopy every 5 years or a colonoscopy every 10 years starting at age 58. Hepatitis C blood test. Hepatitis B blood test. Sexually transmitted disease (STD) testing. Diabetes screening. This is done by checking your blood sugar (glucose) after you have not eaten for a while (fasting). You may have this done every 1-3 years. Mammogram. This may be done every 1-2 years. Talk to your health care provider about  when you should start having regular mammograms. This may depend on whether you have a family history of breast cancer. BRCA-related cancer screening. This may be done if you have a family history of breast, ovarian, tubal, or peritoneal cancers. Pelvic exam and Pap test. This may be done every 3 years starting at age 40. Starting at age 9, this may be done every 5 years if you have a Pap  test in combination with an HPV test. Bone density scan. This is done to screen for osteoporosis. You may have this scan if you are at high risk for osteoporosis. Discuss your test results, treatment options, and if necessary, the need for more tests with your health care provider. Vaccines  Your health care provider may recommend certain vaccines, such as: Influenza vaccine. This is recommended every year. Tetanus, diphtheria, and acellular pertussis (Tdap, Td) vaccine. You may need a Td booster every 10 years. Zoster vaccine. You may need this after age 2. Pneumococcal 13-valent conjugate (PCV13) vaccine. You may need this if you have certain conditions and were not previously vaccinated. Pneumococcal polysaccharide (PPSV23) vaccine. You may need one or two doses if you smoke cigarettes or if you have certain conditions. Talk to your health care provider about which screenings and vaccines you need and how often you need them. This information is not intended to replace advice given to you by your health care provider. Make sure you discuss any questions you have with your health care provider. Document Released: 01/20/2015 Document Revised: 09/13/2015 Document Reviewed: 10/25/2014 Elsevier Interactive Patient Education  2017 [place] Prevention in the Home Falls can cause injuries. They can happen to people of all ages. There are many things you can do to make your home safe and to help prevent falls. What can I do on the outside of my home? Regularly fix the edges of walkways and driveways and fix any cracks. Remove anything that might make you trip as you walk through a door, such as a raised step or threshold. Trim any bushes or trees on the path to your home. Use bright outdoor lighting. Clear any walking paths of anything that might make someone trip, such as rocks or tools. Regularly check to see if handrails are loose or broken. Make sure that both sides of any steps  have handrails. Any raised decks and porches should have guardrails on the edges. Have any leaves, snow, or ice cleared regularly. Use sand or salt on walking paths during winter. Clean up any spills in your garage right away. This includes oil or grease spills. What can I do in the bathroom? Use night lights. Install grab bars by the toilet and in the tub and shower. Do not use towel bars as grab bars. Use non-skid mats or decals in the tub or shower. If you need to sit down in the shower, use a plastic, non-slip stool. Keep the floor dry. Clean up any water that spills on the floor as soon as it happens. Remove soap buildup in the tub or shower regularly. Attach bath mats securely with double-sided non-slip rug tape. Do not have throw rugs and other things on the floor that can make you trip. What can I do in the bedroom? Use night lights. Make sure that you have a light by your bed that is easy to reach. Do not use any sheets or blankets that are too big for your bed. They should not hang down onto the floor. Have  a firm chair that has side arms. You can use this for support while you get dressed. Do not have throw rugs and other things on the floor that can make you trip. What can I do in the kitchen? Clean up any spills right away. Avoid walking on wet floors. Keep items that you use a lot in easy-to-reach places. If you need to reach something above you, use a strong step stool that has a grab bar. Keep electrical cords out of the way. Do not use floor polish or wax that makes floors slippery. If you must use wax, use non-skid floor wax. Do not have throw rugs and other things on the floor that can make you trip. What can I do with my stairs? Do not leave any items on the stairs. Make sure that there are handrails on both sides of the stairs and use them. Fix handrails that are broken or loose. Make sure that handrails are as long as the stairways. Check any carpeting to make  sure that it is firmly attached to the stairs. Fix any carpet that is loose or worn. Avoid having throw rugs at the top or bottom of the stairs. If you do have throw rugs, attach them to the floor with carpet tape. Make sure that you have a light switch at the top of the stairs and the bottom of the stairs. If you do not have them, ask someone to add them for you. What else can I do to help prevent falls? Wear shoes that: Do not have high heels. Have rubber bottoms. Are comfortable and fit you well. Are closed at the toe. Do not wear sandals. If you use a stepladder: Make sure that it is fully opened. Do not climb a closed stepladder. Make sure that both sides of the stepladder are locked into place. Ask someone to hold it for you, if possible. Clearly mark and make sure that you can see: Any grab bars or handrails. First and last steps. Where the edge of each step is. Use tools that help you move around (mobility aids) if they are needed. These include: Canes. Walkers. Scooters. Crutches. Turn on the lights when you go into a dark area. Replace any light bulbs as soon as they burn out. Set up your furniture so you have a clear path. Avoid moving your furniture around. If any of your floors are uneven, fix them. If there are any pets around you, be aware of where they are. Review your medicines with your doctor. Some medicines can make you feel dizzy. This can increase your chance of falling. Ask your doctor what other things that you can do to help prevent falls. This information is not intended to replace advice given to you by your health care provider. Make sure you discuss any questions you have with your health care provider. Document Released: 10/20/2008 Document Revised: 06/01/2015 Document Reviewed: 01/28/2014 Elsevier Interactive Patient Education  2017 [employer].

## 2022-01-22 ENCOUNTER — Ambulatory Visit: Payer: Medicare Other

## 2022-01-30 ENCOUNTER — Ambulatory Visit: Payer: Self-pay | Admitting: Nurse Practitioner

## 2022-03-11 ENCOUNTER — Telehealth: Payer: Medicare HMO | Admitting: Nurse Practitioner

## 2022-03-11 NOTE — Progress Notes (Signed)
Because you are having changes in your vision and this is not something we can properly assess virtually, I feel your condition warrants further evaluation and I recommend that you be seen in a face to face visit.   NOTE: There will be NO CHARGE for this eVisit   If you are having a true medical emergency please call 911.      For an urgent face to face visit, [facility] has eight urgent care centers for your convenience:   NEW!! [facility] [place] Get Driving Directions [id number] [address] [place], [place] [facility] [place] [facility] [place] Get Driving Directions [id number] [place], [place] [address]   [facility]) Get Driving Directions [id number] [address], [place] [address]  [place] [facility] [place] ([place]) Get Driving Directions [id number] [address] [place],  [place]  [address]  [facility] - at [place] Get Driving Directions  [id number] [phone] [address] [place],  [place] [address]   [facility] Get Driving Directions [id number] [place], [place], [place] [address]   [facility] Get Driving Directions  [id number] [address] [place], [place] [address]   [facility] [place] Get Driving Directions [id number] [address], [place],  [address]  Your MyChart E-visit questionnaire answers were reviewed by a board certified advanced clinical practitioner to complete your personal care plan based on your specific symptoms.  Thank you for using e-Visits.

## 2022-03-25 ENCOUNTER — Ambulatory Visit: Payer: Self-pay | Admitting: Nurse Practitioner

## 2022-04-19 ENCOUNTER — Telehealth: Payer: Medicare HMO | Admitting: Emergency Medicine

## 2022-04-19 DIAGNOSIS — R0981 Nasal congestion: Secondary | ICD-10-CM | POA: Diagnosis not present

## 2022-04-19 NOTE — Progress Notes (Signed)
Virtual Visit Consent   Haley Freeman, you are scheduled for a virtual visit with a [facility] provider today. Just as with appointments in the office, your consent must be obtained to participate. Your consent will be active for this visit and any virtual visit you may have with one of our providers in the next 365 days. If you have a MyChart account, a copy of this consent can be sent to you electronically.  As this is a virtual visit, video technology does not allow for your provider to perform a traditional examination. This may limit your provider's ability to fully assess your condition. If your provider identifies any concerns that need to be evaluated in person or the need to arrange testing (such as labs, EKG, etc.), we will make arrangements to do so. Although advances in technology are sophisticated, we cannot ensure that it will always work on either your end or our end. If the connection with a video visit is poor, the visit may have to be switched to a telephone visit. With either a video or telephone visit, we are not always able to ensure that we have a secure connection.  By engaging in this virtual visit, you consent to the provision of healthcare and authorize for your insurance to be billed (if applicable) for the services provided during this visit. Depending on your insurance coverage, you may receive a charge related to this service.  I need to obtain your verbal consent now. Are you willing to proceed with your visit today? Haley Freeman has provided verbal consent on 04/19/2022 for a virtual visit (video or telephone). Roxy Horseman, PA-C  Date: 04/19/2022 2:09 PM  Virtual Visit via Video Note   I, Roxy Horseman, connected with  Haley Freeman  ([id number], [birth year]) on 04/19/22 at  2:00 PM EDT by a video-enabled telemedicine application and verified that I am speaking with the correct person using two identifiers.  Location: Patient: Virtual Visit Location  Patient: Home Provider: Virtual Visit Location Provider: Home Office   I discussed the limitations of evaluation and management by telemedicine and the availability of in person appointments. The patient expressed understanding and agreed to proceed.    History of Present Illness: Haley Freeman is a 47 y.o. who identifies as a female who was assigned female at birth, and is being seen today for congestion.  States that she has clear thick mucous.  States that she has been having symptoms for about a month.  States that she had a recent sinus infection and took antibiotics about a week ago. Marland Kitchen  HPI: HPI  Problems:  Patient Active Problem List   Diagnosis Date Noted   Healthcare maintenance 09/24/2021   Morbid obesity 11/10/2019   GERD (gastroesophageal reflux disease) 05/17/2019   History of ETOH abuse 05/17/2019   Mixed hyperlipidemia 05/17/2019   Bipolar disorder with depression 03/18/2019   Cyst of left ovary 09/07/2018   Gastroesophageal reflux disease with esophagitis 06/04/2018   Chronic anemia 05/04/2018   Hypokalemia 09/27/2017   Elevated LFTs 09/27/2017   Abnormal biliary HIDA scan 09/27/2017   Marijuana abuse 09/27/2017   Intractable nausea and vomiting 09/26/2017   Cannabis hyperemesis syndrome concurrent with and due to cannabis abuse 09/16/2017   Dermoid 08/22/2017    Allergies:  Allergies  Allergen Reactions   Penicillins Anaphylaxis    Swells throat. Has patient had a PCN reaction causing immediate rash, facial/tongue/throat swelling, SOB or lightheadedness with hypotension: Yes Has patient had a PCN  reaction causing severe rash involving mucus membranes or skin necrosis: No Has patient had a PCN reaction that required hospitalization: Yes Has patient had a PCN reaction occurring within the last 10 years: No If all of the above answers are "NO", then may proceed with Cephalosporin use.    Strawberry (Diagnostic) Shortness Of Breath   Medications:  Current  Outpatient Medications:    cetirizine (ZYRTEC) 10 MG tablet, Take 1 tablet (10 mg total) by mouth daily. (Patient not taking: Reported on 01/11/2022), Disp: 30 tablet, Rfl: 11   fluticasone (FLONASE) 50 MCG/ACT nasal spray, Place 2 sprays into both nostrils daily. (Patient not taking: Reported on 01/11/2022), Disp: 16 g, Rfl: 6   polyethylene glycol powder (GLYCOLAX/MIRALAX) 17 GM/SCOOP powder, Take 17 g by mouth 2 (two) times daily as needed., Disp: 3350 g, Rfl: 1  Observations/Objective: Patient is well-developed, well-nourished in no acute distress.  Resting comfortably at home.  Head is normocephalic, atraumatic.  No labored breathing.  Speech is clear and coherent with logical content.  Patient is alert and oriented at baseline.    Assessment and Plan: 1. Sinus congestion   Discussed supportive care.  No fever and improved after antibiotic, but still have significant congestion.  Recommend OTC Mucinex and OTC antihistamine.  Call back if something worsens.  Follow Up Instructions: I discussed the assessment and treatment plan with the patient. The patient was provided an opportunity to ask questions and all were answered. The patient agreed with the plan and demonstrated an understanding of the instructions.  A copy of instructions were sent to the patient via MyChart unless otherwise noted below.     The patient was advised to call back or seek an in-person evaluation if the symptoms worsen or if the condition fails to improve as anticipated.  Time:  I spent 10 minutes with the patient via telehealth technology discussing the above problems/concerns.    Roxy Horseman, PA-C

## 2022-04-19 NOTE — Patient Instructions (Signed)
  Haley Freeman, thank you for joining Roxy Horseman, PA-C for today's virtual visit.  While this provider is not your primary care provider (PCP), if your PCP is located in our provider database this encounter information will be shared with them immediately following your visit.   A [facility] MyChart account gives you access to today's visit and all your visits, tests, and labs performed at [facility] " click here if you don't have a [facility] MyChart account or go to [email or web address]  Consent: (Patient) Haley Freeman provided verbal consent for this virtual visit at the beginning of the encounter.  Current Medications:  Current Outpatient Medications:    cetirizine (ZYRTEC) 10 MG tablet, Take 1 tablet (10 mg total) by mouth daily. (Patient not taking: Reported on 01/11/2022), Disp: 30 tablet, Rfl: 11   fluticasone (FLONASE) 50 MCG/ACT nasal spray, Place 2 sprays into both nostrils daily. (Patient not taking: Reported on 01/11/2022), Disp: 16 g, Rfl: 6   polyethylene glycol powder (GLYCOLAX/MIRALAX) 17 GM/SCOOP powder, Take 17 g by mouth 2 (two) times daily as needed., Disp: 3350 g, Rfl: 1   Medications ordered in this encounter:  No orders of the defined types were placed in this encounter.    *If you need refills on other medications prior to your next appointment, please contact your pharmacy*  Follow-Up: Call back or seek an in-person evaluation if the symptoms worsen or if the condition fails to improve as anticipated.  [facility] [phone]  Other Instructions    If you have been instructed to have an in-person evaluation today at a local Urgent Care facility, please use the link below. It will take you to a list of all of our available [facility], including address, phone number and hours of operation. Please do not delay care.  [facility]  If you or a family member do not have a primary care  provider, use the link below to schedule a visit and establish care. When you choose a [facility] primary care physician or advanced practice provider, you gain a long-term partner in health. Find a Primary Care Provider  Learn more about [facility]'s in-office and virtual care options: [facility] - Get Care Now

## 2022-04-29 ENCOUNTER — Telehealth: Payer: Medicare HMO

## 2022-04-30 ENCOUNTER — Telehealth: Payer: Medicare HMO | Admitting: Family Medicine

## 2022-04-30 ENCOUNTER — Other Ambulatory Visit: Payer: Self-pay | Admitting: Nurse Practitioner

## 2022-04-30 DIAGNOSIS — B9689 Other specified bacterial agents as the cause of diseases classified elsewhere: Secondary | ICD-10-CM | POA: Diagnosis not present

## 2022-04-30 DIAGNOSIS — J019 Acute sinusitis, unspecified: Secondary | ICD-10-CM | POA: Diagnosis not present

## 2022-04-30 MED ORDER — DOXYCYCLINE HYCLATE 100 MG PO TABS: 100.0000 mg | ORAL_TABLET | Freq: Two times a day (BID) | ORAL | 0 refills | Status: AC

## 2022-04-30 MED ORDER — FLUTICASONE PROPIONATE 50 MCG/ACT NA SUSP: 2.0000 | Freq: Every day | NASAL | 0 refills | Status: DC

## 2022-04-30 NOTE — Patient Instructions (Signed)
Haley Freeman, thank you for joining [name], NP for today's virtual visit.  While this provider is not your primary care provider (PCP), if your PCP is located in our provider database this encounter information will be shared with them immediately following your visit.   A [facility] MyChart account gives you access to today's visit and all your visits, tests, and labs performed at [facility] " click here if you don't have a [facility] MyChart account or go to [email or web address]  Consent: (Patient) Haley Freeman provided verbal consent for this virtual visit at the beginning of the encounter.  Current Medications:  Current Outpatient Medications:    doxycycline (VIBRA-TABS) 100 MG tablet, Take 1 tablet (100 mg total) by mouth 2 (two) times daily for 10 days., Disp: 20 tablet, Rfl: 0   fluticasone (FLONASE) 50 MCG/ACT nasal spray, Place 2 sprays into both nostrils daily., Disp: 16 g, Rfl: 0   cetirizine (ZYRTEC) 10 MG tablet, Take 1 tablet (10 mg total) by mouth daily. (Patient not taking: Reported on 01/11/2022), Disp: 30 tablet, Rfl: 11   polyethylene glycol powder (GLYCOLAX/MIRALAX) 17 GM/SCOOP powder, Take 17 g by mouth 2 (two) times daily as needed., Disp: 3350 g, Rfl: 1   Medications ordered in this encounter:  Meds ordered this encounter  Medications   doxycycline (VIBRA-TABS) 100 MG tablet    Sig: Take 1 tablet (100 mg total) by mouth 2 (two) times daily for 10 days.    Dispense:  20 tablet    Refill:  0    Order Specific Question:   Supervising Provider    Answer:   Merrilee Jansky [[id number]]   fluticasone (FLONASE) 50 MCG/ACT nasal spray    Sig: Place 2 sprays into both nostrils daily.    Dispense:  16 g    Refill:  0    Order Specific Question:   Supervising Provider    Answer:   Merrilee Jansky [id number]     *If you need refills on other medications prior to your next appointment, please contact your pharmacy*  Follow-Up: Call back  or seek an in-person evaluation if the symptoms worsen or if the condition fails to improve as anticipated.   Virtual Care [phone]  Other Instructions -Take meds as prescribed -Rest -Use a cool mist humidifier especially during the winter months when heat dries out the air. - Use saline nose sprays frequently to help soothe nasal passages and promote drainage. -Saline irrigations of the nose can be very helpful if done frequently.             * 4X daily for 1 week*             * Use of a nettie pot can be helpful with this.  *Follow directions with this* *Boiled or distilled water only -stay hydrated by drinking plenty of fluids - Keep thermostat turn down low to prevent drying out sinuses - For any cough or congestion- robitussin DM or Delsym as needed - For fever or aches or pains- take tylenol or ibuprofen as directed on bottle             * for fevers greater than 101 orally you may alternate ibuprofen and tylenol every 3 hours.  If you do not improve you will need a follow up visit in person.                  If you have been instructed to  have an in-person evaluation today at a local Urgent Care facility, please use the link below. It will take you to a list of all of our available [facility]gent [facility], including address, phone number and hours of operation. Please do not delay care.  [facility]  If you or a family member do not have a primary care provider, use the link below to schedule a visit and establish care. When you choose a [facility] primary care physician or advanced practice provider, you gain a long-term partner in health. Find a Primary Care Provider  Learn more about [facility]'s in-office and virtual care options: [facility]

## 2022-04-30 NOTE — Progress Notes (Signed)
Virtual Visit Consent   Haley Freeman, you are scheduled for a virtual visit with a [facility] provider today. Just as with appointments in the office, your consent must be obtained to participate. Your consent will be active for this visit and any virtual visit you may have with one of our providers in the next 365 days. If you have a MyChart account, a copy of this consent can be sent to you electronically.  As this is a virtual visit, video technology does not allow for your provider to perform a traditional examination. This may limit your provider's ability to fully assess your condition. If your provider identifies any concerns that need to be evaluated in person or the need to arrange testing (such as labs, EKG, etc.), we will make arrangements to do so. Although advances in technology are sophisticated, we cannot ensure that it will always work on either your end or our end. If the connection with a video visit is poor, the visit may have to be switched to a telephone visit. With either a video or telephone visit, we are not always able to ensure that we have a secure connection.  By engaging in this virtual visit, you consent to the provision of healthcare and authorize for your insurance to be billed (if applicable) for the services provided during this visit. Depending on your insurance coverage, you may receive a charge related to this service.  I need to obtain your verbal consent now. Are you willing to proceed with your visit today? Haley Freeman has provided verbal consent on 04/30/2022 for a virtual visit (video or telephone). [name], NP  Date: 04/30/2022 11:33 AM  Virtual Visit via Video Note   I, [name], connected with  Haley Freeman  ([id number], 12/24/1975) on 04/30/22 at 11:30 AM EDT by a video-enabled telemedicine application and verified that I am speaking with the correct person using two identifiers.  Location: Patient: Virtual Visit Location  Patient: Home Provider: Virtual Visit Location Provider: Home Office   I discussed the limitations of evaluation and management by telemedicine and the availability of in person appointments. The patient expressed understanding and agreed to proceed.    History of Present Illness: Haley Freeman is a 47 y.o. who identifies as a female who was assigned female at birth, and is being seen today for sinus infection  Onset was 04/18/22 days ago started with allergies and congestion Associated symptoms are strange, pressure in max sinus area, thicker mucus, sneezing Modifying factors are mucinex- made it worse, started nose spray helped, zytrec daily Denies chest pain, shortness of breath, fevers, chills, ear pain, sore throat  Exposure to sick contacts- unknown COVID test: neg   Problems:  Patient Active Problem List   Diagnosis Date Noted   Healthcare maintenance 09/24/2021   Morbid obesity 11/10/2019   GERD (gastroesophageal reflux disease) 05/17/2019   History of ETOH abuse 05/17/2019   Mixed hyperlipidemia 05/17/2019   Bipolar disorder with depression 03/18/2019   Cyst of left ovary 09/07/2018   Gastroesophageal reflux disease with esophagitis 06/04/2018   Chronic anemia 05/04/2018   Hypokalemia 09/27/2017   Elevated LFTs 09/27/2017   Abnormal biliary HIDA scan 09/27/2017   Marijuana abuse 09/27/2017   Intractable nausea and vomiting 09/26/2017   Cannabis hyperemesis syndrome concurrent with and due to cannabis abuse 09/16/2017   Dermoid 08/22/2017    Allergies:  Allergies  Allergen Reactions   Penicillins Anaphylaxis    Swells throat. Has patient  had a PCN reaction causing immediate rash, facial/tongue/throat swelling, SOB or lightheadedness with hypotension: Yes Has patient had a PCN reaction causing severe rash involving mucus membranes or skin necrosis: No Has patient had a PCN reaction that required hospitalization: Yes Has patient had a PCN reaction occurring within  the last 10 years: No If all of the above answers are "NO", then may proceed with Cephalosporin use.    Strawberry (Diagnostic) Shortness Of Breath   Medications:  Current Outpatient Medications:    cetirizine (ZYRTEC) 10 MG tablet, Take 1 tablet (10 mg total) by mouth daily. (Patient not taking: Reported on 01/11/2022), Disp: 30 tablet, Rfl: 11   fluticasone (FLONASE) 50 MCG/ACT nasal spray, Place 2 sprays into both nostrils daily. (Patient not taking: Reported on 01/11/2022), Disp: 16 g, Rfl: 6   polyethylene glycol powder (GLYCOLAX/MIRALAX) 17 GM/SCOOP powder, Take 17 g by mouth 2 (two) times daily as needed., Disp: 3350 g, Rfl: 1  Observations/Objective: Patient is well-developed, well-nourished in no acute distress.  Resting comfortably  at home.  Head is normocephalic, atraumatic.  No labored breathing.  Speech is clear and coherent with logical content.  Patient is alert and oriented at baseline.    Assessment and Plan:  1. Acute bacterial sinusitis  - doxycycline (VIBRA-TABS) 100 MG tablet; Take 1 tablet (100 mg total) by mouth 2 (two) times daily for 10 days.  Dispense: 20 tablet; Refill: 0 - fluticasone (FLONASE) 50 MCG/ACT nasal spray; Place 2 sprays into both nostrils daily.  Dispense: 16 g; Refill: 0  -Take meds as prescribed -Rest -Use a cool mist humidifier especially during the winter months when heat dries out the air. - Use saline nose sprays frequently to help soothe nasal passages and promote drainage. -Saline irrigations of the nose can be very helpful if done frequently.             * 4X daily for 1 week*             * Use of a nettie pot can be helpful with this.  *Follow directions with this* *Boiled or distilled water only -stay hydrated by drinking plenty of fluids - Keep thermostat turn down low to prevent drying out sinuses - For any cough or congestion- robitussin DM or Delsym as needed - For fever or aches or pains- take tylenol or ibuprofen as  directed on bottle             * for fevers greater than 101 orally you may alternate ibuprofen and tylenol every 3 hours.  If you do not improve you will need a follow up visit in person.              Reviewed side effects, risks and benefits of medication.    Patient acknowledged agreement and understanding of the plan.   Past Medical, Surgical, Social History, Allergies, and Medications have been Reviewed.     Follow Up Instructions: I discussed the assessment and treatment plan with the patient. The patient was provided an opportunity to ask questions and all were answered. The patient agreed with the plan and demonstrated an understanding of the instructions.  A copy of instructions were sent to the patient via MyChart unless otherwise noted below.     The patient was advised to call back or seek an in-person evaluation if the symptoms worsen or if the condition fails to improve as anticipated.  Time:  I spent 10 minutes with the patient via telehealth technology discussing the  above problems/concerns.    [name], NP

## 2022-10-07 ENCOUNTER — Telehealth: Payer: Medicare HMO | Admitting: Emergency Medicine

## 2022-10-07 ENCOUNTER — Telehealth: Payer: Medicare HMO

## 2022-10-07 DIAGNOSIS — J029 Acute pharyngitis, unspecified: Secondary | ICD-10-CM

## 2022-10-07 MED ORDER — AZITHROMYCIN 250 MG PO TABS: ORAL_TABLET | ORAL | 0 refills | Status: DC

## 2022-10-07 NOTE — Progress Notes (Signed)
E-Visit for Sore Throat - Strep Symptoms  We are sorry that you are not feeling well.  Here is how we plan to help!  Based on what you have shared with me it is likely that you have strep pharyngitis.  Strep pharyngitis is inflammation and infection in the back of the throat.  This is an infection cause by bacteria and is treated with antibiotics.  I have prescribed Azithromycin 250 mg two tablets today and then one daily for 4 additional days. For throat pain, we recommend over the counter oral pain relief medications such as acetaminophen or aspirin, or anti-inflammatory medications such as ibuprofen or naproxen sodium. Topical treatments such as oral throat lozenges or sprays may be used as needed. Strep infections are not as easily transmitted as other respiratory infections, however we still recommend that you avoid close contact with loved ones, especially the very young and elderly.  Remember to wash your hands thoroughly throughout the day as this is the number one way to prevent the spread of infection and wipe down door knobs and counters with disinfectant.   Home Care: Only take medications as instructed by your medical team. Complete the entire course of an antibiotic. Do not take these medications with alcohol. A steam or ultrasonic humidifier can help congestion.  You can place a towel over your head and breathe in the steam from hot water coming from a faucet. Avoid close contacts especially the very young and the elderly. Cover your mouth when you cough or sneeze. Always remember to wash your hands.  Get Help Right Away If: You develop worsening fever or sinus pain. You develop a severe head ache or visual changes. Your symptoms persist after you have completed your treatment plan.  Make sure you Understand these instructions. Will watch your condition. Will get help right away if you are not doing well or get worse.   Thank you for choosing an e-visit.  Your e-visit  answers were reviewed by a board certified advanced clinical practitioner to complete your personal care plan. Depending upon the condition, your plan could have included both over the counter or prescription medications.  Please review your pharmacy choice. Make sure the pharmacy is open so you can pick up prescription now. If there is a problem, you may contact your provider through [employer] and have the prescription routed to another pharmacy.  Your safety is important to [name]. If you have drug allergies check your prescription carefully.   For the next 24 hours you can use MyChart to ask questions about today's visit, request a non-urgent call back, or ask for a work or school excuse. You will get an email in the next two days asking about your experience. I hope that your e-visit has been valuable and will speed your recovery.  Approximately 5 minutes was used in reviewing the patient's chart, questionnaire, prescribing medications, and documentation.

## 2022-12-03 ENCOUNTER — Telehealth: Payer: Medicare HMO | Admitting: Physician Assistant

## 2022-12-03 DIAGNOSIS — J019 Acute sinusitis, unspecified: Secondary | ICD-10-CM | POA: Diagnosis not present

## 2022-12-03 DIAGNOSIS — B9689 Other specified bacterial agents as the cause of diseases classified elsewhere: Secondary | ICD-10-CM | POA: Diagnosis not present

## 2022-12-03 MED ORDER — DOXYCYCLINE HYCLATE 100 MG PO TABS: 100.0000 mg | ORAL_TABLET | Freq: Two times a day (BID) | ORAL | 0 refills | Status: DC

## 2022-12-03 MED ORDER — PREDNISONE 20 MG PO TABS: 40.0000 mg | ORAL_TABLET | Freq: Every day | ORAL | 0 refills | Status: DC

## 2022-12-03 NOTE — Patient Instructions (Signed)
Haley Freeman, thank you for joining Piedad Climes, PA-C for today's virtual visit.  While this provider is not your primary care provider (PCP), if your PCP is located in our provider database this encounter information will be shared with them immediately following your visit.   A [facility] MyChart account gives you access to today's visit and all your visits, tests, and labs performed at [facility] " click here if you don't have a [facility] MyChart account or go to [email or web address]  Consent: (Patient) Haley Freeman provided verbal consent for this virtual visit at the beginning of the encounter.  Current Medications:  Current Outpatient Medications:    cetirizine (ZYRTEC) 10 MG tablet, Take 1 tablet (10 mg total) by mouth daily. (Patient not taking: Reported on 01/11/2022), Disp: 30 tablet, Rfl: 11   Medications ordered in this encounter:  No orders of the defined types were placed in this encounter.    *If you need refills on other medications prior to your next appointment, please contact your pharmacy*  Follow-Up: Call back or seek an in-person evaluation if the symptoms worsen or if the condition fails to improve as anticipated.  [facility] [phone]  Other Instructions Please take antibiotic as directed.  Increase fluid intake.  Use Saline nasal spray.  Take a daily multivitamin. Stop the .  Place a humidifier in the bedroom.  Please call or return clinic if symptoms are not improving.  Sinusitis Sinusitis is redness, soreness, and swelling (inflammation) of the paranasal sinuses. Paranasal sinuses are air pockets within the bones of your face (beneath the eyes, the middle of the forehead, or above the eyes). In healthy paranasal sinuses, mucus is able to drain out, and air is able to circulate through them by way of your nose. However, when your paranasal sinuses are inflamed, mucus and air can become trapped. This can allow  bacteria and other germs to grow and cause infection. Sinusitis can develop quickly and last only a short time (acute) or continue over a long period (chronic). Sinusitis that lasts for more than 12 weeks is considered chronic.  CAUSES  Causes of sinusitis include: Allergies. Structural abnormalities, such as displacement of the cartilage that separates your nostrils (deviated septum), which can decrease the air flow through your nose and sinuses and affect sinus drainage. Functional abnormalities, such as when the small hairs (cilia) that line your sinuses and help remove mucus do not work properly or are not present. SYMPTOMS  Symptoms of acute and chronic sinusitis are the same. The primary symptoms are pain and pressure around the affected sinuses. Other symptoms include: Upper toothache. Earache. Headache. Bad breath. Decreased sense of smell and taste. A cough, which worsens when you are lying flat. Fatigue. Fever. Thick drainage from your nose, which often is green and may contain pus (purulent). Swelling and warmth over the affected sinuses. DIAGNOSIS  Your caregiver will perform a physical exam. During the exam, your caregiver may: Look in your nose for signs of abnormal growths in your nostrils (nasal polyps). Tap over the affected sinus to check for signs of infection. View the inside of your sinuses (endoscopy) with a special imaging device with a light attached (endoscope), which is inserted into your sinuses. If your caregiver suspects that you have chronic sinusitis, one or more of the following tests may be recommended: Allergy tests. Nasal culture A sample of mucus is taken from your nose and sent to a lab and screened for bacteria.  Nasal cytology A sample of mucus is taken from your nose and examined by your caregiver to determine if your sinusitis is related to an allergy. TREATMENT  Most cases of acute sinusitis are related to a viral infection and will resolve on  their own within 10 days. Sometimes medicines are prescribed to help relieve symptoms (pain medicine, decongestants, nasal steroid sprays, or saline sprays).  However, for sinusitis related to a bacterial infection, your caregiver will prescribe antibiotic medicines. These are medicines that will help kill the bacteria causing the infection.  Rarely, sinusitis is caused by a fungal infection. In theses cases, your caregiver will prescribe antifungal medicine. For some cases of chronic sinusitis, surgery is needed. Generally, these are cases in which sinusitis recurs more than 3 times per year, despite other treatments. HOME CARE INSTRUCTIONS  Drink plenty of water. Water helps thin the mucus so your sinuses can drain more easily. Use a humidifier. Inhale steam 3 to 4 times a day (for example, sit in the bathroom with the shower running). Apply a warm, moist washcloth to your face 3 to 4 times a day, or as directed by your caregiver. Use saline nasal sprays to help moisten and clean your sinuses. Take over-the-counter or prescription medicines for pain, discomfort, or fever only as directed by your caregiver. SEEK IMMEDIATE MEDICAL CARE IF: You have increasing pain or severe headaches. You have nausea, vomiting, or drowsiness. You have swelling around your face. You have vision problems. You have a stiff neck. You have difficulty breathing. MAKE SURE YOU:  Understand these instructions. Will watch your condition. Will get help right away if you are not doing well or get worse. Document Released: 12/24/2004 Document Revised: 03/18/2011 Document Reviewed: 01/08/2011 [facility] Patient Information 2014 [place], [place].    If you have been instructed to have an in-person evaluation today at a local Urgent Care facility, please use the link below. It will take you to a list of all of our available [facility], including address, phone number and hours of operation. Please do not delay  care.  [facility]  If you or a family member do not have a primary care provider, use the link below to schedule a visit and establish care. When you choose a [facility] primary care physician or advanced practice provider, you gain a long-term partner in health. Find a Primary Care Provider  Learn more about [facility] in-office and virtual care options: [facility] - Get Care Now

## 2022-12-03 NOTE — Progress Notes (Signed)
Virtual Visit Consent   [name], you are scheduled for a virtual visit with a [facility] provider today. Just as with appointments in the office, your consent must be obtained to participate. Your consent will be active for this visit and any virtual visit you may have with one of our providers in the next 365 days. If you have a MyChart account, a copy of this consent can be sent to you electronically.  As this is a virtual visit, video technology does not allow for your provider to perform a traditional examination. This may limit your provider's ability to fully assess your condition. If your provider identifies any concerns that need to be evaluated in person or the need to arrange testing (such as labs, EKG, etc.), we will make arrangements to do so. Although advances in technology are sophisticated, we cannot ensure that it will always work on either your end or our end. If the connection with a video visit is poor, the visit may have to be switched to a telephone visit. With either a video or telephone visit, we are not always able to ensure that we have a secure connection.  By engaging in this virtual visit, you consent to the provision of healthcare and authorize for your insurance to be billed (if applicable) for the services provided during this visit. Depending on your insurance coverage, you may receive a charge related to this service.  I need to obtain your verbal consent now. Are you willing to proceed with your visit today? Haley Freeman has provided verbal consent on 12/03/2022 for a virtual visit (video or telephone). Piedad Climes, [place]  Date: 12/03/2022 6:53 PM  Virtual Visit via Video Note   I, Piedad Climes, connected with  Haley Freeman  ([id number], July 09, 1975) on 12/03/22 at  7:00 PM EST by a video-enabled telemedicine application and verified that I am speaking with the correct person using two identifiers.  Location: Patient: Virtual Visit  Location Patient: Home Provider: Virtual Visit Location Provider: Home Office   I discussed the limitations of evaluation and management by telemedicine and the availability of in person appointments. The patient expressed understanding and agreed to proceed.    History of Present Illness: Haley Freeman is a 47 y.o. who identifies as a female who was assigned female at birth, and is being seen today for increased nasal congestion and discharge over the past several weeks, above baseline runny nose and congestion for her seasonal allergies. Continues to take her zyrtec daily and had been using Flonase nasal spray with only some relief. Notes some lymph node swelling in neck with mild sore throat.   HPI: HPI  Problems:  Patient Active Problem List   Diagnosis Date Noted   Healthcare maintenance 09/24/2021   Morbid obesity (HCC) 11/10/2019   GERD (gastroesophageal reflux disease) 05/17/2019   History of ETOH abuse 05/17/2019   Mixed hyperlipidemia 05/17/2019   Bipolar disorder with depression (HCC) 03/18/2019   Cyst of left ovary 09/07/2018   Gastroesophageal reflux disease with esophagitis 06/04/2018   Chronic anemia 05/04/2018   Hypokalemia 09/27/2017   Elevated LFTs 09/27/2017   Abnormal biliary HIDA scan 09/27/2017   Marijuana abuse 09/27/2017   Intractable nausea and vomiting 09/26/2017   Cannabis hyperemesis syndrome concurrent with and due to cannabis abuse (HCC) 09/16/2017   Dermoid 08/22/2017    Allergies:  Allergies  Allergen Reactions   Penicillins Anaphylaxis    Swells throat. Has patient had a PCN reaction  causing immediate rash, facial/tongue/throat swelling, SOB or lightheadedness with hypotension: Yes Has patient had a PCN reaction causing severe rash involving mucus membranes or skin necrosis: No Has patient had a PCN reaction that required hospitalization: Yes Has patient had a PCN reaction occurring within the last 10 years: No If all of the above answers are  "NO", then may proceed with Cephalosporin use.    Strawberry (Diagnostic) Shortness Of Breath   Medications:  Current Outpatient Medications:    doxycycline (VIBRA-TABS) 100 MG tablet, Take 1 tablet (100 mg total) by mouth 2 (two) times daily., Disp: 20 tablet, Rfl: 0   predniSONE (DELTASONE) 20 MG tablet, Take 2 tablets (40 mg total) by mouth daily with breakfast., Disp: 10 tablet, Rfl: 0   cetirizine (ZYRTEC) 10 MG tablet, Take 1 tablet (10 mg total) by mouth daily. (Patient not taking: Reported on 01/11/2022), Disp: 30 tablet, Rfl: 11  Observations/Objective: Patient is well-developed, well-nourished in no acute distress.  Resting comfortably at home.  Head is normocephalic, atraumatic.  No labored breathing. Speech is clear and coherent with logical content.  Patient is alert and oriented at baseline.   Assessment and Plan: 1. Acute bacterial sinusitis - doxycycline (VIBRA-TABS) 100 MG tablet; Take 1 tablet (100 mg total) by mouth 2 (two) times daily.  Dispense: 20 tablet; Refill: 0 - predniSONE (DELTASONE) 20 MG tablet; Take 2 tablets (40 mg total) by mouth daily with breakfast.  Dispense: 10 tablet; Refill: 0  Rx Doxycycline.  Increase fluids.  Rest.  Saline nasal spray.  Probiotic.  Mucinex as directed.  Humidifier in bedroom. Prednisone per orders. Switch Cetirizine for Xyzal.  Call or return to clinic if symptoms are not improving.   Follow Up Instructions: I discussed the assessment and treatment plan with the patient. The patient was provided an opportunity to ask questions and all were answered. The patient agreed with the plan and demonstrated an understanding of the instructions.  A copy of instructions were sent to the patient via MyChart unless otherwise noted below.   The patient was advised to call back or seek an in-person evaluation if the symptoms worsen or if the condition fails to improve as anticipated.    Piedad Climes, PA-C

## 2022-12-05 ENCOUNTER — Emergency Department (HOSPITAL_BASED_OUTPATIENT_CLINIC_OR_DEPARTMENT_OTHER)
Admission: EM | Admit: 2022-12-05 | Discharge: 2022-12-05 | Disposition: A | Discharge status: Home / Self Care | Payer: Medicare HMO | Attending: Emergency Medicine | Admitting: Emergency Medicine

## 2022-12-05 ENCOUNTER — Telehealth: Payer: Medicare HMO | Admitting: Physician Assistant

## 2022-12-05 ENCOUNTER — Other Ambulatory Visit: Payer: Self-pay

## 2022-12-05 ENCOUNTER — Encounter (HOSPITAL_BASED_OUTPATIENT_CLINIC_OR_DEPARTMENT_OTHER): Payer: Self-pay | Admitting: Emergency Medicine

## 2022-12-05 ENCOUNTER — Emergency Department (HOSPITAL_BASED_OUTPATIENT_CLINIC_OR_DEPARTMENT_OTHER): Payer: Medicare HMO

## 2022-12-05 DIAGNOSIS — R0981 Nasal congestion: Secondary | ICD-10-CM | POA: Diagnosis not present

## 2022-12-05 DIAGNOSIS — Z20822 Contact with and (suspected) exposure to covid-19: Secondary | ICD-10-CM | POA: Diagnosis not present

## 2022-12-05 DIAGNOSIS — J029 Acute pharyngitis, unspecified: Secondary | ICD-10-CM | POA: Insufficient documentation

## 2022-12-05 DIAGNOSIS — M7989 Other specified soft tissue disorders: Secondary | ICD-10-CM | POA: Insufficient documentation

## 2022-12-05 DIAGNOSIS — B9789 Other viral agents as the cause of diseases classified elsewhere: Secondary | ICD-10-CM

## 2022-12-05 LAB — CBC
HCT: 29.6 % — ABNORMAL LOW (ref 36.0–46.0)
Hemoglobin: 8.8 g/dL — ABNORMAL LOW (ref 12.0–15.0)
MCH: 19.6 pg — ABNORMAL LOW (ref 26.0–34.0)
MCHC: 29.7 g/dL — ABNORMAL LOW (ref 30.0–36.0)
MCV: 65.9 fL — ABNORMAL LOW (ref 80.0–100.0)
Platelets: 301 10*3/uL (ref 150–400)
RBC: 4.49 MIL/uL (ref 3.87–5.11)
RDW: 18.3 % — ABNORMAL HIGH (ref 11.5–15.5)
WBC: 12 10*3/uL — ABNORMAL HIGH (ref 4.0–10.5)
nRBC: 0 % (ref 0.0–0.2)

## 2022-12-05 LAB — COMPREHENSIVE METABOLIC PANEL
ALT: 8 U/L (ref 0–44)
AST: 21 U/L (ref 15–41)
Albumin: 4.2 g/dL (ref 3.5–5.0)
Alkaline Phosphatase: 90 U/L (ref 38–126)
Anion gap: 10 (ref 5–15)
BUN: 9 mg/dL (ref 6–20)
CO2: 21 mmol/L — ABNORMAL LOW (ref 22–32)
Calcium: 9.5 mg/dL (ref 8.9–10.3)
Chloride: 104 mmol/L (ref 98–111)
Creatinine, Ser: 0.8 mg/dL (ref 0.44–1.00)
GFR, Estimated: >60 mL/min (ref >60)
Glucose, Bld: 103 mg/dL — ABNORMAL HIGH (ref 70–99)
Potassium: 4.7 mmol/L (ref 3.5–5.1)
Sodium: 135 mmol/L (ref 135–145)
Total Bilirubin: 0.3 mg/dL (ref <1.2)
Total Protein: 7.7 g/dL (ref 6.5–8.1)

## 2022-12-05 LAB — RESP PANEL BY RT-PCR (RSV, FLU A&B, COVID)  RVPGX2
Influenza A by PCR: NEGATIVE
Influenza B by PCR: NEGATIVE
Resp Syncytial Virus by PCR: NEGATIVE
SARS Coronavirus 2 by RT PCR: NEGATIVE

## 2022-12-05 MED ORDER — FAMOTIDINE 20 MG PO TABS
20.0000 mg | ORAL_TABLET | Freq: Once | ORAL | Status: AC
  Administered 2022-12-05: 20 mg via ORAL
  Filled 2022-12-05: qty 1

## 2022-12-05 MED ORDER — CETIRIZINE-PSEUDOEPHEDRINE ER 5-120 MG PO TB12: 1.0000 | ORAL_TABLET | Freq: Every day | ORAL | 0 refills | Status: DC

## 2022-12-05 MED ORDER — MAGIC MOUTHWASH: 5.0000 mL | Freq: Four times a day (QID) | ORAL | 0 refills | Status: DC | PRN

## 2022-12-05 MED ORDER — TRIAMCINOLONE ACETONIDE 55 MCG/ACT NA AERO: 2.0000 | INHALATION_SPRAY | Freq: Every day | NASAL | 12 refills | Status: DC

## 2022-12-05 MED ORDER — DIPHENHYDRAMINE HCL 25 MG PO CAPS
50.0000 mg | ORAL_CAPSULE | Freq: Once | ORAL | Status: AC
  Administered 2022-12-05: 50 mg via ORAL
  Filled 2022-12-05: qty 2

## 2022-12-05 NOTE — ED Notes (Signed)
Patient transported to [facility] 

## 2022-12-05 NOTE — ED Triage Notes (Signed)
Pt c/o congestion and BLE swelling, pain when ambulating. Reports neck swelling. Currently taking abx and steroid. Took ibuprofen today. Endorses concern for allergic reaction. Speaking in complete sentences

## 2022-12-05 NOTE — Progress Notes (Signed)
The patient no-showed for appointment despite this provider waiting for at least 10 minutes from appointment time for patient to join. They will be marked as a NS for this appointment/time.   Patient had been advised to seek in person evaluation for swelling of knees and leg pain with recent URI for which she was started on Prednisone and Doxycycline 2 days ago.  Margaretann Loveless, PA-C

## 2022-12-05 NOTE — ED Provider Notes (Signed)
EMERGENCY DEPARTMENT AT [facility] Provider Note   CSN: [id number] Arrival date & time: 12/05/22  1221     History  Chief Complaint  Patient presents with   Leg Swelling    Haley Freeman is a 47 y.o. female.  HPI   47 year old female presents emergency department with complaints of nasal congestion, sore throat, bilateral lower extremity swelling.  Patient reports being placed on doxycycline as well as prednisone for treatment of bacterial sinusitis a couple of days ago.  Patient states that she woke up this morning with feelings of sore throat as well as lower extremity swelling.  Called the provider who told her to come to the emergency department given concern for allergic reaction.  Patient states that she has had doxycycline in the past as well as prednisone without any complications.  Denies any difficulty breathing/swallowing, rash, pruritus, abdominal pain, nausea, vomiting, chest pain, shortness of breath.  Past medical history significant for alcohol abuse, bilateral extremity edema, neuromuscular disorder, CVS disease, anxiety, GERD, bipolar, hyperlipidemia, hyperemesis cannabinoid syndrome  Home Medications Prior to Admission medications   Medication Sig Start Date End Date Taking? Authorizing Provider  cetirizine-pseudoephedrine (ZYRTEC-D) 5-120 MG tablet Take 1 tablet by mouth daily. 12/05/22  Yes Sherian Maroon A, PA  magic mouthwash SOLN Take 5 mLs by mouth 4 (four) times daily as needed for mouth pain. Suspension contains equal amounts of Maalox Extra Strength, nystatin, and diphenhydramine. 12/05/22  Yes Sherian Maroon A, PA  triamcinolone (NASACORT) 55 MCG/ACT AERO nasal inhaler Place 2 sprays into the nose daily. 12/05/22  Yes Sherian Maroon A, PA  cetirizine (ZYRTEC) 10 MG tablet Take 1 tablet (10 mg total) by mouth daily. Patient not taking: Reported on 01/11/2022 09/24/21   Ivonne Andrew, NP  doxycycline (VIBRA-TABS) 100 MG tablet Take  1 tablet (100 mg total) by mouth 2 (two) times daily. 12/03/22   Waldon Merl, PA-C  predniSONE (DELTASONE) 20 MG tablet Take 2 tablets (40 mg total) by mouth daily with breakfast. 12/03/22   Waldon Merl, PA-C  ferrous sulfate 325 (65 FE) MG tablet Take 1 tablet (325 mg total) by mouth daily with breakfast. 05/05/18 08/23/18  Burnadette Pop, MD  pantoprazole (PROTONIX) 40 MG tablet Take 1 tablet (40 mg total) by mouth daily. 04/24/18 08/23/18  Pricilla Loveless, MD  sucralfate (CARAFATE) 1 g tablet Take 1 tablet (1 g total) by mouth 4 (four) times daily -  with meals and at bedtime. 04/26/18 08/23/18  Loren Racer, MD      Allergies    Penicillins and Strawberry (diagnostic)    Review of Systems   Review of Systems  All other systems reviewed and are negative.   Physical Exam Updated Vital Signs BP (!) 160/94 (BP Location: Right Arm)   Pulse 84   Temp 98 F (36.7 C) (Oral)   Resp 18   LMP 11/14/2022   SpO2 100%  Physical Exam Vitals and nursing note reviewed.  Constitutional:      General: She is not in acute distress.    Appearance: She is well-developed.  HENT:     Head: Normocephalic and atraumatic.     Right Ear: Tympanic membrane normal.     Left Ear: Tympanic membrane normal.     Nose: Congestion present. No rhinorrhea.     Mouth/Throat:     Comments: Mild posterior pharyngeal erythema.  Uvula midline rise symmetric with phonation.  No sublingual or submandibular swelling.  No appreciable stridor.  Tonsils 1+ bilaterally without exudate. Eyes:     Conjunctiva/sclera: Conjunctivae normal.  Cardiovascular:     Rate and Rhythm: Normal rate and regular rhythm.     Heart sounds: No murmur heard. Pulmonary:     Effort: Pulmonary effort is normal. No respiratory distress.     Breath sounds: Normal breath sounds.  Abdominal:     Palpations: Abdomen is soft.     Tenderness: There is no abdominal tenderness.  Musculoskeletal:     Cervical back: Neck supple.      Right lower leg: No edema.     Left lower leg: No edema.     Comments: No obvious lower extremity edema.  Full range of motion bilateral hips, knees, ankles, digits.  Pedal pulses 2+ bilaterally.  Skin:    General: Skin is warm and dry.     Capillary Refill: Capillary refill takes less than 2 seconds.     Findings: No rash.  Neurological:     Mental Status: She is alert.  Psychiatric:        Mood and Affect: Mood normal.     ED Results / Procedures / Treatments   Labs (all labs ordered are listed, but only abnormal results are displayed) Labs Reviewed  COMPREHENSIVE METABOLIC PANEL - Abnormal; Notable for the following components:      Result Value   CO2 21 (*)    Glucose, Bld 103 (*)    All other components within normal limits  CBC - Abnormal; Notable for the following components:   WBC 12.0 (*)    Hemoglobin 8.8 (*)    HCT 29.6 (*)    MCV 65.9 (*)    MCH 19.6 (*)    MCHC 29.7 (*)    RDW 18.3 (*)    All other components within normal limits  RESP PANEL BY RT-PCR (RSV, FLU A&B, COVID)  RVPGX2  PREGNANCY, URINE    EKG None  Radiology US Venous Img Lower Bilateral  Result Date: 12/05/2022 CLINICAL DATA:  Swelling EXAM: Bilateral LOWER EXTREMITY VENOUS DOPPLER ULTRASOUND TECHNIQUE: Gray-scale sonography with compression, as well as color and duplex ultrasound, were performed to evaluate the deep venous system(s) from the level of the common femoral vein through the popliteal and proximal calf veins. COMPARISON:  None Available. FINDINGS: VENOUS Normal compressibility of the common femoral, superficial femoral, and popliteal veins, as well as the visualized calf veins. Visualized portions of profunda femoral vein and great saphenous vein unremarkable. No filling defects to suggest DVT on grayscale or color Doppler imaging. Doppler waveforms show normal direction of venous flow, normal respiratory plasticity and response to augmentation. OTHER None. Limitations: none  IMPRESSION: No evidence of bilateral lower extremity DVT. Electronically Signed   By: Karen Kays M.D.   On: 12/05/2022 14:18    Procedures Procedures    Medications Ordered in ED Medications  diphenhydrAMINE (BENADRYL) capsule 50 mg (50 mg Oral Given 12/05/22 1432)  famotidine (PEPCID) tablet 20 mg (20 mg Oral Given 12/05/22 1432)    ED Course/ Medical Decision Making/ A&P                                 Medical Decision Making Amount and/or Complexity of Data Reviewed Labs: ordered.  Risk OTC drugs.   This patient presents to the ED for concern of congestion, right lower extremity swelling, this involves an extensive number of treatment options, and is a complaint that carries  with it a high risk of complications and morbidity.  The differential diagnosis includes DVT, peripheral edema, hypoalbuminemia, hypoproteinemia, fracture, dislocation, compartment syndrome, COVID, flu, RSV, other   Co morbidities that complicate the patient evaluation  See HPI   Additional history obtained:  Additional history obtained from EMR External records from outside source obtained and reviewed including hospital records   Lab Tests:  I Ordered, and personally interpreted labs.  The pertinent results include: Mild decrease in bicarb of 21 but otherwise, electrolytes within normal limits.  No transaminitis.  No renal dysfunction.  Respiratory panel negative.  Slight leukocytosis of 12.  Evidence of microcytic anemia with hemoglobin of 8.8 of which is near patient's baseline.  Prior labs performed.  Platelets within normal range.   Imaging Studies ordered:  I ordered imaging studies including bilateral extremity ultrasound I independently visualized and interpreted imaging which showed no DVT I agree with the radiologist interpretation   Cardiac Monitoring: / EKG:  The patient was maintained on a cardiac monitor.  I personally viewed and interpreted the cardiac monitored which  showed an underlying rhythm of: Sinus rhythm   Consultations Obtained:  N/a   Problem List / ED Course / Critical interventions / Medication management  Lower extremity swelling, sore throat I ordered medication including Pepcid, Benadryl   Reevaluation of the patient after these medicines showed that the patient improved I have reviewed the patients home medicines and have made adjustments as needed   Social Determinants of Health:  Denies tobacco, licit drug use   Test / Admission - Considered:  Lower extremity swelling, sore throat Vitals signs significant for hypertension blood pressure 164/98. Otherwise within normal range and stable throughout visit. Laboratory/imaging studies significant for: See above 47 year old female presents emergency department with complaints of nasal congestion, sore throat as well as feelings of lower extremity swelling.  Symptoms regarding sore throat nasal congestion abdomen present for the past several days with reported feeling of her lower extremity swelling that occurred this morning.  On exam, patient without any appreciable lower extremity swelling/edema.  No pulse deficits to suggest ischemic limb.  No overlying skin changes concerning for secondary infectious process.  DVT ultrasound negative.  Labs reassuring.  No clinical evidence of lower extremity edema.  Patient without chest pain, shortness of breath, auscultatory rales, history of CHF; in the setting of no obvious lower extremity edema, very low suspicion for CHF.  Regarding sore throat, patient with evidence of posterior pharyngeal erythema without clinical evidence of Ludwig angina, Lemierre's disease, Ludwig angina, PTA.  Suspect the symptoms are likely secondary to viral URI with accompanied nasal congestion.  Will recommend symptomatic therapy as described in AVS.  Close follow-up with primary care recommended for reevaluation of symptoms.  Treatment plan discussed at length with  patient and she acknowledged understanding was agreeable to said plan.  Patient overall well-appearing, afebrile in no acute distress. Worrisome signs and symptoms were discussed with the patient, and the patient acknowledged understanding to return to the ED if noticed. Patient was stable upon discharge.          Final Clinical Impression(s) / ED Diagnoses Final diagnoses:  Sore throat (viral)  Nasal congestion    Rx / DC Orders ED Discharge Orders     None         Peter Garter, [place] 12/05/22 1559    Rondel Baton, MD 12/05/22 [phone]

## 2022-12-05 NOTE — Discharge Instructions (Addendum)
As discussed, workup today overall reassuring.  Ultrasound was negative for DVT.  Your blood work did show low red blood cells but she does not seem to be far off from your other blood test from a year ago.  Recommend follow-up with your primary care for reassessment of your blood values.  Recommend wearing compression leg socks for lower extremity swelling as well as elevating legs above level of your heart.  Please do not hesitate to return if the worrisome signs and symptoms we discussed become apparent.

## 2022-12-05 NOTE — ED Notes (Signed)
ED Provider at bedside. 

## 2022-12-17 ENCOUNTER — Ambulatory Visit: Payer: Self-pay | Admitting: Nurse Practitioner

## 2023-01-03 ENCOUNTER — Encounter: Payer: Self-pay | Admitting: Nurse Practitioner

## 2023-01-03 ENCOUNTER — Ambulatory Visit (INDEPENDENT_AMBULATORY_CARE_PROVIDER_SITE_OTHER): Payer: Medicare HMO | Admitting: Nurse Practitioner

## 2023-01-03 VITALS — BP 115/78 | HR 92 | Temp 98.7°F | Wt 262.8 lb

## 2023-01-03 DIAGNOSIS — J302 Other seasonal allergic rhinitis: Secondary | ICD-10-CM

## 2023-01-03 DIAGNOSIS — J069 Acute upper respiratory infection, unspecified: Secondary | ICD-10-CM | POA: Diagnosis not present

## 2023-01-03 DIAGNOSIS — Z13220 Encounter for screening for lipoid disorders: Secondary | ICD-10-CM

## 2023-01-03 DIAGNOSIS — F32A Depression, unspecified: Secondary | ICD-10-CM | POA: Diagnosis not present

## 2023-01-03 DIAGNOSIS — E038 Other specified hypothyroidism: Secondary | ICD-10-CM | POA: Diagnosis not present

## 2023-01-03 MED ORDER — PREDNISONE 20 MG PO TABS: 20.0000 mg | ORAL_TABLET | Freq: Every day | ORAL | 0 refills | Status: AC

## 2023-01-03 MED ORDER — FLUTICASONE PROPIONATE 50 MCG/ACT NA SUSP: 2.0000 | Freq: Every day | NASAL | 6 refills | Status: DC

## 2023-01-03 MED ORDER — ESCITALOPRAM OXALATE 10 MG PO TABS: 10.0000 mg | ORAL_TABLET | Freq: Every day | ORAL | 2 refills | Status: DC

## 2023-01-03 MED ORDER — AZITHROMYCIN 250 MG PO TABS: ORAL_TABLET | ORAL | 0 refills | Status: AC

## 2023-01-03 NOTE — Progress Notes (Signed)
Subjective   Patient ID: Haley Freeman, female    DOB: 10/29/1975, 47 y.o.   MRN: [id number]  Chief Complaint  Patient presents with   Nasal Congestion    Constant     Referring provider: Ivonne Andrew, NP  Haley Freeman is a 47 y.o. female with Past Medical History: No date: Alcohol abuse No date: Anxiety No date: Back pain 05/04/2018: Chronic anemia No date: Constipation No date: CVS disease No date: Depression No date: Edema of both lower legs No date: Gallbladder problem 05/17/2019: GERD (gastroesophageal reflux disease) No date: Headache(784.0) 2019: History of cholecystectomy No date: Hypokalemia No date: Joint pain No date: Neuromuscular disorder (HCC) 08/2017: S/P removal of right ovary No date: SOB (shortness of breath)   HPI  Patient presents today for follow-up visit.  She states that she does feel that she has a respiratory infection.  She has been having sinus pressure and pain with sore throat and sinus drainage.  Patient is also having depression.  She does see a Veterinary surgeon.  She does think that she may need to be on medication.  We will trial Lexapro.  We will also refer patient for psychiatry for medication management. Denies f/c/s, n/v/d, hemoptysis, PND, leg swelling Denies chest pain or edema     Allergies  Allergen Reactions   Penicillins Anaphylaxis    Swells throat. Has patient had a PCN reaction causing immediate rash, facial/tongue/throat swelling, SOB or lightheadedness with hypotension: Yes Has patient had a PCN reaction causing severe rash involving mucus membranes or skin necrosis: No Has patient had a PCN reaction that required hospitalization: Yes Has patient had a PCN reaction occurring within the last 10 years: No If all of the above answers are "NO", then may proceed with Cephalosporin use.    Strawberry (Diagnostic) Shortness Of Breath    Immunization History  Administered Date(s) Administered   Influenza,inj,Quad PF,6+  Mos 09/07/2018   PFIZER(Purple Top)SARS-COV-2 Vaccination 06/17/2020   PPD Test 11/03/2014, 11/11/2014    Tobacco History: Social History   Tobacco Use  Smoking Status Former   Current packs/day: 0.00   Types: Cigarettes   Quit date: 09/07/2009   Years since quitting: 13.3  Smokeless Tobacco Never   Counseling given: Not Answered   Outpatient Encounter Medications as of 01/03/2023  Medication Sig   azithromycin (ZITHROMAX) 250 MG tablet Take 2 tablets on day 1, then 1 tablet daily on days 2 through 5   cetirizine-pseudoephedrine (ZYRTEC-D) 5-120 MG tablet Take 1 tablet by mouth daily.   escitalopram (LEXAPRO) 10 MG tablet Take 1 tablet (10 mg total) by mouth daily.   fluticasone (FLONASE) 50 MCG/ACT nasal spray Place 2 sprays into both nostrils daily.   predniSONE (DELTASONE) 20 MG tablet Take 1 tablet (20 mg total) by mouth daily with breakfast for 5 days.   triamcinolone (NASACORT) 55 MCG/ACT AERO nasal inhaler Place 2 sprays into the nose daily.   cetirizine (ZYRTEC) 10 MG tablet Take 1 tablet (10 mg total) by mouth daily. (Patient not taking: Reported on 01/03/2023)   doxycycline (VIBRA-TABS) 100 MG tablet Take 1 tablet (100 mg total) by mouth 2 (two) times daily. (Patient not taking: Reported on 01/03/2023)   magic mouthwash SOLN Take 5 mLs by mouth 4 (four) times daily as needed for mouth pain. Suspension contains equal amounts of Maalox Extra Strength, nystatin, and diphenhydramine.   predniSONE (DELTASONE) 20 MG tablet Take 2 tablets (40 mg total) by mouth daily with breakfast. (Patient not taking:  Reported on 01/03/2023)   [DISCONTINUED] ferrous sulfate 325 (65 FE) MG tablet Take 1 tablet (325 mg total) by mouth daily with breakfast.   [DISCONTINUED] pantoprazole (PROTONIX) 40 MG tablet Take 1 tablet (40 mg total) by mouth daily.   [DISCONTINUED] sucralfate (CARAFATE) 1 g tablet Take 1 tablet (1 g total) by mouth 4 (four) times daily -  with meals and at bedtime.   No  facility-administered encounter medications on file as of 01/03/2023.    Review of Systems  Review of Systems  Constitutional: Negative.   HENT:  Positive for congestion, postnasal drip, sinus pressure, sinus pain and sore throat.   Cardiovascular: Negative.   Gastrointestinal: Negative.   Allergic/Immunologic: Negative.   Neurological: Negative.   Psychiatric/Behavioral: Negative.       Objective:   BP 115/78   Pulse 92   Temp 98.7 F (37.1 C)   Wt 262 lb 12.8 oz (119.2 kg)   LMP 11/14/2022   SpO2 100%   BMI 39.96 kg/m   Wt Readings from Last 5 Encounters:  01/03/23 262 lb 12.8 oz (119.2 kg)  01/11/22 210 lb (95.3 kg)  09/24/21 203 lb (92.1 kg)  05/03/20 276 lb 14.4 oz (125.6 kg)  04/10/20 277 lb (125.6 kg)     Physical Exam Vitals and nursing note reviewed.  Constitutional:      General: She is not in acute distress.    Appearance: She is well-developed.  Cardiovascular:     Rate and Rhythm: Normal rate and regular rhythm.  Pulmonary:     Effort: Pulmonary effort is normal.     Breath sounds: Normal breath sounds.  Neurological:     Mental Status: She is alert and oriented to person, place, and time.  Psychiatric:        Mood and Affect: Mood normal.        Behavior: Behavior normal.       Assessment & Plan:   Seasonal allergies -     Ambulatory referral to Allergy  Upper respiratory tract infection, unspecified type -     Fluticasone Propionate; Place 2 sprays into both nostrils daily.  Dispense: 16 g; Refill: 6 -     predniSONE; Take 1 tablet (20 mg total) by mouth daily with breakfast for 5 days.  Dispense: 5 tablet; Refill: 0 -     Azithromycin; Take 2 tablets on day 1, then 1 tablet daily on days 2 through 5  Dispense: 6 tablet; Refill: 0  Depression, unspecified depression type -     Ambulatory referral to Psychiatry -     Escitalopram Oxalate; Take 1 tablet (10 mg total) by mouth daily.  Dispense: 30 tablet; Refill: 2  TSH  (thyroid-stimulating hormone deficiency) -     TSH  Lipid screening -     CBC -     Comprehensive metabolic panel -     Lipid panel     Return in about 6 months (around 07/04/2023).   Ivonne Andrew, NP 01/03/2023

## 2023-01-03 NOTE — Patient Instructions (Signed)
1. Seasonal allergies (Primary)  - Ambulatory referral to Allergy  2. Upper respiratory tract infection, unspecified type  - fluticasone (FLONASE) 50 MCG/ACT nasal spray; Place 2 sprays into both nostrils daily.  Dispense: 16 g; Refill: 6 - predniSONE (DELTASONE) 20 MG tablet; Take 1 tablet (20 mg total) by mouth daily with breakfast for 5 days.  Dispense: 5 tablet; Refill: 0 - azithromycin (ZITHROMAX) 250 MG tablet; Take 2 tablets on day 1, then 1 tablet daily on days 2 through 5  Dispense: 6 tablet; Refill: 0  3. Depression, unspecified depression type  - Ambulatory referral to Psychiatry - escitalopram (LEXAPRO) 10 MG tablet; Take 1 tablet (10 mg total) by mouth daily.  Dispense: 30 tablet; Refill: 2  4. TSH (thyroid-stimulating hormone deficiency)  - TSH  5. Lipid screening  - CBC - Comprehensive metabolic panel - Lipid Panel  Follow up:  Follow up in 6 months

## 2023-01-04 LAB — LIPID PANEL
Chol/HDL Ratio: 3.7 {ratio} (ref 0.0–4.4)
Cholesterol, Total: 221 mg/dL — ABNORMAL HIGH (ref 100–199)
HDL: 60 mg/dL (ref >39)
LDL Chol Calc (NIH): 144 mg/dL — ABNORMAL HIGH (ref 0–99)
Triglycerides: 97 mg/dL (ref 0–149)
VLDL Cholesterol Cal: 17 mg/dL (ref 5–40)

## 2023-01-04 LAB — COMPREHENSIVE METABOLIC PANEL
ALT: 10 [IU]/L (ref 0–32)
AST: 17 [IU]/L (ref 0–40)
Albumin: 4.2 g/dL (ref 3.9–4.9)
Alkaline Phosphatase: 120 [IU]/L (ref 44–121)
BUN/Creatinine Ratio: 14 (ref 9–23)
BUN: 13 mg/dL (ref 6–24)
Bilirubin Total: <0.2 mg/dL (ref 0.0–1.2)
CO2: 20 mmol/L (ref 20–29)
Calcium: 9.3 mg/dL (ref 8.7–10.2)
Chloride: 103 mmol/L (ref 96–106)
Creatinine, Ser: 0.93 mg/dL (ref 0.57–1.00)
Globulin, Total: 2.9 g/dL (ref 1.5–4.5)
Glucose: 94 mg/dL (ref 70–99)
Potassium: 4 mmol/L (ref 3.5–5.2)
Sodium: 138 mmol/L (ref 134–144)
Total Protein: 7.1 g/dL (ref 6.0–8.5)
eGFR: 76 mL/min/{1.73_m2} (ref >59)

## 2023-01-04 LAB — CBC
Hematocrit: 32.9 % — ABNORMAL LOW (ref 34.0–46.6)
Hemoglobin: 9.3 g/dL — ABNORMAL LOW (ref 11.1–15.9)
MCH: 19.3 pg — ABNORMAL LOW (ref 26.6–33.0)
MCHC: 28.3 g/dL — ABNORMAL LOW (ref 31.5–35.7)
MCV: 68 fL — ABNORMAL LOW (ref 79–97)
Platelets: 337 10*3/uL (ref 150–450)
RBC: 4.82 x10E6/uL (ref 3.77–5.28)
RDW: 17.2 % — ABNORMAL HIGH (ref 11.7–15.4)
WBC: 11.4 10*3/uL — ABNORMAL HIGH (ref 3.4–10.8)

## 2023-01-04 LAB — TSH: TSH: 1.74 u[IU]/mL (ref 0.450–4.500)

## 2023-01-16 ENCOUNTER — Ambulatory Visit (INDEPENDENT_AMBULATORY_CARE_PROVIDER_SITE_OTHER): Payer: 59

## 2023-01-16 VITALS — Ht 68.0 in | Wt 262.0 lb

## 2023-01-16 DIAGNOSIS — Z0000 Encounter for general adult medical examination without abnormal findings: Secondary | ICD-10-CM | POA: Diagnosis not present

## 2023-01-16 DIAGNOSIS — Z1211 Encounter for screening for malignant neoplasm of colon: Secondary | ICD-10-CM

## 2023-01-16 DIAGNOSIS — Z1231 Encounter for screening mammogram for malignant neoplasm of breast: Secondary | ICD-10-CM

## 2023-01-16 NOTE — Patient Instructions (Signed)
 Haley Freeman , Thank you for taking time to come for your Medicare Wellness Visit. I appreciate your ongoing commitment to your health goals. Please review the following plan we discussed and let me know if I can assist you in the future.   Referrals/Orders/Follow-Ups/Clinician Recommendations:  An order has been placed for a Cologuard for you. They will mail you the kit with instructions on how to obtain the sample and send it back in to be tested. If you do not received your kit, please call our office and let us  know.   You have an order for:  []   2D Mammogram  [x]   3D Mammogram  []   Bone Density     Please call for appointment:   [facility] [address] [place], [place] [address] [phone]  Make sure to wear two-piece clothing.  No lotions powders or deodorants the day of the appointment Make sure to bring picture ID and insurance card.  Bring list of medications you are currently taking including any supplements.   Schedule your [place] screening mammogram through MyChart!   Log into your MyChart account.  Go to 'Visit' (or 'Appointments' if on mobile App) --> Schedule an Appointment  Under 'Select a Reason for Visit' choose the Mammogram Screening option.  Complete the pre-visit questions and select the time and place that best fits your schedule.  Next Medicare Annual Wellness Visit: January 22, 2024 at 9:20 am virtual visit   This is a list of the screening recommended for you and due dates:  Health Maintenance  Topic Date Due   Mammogram  Never done   DTaP/Tdap/Td vaccine (1 - Tdap) Never done   Cologuard (Stool DNA test)  Never done   Pap with HPV screening  07/11/2020   COVID-19 Vaccine (2 - 2024-25 season) 09/08/2022   Flu Shot  04/07/2023*   Hepatitis C Screening  Completed   HIV Screening  Completed   HPV Vaccine  Aged Out  *Topic was postponed. The date shown is not the original due date.    Advanced directives:  (Provided) Advance directive discussed with you today. I have provided a copy for you to complete at home and have notarized. Once this is complete, please bring a copy in to our office so we can scan it into your chart.   Next Medicare Annual Wellness Visit scheduled for next year: Yes  Preventive Care 48-57 Years Old, Female Preventive care refers to lifestyle choices and visits with your health care provider that can promote health and wellness. Preventive care visits are also called wellness exams. What can I expect for my preventive care visit? Counseling Your health care provider may ask you questions about your: Medical history, including: Past medical problems. Family medical history. Pregnancy history. Current health, including: Menstrual cycle. Method of birth control. Emotional well-being. Home life and relationship well-being. Sexual activity and sexual health. Lifestyle, including: Alcohol , nicotine  or tobacco, and drug use. Access to firearms. Diet, exercise, and sleep habits. Work and work astronomer. Sunscreen use. Safety issues such as seatbelt and bike helmet use. Physical exam Your health care provider will check your: Height and weight. These may be used to calculate your BMI (body mass index). BMI is a measurement that tells if you are at a healthy weight. Waist circumference. This measures the distance around your waistline. This measurement also tells if you are at a healthy weight and may help predict your risk of certain diseases, such as type 2  diabetes and high blood pressure. Heart rate and blood pressure. Body temperature. Skin for abnormal spots. What immunizations do I need?  Vaccines are usually given at various ages, according to a schedule. Your health care provider will recommend vaccines for you based on your age, medical history, and lifestyle or other factors, such as travel or where you work. What tests do I need? Screening Your health  care provider may recommend screening tests for certain conditions. This may include: Lipid and cholesterol levels. Diabetes screening. This is done by checking your blood sugar (glucose) after you have not eaten for a while (fasting). Pelvic exam and Pap test. Hepatitis B test. Hepatitis C test. HIV (human immunodeficiency virus) test. STI (sexually transmitted infection) testing, if you are at risk. Lung cancer screening. Colorectal cancer screening. Mammogram. Talk with your health care provider about when you should start having regular mammograms. This may depend on whether you have a family history of breast cancer. BRCA-related cancer screening. This may be done if you have a family history of breast, ovarian, tubal, or peritoneal cancers. Bone density scan. This is done to screen for osteoporosis. Talk with your health care provider about your test results, treatment options, and if necessary, the need for more tests. Follow these instructions at home: Eating and drinking  Eat a diet that includes fresh fruits and vegetables, whole grains, lean protein, and low-fat dairy products. Take vitamin and mineral supplements as recommended by your health care provider. Do not drink alcohol  if: Your health care provider tells you not to drink. You are pregnant, may be pregnant, or are planning to become pregnant. If you drink alcohol : Limit how much you have to 0-1 drink a day. Know how much alcohol  is in your drink. In the [place] one drink equals one 12 oz bottle of beer (355 mL), one 5 oz glass of wine (148 mL), or one 1 oz glass of hard liquor (44 mL). Lifestyle Brush your teeth every morning and night with fluoride toothpaste. Floss one time each day. Exercise for at least 30 minutes 5 or more days each week. Do not use any products that contain nicotine  or tobacco. These products include cigarettes, chewing tobacco, and vaping devices, such as e-cigarettes. If you need help quitting,  ask your health care provider. Do not use drugs. If you are sexually active, practice safe sex. Use a condom or other form of protection to prevent STIs. If you do not wish to become pregnant, use a form of birth control. If you plan to become pregnant, see your health care provider for a prepregnancy visit. Take aspirin only as told by your health care provider. Make sure that you understand how much to take and what form to take. Work with your health care provider to find out whether it is safe and beneficial for you to take aspirin daily. Find healthy ways to manage stress, such as: Meditation, yoga, or listening to music. Journaling. Talking to a trusted person. Spending time with friends and family. Minimize exposure to UV radiation to reduce your risk of skin cancer. Safety Always wear your seat belt while driving or riding in a vehicle. Do not drive: If you have been drinking alcohol . Do not ride with someone who has been drinking. When you are tired or distracted. While texting. If you have been using any mind-altering substances or drugs. Wear a helmet and other protective equipment during sports activities. If you have firearms in your house, make sure you follow all  gun safety procedures. Seek help if you have been physically or sexually abused. What's next? Visit your health care provider once a year for an annual wellness visit. Ask your health care provider how often you should have your eyes and teeth checked. Stay up to date on all vaccines. This information is not intended to replace advice given to you by your health care provider. Make sure you discuss any questions you have with your health care provider. Document Revised: 06/21/2020 Document Reviewed: 06/21/2020 Elsevier Patient Education  2024 [employer].

## 2023-01-16 NOTE — Progress Notes (Signed)
 Because this visit was a virtual/telehealth visit,  certain criteria was not obtained, such a blood pressure, CBG if applicable, and timed get up and go. Any medications not marked as taking were not mentioned during the medication reconciliation part of the visit. Any vitals not documented were not able to be obtained Freeman to this being a telehealth visit or patient was unable to self-report a recent blood pressure reading Freeman to a lack of equipment at home via telehealth. Vitals that have been documented are verbally provided by the patient.   Interactive audio and video telecommunications were attempted between this provider and patient, however failed, Freeman to patient having technical difficulties OR patient did not have access to video capability.  We continued and completed visit with audio only.  Subjective:   Haley Freeman is a 48 y.o. female who presents for Medicare Annual (Subsequent) preventive examination.  Visit Complete: Virtual I connected with  Gerard JAYSON Dolly on 01/16/23 by a audio enabled telemedicine application and verified that I am speaking with the correct person using two identifiers.  Patient Location: Home  Provider Location: Home Office  I discussed the limitations of evaluation and management by telemedicine. The patient expressed understanding and agreed to proceed.  Vital Signs: Because this visit was a virtual/telehealth visit, some criteria may be missing or patient reported. Any vitals not documented were not able to be obtained and vitals that have been documented are patient reported.  Patient Medicare AWV questionnaire was completed by the patient on na; I have confirmed that all information answered by patient is correct and no changes since this date.  Cardiac Risk Factors include: obesity (BMI >30kg/m2);sedentary lifestyle     Objective:    Today's Vitals   01/16/23 1134  Weight: 262 lb (118.8 kg)  Height: 5' 8 (1.727 m)   Body mass index is  39.84 kg/m.     01/16/2023   11:33 AM 12/05/2022   12:59 PM 01/11/2022   12:07 PM 09/30/2020    1:13 PM 05/03/2020   12:17 AM 07/14/2019    5:28 AM 08/27/2018    5:56 AM  Advanced Directives  Does Patient Have a Medical Advance Directive? No No No No No No No  Would patient like information on creating a medical advance directive? No - Patient declined  No - Patient declined Yes (Inpatient - patient defers creating a medical advance directive at this time - Information given) No - Patient declined No - Patient declined No - Patient declined    Current Medications (verified) Outpatient Encounter Medications as of 01/16/2023  Medication Sig   cetirizine -pseudoephedrine  (ZYRTEC -D) 5-120 MG tablet Take 1 tablet by mouth daily.   escitalopram  (LEXAPRO ) 10 MG tablet Take 1 tablet (10 mg total) by mouth daily.   fluticasone  (FLONASE ) 50 MCG/ACT nasal spray Place 2 sprays into both nostrils daily.   magic mouthwash SOLN Take 5 mLs by mouth 4 (four) times daily as needed for mouth pain. Suspension contains equal amounts of Maalox Extra Strength, nystatin, and diphenhydramine .   triamcinolone  (NASACORT ) 55 MCG/ACT AERO nasal inhaler Place 2 sprays into the nose daily.   cetirizine  (ZYRTEC ) 10 MG tablet Take 1 tablet (10 mg total) by mouth daily. (Patient not taking: Reported on 01/11/2022)   doxycycline  (VIBRA -TABS) 100 MG tablet Take 1 tablet (100 mg total) by mouth 2 (two) times daily. (Patient not taking: Reported on 01/16/2023)   predniSONE  (DELTASONE ) 20 MG tablet Take 2 tablets (40 mg total) by mouth daily with breakfast. (  Patient not taking: Reported on 01/16/2023)   [DISCONTINUED] ferrous sulfate  325 (65 FE) MG tablet Take 1 tablet (325 mg total) by mouth daily with breakfast.   [DISCONTINUED] pantoprazole  (PROTONIX ) 40 MG tablet Take 1 tablet (40 mg total) by mouth daily.   [DISCONTINUED] sucralfate  (CARAFATE ) 1 g tablet Take 1 tablet (1 g total) by mouth 4 (four) times daily -  with meals and at  bedtime.   No facility-administered encounter medications on file as of 01/16/2023.    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   SOB (shortness of breath)    Past Surgical History:  Procedure Laterality Date   CESAREAN SECTION  '99 and '08   x 2   CHOLECYSTECTOMY N/A 10/01/2017   Procedure: LAPAROSCOPIC CHOLECYSTECTOMY WITH POSSIBLE  INTRAOPERATIVE CHOLANGIOGRAM;  Surgeon: Aron Shoulders, MD;  Location: [facility];  Service: General;  Laterality: N/A;   ESOPHAGOGASTRODUODENOSCOPY N/A 09/16/2017   Procedure: ESOPHAGOGASTRODUODENOSCOPY (EGD) WITH PROPOFOL ;  Surgeon: Rosalie Kitchens, MD;  Location: [facility] ENDOSCOPY;  Service: Endoscopy;  Laterality: N/A;   LAPAROSCOPY Right 08/22/2017   Procedure: LAPAROSCOPY OPERATIVE removal of right ovary and right dermoid cyst;  Surgeon: Nicholaus Burnard HERO, MD;  Location: [facility];  Service: Gynecology;  Laterality: Right;   Family History  Problem Relation Age of Onset   Hypertension Mother    High Cholesterol Mother    Heart disease Mother    Thyroid  disease Mother    Depression Mother    Anxiety disorder Mother    Alcoholism Mother    Drug abuse Mother    Cancer Father    Depression Father    Alcohol  abuse Father    Drug abuse Father    Social History   Socioeconomic History   Marital status: Divorced    Spouse name: Not on file   Number of children: Not on file   Years of education: Not on file   Highest education level: Not on file  Occupational History   Occupation: Student  Tobacco Use   Smoking status:  Former    Current packs/day: 0.00    Types: Cigarettes    Quit date: 09/07/2009    Years since quitting: 13.3   Smokeless tobacco: Never  Vaping Use   Vaping status: Every Day  Substance and Sexual Activity   Alcohol  use: Yes    Comment: occ   Drug use: Yes    Types: Other-see comments, Marijuana    Comment: Occasionally smokes marijuana    Sexual activity: Not Currently    Birth control/protection: None  Other Topics Concern   Not on file  Social History Narrative   Not on file   Social Drivers of Health   Financial Resource Strain: Low Risk  (01/16/2023)   Overall Financial Resource Strain (CARDIA)    Difficulty of Paying Living Expenses: Not hard at all  Food Insecurity: No Food Insecurity (01/16/2023)   Hunger Vital Sign    Worried About Running Out of  Food in the Last Year: Never true    Ran Out of Food in the Last Year: Never true  Transportation Needs: No Transportation Needs (01/16/2023)   PRAPARE - Administrator, Civil Service (Medical): No    Lack of Transportation (Non-Medical): No  Physical Activity: Sufficiently Active (01/16/2023)   Exercise Vital Sign    Days of Exercise per Week: 5 days    Minutes of Exercise per Session: 30 min  Stress: No Stress Concern Present (01/16/2023)   [employer] of Occupational Health - Occupational Stress Questionnaire    Feeling of Stress : Only a little  Social Connections: Moderately Integrated (01/16/2023)   Social Connection and Isolation Panel [NHANES]    Frequency of Communication with Friends and Family: More than three times a week    Frequency of Social Gatherings with Friends and Family: More than three times a week    Attends Religious Services: More than 4 times per year    Active Member of [employer] or Organizations: No    Attends Engineer, Structural: Never    Marital Status: Married    Tobacco Counseling Counseling given: Not Answered   Clinical Intake:  Pre-visit preparation completed:  Yes  Pain : No/denies pain     BMI - recorded: 39.84 Nutritional Risks: None Diabetes: No  How often do you need to have someone help you when you read instructions, pamphlets, or other written materials from your doctor or pharmacy?: 1 - Never  Interpreter Needed?: No  Information entered by :: Abria Vannostrand W, CMA   Activities of Daily Living    01/16/2023   11:52 AM  In your present state of health, do you have any difficulty performing the following activities:  Hearing? 0  Vision? 0  Difficulty concentrating or making decisions? 0  Walking or climbing stairs? 0  Dressing or bathing? 0  Doing errands, shopping? 0  Preparing Food and eating ? N  Using the Toilet? N  In the past six months, have you accidently leaked urine? N  Do you have problems with loss of bowel control? N  Managing your Medications? N  Managing your Finances? N  Housekeeping or managing your Housekeeping? N    Patient Care Team: Oley Bascom RAMAN, NP as PCP - General (Pulmonary Disease) Georgine Arlean FALCON, MD as Consulting Physician (Gastroenterology)  Indicate any recent Medical Services you may have received from other than [facility] providers in the past year (date may be approximate).     Assessment:   This is a routine wellness examination for Jamielee.  Hearing/Vision screen Hearing Screening - Comments:: Patient denies any hearing difficulties.   Vision Screening - Comments:: Denies any vision difficulties    Goals Addressed             This Visit's Progress    Patient Stated       Fleurette my classes for mental health counseling and lose weight        Depression Screen    01/16/2023   11:54 AM 01/11/2022   12:07 PM 09/24/2021    2:59 PM 09/30/2020    1:06 PM 11/10/2019    7:51 AM 03/16/2019    2:25 PM 09/07/2018    2:07 PM  PHQ 2/9 Scores  PHQ - 2 Score 0 0 0 2 4 0 0  PHQ- 9 Score   0 9 16      Fall Risk    01/16/2023   11:52 AM 01/11/2022   12:13  PM 09/24/2021    2:59 PM 09/30/2020    1:15  PM 03/16/2019    2:25 PM  Fall Risk   Falls in the past year? 0 1 0 0 0  Number falls in past yr: 0 0 0 0 0  Injury with Fall? 0 0 0 0 0  Risk for fall Freeman to : No Fall Risks      Follow up Falls prevention discussed Falls evaluation completed;Education provided;Falls prevention discussed Falls evaluation completed      MEDICARE RISK AT HOME: Medicare Risk at Home Any stairs in or around the home?: No If so, are there any without handrails?: No Home free of loose throw rugs in walkways, pet beds, electrical cords, etc?: Yes Adequate lighting in your home to reduce risk of falls?: Yes Life alert?: No Use of a cane, walker or w/c?: No Grab bars in the bathroom?: No Shower chair or bench in shower?: No Elevated toilet seat or a handicapped toilet?: No  TIMED UP AND GO:  Was the test performed?  No    Cognitive Function:        01/16/2023   11:38 AM 01/11/2022   12:08 PM 09/30/2020    1:23 PM  6CIT Screen  What Year? 0 points 0 points 0 points  What month? 0 points 0 points 0 points  What time? 0 points 0 points 0 points  Count back from 20 0 points 0 points 0 points  Months in reverse 0 points 0 points 0 points  Repeat phrase 0 points 0 points 0 points  Total Score 0 points 0 points 0 points    Immunizations Immunization History  Administered Date(s) Administered   Influenza,inj,Quad PF,6+ Mos 09/07/2018   PFIZER(Purple Top)SARS-COV-2 Vaccination 06/17/2020   PPD Test 11/03/2014, 11/11/2014    TDAP status: Freeman, Education has been provided regarding the importance of this vaccine. Advised may receive this vaccine at local pharmacy or Health Dept. Aware to provide a copy of the vaccination record if obtained from local pharmacy or Health Dept. Verbalized acceptance and understanding.  Flu Vaccine status: Declined, Education has been provided regarding the importance of this vaccine but patient still declined. Advised may receive this vaccine at local pharmacy or Health Dept.  Aware to provide a copy of the vaccination record if obtained from local pharmacy or Health Dept. Verbalized acceptance and understanding.  Pneumococcal vaccine status: Not age appropriate for this patient.    Covid-19 vaccine status: Information provided on how to obtain vaccines.   Qualifies for Shingles Vaccine? No   Not age appropriate  Screening Tests Health Maintenance  Topic Date Freeman   DTaP/Tdap/Td (1 - Tdap) Never done   Fecal DNA (Cologuard)  Never done   Cervical Cancer Screening (HPV/Pap Cotest)  07/11/2020   COVID-19 Vaccine (2 - 2024-25 season) 09/08/2022   INFLUENZA VACCINE  04/07/2023 (Originally 08/08/2022)   Hepatitis C Screening  Completed   HIV Screening  Completed   HPV VACCINES  Aged Out    Health Maintenance  Health Maintenance Freeman  Topic Date Freeman   DTaP/Tdap/Td (1 - Tdap) Never done   Fecal DNA (Cologuard)  Never done   Cervical Cancer Screening (HPV/Pap Cotest)  07/11/2020   COVID-19 Vaccine (2 - 2024-25 season) 09/08/2022    Colorectal Cancer Screening: Cologuard was ordered for patient today.    Mammogram status: Ordered 01/16/2023. Pt provided with contact info and advised to call to schedule appt.   Bone Density Screening: Not age appropriate for  this patient.   Lung Cancer Screening: (Low Dose CT Chest recommended if Age 83-80 years, 20 pack-year currently smoking OR have quit w/in 15years.) does not qualify.   Lung Cancer Screening Referral: na  Additional Screening:  Hepatitis C Screening: does not qualify; Completed   Vision Screening: Recommended annual ophthalmology exams for early detection of glaucoma and other disorders of the eye. Is the patient up to date with their annual eye exam?  Yes  Who is the provider or what is the name of the office in which the patient attends annual eye exams? [facility]   Dental Screening: Recommended annual dental exams for proper oral hygiene  Diabetic Foot Exam: na  Community Resource  Referral / Chronic Care Management: CRR required this visit?  No   CCM required this visit?  No     Plan:     I have personally reviewed and noted the following in the patient's chart:   Medical and social history Use of alcohol , tobacco or illicit drugs  Current medications and supplements including opioid prescriptions. Patient is not currently taking opioid prescriptions. Functional ability and status Nutritional status Physical activity Advanced directives List of other physicians Hospitalizations, surgeries, and ER visits in previous 12 months Vitals Screenings to include cognitive, depression, and falls Referrals and appointments  In addition, I have reviewed and discussed with patient certain preventive protocols, quality metrics, and best practice recommendations. A written personalized care plan for preventive services as well as general preventive health recommendations were provided to patient.     [place] LABOR Sophronia Varney, CMA   01/16/2023   After Visit Summary: (MyChart) Freeman to this being a telephonic visit, the after visit summary with patients personalized plan was offered to patient via MyChart

## 2023-01-23 ENCOUNTER — Telehealth: Payer: Medicaid Other | Admitting: Physician Assistant

## 2023-01-23 DIAGNOSIS — J329 Chronic sinusitis, unspecified: Secondary | ICD-10-CM | POA: Diagnosis not present

## 2023-01-23 MED ORDER — DOXYCYCLINE HYCLATE 100 MG PO TABS: 100.0000 mg | ORAL_TABLET | Freq: Two times a day (BID) | ORAL | 0 refills | Status: DC

## 2023-01-23 MED ORDER — PREDNISONE 20 MG PO TABS: 40.0000 mg | ORAL_TABLET | Freq: Every day | ORAL | 0 refills | Status: DC

## 2023-01-23 NOTE — Progress Notes (Signed)
Virtual Visit Consent   Haley Freeman, you are scheduled for a virtual visit with a Haley Freeman provider today. Just as with appointments in the office, your consent must be obtained to participate. Your consent will be active for this visit and any virtual visit you may have with one of our providers in the next 365 days. If you have a MyChart account, a copy of this consent can be sent to you electronically.  As this is a virtual visit, video technology does not allow for your provider to perform a traditional examination. This may limit your provider's ability to fully assess your condition. If your provider identifies any concerns that need to be evaluated in person or the need to arrange testing (such as labs, EKG, etc.), we will make arrangements to do so. Although advances in technology are sophisticated, we cannot ensure that it will always work on either your end or our end. If the connection with a video visit is poor, the visit may have to be switched to a telephone visit. With either a video or telephone visit, we are not always able to ensure that we have a secure connection.  By engaging in this virtual visit, you consent to the provision of healthcare and authorize for your insurance to be billed (if applicable) for the services provided during this visit. Depending on your insurance coverage, you may receive a charge related to this service.  I need to obtain your verbal consent now. Are you willing to proceed with your visit today? Haley Freeman has provided verbal consent on 01/23/2023 for a virtual visit (video or telephone). Haley Freeman, [place]  Date: 01/23/2023 5:27 PM  Virtual Visit via Video Note   I, Haley Freeman, connected with  Haley Freeman  ([id number], 1975-02-21) on 01/23/23 at  5:30 PM EST by a video-enabled telemedicine application and verified that I am speaking with the correct person using two identifiers.  Location: Patient: Virtual Visit  Location Patient: Home Provider: Virtual Visit Location Provider: Home Office   I discussed the limitations of evaluation and management by telemedicine and the availability of in person appointments. The patient expressed understanding and agreed to proceed.    History of Present Illness: Haley Freeman is a 48 y.o. who identifies as a female who was assigned female at birth, and is being seen today for nasal congestion, sinus pressure and sinus pain. Endorses symptoms starting over the past several days and progressively worsening since onset. Nasal discharge is white and green.  Notes anterior lymph node adenopathy.  Has an appointment with an allergist due to this reoccurring issue.   OTC -- Flonase, Zyrtec  HPI: HPI  Problems:  Patient Active Problem List   Diagnosis Date Noted   Healthcare maintenance 09/24/2021   Morbid obesity (HCC) 11/10/2019   GERD (gastroesophageal reflux disease) 05/17/2019   History of ETOH abuse 05/17/2019   Mixed hyperlipidemia 05/17/2019   Bipolar disorder with depression (HCC) 03/18/2019   Cyst of left ovary 09/07/2018   Gastroesophageal reflux disease with esophagitis 06/04/2018   Chronic anemia 05/04/2018   Hypokalemia 09/27/2017   Elevated LFTs 09/27/2017   Abnormal biliary HIDA scan 09/27/2017   Marijuana abuse 09/27/2017   Intractable nausea and vomiting 09/26/2017   Cannabis hyperemesis syndrome concurrent with and due to cannabis abuse (HCC) 09/16/2017   Dermoid 08/22/2017    Allergies:  Allergies  Allergen Reactions   Penicillins Anaphylaxis    Swells throat. Has patient had a PCN  reaction causing immediate rash, facial/tongue/throat swelling, SOB or lightheadedness with hypotension: Yes Has patient had a PCN reaction causing severe rash involving mucus membranes or skin necrosis: No Has patient had a PCN reaction that required hospitalization: Yes Has patient had a PCN reaction occurring within the last 10 years: No If all of the  above answers are "NO", then may proceed with Cephalosporin use.    Strawberry (Diagnostic) Shortness Of Breath   Medications:  Current Outpatient Medications:    doxycycline (VIBRA-TABS) 100 MG tablet, Take 1 tablet (100 mg total) by mouth 2 (two) times daily., Disp: 20 tablet, Rfl: 0   predniSONE (DELTASONE) 20 MG tablet, Take 2 tablets (40 mg total) by mouth daily with breakfast., Disp: 10 tablet, Rfl: 0   cetirizine (ZYRTEC) 10 MG tablet, Take 1 tablet (10 mg total) by mouth daily. (Patient not taking: Reported on 01/11/2022), Disp: 30 tablet, Rfl: 11   cetirizine-pseudoephedrine (ZYRTEC-D) 5-120 MG tablet, Take 1 tablet by mouth daily., Disp: 30 tablet, Rfl: 0   escitalopram (LEXAPRO) 10 MG tablet, Take 1 tablet (10 mg total) by mouth daily., Disp: 30 tablet, Rfl: 2   fluticasone (FLONASE) 50 MCG/ACT nasal spray, Place 2 sprays into both nostrils daily., Disp: 16 g, Rfl: 6   magic mouthwash SOLN, Take 5 mLs by mouth 4 (four) times daily as needed for mouth pain. Suspension contains equal amounts of Maalox Extra Strength, nystatin, and diphenhydramine., Disp: 200 mL, Rfl: 0   triamcinolone (NASACORT) 55 MCG/ACT AERO nasal inhaler, Place 2 sprays into the nose daily., Disp: 1 each, Rfl: 12  Observations/Objective: Patient is well-developed, well-nourished in no acute distress.  Resting comfortably at home.  Head is normocephalic, atraumatic.  No labored breathing. Speech is clear and coherent with logical content.  Patient is alert and oriented at baseline.   Assessment and Plan: 1. Recurrent sinusitis (Primary) - doxycycline (VIBRA-TABS) 100 MG tablet; Take 1 tablet (100 mg total) by mouth 2 (two) times daily.  Dispense: 20 tablet; Refill: 0 - predniSONE (DELTASONE) 20 MG tablet; Take 2 tablets (40 mg total) by mouth daily with breakfast.  Dispense: 10 tablet; Refill: 0  Rx Doxycycline.  Increase fluids.  Rest.  Saline nasal spray.  Probiotic.  Mucinex as directed.  Humidifier in  bedroom. Prednisone per orders. Continue allergy medications.  Call or return to clinic if symptoms are not improving.   Follow Up Instructions: I discussed the assessment and treatment plan with the patient. The patient was provided an opportunity to ask questions and all were answered. The patient agreed with the plan and demonstrated an understanding of the instructions.  A copy of instructions were sent to the patient via MyChart unless otherwise noted below.   The patient was advised to call back or seek an in-person evaluation if the symptoms worsen or if the condition fails to improve as anticipated.    Haley Climes, PA-C

## 2023-01-23 NOTE — Patient Instructions (Signed)
Haley Freeman, thank you for joining Piedad Climes, PA-C for today's virtual visit.  While this provider is not your primary care provider (PCP), if your PCP is located in our provider database this encounter information will be shared with them immediately following your visit.   A [facility] MyChart account gives you access to today's visit and all your visits, tests, and labs performed at [facility] " click here if you don't have a [facility] MyChart account or go to [email or web address]  Consent: (Patient) Haley Freeman provided verbal consent for this virtual visit at the beginning of the encounter.  Current Medications:  Current Outpatient Medications:    cetirizine (ZYRTEC) 10 MG tablet, Take 1 tablet (10 mg total) by mouth daily. (Patient not taking: Reported on 01/11/2022), Disp: 30 tablet, Rfl: 11   cetirizine-pseudoephedrine (ZYRTEC-D) 5-120 MG tablet, Take 1 tablet by mouth daily., Disp: 30 tablet, Rfl: 0   doxycycline (VIBRA-TABS) 100 MG tablet, Take 1 tablet (100 mg total) by mouth 2 (two) times daily. (Patient not taking: Reported on 01/16/2023), Disp: 20 tablet, Rfl: 0   escitalopram (LEXAPRO) 10 MG tablet, Take 1 tablet (10 mg total) by mouth daily., Disp: 30 tablet, Rfl: 2   fluticasone (FLONASE) 50 MCG/ACT nasal spray, Place 2 sprays into both nostrils daily., Disp: 16 g, Rfl: 6   magic mouthwash SOLN, Take 5 mLs by mouth 4 (four) times daily as needed for mouth pain. Suspension contains equal amounts of Maalox Extra Strength, nystatin, and diphenhydramine., Disp: 200 mL, Rfl: 0   predniSONE (DELTASONE) 20 MG tablet, Take 2 tablets (40 mg total) by mouth daily with breakfast. (Patient not taking: Reported on 01/16/2023), Disp: 10 tablet, Rfl: 0   triamcinolone (NASACORT) 55 MCG/ACT AERO nasal inhaler, Place 2 sprays into the nose daily., Disp: 1 each, Rfl: 12   Medications ordered in this encounter:  No orders of the defined types were placed in this  encounter.    *If you need refills on other medications prior to your next appointment, please contact your pharmacy*  Follow-Up: Call back or seek an in-person evaluation if the symptoms worsen or if the condition fails to improve as anticipated.  [facility] [phone]  Other Instructions Please take antibiotic as directed.  Increase fluid intake.  Use Saline nasal spray.  Take a daily multivitamin. Continue your allergy medications. Take the prednisone as directed.  Place a humidifier in the bedroom.  Please call or return clinic if symptoms are not improving.  Sinusitis Sinusitis is redness, soreness, and swelling (inflammation) of the paranasal sinuses. Paranasal sinuses are air pockets within the bones of your face (beneath the eyes, the middle of the forehead, or above the eyes). In healthy paranasal sinuses, mucus is able to drain out, and air is able to circulate through them by way of your nose. However, when your paranasal sinuses are inflamed, mucus and air can become trapped. This can allow bacteria and other germs to grow and cause infection. Sinusitis can develop quickly and last only a short time (acute) or continue over a long period (chronic). Sinusitis that lasts for more than 12 weeks is considered chronic.  CAUSES  Causes of sinusitis include: Allergies. Structural abnormalities, such as displacement of the cartilage that separates your nostrils (deviated septum), which can decrease the air flow through your nose and sinuses and affect sinus drainage. Functional abnormalities, such as when the small hairs (cilia) that line your sinuses and help remove mucus do  not work properly or are not present. SYMPTOMS  Symptoms of acute and chronic sinusitis are the same. The primary symptoms are pain and pressure around the affected sinuses. Other symptoms include: Upper toothache. Earache. Headache. Bad breath. Decreased sense of smell and taste. A cough, which  worsens when you are lying flat. Fatigue. Fever. Thick drainage from your nose, which often is green and may contain pus (purulent). Swelling and warmth over the affected sinuses. DIAGNOSIS  Your caregiver will perform a physical exam. During the exam, your caregiver may: Look in your nose for signs of abnormal growths in your nostrils (nasal polyps). Tap over the affected sinus to check for signs of infection. View the inside of your sinuses (endoscopy) with a special imaging device with a light attached (endoscope), which is inserted into your sinuses. If your caregiver suspects that you have chronic sinusitis, one or more of the following tests may be recommended: Allergy tests. Nasal culture A sample of mucus is taken from your nose and sent to a lab and screened for bacteria. Nasal cytology A sample of mucus is taken from your nose and examined by your caregiver to determine if your sinusitis is related to an allergy. TREATMENT  Most cases of acute sinusitis are related to a viral infection and will resolve on their own within 10 days. Sometimes medicines are prescribed to help relieve symptoms (pain medicine, decongestants, nasal steroid sprays, or saline sprays).  However, for sinusitis related to a bacterial infection, your caregiver will prescribe antibiotic medicines. These are medicines that will help kill the bacteria causing the infection.  Rarely, sinusitis is caused by a fungal infection. In theses cases, your caregiver will prescribe antifungal medicine. For some cases of chronic sinusitis, surgery is needed. Generally, these are cases in which sinusitis recurs more than 3 times per year, despite other treatments. HOME CARE INSTRUCTIONS  Drink plenty of water. Water helps thin the mucus so your sinuses can drain more easily. Use a humidifier. Inhale steam 3 to 4 times a day (for example, sit in the bathroom with the shower running). Apply a warm, moist washcloth to your face 3  to 4 times a day, or as directed by your caregiver. Use saline nasal sprays to help moisten and clean your sinuses. Take over-the-counter or prescription medicines for pain, discomfort, or fever only as directed by your caregiver. SEEK IMMEDIATE MEDICAL CARE IF: You have increasing pain or severe headaches. You have nausea, vomiting, or drowsiness. You have swelling around your face. You have vision problems. You have a stiff neck. You have difficulty breathing. MAKE SURE YOU:  Understand these instructions. Will watch your condition. Will get help right away if you are not doing well or get worse. Document Released: 12/24/2004 Document Revised: 03/18/2011 Document Reviewed: 01/08/2011 [facility] Patient Information 2014 [place], [place].    If you have been instructed to have an in-person evaluation today at a local Urgent Care facility, please use the link below. It will take you to a list of all of our available [facility], including address, phone number and hours of operation. Please do not delay care.  [facility]  If you or a family member do not have a primary care provider, use the link below to schedule a visit and establish care. When you choose a [facility] primary care physician or advanced practice provider, you gain a long-term partner in health. Find a Primary Care Provider  Learn more about [facility]'s in-office and  virtual care options: [facility] - Get Care Now

## 2023-01-25 ENCOUNTER — Other Ambulatory Visit: Payer: Self-pay | Admitting: Nurse Practitioner

## 2023-01-25 DIAGNOSIS — F32A Depression, unspecified: Secondary | ICD-10-CM

## 2023-02-05 ENCOUNTER — Other Ambulatory Visit: Payer: Self-pay

## 2023-02-05 ENCOUNTER — Encounter: Payer: Self-pay | Admitting: Internal Medicine

## 2023-02-05 ENCOUNTER — Ambulatory Visit (INDEPENDENT_AMBULATORY_CARE_PROVIDER_SITE_OTHER): Payer: 59 | Admitting: Internal Medicine

## 2023-02-05 VITALS — BP 126/74 | HR 90 | Temp 98.8°F | Ht 67.0 in | Wt 272.5 lb

## 2023-02-05 DIAGNOSIS — J3089 Other allergic rhinitis: Secondary | ICD-10-CM

## 2023-02-05 DIAGNOSIS — J393 Upper respiratory tract hypersensitivity reaction, site unspecified: Secondary | ICD-10-CM | POA: Diagnosis not present

## 2023-02-05 MED ORDER — LEVOCETIRIZINE DIHYDROCHLORIDE 5 MG PO TABS: 5.0000 mg | ORAL_TABLET | Freq: Every evening | ORAL | 5 refills | Status: DC

## 2023-02-05 MED ORDER — FLUTICASONE PROPIONATE 50 MCG/ACT NA SUSP: 2.0000 | Freq: Every day | NASAL | 6 refills | Status: AC

## 2023-02-05 NOTE — Progress Notes (Signed)
NEW PATIENT  Date of Service/Encounter:  02/05/23  Consult requested by: Ivonne Andrew, NP   Subjective:   Hillery Jacks (DOB: [birth year]) is a 48 y.o. female who presents to the clinic on 02/05/2023 with a chief complaint of Seasonal allergy, Nasal Congestion, and Food allergy (Strawberries) .    History obtained from: chart review and patient.  Rhinitis:  Started since she was younger. Recurring and worse over the last 6 months with multiple courses of antibiotics/prednisone without significant improvement.   Symptoms include: nasal congestion, rhinorrhea, post nasal drainage, and sneezing pressure, lots of mucous production   Occurs year-round with seasonal flares in Spring Potential triggers: pollens, dust mites  Treatments tried:  Zyrtec  Flonase  Previous allergy testing: yes; last was over 5 years ago.  History of sinus surgery: no Nonallergic triggers: none     Concern for Food Allergy:  Foods of concern: strawberry  History of reaction: throat closing, never been tested   Carries an epinephrine autoinjector: no    Reviewed:  01/22/2022: seen by Daphine Deutscher PA for recurrent sinusitis with sinus pressure, congestion, sinus pain, green/white discharge. Started on prednisone and doxycyline. Follow up with Allergy.  01/03/2023: seen by Tanda Rockers NP for seasonal allergies on Flonase, referred to Allergy. Also with URI started on azithromycin/prednisone.   03/10/2020: seen by [facility] for cyclical vomiting syndrome, responded to amitriptyline.    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   SOB (shortness of breath)     Past Surgical History: Past Surgical History:  Procedure Laterality Date   CESAREAN SECTION  '99 and '08   x 2   CHOLECYSTECTOMY N/A 10/01/2017   Procedure: LAPAROSCOPIC CHOLECYSTECTOMY WITH POSSIBLE  INTRAOPERATIVE CHOLANGIOGRAM;  Surgeon: Almond Lint, MD;  Location: [facility] ORS;  Service: General;  Laterality: N/A;   ESOPHAGOGASTRODUODENOSCOPY N/A 09/16/2017   Procedure: ESOPHAGOGASTRODUODENOSCOPY (EGD) WITH PROPOFOL;  Surgeon: Vida Rigger, MD;  [facility] END[facility];  Service: Endoscopy;  Laterality: N/A;   LAPAROSCOPY Right 08/22/2017   Procedure: LAPAROSCOPY OPERATIVE removal of right ovary and right dermoid cyst;  Surgeon: Conan Bowens, MD;  Location: [facility] ORS;  Service: Gynecology;  Laterality: Right;    Family History: Family History  Problem Relation Age of Onset   Hypertension Mother    High Cholesterol Mother    Heart disease Mother    Thyroid disease Mother    Depression Mother    Anxiety disorder Mother    Alcoholism Mother    Drug abuse Mother    Cancer Father    Depression Father    Alcohol abuse Father    Drug abuse Father    Asthma Maternal Aunt     Social History:  Flooring in bedroom: wood Pets: dog Tobacco use/exposure: vaping started 12/2018 Job: none  Medication List:  Allergies as of 02/05/2023       Reactions   Penicillins Anaphylaxis   Swells throat. Has patient had a PCN reaction causing immediate rash, facial/tongue/throat swelling, SOB or  lightheadedness with hypotension: Yes Has patient had a PCN reaction causing severe rash involving mucus membranes or skin necrosis: No Has patient had a PCN reaction that required hospitalization: Yes Has patient had a PCN reaction occurring within the last 10 years: No If all of the above answers are "NO", then may proceed with Cephalosporin use.   Strawberry (diagnostic) Shortness Of Breath        Medication List        Accurate as of February 05, 2023   1:56 PM. If you have any questions, ask your nurse or doctor.          STOP taking these medications    cetirizine-pseudoephedrine 5-120 MG tablet Commonly known as: ZYRTEC-D Stopped by: Birder Robson   doxycycline 100 MG tablet Commonly known as: VIBRA-TABS Stopped by: Birder Robson   magic mouthwash Soln Stopped by: Birder Robson   predniSONE 20 MG tablet Commonly known as: DELTASONE Stopped by: Birder Robson   triamcinolone 55 MCG/ACT Aero nasal inhaler Commonly known as: NASACORT Stopped by: Birder Robson       TAKE these medications    cetirizine 10 MG tablet Commonly known as: ZYRTEC Take 1 tablet (10 mg total) by mouth daily.   escitalopram 10 MG tablet Commonly known as: LEXAPRO TAKE 1 TABLET BY MOUTH EVERY DAY   fluticasone 50 MCG/ACT nasal spray Commonly known as: FLONASE Place 2 sprays into both nostrils daily.         REVIEW OF SYSTEMS: Pertinent positives and negatives discussed in HPI.   Objective:   Physical Exam: BP 126/74 (BP Location: Right Arm, Patient Position: Sitting, Cuff Size: Large)   Pulse 90   Temp 98.8 F (37.1 C) (Oral)   Ht 5\' 7"  (1.702 m)   Wt 272 lb 8 oz (123.6 kg)   SpO2 98%   BMI 42.68 kg/m  Body mass index is 42.68 kg/m. GEN: alert, well developed HEENT: clear conjunctiva,  nose with + moderate inferior turbinate hypertrophy, pink nasal mucosa, + mucoid rhinorrhea, + cobblestoning HEART: regular rate and rhythm, no murmur LUNGS: clear to auscultation bilaterally, no coughing, unlabored respiration ABDOMEN: soft, non distended  SKIN: no rashes or lesions   Assessment:   1. Other allergic rhinitis   2. Upper respiratory tract hypersensitivity reaction     Plan/Recommendations:   Other Allergic Rhinitis: - Due to turbinate hypertrophy, seasonal flares up, frequent use of antibiotics/prednisone and unresponsive to over the counter meds, will perform skin testing to identify aeroallergen triggers.   - Use  nasal saline rinses before nose sprays such as with Neilmed Sinus Rinse.  Use distilled water.   - Use Flonase 2 sprays each nostril daily. Aim upward and outward. - Use Xyzal (Levocetirizine) 5mg  daily.  Switch from Zyrtec.   Food Allergy:  - please strictly avoid strawberry - initial rxn: throat closure with ingestion. - for SKIN only reaction, okay to take Benadryl 25mg  capsules every 6 hours as needed - for SKIN + ANY additional symptoms, OR IF concern for LIFE THREATENING reaction = Epipen Autoinjector EpiPen 0.3 mg. - If using Epinephrine autoinjector, call 911  Hold all anti-histamines (Xyzal, Allegra, Zyrtec, Claritin, Benadryl, Pepcid) 3 days prior to next visit.  Follow up for skin testing 2/5 at 10 AM 1-55 + strawberry, ID okay    Alesia Morin, MD [facility]

## 2023-02-05 NOTE — Patient Instructions (Addendum)
Other Allergic Rhinitis: - Use nasal saline rinses before nose sprays such as with Neilmed Sinus Rinse.  Use distilled water.   - Use Flonase 2 sprays each nostril daily. Aim upward and outward. - Use Xyzal (Levocetirizine) 5mg  daily.  Switch from Zyrtec.   Food Allergy:  - please strictly avoid strawberry - for SKIN only reaction, okay to take Benadryl 25mg  capsules every 6 hours as needed - for SKIN + ANY additional symptoms, OR IF concern for LIFE THREATENING reaction = Epipen Autoinjector EpiPen 0.3 mg. - If using Epinephrine autoinjector, call 911  Hold all anti-histamines (Xyzal, Allegra, Zyrtec, Claritin, Benadryl, Pepcid) 3 days prior to next visit.  Follow up for skin testing 2/5 at 10 AM 1-55 + strawberry

## 2023-02-07 NOTE — Progress Notes (Deleted)
 Psychiatric Initial Adult Assessment   Patient Identification: Haley Freeman MRN:  [id number] Date of Evaluation:  02/07/2023 Referral Source: *** Chief Complaint:  No chief complaint on file.  Visit Diagnosis: No diagnosis found.  History of Present Illness:   Haley Freeman is a 48 y.o. year old female with a history of depression, cyclic vomiting syndrome, history of marijuana use, who is referred for depression.   Amitrityline 150 mg,  Low vitamin D, microcytic anemia  Lexapro 10 mg dialy       Associated Signs/Symptoms: Depression Symptoms:  {DEPRESSION SYMPTOMS:20000} (Hypo) Manic Symptoms:  {BHH MANIC SYMPTOMS:22872} Anxiety Symptoms:  {BHH ANXIETY SYMPTOMS:22873} Psychotic Symptoms:  {BHH PSYCHOTIC SYMPTOMS:22874} PTSD Symptoms: {BHH PTSD SYMPTOMS:22875}  Past Psychiatric History:  Outpatient:  Psychiatry admission:  Previous suicide attempt:  Past trials of medication:  History of violence:  History of head injury:   Previous Psychotropic Medications: {YES/NO:21197}  Substance Abuse History in the last 12 months:  {yes no:314532}  Consequences of Substance Abuse: {BHH CONSEQUENCES OF SUBSTANCE ABUSE:22880}  Past Medical History:  Past Medical History:  Diagnosis Date   Alcohol abuse    Anxiety    Back pain    Chronic anemia 05/04/2018   Constipation    CVS disease    Depression    Edema of both lower legs    Gallbladder problem    GERD (gastroesophageal reflux disease) 05/17/2019   Headache(784.0)    History of cholecystectomy 2019   Hypokalemia    Joint pain    Neuromuscular disorder (HCC)    S/P removal of right ovary 08/2017   SOB (shortness of breath)     Past Surgical History:  Procedure Laterality Date   CESAREAN SECTION  '99 and '08   x 2   CHOLECYSTECTOMY N/A 10/01/2017   Procedure: LAPAROSCOPIC CHOLECYSTECTOMY WITH POSSIBLE  INTRAOPERATIVE CHOLANGIOGRAM;  Surgeon: Almond Lint, MD;  Location: [facility];  Service: General;   Laterality: N/A;   ESOPHAGOGASTRODUODENOSCOPY N/A 09/16/2017   Procedure: ESOPHAGOGASTRODUODENOSCOPY (EGD) WITH PROPOFOL;  Surgeon: Vida Rigger, MD;  Location: [facility] END[facility];  Service: Endoscopy;  Laterality: N/A;   LAPAROSCOPY Right 08/22/2017   Procedure: LAPAROSCOPY OPERATIVE removal of right ovary and right dermoid cyst;  Surgeon: Conan Bowens, MD;  Location: [facility];  Service: Gynecology;  Laterality: Right;    Family Psychiatric History: ***  Family History:  Family History  Problem Relation Age of Onset   Hypertension Mother    High Cholesterol Mother    Heart disease Mother    Thyroid disease Mother    Depression Mother    Anxiety disorder Mother    Alcoholism Mother    Drug abuse Mother    Cancer Father    Depression Father    Alcohol abuse Father    Drug abuse Father    Asthma Maternal Aunt     Social History:   Social History   Socioeconomic History   Marital status: Divorced    Spouse name: Not on file   Number of children: Not on file   Years of education: Not on file   Highest education level: Not on file  Occupational History   Occupation: Student  Tobacco Use   Smoking status: Former    Current packs/day: 0.00    Types: Cigarettes    Quit date: 09/07/2009    Years since quitting: 13.4   Smokeless tobacco: Never  Vaping Use   Vaping status: Every Day  Substance and Sexual Activity   Alcohol use: Yes  Comment: occ   Drug use: Yes    Types: Other-see comments, Marijuana    Comment: Occasionally smokes marijuana    Sexual activity: Not Currently    Birth control/protection: None  Other Topics Concern   Not on file  Social History Narrative   Not on file   Social Drivers of Health   Financial Resource Strain: Low Risk  (01/16/2023)   Overall Financial Resource Strain (CARDIA)    Difficulty of Paying Living Expenses: Not hard at all  Food Insecurity: No Food Insecurity (01/16/2023)   Hunger Vital Sign    Worried About Running Out of Food in the  Last Year: Never true    Ran Out of Food in the Last Year: Never true  Transportation Needs: No Transportation Needs (01/16/2023)   PRAPARE - Administrator, Civil Service (Medical): No    Lack of Transportation (Non-Medical): No  Physical Activity: Sufficiently Active (01/16/2023)   Exercise Vital Sign    Days of Exercise per Week: 5 days    Minutes of Exercise per Session: 30 min  Stress: No Stress Concern Present (01/16/2023)   [employer] of Occupational Health - Occupational Stress Questionnaire    Feeling of Stress : Only a little  Social Connections: Moderately Integrated (01/16/2023)   Social Connection and Isolation Panel [NHANES]    Frequency of Communication with Friends and Family: More than three times a week    Frequency of Social Gatherings with Friends and Family: More than three times a week    Attends Religious Services: More than 4 times per year    Active Member of [employer] or Organizations: No    Attends Banker Meetings: Never    Marital Status: Married    Additional Social History: ***  Allergies:   Allergies  Allergen Reactions   Penicillins Anaphylaxis    Swells throat. Has patient had a PCN reaction causing immediate rash, facial/tongue/throat swelling, SOB or lightheadedness with hypotension: Yes Has patient had a PCN reaction causing severe rash involving mucus membranes or skin necrosis: No Has patient had a PCN reaction that required hospitalization: Yes Has patient had a PCN reaction occurring within the last 10 years: No If all of the above answers are "NO", then may proceed with Cephalosporin use.    Strawberry (Diagnostic) Shortness Of Breath    Metabolic Disorder Labs: Lab Results  Component Value Date   HGBA1C 5.5 03/16/2019   MPG 134.11 10/29/2017   No results found for: "PROLACTIN" Lab Results  Component Value Date   CHOL 221 (H) 01/03/2023   TRIG 97 01/03/2023   HDL 60 01/03/2023   CHOLHDL 3.7 01/03/2023    LDLCALC 144 (H) 01/03/2023   LDLCALC 129 (H) 09/24/2021   Lab Results  Component Value Date   TSH 1.740 01/03/2023    Therapeutic Level Labs: No results found for: "LITHIUM" No results found for: "CBMZ" No results found for: "VALPROATE"  Current Medications: Current Outpatient Medications  Medication Sig Dispense Refill   cetirizine (ZYRTEC) 10 MG tablet Take 1 tablet (10 mg total) by mouth daily. 30 tablet 11   escitalopram (LEXAPRO) 10 MG tablet TAKE 1 TABLET BY MOUTH EVERY DAY (Patient not taking: Reported on 02/05/2023) 90 tablet 1   fluticasone (FLONASE) 50 MCG/ACT nasal spray Place 2 sprays into both nostrils daily. 16 g 6   levocetirizine (XYZAL) 5 MG tablet Take 1 tablet (5 mg total) by mouth every evening. 30 tablet 5   No current facility-administered medications  for this visit.    Musculoskeletal: Strength & Muscle Tone: within normal limits Gait & Station: normal Patient leans: N/A  Psychiatric Specialty Exam: Review of Systems  There were no vitals taken for this visit.There is no height or weight on file to calculate BMI.  General Appearance: {Appearance:22683}  Eye Contact:  {BHH EYE CONTACT:22684}  Speech:  Clear and Coherent  Volume:  Normal  Mood:  {BHH MOOD:22306}  Affect:  {Affect (PAA):22687}  Thought Process:  Coherent  Orientation:  Full (Time, Place, and Person)  Thought Content:  Logical  Suicidal Thoughts:  {ST/HT (PAA):22692}  Homicidal Thoughts:  {ST/HT (PAA):22692}  Memory:  Immediate;   Good  Judgement:  {Judgement (PAA):22694}  Insight:  {Insight (PAA):22695}  Psychomotor Activity:  Normal  Concentration:  Concentration: Good and Attention Span: Good  Recall:  Good  Fund of Knowledge:Good  Language: Good  Akathisia:  No  Handed:  Right  AIMS (if indicated):  not done  Assets:  Communication Skills Desire for Improvement  ADL's:  Intact  Cognition: WNL  Sleep:  {BHH GOOD/FAIR/POOR:22877}   Screenings: GAD-7    Flowsheet Row  Office Visit from 12/25/2017 in [facility] Office Visit from 12/02/2017 in [facility] Office Visit from 11/04/2017 in [facility]  Total GAD-7 Score 0 2 4      PHQ2-9    Flowsheet Row Clinical Support from 01/16/2023 in [facility] Of [facility] Clinical Support from 01/11/2022 in [facility] - A Dept Of Eligha [facility]idegroom [facility] Office Visit from 09/24/2021 in [facility] - A Dept Of  [facility] Clinical Support from 09/30/2020 in [facility] - [facility] Dept Of Eligha [facility]groom [facility] Office Visit from 11/10/2019 in [facility] Total Score 0 0 0 2 4  PHQ-9 Total Score -- -- 0 9 16      Flowsheet Row ED from 12/05/2022 in [facility] Emergency Department at [facility] ED from 06/24/2021 in [facility] Emergency Department at [facility] ED from 05/03/2020 in [facility] Emergency Department at [facility]  C-SSRS RISK CATEGORY No Risk No Risk No Risk       Assessment and Plan:    Plan   The patient demonstrates the following risk factors for suicide: Chronic risk factors for suicide include: {Chronic Risk Factors for ZOXWRUE:45409811}. Acute risk factors for suicide include: {Acute Risk Factors for BJYNWGN:56213086}. Protective factors for this patient include: {Protective Factors for Suicide VHQI:69629528}. Considering these factors, the overall suicide risk at this point appears to be {Desc; low/moderate/high:110033}. Patient {ACTION; IS/IS UXL:24401027} appropriate for outpatient follow up.   Collaboration of Care: {BH OP Collaboration of Care:21014065}  Patient/Guardian was advised Release of Information must be obtained prior to any record release in order to collaborate their care with an outside provider.  Patient/Guardian was advised if they have not already done so to contact the registration department to sign all necessary forms in order for [name] to release information regarding their care.   Consent: Patient/Guardian gives verbal consent for treatment and assignment of benefits for services provided during this visit. Patient/Guardian expressed understanding and agreed to proceed.   Neysa Hotter, MD 1/31/20253:01 PM

## 2023-02-08 ENCOUNTER — Encounter (HOSPITAL_COMMUNITY): Payer: Self-pay

## 2023-02-08 ENCOUNTER — Ambulatory Visit (HOSPITAL_COMMUNITY): Payer: 59 | Admitting: Psychiatry

## 2023-02-10 ENCOUNTER — Telehealth (HOSPITAL_COMMUNITY): Payer: Self-pay | Admitting: Nurse Practitioner

## 2023-02-11 ENCOUNTER — Telehealth: Payer: 59 | Admitting: Nurse Practitioner

## 2023-02-11 DIAGNOSIS — M545 Low back pain, unspecified: Secondary | ICD-10-CM | POA: Diagnosis not present

## 2023-02-11 MED ORDER — CYCLOBENZAPRINE HCL 10 MG PO TABS: 10.0000 mg | ORAL_TABLET | Freq: Three times a day (TID) | ORAL | 0 refills | Status: AC | PRN

## 2023-02-11 NOTE — Addendum Note (Signed)
Addended by: Viviano Simas E on: 02/11/2023 04:40 PM   Modules accepted: Orders, Level of Service

## 2023-02-11 NOTE — Progress Notes (Signed)
 [name],  Because this pain is recurrent, causing weakness/numbness, I feel your condition warrants further evaluation and I recommend that you be seen in a face-to-face visit.   NOTE: There will be NO CHARGE for this E-Visit   If you are having a true medical emergency, please call 911.     For an urgent face to face visit, [facility] has multiple urgent care centers for your convenience.  Click the link below for the full list of locations and hours, walk-in wait times, appointment scheduling options and driving directions:  Urgent Care - [place], [place], [place], [place], [place], [place]  [place]     Your MyChart E-visit questionnaire answers were reviewed by a board certified advanced clinical practitioner to complete your personal care plan based on your specific symptoms.    Thank you for using e-Visits.

## 2023-02-11 NOTE — Progress Notes (Signed)

## 2023-02-12 ENCOUNTER — Ambulatory Visit: Payer: 59 | Admitting: Internal Medicine

## 2023-02-21 ENCOUNTER — Ambulatory Visit: Payer: 59 | Admitting: Internal Medicine

## 2023-02-25 ENCOUNTER — Ambulatory Visit: Payer: 59 | Admitting: Internal Medicine

## 2023-03-23 ENCOUNTER — Telehealth: Admitting: Nurse Practitioner

## 2023-03-23 DIAGNOSIS — J00 Acute nasopharyngitis [common cold]: Secondary | ICD-10-CM

## 2023-03-23 MED ORDER — AZELASTINE HCL 0.1 % NA SOLN: 2.0000 | Freq: Two times a day (BID) | NASAL | 12 refills | Status: AC

## 2023-03-23 MED ORDER — CETIRIZINE-PSEUDOEPHEDRINE ER 5-120 MG PO TB12: 1.0000 | ORAL_TABLET | Freq: Two times a day (BID) | ORAL | 0 refills | Status: AC

## 2023-03-23 NOTE — Progress Notes (Signed)
 E visit for Allergic Rhinitis We are sorry that you are not feeling well.  Here is how we plan to help!  Based on what you have shared with me it looks like you have Allergic Rhinitis.  Rhinitis is when a reaction occurs that causes nasal congestion, runny nose, sneezing, and itching.  Most types of rhinitis are caused by an inflammation and are associated with symptoms in the eyes ears or throat. There are several types of rhinitis.  The most common are acute rhinitis, which is usually caused by a viral illness, allergic or seasonal rhinitis, and nonallergic or year-round rhinitis.  Nasal allergies occur certain times of the year.  Allergic rhinitis is caused when allergens in the air trigger the release of histamine in the body.  Histamine causes itching, swelling, and fluid to build up in the fragile linings of the nasal passages, sinuses and eyelids.  An itchy nose and clear discharge are common.  I recommend the following over the counter treatments: Zyrtec D - speak with the pharmacist, a prescription has been sent.  I also would recommend a nasal spray: Astelin nasal spray. Use as directed.   HOME CARE:  You can use an over-the-counter saline nasal spray as needed Avoid areas where there is heavy dust, mites, or molds Stay indoors on windy days during the pollen season Keep windows closed in home, at least in bedroom; use air conditioner. Use high-efficiency house air filter Keep windows closed in car, turn AC on re-circulate Avoid playing out with dog during pollen season  GET HELP RIGHT AWAY IF:  If your symptoms do not improve within 10 days You become short of breath You develop yellow or green discharge from your nose for over 3 days You have coughing fits  MAKE SURE YOU:  Understand these instructions Will watch your condition Will get help right away if you are not doing well or get worse  Thank you for choosing an e-visit. Your e-visit answers were reviewed by a  board certified advanced clinical practitioner to complete your personal care plan. Depending upon the condition, your plan could have included both over the counter or prescription medications. Please review your pharmacy choice. Be sure that the pharmacy you have chosen is open so that you can pick up your prescription now.  If there is a problem you may message your provider in MyChart to have the prescription routed to another pharmacy. Your safety is important to [name]. If you have drug allergies check your prescription carefully.  For the next 24 hours, you can use MyChart to ask questions about today's visit, request a non-urgent call back, or ask for a work or school excuse from your e-visit provider. You will get an email in the next two days asking about your experience. I hope that your e-visit has been valuable and will speed your recovery.

## 2023-03-23 NOTE — Progress Notes (Signed)
 I have spent 5 minutes in review of e-visit questionnaire, review and updating patient chart, medical decision making and response to patient.   Laure Kidney, PA-C

## 2023-03-24 ENCOUNTER — Telehealth: Admitting: Family Medicine

## 2023-03-24 ENCOUNTER — Telehealth

## 2023-03-24 DIAGNOSIS — B9689 Other specified bacterial agents as the cause of diseases classified elsewhere: Secondary | ICD-10-CM | POA: Diagnosis not present

## 2023-03-24 DIAGNOSIS — J019 Acute sinusitis, unspecified: Secondary | ICD-10-CM | POA: Diagnosis not present

## 2023-03-24 MED ORDER — AZITHROMYCIN 250 MG PO TABS: ORAL_TABLET | ORAL | 0 refills | Status: AC

## 2023-03-24 NOTE — Patient Instructions (Signed)
 Haley Freeman, thank you for joining [name], NP for today's virtual visit.  While this provider is not your primary care provider (PCP), if your PCP is located in our provider database this encounter information will be shared with them immediately following your visit.   A [facility] MyChart account gives you access to today's visit and all your visits, tests, and labs performed at [facility] " click here if you don't have a [facility] MyChart account or go to [email or web address]  Consent: (Patient) Haley Freeman provided verbal consent for this virtual visit at the beginning of the encounter.  Current Medications:  Current Outpatient Medications:    azithromycin (ZITHROMAX) 250 MG tablet, Take 2 tablets on day 1, then 1 tablet daily on days 2 through 5, Disp: 6 tablet, Rfl: 0   azelastine (ASTELIN) 0.1 % nasal spray, Place 2 sprays into both nostrils 2 (two) times daily. Use in each nostril as directed, Disp: 30 mL, Rfl: 12   cetirizine (ZYRTEC) 10 MG tablet, Take 1 tablet (10 mg total) by mouth daily., Disp: 30 tablet, Rfl: 11   cetirizine-pseudoephedrine (ZYRTEC-D) 5-120 MG tablet, Take 1 tablet by mouth 2 (two) times daily for 5 days., Disp: 10 tablet, Rfl: 0   cyclobenzaprine (FLEXERIL) 10 MG tablet, Take 1 tablet (10 mg total) by mouth 3 (three) times daily as needed for muscle spasms., Disp: 30 tablet, Rfl: 0   escitalopram (LEXAPRO) 10 MG tablet, TAKE 1 TABLET BY MOUTH EVERY DAY (Patient not taking: Reported on 02/05/2023), Disp: 90 tablet, Rfl: 1   fluticasone (FLONASE) 50 MCG/ACT nasal spray, Place 2 sprays into both nostrils daily., Disp: 16 g, Rfl: 6   levocetirizine (XYZAL) 5 MG tablet, Take 1 tablet (5 mg total) by mouth every evening., Disp: 30 tablet, Rfl: 5   Medications ordered in this encounter:  Meds ordered this encounter  Medications   azithromycin (ZITHROMAX) 250 MG tablet    Sig: Take 2 tablets on day 1, then 1 tablet daily on days 2  through 5    Dispense:  6 tablet    Refill:  0    Supervising Provider:   Merrilee Jansky [phone]     *If you need refills on other medications prior to your next appointment, please contact your pharmacy*  Follow-Up: Call back or seek an in-person evaluation if the symptoms worsen or if the condition fails to improve as anticipated.  [facility] [phone]  Other Instructions Allergies can cause a lot of symptoms: watery, itching eyes, runny nose (clear), sneezing, sinus pressure, and headaches. This is not making you contagious to others. You can not spread to others or catch this from others. These symptoms happen after you have been exposed to something that you are allergic to an allergen. Prevention: The best prevention is to avoid the things that you know you are allergic to, for example smoke (cigarette, cigar, wood); pollens and molds; animal dander; dust mites. And indoor inhalants such as cleaning products or aerosol sprays.  Target your bedroom as allergy free by removing carpets, damp mopping floors weekly, hanging washable curtains instead of blinds, removing books and stuffed animals, using foam pillows, and encasing pillows and mattress in plastic. Do not blow your nose too frequently or too hard. It may cause your eardrum to perforate (tear). Blow through both nostrils at the same time to equalize pressure.  Use tissue when you blow your nose. Dispose of them and then wash your  hands. If no tissue is available, do the "elbow sneeze" into the bend of your arm (away from your open hands). Always wash your hands.  If able use the [facility] in the house and car to reduce exposure to pollens. Use an air filtration system in your house or buy a small one for your bedroom. Dust your house often, using a cloth and cleaner or polish that keeps the dust from flying into the air. Allergy testing can be done if you have had allergies for a long time and are not doing well on  current treatments. Take your medications as directed. If you find the medications are not working let your healthcare provider know. It might take more than one medication to control allergies, especially, seasonal ones.      If you have been instructed to have an in-person evaluation today at a local Urgent Care facility, please use the link below. It will take you to a list of all of our available [facility], including address, phone number and hours of operation. Please do not delay care.  [facility]  If you or a family member do not have a primary care provider, use the link below to schedule a visit and establish care. When you choose a [facility] primary care physician or advanced practice provider, you gain a long-term partner in health. Find a Primary Care Provider  Learn more about [facility]'s in-office and virtual care options: [facility] - Get Care Now

## 2023-03-24 NOTE — Progress Notes (Signed)
 Virtual Visit Consent   [name], you are scheduled for a virtual visit with a [facility] provider today. Just as with appointments in the office, your consent must be obtained to participate. Your consent will be active for this visit and any virtual visit you may have with one of our providers in the next 365 days. If you have a MyChart account, a copy of this consent can be sent to you electronically.  As this is a virtual visit, video technology does not allow for your provider to perform a traditional examination. This may limit your provider's ability to fully assess your condition. If your provider identifies any concerns that need to be evaluated in person or the need to arrange testing (such as labs, EKG, etc.), we will make arrangements to do so. Although advances in technology are sophisticated, we cannot ensure that it will always work on either your end or our end. If the connection with a video visit is poor, the visit may have to be switched to a telephone visit. With either a video or telephone visit, we are not always able to ensure that we have a secure connection.  By engaging in this virtual visit, you consent to the provision of healthcare and authorize for your insurance to be billed (if applicable) for the services provided during this visit. Depending on your insurance coverage, you may receive a charge related to this service.  I need to obtain your verbal consent now. Are you willing to proceed with your visit today? [name] has provided verbal consent on 03/24/2023 for a virtual visit (video or telephone). [name], NP  Date: 03/24/2023 11:30 AM   Virtual Visit via Video Note   I, [name], connected with  Haley Freeman  ([id number], Jan 27, 1975) on 03/24/23 at 11:30 AM EDT by a video-enabled telemedicine application and verified that I am speaking with the correct person using two identifiers.  Location: Patient: Virtual Visit Location  Patient: Home Provider: Virtual Visit Location Provider: Home Office   I discussed the limitations of evaluation and management by telemedicine and the availability of in person appointments. The patient expressed understanding and agreed to proceed.    History of Present Illness: Haley Freeman is a 48 y.o. who identifies as a female who was assigned female at birth, and is being seen today for sinus congestion  Onset was a few weeks ago with season changes. Now has a fever of 100.2, neck and gland pain, sore throat, congestion- thick and yellow, cough. Modifying factors are flonase, and xyzal  History of a lot of allergies and is seeing someone for this now for testing  Denies chest pain, shortness of breath  Problems:  Patient Active Problem List   Diagnosis Date Noted   Healthcare maintenance 09/24/2021   Morbid obesity (HCC) 11/10/2019   GERD (gastroesophageal reflux disease) 05/17/2019   History of ETOH abuse 05/17/2019   Mixed hyperlipidemia 05/17/2019   Bipolar disorder with depression (HCC) 03/18/2019   Cyst of left ovary 09/07/2018   Gastroesophageal reflux disease with esophagitis 06/04/2018   Chronic anemia 05/04/2018   Hypokalemia 09/27/2017   Elevated LFTs 09/27/2017   Abnormal biliary HIDA scan 09/27/2017   Marijuana abuse 09/27/2017   Intractable nausea and vomiting 09/26/2017   Cannabis hyperemesis syndrome concurrent with and due to cannabis abuse (HCC) 09/16/2017   Dermoid 08/22/2017    Allergies:  Allergies  Allergen Reactions   Penicillins Anaphylaxis    Swells throat.  Has patient had a PCN reaction causing immediate rash, facial/tongue/throat swelling, SOB or lightheadedness with hypotension: Yes Has patient had a PCN reaction causing severe rash involving mucus membranes or skin necrosis: No Has patient had a PCN reaction that required hospitalization: Yes Has patient had a PCN reaction occurring within the last 10 years: No If all of the above  answers are "NO", then may proceed with Cephalosporin use.    Strawberry (Diagnostic) Shortness Of Breath   Medications:  Current Outpatient Medications:    azelastine (ASTELIN) 0.1 % nasal spray, Place 2 sprays into both nostrils 2 (two) times daily. Use in each nostril as directed, Disp: 30 mL, Rfl: 12   cetirizine (ZYRTEC) 10 MG tablet, Take 1 tablet (10 mg total) by mouth daily., Disp: 30 tablet, Rfl: 11   cetirizine-pseudoephedrine (ZYRTEC-D) 5-120 MG tablet, Take 1 tablet by mouth 2 (two) times daily for 5 days., Disp: 10 tablet, Rfl: 0   cyclobenzaprine (FLEXERIL) 10 MG tablet, Take 1 tablet (10 mg total) by mouth 3 (three) times daily as needed for muscle spasms., Disp: 30 tablet, Rfl: 0   escitalopram (LEXAPRO) 10 MG tablet, TAKE 1 TABLET BY MOUTH EVERY DAY (Patient not taking: Reported on 02/05/2023), Disp: 90 tablet, Rfl: 1   fluticasone (FLONASE) 50 MCG/ACT nasal spray, Place 2 sprays into both nostrils daily., Disp: 16 g, Rfl: 6   levocetirizine (XYZAL) 5 MG tablet, Take 1 tablet (5 mg total) by mouth every evening., Disp: 30 tablet, Rfl: 5  Observations/Objective: Patient is well-developed, well-nourished in no acute distress.  Resting comfortably  at home.  Head is normocephalic, atraumatic.  No labored breathing.  Speech is clear and coherent with logical content.  Patient is alert and oriented at baseline.    Assessment and Plan:   Follow Up Instructions: I discussed the assessment and treatment plan with the patient. The patient was provided an opportunity to ask questions and all were answered. The patient agreed with the plan and demonstrated an understanding of the instructions.  A copy of instructions were sent to the patient via MyChart unless otherwise noted below.   The patient was advised to call back or seek an in-person evaluation if the symptoms worsen or if the condition fails to improve as anticipated.    [name], NP

## 2023-04-11 ENCOUNTER — Ambulatory Visit: Admitting: Internal Medicine

## 2023-04-18 ENCOUNTER — Ambulatory Visit: Admitting: Internal Medicine

## 2023-07-04 ENCOUNTER — Ambulatory Visit: Payer: Self-pay | Admitting: Nurse Practitioner

## 2023-08-07 ENCOUNTER — Other Ambulatory Visit: Payer: Self-pay | Admitting: Internal Medicine

## 2023-09-11 ENCOUNTER — Telehealth: Admitting: Physician Assistant

## 2023-09-11 DIAGNOSIS — J069 Acute upper respiratory infection, unspecified: Secondary | ICD-10-CM | POA: Diagnosis not present

## 2023-09-11 MED ORDER — LIDOCAINE VISCOUS HCL 2 % MT SOLN
5.0000 mL | OROMUCOSAL | 0 refills | Status: AC | PRN
Start: 2023-09-11 — End: ?

## 2023-09-11 MED ORDER — PROMETHAZINE-DM 6.25-15 MG/5ML PO SYRP: 5.0000 mL | ORAL_SOLUTION | Freq: Four times a day (QID) | ORAL | 0 refills | Status: DC | PRN

## 2023-09-11 NOTE — Patient Instructions (Signed)
 Haley Freeman, thank you for joining Delon CHRISTELLA Dickinson, PA-C for today's virtual visit.  While this provider is not your primary care provider (PCP), if your PCP is located in our provider database this encounter information will be shared with them immediately following your visit.   A [facility] MyChart account gives you access to today's visit and all your visits, tests, and labs performed at [facility]  click here if you don't have a [facility] MyChart account or go to [email or web address]  Consent: (Patient) Haley Freeman provided verbal consent for this virtual visit at the beginning of the encounter.  Current Medications:  Current Outpatient Medications:    lidocaine  (XYLOCAINE ) 2 % solution, Use as directed 5 mLs in the mouth or throat every 4 (four) hours as needed (sore throat)., Disp: 100 mL, Rfl: 0   promethazine -dextromethorphan (PROMETHAZINE -DM) 6.25-15 MG/5ML syrup, Take 5 mLs by mouth 4 (four) times daily as needed., Disp: 118 mL, Rfl: 0   azelastine  (ASTELIN ) 0.1 % nasal spray, Place 2 sprays into both nostrils 2 (two) times daily. Use in each nostril as directed, Disp: 30 mL, Rfl: 12   cetirizine  (ZYRTEC ) 10 MG tablet, Take 1 tablet (10 mg total) by mouth daily., Disp: 30 tablet, Rfl: 11   cyclobenzaprine  (FLEXERIL ) 10 MG tablet, Take 1 tablet (10 mg total) by mouth 3 (three) times daily as needed for muscle spasms., Disp: 30 tablet, Rfl: 0   escitalopram  (LEXAPRO ) 10 MG tablet, TAKE 1 TABLET BY MOUTH EVERY DAY (Patient not taking: Reported on 02/05/2023), Disp: 90 tablet, Rfl: 1   fluticasone  (FLONASE ) 50 MCG/ACT nasal spray, Place 2 sprays into both nostrils daily., Disp: 16 g, Rfl: 6   levocetirizine (XYZAL ) 5 MG tablet, TAKE 1 TABLET BY MOUTH EVERY DAY IN THE EVENING, Disp: 90 tablet, Rfl: 1   Medications ordered in this encounter:  Meds ordered this encounter  Medications   lidocaine  (XYLOCAINE ) 2 % solution    Sig: Use as directed 5 mLs in  the mouth or throat every 4 (four) hours as needed (sore throat).    Dispense:  100 mL    Refill:  0    Supervising Provider:   LAMPTEY, PHILIP O [[id number]]   promethazine -dextromethorphan (PROMETHAZINE -DM) 6.25-15 MG/5ML syrup    Sig: Take 5 mLs by mouth 4 (four) times daily as needed.    Dispense:  118 mL    Refill:  0    Supervising Provider:   BLAISE ALEENE KIDD [[id number]]     *If you need refills on other medications prior to your next appointment, please contact your pharmacy*  Follow-Up: Call back or seek an in-person evaluation if the symptoms worsen or if the condition fails to improve as anticipated.  [facility] [phone]  Other Instructions Viral Respiratory Infection A respiratory infection is an illness that affects part of the respiratory system, such as the lungs, nose, or throat. A respiratory infection that is caused by a virus is called a viral respiratory infection. Common types of viral respiratory infections include: A cold. The flu (influenza). A respiratory syncytial virus (RSV) infection. What are the causes? This condition is caused by a virus. The virus may spread through contact with droplets or direct contact with infected people or their mucus or secretions. The virus may spread from person to person (is contagious). What are the signs or symptoms? Symptoms of this condition include: A stuffy or runny nose. A sore throat or cough. Shortness of breath or  difficulty breathing. Yellow or green mucus (sputum). Other symptoms may include: A fever. Sweating or chills. Fatigue. Achy muscles. A headache. How is this diagnosed? This condition may be diagnosed based on: Your symptoms. A physical exam. Testing of secretions from the nose or throat. Chest X-ray. How is this treated? This condition may be treated with medicines, such as: Antiviral medicine. This may shorten the length of time a person has symptoms. Expectorants. These  make it easier to cough up mucus. Decongestant nasal sprays. Acetaminophen  or NSAIDs, such as ibuprofen , to relieve fever and pain. Antibiotic medicines are not prescribed for viral infections.This is because antibiotics are designed to kill bacteria. They do not kill viruses. Follow these instructions at home: Managing pain and congestion Take over-the-counter and prescription medicines only as told by your health care provider. If you have a sore throat, gargle with a mixture of salt and water 3-4 times a day or as needed. To make salt water, completely dissolve -1 tsp (3-6 g) of salt in 1 cup (237 mL) of warm water. Use nose drops made from salt water to ease congestion and soften raw skin around your nose. Take 2 tsp (10 mL) of honey at bedtime to lessen coughing at night. Do not give honey to children who are younger than 1 year. Drink enough fluid to keep your urine pale yellow. This helps prevent dehydration and helps loosen up mucus. General instructions  Rest as much as possible. Do not drink alcohol . Do not use any products that contain nicotine  or tobacco. These products include cigarettes, chewing tobacco, and vaping devices, such as e-cigarettes. If you need help quitting, ask your health care provider. Keep all follow-up visits. This is important. How is this prevented?     Get an annual flu shot. You may get the flu shot in late summer, fall, or winter. Ask your health care provider when you should get your flu shot. Avoid spreading your infection to other people. If you are sick: Wash your hands with soap and water often, especially after you cough or sneeze. Wash for at least 20 seconds. If soap and water are not available, use alcohol -based hand sanitizer. Cover your mouth when you cough. Cover your nose and mouth when you sneeze. Do not share cups or eating utensils. Clean commonly used objects often. Clean commonly touched surfaces. Stay home from work or school as  told by your health care provider. Avoid contact with people who are sick during cold and flu season. This is generally fall and winter. Contact a health care provider if: Your symptoms last for 10 days or longer. Your symptoms get worse over time. You have severe sinus pain in your face or forehead. The glands in your jaw or neck become very swollen. You have shortness of breath. Get help right away if you: Feel pain or pressure in your chest. Have trouble breathing. Faint or feel like you will faint. Have severe and persistent vomiting. Feel confused or disoriented. These symptoms may represent a serious problem that is an emergency. Do not wait to see if the symptoms will go away. Get medical help right away. Call your local emergency services (911 in the [place] Do not drive yourself to the hospital. Summary A respiratory infection is an illness that affects part of the respiratory system, such as the lungs, nose, or throat. A respiratory infection that is caused by a virus is called a viral respiratory infection. Common types of viral respiratory infections include a cold, influenza,  and respiratory syncytial virus (RSV) infection. Symptoms of this condition include a stuffy or runny nose, cough, fatigue, achy muscles, sore throat, and fevers or chills. Antibiotic medicines are not prescribed for viral infections. This is because antibiotics are designed to kill bacteria. They are not effective against viruses. This information is not intended to replace advice given to you by your health care provider. Make sure you discuss any questions you have with your health care provider. Document Revised: 03/30/2020 Document Reviewed: 03/30/2020 Elsevier Patient Education  2024 Elsevier Inc.   If you have been instructed to have an in-person evaluation today at a local Urgent Care facility, please use the link below. It will take you to a list of all of our available [facility],  including address, phone number and hours of operation. Please do not delay care.  [facility]  If you or a family member do not have a primary care provider, use the link below to schedule a visit and establish care. When you choose a [facility] primary care physician or advanced practice provider, you gain a long-term partner in health. Find a Primary Care Provider  Learn more about [facility]'s in-office and virtual care options: [facility] - Get Care Now

## 2023-09-11 NOTE — Progress Notes (Signed)
 Virtual Visit Consent   Haley Freeman, you are scheduled for a virtual visit with a [facility] provider today. Just as with appointments in the office, your consent must be obtained to participate. Your consent will be active for this visit and any virtual visit you may have with one of our providers in the next 365 days. If you have a MyChart account, a copy of this consent can be sent to you electronically.  As this is a virtual visit, video technology does not allow for your provider to perform a traditional examination. This may limit your provider's ability to fully assess your condition. If your provider identifies any concerns that need to be evaluated in person or the need to arrange testing (such as labs, EKG, etc.), we will make arrangements to do so. Although advances in technology are sophisticated, we cannot ensure that it will always work on either your end or our end. If the connection with a video visit is poor, the visit may have to be switched to a telephone visit. With either a video or telephone visit, we are not always able to ensure that we have a secure connection.  By engaging in this virtual visit, you consent to the provision of healthcare and authorize for your insurance to be billed (if applicable) for the services provided during this visit. Depending on your insurance coverage, you may receive a charge related to this service.  I need to obtain your verbal consent now. Are you willing to proceed with your visit today? Haley Freeman has provided verbal consent on 09/11/2023 for a virtual visit (video or telephone). Delon CHRISTELLA Dickinson, PA-C  Date: 09/11/2023 3:44 PM   Virtual Visit via Video Note   I, Delon CHRISTELLA Dickinson, connected with  Haley Freeman  ([id number], [birth year]) on 09/11/23 at  3:30 PM EDT by a video-enabled telemedicine application and verified that I am speaking with the correct person using two identifiers.  Location: Patient: Virtual Visit  Location Patient: Home Provider: Virtual Visit Location Provider: Home Office   I discussed the limitations of evaluation and management by telemedicine and the availability of in person appointments. The patient expressed understanding and agreed to proceed.    History of Present Illness: Haley Freeman is a 48 y.o. who identifies as a female who was assigned female at birth, and is being seen today for URI symptoms.  HPI: URI  This is a new problem. The current episode started in the past 7 days (3 days ago). The problem has been gradually worsening. There has been no fever. Associated symptoms include congestion, coughing, headaches, rhinorrhea (and post nasal drainage), sinus pain and a sore throat. Pertinent negatives include no diarrhea, ear pain, nausea, plugged ear sensation, swollen glands, vomiting or wheezing. Associated symptoms comments: Weak, dizzy, chills. Treatments tried: allergy medication, benadryl , flonase . The treatment provided no relief.     Problems:  Patient Active Problem List   Diagnosis Date Noted   Healthcare maintenance 09/24/2021   Morbid obesity (HCC) 11/10/2019   GERD (gastroesophageal reflux disease) 05/17/2019   History of ETOH abuse 05/17/2019   Mixed hyperlipidemia 05/17/2019   Bipolar disorder with depression (HCC) 03/18/2019   Cyst of left ovary 09/07/2018   Gastroesophageal reflux disease with esophagitis 06/04/2018   Chronic anemia 05/04/2018   Hypokalemia 09/27/2017   Elevated LFTs 09/27/2017   Abnormal biliary HIDA scan 09/27/2017   Marijuana abuse 09/27/2017   Intractable nausea and vomiting 09/26/2017   Cannabis hyperemesis syndrome concurrent  with and due to cannabis abuse (HCC) 09/16/2017   Dermoid 08/22/2017    Allergies:  Allergies  Allergen Reactions   Penicillins Anaphylaxis    Swells throat. Has patient had a PCN reaction causing immediate rash, facial/tongue/throat swelling, SOB or lightheadedness with hypotension: Yes Has  patient had a PCN reaction causing severe rash involving mucus membranes or skin necrosis: No Has patient had a PCN reaction that required hospitalization: Yes Has patient had a PCN reaction occurring within the last 10 years: No If all of the above answers are NO, then may proceed with Cephalosporin use.    Strawberry (Diagnostic) Shortness Of Breath   Medications:  Current Outpatient Medications:    lidocaine  (XYLOCAINE ) 2 % solution, Use as directed 5 mLs in the mouth or throat every 4 (four) hours as needed (sore throat)., Disp: 100 mL, Rfl: 0   promethazine -dextromethorphan (PROMETHAZINE -DM) 6.25-15 MG/5ML syrup, Take 5 mLs by mouth 4 (four) times daily as needed., Disp: 118 mL, Rfl: 0   azelastine  (ASTELIN ) 0.1 % nasal spray, Place 2 sprays into both nostrils 2 (two) times daily. Use in each nostril as directed, Disp: 30 mL, Rfl: 12   cetirizine  (ZYRTEC ) 10 MG tablet, Take 1 tablet (10 mg total) by mouth daily., Disp: 30 tablet, Rfl: 11   cyclobenzaprine  (FLEXERIL ) 10 MG tablet, Take 1 tablet (10 mg total) by mouth 3 (three) times daily as needed for muscle spasms., Disp: 30 tablet, Rfl: 0   escitalopram  (LEXAPRO ) 10 MG tablet, TAKE 1 TABLET BY MOUTH EVERY DAY (Patient not taking: Reported on 02/05/2023), Disp: 90 tablet, Rfl: 1   fluticasone  (FLONASE ) 50 MCG/ACT nasal spray, Place 2 sprays into both nostrils daily., Disp: 16 g, Rfl: 6   levocetirizine (XYZAL ) 5 MG tablet, TAKE 1 TABLET BY MOUTH EVERY DAY IN THE EVENING, Disp: 90 tablet, Rfl: 1  Observations/Objective: Patient is well-developed, well-nourished in no acute distress.  Resting comfortably at home.  Head is normocephalic, atraumatic.  No labored breathing.  Speech is clear and coherent with logical content.  Patient is alert and oriented at baseline.    Assessment and Plan: 1. Viral URI with cough (Primary) - lidocaine  (XYLOCAINE ) 2 % solution; Use as directed 5 mLs in the mouth or throat every 4 (four) hours as  needed (sore throat).  Dispense: 100 mL; Refill: 0 - promethazine -dextromethorphan (PROMETHAZINE -DM) 6.25-15 MG/5ML syrup; Take 5 mLs by mouth 4 (four) times daily as needed.  Dispense: 118 mL; Refill: 0  - Suspect viral URI, advised to take at home Covid test - Symptomatic medications of choice over the counter as needed - Added viscous lidocaine  for throat symptoms and Promethazine  DM cough syrup for cough and congestion - Push fluids - Rest - Seek further evaluation if symptoms change or worsen   Follow Up Instructions: I discussed the assessment and treatment plan with the patient. The patient was provided an opportunity to ask questions and all were answered. The patient agreed with the plan and demonstrated an understanding of the instructions.  A copy of instructions were sent to the patient via MyChart unless otherwise noted below.    The patient was advised to call back or seek an in-person evaluation if the symptoms worsen or if the condition fails to improve as anticipated.    Delon CHRISTELLA Dickinson, PA-C

## 2024-01-19 ENCOUNTER — Telehealth

## 2024-01-19 DIAGNOSIS — M79601 Pain in right arm: Secondary | ICD-10-CM | POA: Diagnosis not present

## 2024-01-19 DIAGNOSIS — J069 Acute upper respiratory infection, unspecified: Secondary | ICD-10-CM

## 2024-01-19 MED ORDER — ALBUTEROL SULFATE HFA 108 (90 BASE) MCG/ACT IN AERS: 1.0000 | INHALATION_SPRAY | Freq: Four times a day (QID) | RESPIRATORY_TRACT | 0 refills | Status: AC | PRN

## 2024-01-19 MED ORDER — PREDNISONE 10 MG (21) PO TBPK: ORAL_TABLET | ORAL | 0 refills | Status: AC

## 2024-01-19 MED ORDER — PROMETHAZINE-DM 6.25-15 MG/5ML PO SYRP: 5.0000 mL | ORAL_SOLUTION | Freq: Four times a day (QID) | ORAL | 0 refills | Status: AC | PRN

## 2024-01-19 NOTE — Progress Notes (Signed)
 " Virtual Visit Consent   Haley Freeman, you are scheduled for a virtual visit with a [facility] provider today. Just as with appointments in the office, your consent must be obtained to participate. Your consent will be active for this visit and any virtual visit you may have with one of our providers in the next 365 days. If you have a MyChart account, a copy of this consent can be sent to you electronically.  As this is a virtual visit, video technology does not allow for your provider to perform a traditional examination. This may limit your provider's ability to fully assess your condition. If your provider identifies any concerns that need to be evaluated in person or the need to arrange testing (such as labs, EKG, etc.), we will make arrangements to do so. Although advances in technology are sophisticated, we cannot ensure that it will always work on either your end or our end. If the connection with a video visit is poor, the visit may have to be switched to a telephone visit. With either a video or telephone visit, we are not always able to ensure that we have a secure connection.  By engaging in this virtual visit, you consent to the provision of healthcare and authorize for your insurance to be billed (if applicable) for the services provided during this visit. Depending on your insurance coverage, you may receive a charge related to this service.  I need to obtain your verbal consent now. Are you willing to proceed with your visit today? Haley Freeman has provided verbal consent on 01/19/2024 for a virtual visit (video or telephone). Delon CHRISTELLA Dickinson, PA-C  Date: 01/19/2024 12:20 PM   Virtual Visit via Video Note   I, Delon CHRISTELLA Dickinson, connected with  Haley Freeman  ([id number], 1975/11/01) on 01/19/2024 at 12:00 PM EST by a video-enabled telemedicine application and verified that I am speaking with the correct person using two identifiers.  Location: Patient: Virtual Visit  Location Patient: Home Provider: Virtual Visit Location Provider: Home Office   I discussed the limitations of evaluation and management by telemedicine and the availability of in person appointments. The patient expressed understanding and agreed to proceed.    History of Present Illness: Haley Freeman is a 49 y.o. who identifies as a female who was assigned female at birth, and is being seen today for 2 complaints.  HPI: URI  This is a new problem. The current episode started in the past 7 days (Started Friday, 01/16/24). The problem has been unchanged. There has been no fever. Associated symptoms include congestion, coughing, headaches, rhinorrhea, sneezing, a sore throat (irritated and scratching) and wheezing. Pertinent negatives include no chest pain, diarrhea, ear pain, nausea, plugged ear sensation, sinus pain or vomiting. Treatments tried: hot tea, gargling listerene, antihistamines, tylenol . The treatment provided no relief.  Arm Pain  Incident onset: Saturday, 01/17/24. The incident occurred at home. Injury mechanism: lifting a suitcase. The pain is present in the right shoulder. The quality of the pain is described as aching. The pain radiates to the right arm. The pain is moderate. The pain has been Constant since the incident. Associated symptoms include tingling (mild to fingers with typing and lifting). Pertinent negatives include no chest pain, muscle weakness or numbness. The symptoms are aggravated by lifting. She has tried nothing for the symptoms. The treatment provided no relief.    Problems:  Patient Active Problem List   Diagnosis Date Noted   Healthcare maintenance 09/24/2021  Morbid obesity (HCC) 11/10/2019   GERD (gastroesophageal reflux disease) 05/17/2019   History of ETOH abuse 05/17/2019   Mixed hyperlipidemia 05/17/2019   Bipolar disorder with depression (HCC) 03/18/2019   Cyst of left ovary 09/07/2018   Gastroesophageal reflux disease with esophagitis  06/04/2018   Chronic anemia 05/04/2018   Hypokalemia 09/27/2017   Elevated LFTs 09/27/2017   Abnormal biliary HIDA scan 09/27/2017   Marijuana abuse 09/27/2017   Intractable nausea and vomiting 09/26/2017   Cannabis hyperemesis syndrome concurrent with and due to cannabis abuse 09/16/2017   Dermoid 08/22/2017    Allergies: Allergies[1] Medications: Current Medications[2]  Observations/Objective: Patient is well-developed, well-nourished in no acute distress.  Resting comfortably at home.  Head is normocephalic, atraumatic.  No labored breathing.  Speech is clear and coherent with logical content.  Patient is alert and oriented at baseline.    Assessment and Plan: 1. Viral URI with cough (Primary) - predniSONE  (STERAPRED UNI-PAK 21 TAB) 10 MG (21) TBPK tablet; 6 day taper; take as directed on a package instructions  Dispense: 21 tablet; Refill: 0 - albuterol  (VENTOLIN  HFA) 108 (90 Base) MCG/ACT inhaler; Inhale 1-2 puffs into the lungs every 6 (six) hours as needed.  Dispense: 8 g; Refill: 0 - promethazine -dextromethorphan (PROMETHAZINE -DM) 6.25-15 MG/5ML syrup; Take 5 mLs by mouth 4 (four) times daily as needed.  Dispense: 118 mL; Refill: 0  - Suspect viral URI - Symptomatic medications of choice over the counter as needed - Added Promethazine  DM for cough - Added Albuterol  for wheezing, chest tightness and shortness of breath - Prednisone  added for cough and wheeze, but also for her arm pain noted below - Push fluids - Rest - Seek further evaluation if symptoms change or worsen  2. Pain of right upper extremity - predniSONE  (STERAPRED UNI-PAK 21 TAB) 10 MG (21) TBPK tablet; 6 day taper; take as directed on a package instructions  Dispense: 21 tablet; Refill: 0  - Suspect possible biceps tendinitis and rotator cuff tendinitis - Add Prednisone  - Patient unable to take NSAIDs - Can apply icy hot or biofreeze, or other topical analgesic as needed - Warm compresses - Shoulder  exercises in AVS - School note provided - Seek in person evaluation if not improving or if worsens   Follow Up Instructions: I discussed the assessment and treatment plan with the patient. The patient was provided an opportunity to ask questions and all were answered. The patient agreed with the plan and demonstrated an understanding of the instructions.  A copy of instructions were sent to the patient via MyChart unless otherwise noted below.    The patient was advised to call back or seek an in-person evaluation if the symptoms worsen or if the condition fails to improve as anticipated.    Delon CHRISTELLA Dickinson, PA-C     [1]  Allergies Allergen Reactions   Penicillins Anaphylaxis    Swells throat. Has patient had a PCN reaction causing immediate rash, facial/tongue/throat swelling, SOB or lightheadedness with hypotension: Yes Has patient had a PCN reaction causing severe rash involving mucus membranes or skin necrosis: No Has patient had a PCN reaction that required hospitalization: Yes Has patient had a PCN reaction occurring within the last 10 years: No If all of the above answers are NO, then may proceed with Cephalosporin use.    Strawberry (Diagnostic) Shortness Of Breath  [2]  Current Outpatient Medications:    albuterol  (VENTOLIN  HFA) 108 (90 Base) MCG/ACT inhaler, Inhale 1-2 puffs into the lungs every 6 (six)  hours as needed., Disp: 8 g, Rfl: 0   predniSONE  (STERAPRED UNI-PAK 21 TAB) 10 MG (21) TBPK tablet, 6 day taper; take as directed on a package instructions, Disp: 21 tablet, Rfl: 0   promethazine -dextromethorphan (PROMETHAZINE -DM) 6.25-15 MG/5ML syrup, Take 5 mLs by mouth 4 (four) times daily as needed., Disp: 118 mL, Rfl: 0   azelastine  (ASTELIN ) 0.1 % nasal spray, Place 2 sprays into both nostrils 2 (two) times daily. Use in each nostril as directed, Disp: 30 mL, Rfl: 12   cetirizine  (ZYRTEC ) 10 MG tablet, Take 1 tablet (10 mg total) by mouth daily., Disp: 30 tablet,  Rfl: 11   cyclobenzaprine  (FLEXERIL ) 10 MG tablet, Take 1 tablet (10 mg total) by mouth 3 (three) times daily as needed for muscle spasms., Disp: 30 tablet, Rfl: 0   escitalopram  (LEXAPRO ) 10 MG tablet, TAKE 1 TABLET BY MOUTH EVERY DAY (Patient not taking: Reported on 02/05/2023), Disp: 90 tablet, Rfl: 1   fluticasone  (FLONASE ) 50 MCG/ACT nasal spray, Place 2 sprays into both nostrils daily., Disp: 16 g, Rfl: 6   levocetirizine (XYZAL ) 5 MG tablet, TAKE 1 TABLET BY MOUTH EVERY DAY IN THE EVENING, Disp: 90 tablet, Rfl: 1   lidocaine  (XYLOCAINE ) 2 % solution, Use as directed 5 mLs in the mouth or throat every 4 (four) hours as needed (sore throat)., Disp: 100 mL, Rfl: 0  "

## 2024-01-19 NOTE — Patient Instructions (Signed)
 " Haley Freeman, thank you for joining Delon CHRISTELLA Dickinson, PA-C for today's virtual visit.  While this provider is not your primary care provider (PCP), if your PCP is located in our provider database this encounter information will be shared with them immediately following your visit.   A [facility] MyChart account gives you access to today's visit and all your visits, tests, and labs performed at [facility]  click here if you don't have a [facility] MyChart account or go to [email or web address]  Consent: (Patient) Haley Freeman provided verbal consent for this virtual visit at the beginning of the encounter.  Current Medications:  Current Outpatient Medications:    albuterol  (VENTOLIN  HFA) 108 (90 Base) MCG/ACT inhaler, Inhale 1-2 puffs into the lungs every 6 (six) hours as needed., Disp: 8 g, Rfl: 0   predniSONE  (STERAPRED UNI-PAK 21 TAB) 10 MG (21) TBPK tablet, 6 day taper; take as directed on a package instructions, Disp: 21 tablet, Rfl: 0   promethazine -dextromethorphan (PROMETHAZINE -DM) 6.25-15 MG/5ML syrup, Take 5 mLs by mouth 4 (four) times daily as needed., Disp: 118 mL, Rfl: 0   azelastine  (ASTELIN ) 0.1 % nasal spray, Place 2 sprays into both nostrils 2 (two) times daily. Use in each nostril as directed, Disp: 30 mL, Rfl: 12   cetirizine  (ZYRTEC ) 10 MG tablet, Take 1 tablet (10 mg total) by mouth daily., Disp: 30 tablet, Rfl: 11   cyclobenzaprine  (FLEXERIL ) 10 MG tablet, Take 1 tablet (10 mg total) by mouth 3 (three) times daily as needed for muscle spasms., Disp: 30 tablet, Rfl: 0   escitalopram  (LEXAPRO ) 10 MG tablet, TAKE 1 TABLET BY MOUTH EVERY DAY (Patient not taking: Reported on 02/05/2023), Disp: 90 tablet, Rfl: 1   fluticasone  (FLONASE ) 50 MCG/ACT nasal spray, Place 2 sprays into both nostrils daily., Disp: 16 g, Rfl: 6   levocetirizine (XYZAL ) 5 MG tablet, TAKE 1 TABLET BY MOUTH EVERY DAY IN THE EVENING, Disp: 90 tablet, Rfl: 1   lidocaine  (XYLOCAINE )  2 % solution, Use as directed 5 mLs in the mouth or throat every 4 (four) hours as needed (sore throat)., Disp: 100 mL, Rfl: 0   Medications ordered in this encounter:  Meds ordered this encounter  Medications   predniSONE  (STERAPRED UNI-PAK 21 TAB) 10 MG (21) TBPK tablet    Sig: 6 day taper; take as directed on a package instructions    Dispense:  21 tablet    Refill:  0    Supervising Provider:   LAMPTEY, PHILIP O [[id number]]   albuterol  (VENTOLIN  HFA) 108 (90 Base) MCG/ACT inhaler    Sig: Inhale 1-2 puffs into the lungs every 6 (six) hours as needed.    Dispense:  8 g    Refill:  0    Supervising Provider:   BLAISE ALEENE KIDD [[id number]]   promethazine -dextromethorphan (PROMETHAZINE -DM) 6.25-15 MG/5ML syrup    Sig: Take 5 mLs by mouth 4 (four) times daily as needed.    Dispense:  118 mL    Refill:  0    Supervising Provider:   BLAISE ALEENE KIDD [[id number]]     *If you need refills on other medications prior to your next appointment, please contact your pharmacy*  Follow-Up: Call back or seek an in-person evaluation if the symptoms worsen or if the condition fails to improve as anticipated.  [facility] [phone]  Other Instructions Shoulder Exercises Ask your health care provider which exercises are safe for you. Do exercises exactly as told by  your health care provider and adjust them as directed. It is normal to feel mild stretching, pulling, tightness, or discomfort as you do these exercises. Stop right away if you feel sudden pain or your pain gets worse. Do not begin these exercises until told by your health care provider. Stretching exercises External rotation and abduction This exercise is sometimes called corner stretch. The exercise rotates your arm outward (external rotation) and moves your arm out from your body (abduction). Stand in a doorway with one of your feet slightly in front of the other. This is called a staggered stance. If you cannot reach your  forearms to the door frame, stand facing a corner of a room. Choose one of the following positions as told by your health care provider: Place your hands and forearms on the door frame above your head. Place your hands and forearms on the door frame at the height of your head. Place your hands on the door frame at the height of your elbows. Slowly move your weight onto your front foot until you feel a stretch across your chest and in the front of your shoulders. Keep your head and chest upright and keep your abdominal muscles tight. Hold for __________ seconds. To release the stretch, shift your weight to your back foot. Repeat __________ times. Complete this exercise __________ times a day. Extension, standing  Stand and hold a broomstick, a cane, or a similar object behind your back. Your hands should be a little wider than shoulder-width apart. Your palms should face away from your back. Keeping your elbows straight and your shoulder muscles relaxed, move the stick away from your body until you feel a stretch in your shoulders (extension). Avoid shrugging your shoulders while you move the stick. Keep your shoulder blades tucked down toward the middle of your back. Hold for __________ seconds. Slowly return to the starting position. Repeat __________ times. Complete this exercise __________ times a day. Range-of-motion exercises Pendulum  Stand near a wall or a surface that you can hold onto for balance. Bend at the waist and let your left / right arm hang straight down. Use your other arm to support you. Keep your back straight and do not lock your knees. Relax your left / right arm and shoulder muscles, and move your hips and your trunk so your left / right arm swings freely. Your arm should swing because of the motion of your body, not because you are using your arm or shoulder muscles. Keep moving your hips and trunk so your arm swings in the following directions, as told by your  health care provider: Side to side. Forward and backward. In clockwise and counterclockwise circles. Continue each motion for __________ seconds, or for as long as told by your health care provider. Slowly return to the starting position. Repeat __________ times. Complete this exercise __________ times a day. Shoulder flexion, standing  Stand and hold a broomstick, a cane, or a similar object. Place your hands a little more than shoulder-width apart on the object. Your left / right hand should be palm-up, and your other hand should be palm-down. Keep your elbow straight and your shoulder muscles relaxed. Push the stick up with your healthy arm to raise your left / right arm in front of your body, and then over your head until you feel a stretch in your shoulder (flexion). Avoid shrugging your shoulder while you raise your arm. Keep your shoulder blade tucked down toward the middle of your back. Hold  for __________ seconds. Slowly return to the starting position. Repeat __________ times. Complete this exercise __________ times a day. Shoulder abduction, standing  Stand and hold a broomstick, a cane, or a similar object. Place your hands a little more than shoulder-width apart on the object. Your left / right hand should be palm-up, and your other hand should be palm-down. Keep your elbow straight and your shoulder muscles relaxed. Push the object across your body toward your left / right side. Raise your left / right arm to the side of your body (abduction) until you feel a stretch in your shoulder. Do not raise your arm above shoulder height unless your health care provider tells you to do that. If directed, raise your arm over your head. Avoid shrugging your shoulder while you raise your arm. Keep your shoulder blade tucked down toward the middle of your back. Hold for __________ seconds. Slowly return to the starting position. Repeat __________ times. Complete this exercise __________ times  a day. Internal rotation  Place your left / right hand behind your back, palm-up. Use your other hand to dangle an exercise band, a broomstick, or a similar object over your shoulder. Grasp the band with your left / right hand so you are holding on to both ends. Gently pull up on the band until you feel a stretch in the front of your left / right shoulder. The movement of your arm toward the center of your body is called internal rotation. Avoid shrugging your shoulder while you raise your arm. Keep your shoulder blade tucked down toward the middle of your back. Hold for __________ seconds. Release the stretch by letting go of the band and lowering your hands. Repeat __________ times. Complete this exercise __________ times a day. Strengthening exercises External rotation  Sit in a stable chair without armrests. Secure an exercise band to a stable object at elbow height on your left / right side. Place a soft object, such as a folded towel or a small pillow, between your left / right upper arm and your body to move your elbow about 4 inches (10 cm) away from your side. Hold the end of the exercise band so it is tight and there is no slack. Keeping your elbow pressed against the soft object, slowly move your forearm out, away from your abdomen (external rotation). Keep your body steady so only your forearm moves. Hold for __________ seconds. Slowly return to the starting position. Repeat __________ times. Complete this exercise __________ times a day. Shoulder abduction  Sit in a stable chair without armrests, or stand up. Hold a __________ lb / kg weight in your left / right hand, or hold an exercise band with both hands. Start with your arms straight down and your left / right palm facing in, toward your body. Slowly lift your left / right hand out to your side (abduction). Do not lift your hand above shoulder height unless your health care provider tells you that this is safe. Keep your  arms straight. Avoid shrugging your shoulder while you do this movement. Keep your shoulder blade tucked down toward the middle of your back. Hold for __________ seconds. Slowly lower your arm, and return to the starting position. Repeat __________ times. Complete this exercise __________ times a day. Shoulder extension  Sit in a stable chair without armrests, or stand up. Secure an exercise band to a stable object in front of you so it is at shoulder height. Hold one end of the exercise  band in each hand. Straighten your elbows and lift your hands up to shoulder height. Squeeze your shoulder blades together as you pull your hands down to the sides of your thighs (extension). Stop when your hands are straight down by your sides. Do not let your hands go behind your body. Hold for __________ seconds. Slowly return to the starting position. Repeat __________ times. Complete this exercise __________ times a day. Shoulder row  Sit in a stable chair without armrests, or stand up. Secure an exercise band to a stable object in front of you so it is at chest height. Hold one end of the exercise band in each hand. Position your palms so that your thumbs are facing the ceiling (neutral position). Bend each of your elbows to a 90-degree angle (right angle) and keep your upper arms at your sides. Step back or move the chair back until the band is tight and there is no slack. Slowly pull your elbows back behind you. Hold for __________ seconds. Slowly return to the starting position. Repeat __________ times. Complete this exercise __________ times a day. Shoulder press-ups  Sit in a stable chair that has armrests. Sit upright, with your feet flat on the floor. Put your hands on the armrests so your elbows are bent and your fingers are pointing forward. Your hands should be about even with the sides of your body. Push down on the armrests and use your arms to lift yourself off the chair. Straighten  your elbows and lift yourself up as much as you comfortably can. Move your shoulder blades down, and avoid letting your shoulders move up toward your ears. Keep your feet on the ground. As you get stronger, your feet should support less of your body weight as you lift yourself up. Hold for __________ seconds. Slowly lower yourself back into the chair. Repeat __________ times. Complete this exercise __________ times a day. Wall push-ups  Stand so you are facing a stable wall. Your feet should be about one arm-length away from the wall. Lean forward and place your palms on the wall at shoulder height. Keep your feet flat on the floor as you bend your elbows and lean forward toward the wall. Hold for __________ seconds. Straighten your elbows to push yourself back to the starting position. Repeat __________ times. Complete this exercise __________ times a day. This information is not intended to replace advice given to you by your health care provider. Make sure you discuss any questions you have with your health care provider. Document Revised: 02/13/2021 Document Reviewed: 02/13/2021 Elsevier Patient Education  2024 [employer]   If you have been instructed to have an in-person evaluation today at a local Urgent Care facility, please use the link below. It will take you to a list of all of our available [facility], including address, phone number and hours of operation. Please do not delay care.  [facility]  If you or a family member do not have a primary care provider, use the link below to schedule a visit and establish care. When you choose a [facility] primary care physician or advanced practice provider, you gain a long-term partner in health. Find a Primary Care Provider  Learn more about [facility] in-office and virtual care options: [employer] - Get Care Now "

## 2024-01-22 ENCOUNTER — Ambulatory Visit: Payer: Self-pay
